# Patient Record
Sex: Male | Born: 1980 | Race: White | Marital: Single | Smoking: Current every day smoker
Health system: Northeastern US, Academic
[De-identification: ages and names within clinical notes are randomized; demographics above are authoritative.]

## PROBLEM LIST (undated history)

## (undated) DIAGNOSIS — R45851 Suicidal ideations: Secondary | ICD-10-CM

## (undated) DIAGNOSIS — F1020 Alcohol dependence, uncomplicated: Secondary | ICD-10-CM

## (undated) DIAGNOSIS — Z91030 Bee allergy status: Secondary | ICD-10-CM

## (undated) DIAGNOSIS — I10 Essential (primary) hypertension: Secondary | ICD-10-CM

## (undated) DIAGNOSIS — F319 Bipolar disorder, unspecified: Secondary | ICD-10-CM

## (undated) HISTORY — PX: TYMPANOPLASTY: SHX33

## (undated) HISTORY — PX: TONSILLECTOMY: SHX28A

## (undated) HISTORY — PX: TONSILLECTOMY: SUR1361

## (undated) NOTE — ED Notes (Signed)
Formatting of this note might be different from the original.  Vol/pending admit to BMU after 8:30 AM  Electronically signed by Haywood Lasso, NT at 02/17/2020  5:19 AM EDT

## (undated) NOTE — ED Notes (Signed)
Formatting of this note might be different from the original.  Awaiting orders for BMU  Electronically signed by Lambert Mody, RN at 02/17/2020  8:47 AM EDT

## (undated) NOTE — Progress Notes (Signed)
Formatting of this note might be different from the original.  Patient can admit to [facility] this evening, pending negative COVID test. Please see BH Assessment note for admission details and number to call for report.    Once COVID test results negative, RN to call [facility] for transportation after 9:30 PM. Number to Kathryne Sharper is [phone]. Sheriff must be told that patient is under IVC.    Blenda Nicely, [place]  Clinical Social Worker  [phone]    Electronically signed by Baldemar Lenis, LCSW at 07/13/2020  4:56 PM EST

## (undated) NOTE — Progress Notes (Signed)
Formatting of this note might be different from the original.  Called and updated mother Steward Drone about patient transfer to floor and restraint use  Electronically signed by Wylie Hail, RN at 07/07/2020  5:29 PM EST

## (undated) NOTE — Unmapped External Note (Signed)
Formatting of this note might be different from the original.    Problem: Safety:  Goal: Non-violent Restraint(s)  Outcome: Progressing    Problem: Education:  Goal: Knowledge of General Education information will improve  Description: Including pain rating scale, medication(s)/side effects and non-pharmacologic comfort measures  Outcome: Progressing    Problem: Health Behavior/Discharge Planning:  Goal: Ability to manage health-related needs will improve  Outcome: Progressing    Problem: Clinical Measurements:  Goal: Ability to maintain clinical measurements within normal limits will improve  Outcome: Progressing  Goal: Will remain free from infection  Outcome: Progressing  Goal: Diagnostic test results will improve  Outcome: Progressing  Goal: Respiratory complications will improve  Outcome: Progressing  Goal: Cardiovascular complication will be avoided  Outcome: Progressing    Problem: Activity:  Goal: Risk for activity intolerance will decrease  Outcome: Progressing    Problem: Nutrition:  Goal: Adequate nutrition will be maintained  Outcome: Progressing    Problem: Coping:  Goal: Level of anxiety will decrease  Outcome: Progressing    Problem: Elimination:  Goal: Will not experience complications related to bowel motility  Outcome: Progressing  Goal: Will not experience complications related to urinary retention  Outcome: Progressing    Problem: Pain Managment:  Goal: General experience of comfort will improve  Outcome: Progressing    Problem: Safety:  Goal: Ability to remain free from injury will improve  Outcome: Progressing    Problem: Skin Integrity:  Goal: Risk for impaired skin integrity will decrease  Outcome: Progressing    Problem: Education:  Goal: Expressions of having a comfortable level of knowledge regarding the disease process will increase  Outcome: Progressing    Problem: Coping:  Goal: Ability to adjust to condition or change in health will improve  Outcome: Progressing  Goal: Ability to  identify appropriate support needs will improve  Outcome: Progressing    Problem: Health Behavior/Discharge Planning:  Goal: Compliance with prescribed medication regimen will improve  Outcome: Progressing    Problem: Medication:  Goal: Risk for medication side effects will decrease  Outcome: Progressing    Problem: Clinical Measurements:  Goal: Complications related to the disease process, condition or treatment will be avoided or minimized  Outcome: Progressing  Goal: Diagnostic test results will improve  Outcome: Progressing    Problem: Safety:  Goal: Verbalization of understanding the information provided will improve  Outcome: Progressing    Problem: Self-Concept:  Goal: Level of anxiety will decrease  Outcome: Progressing  Goal: Ability to verbalize feelings about condition will improve  Outcome: Progressing    Electronically signed by Treasa School, RN at 07/08/2020  9:14 PM EST

## (undated) NOTE — ED Provider Notes (Signed)
Formatting of this note is different from the original.  Emergency Medicine Observation Re-evaluation Note    Jeffrey Reese is a 4 y.o. male, seen on rounds today.  Pt initially presented to the ED for complaints of Suicidal  Currently, the patient is sleeping.    Physical Exam   BP (!) 138/100 (BP Location: Left Arm)   Pulse (!) 106   Temp 98.5 F (36.9 C) (Oral)   Resp 16   SpO2 95%   Physical Exam  General: resting  Cardiac: well perfused  Lungs: spontaneous resp  Psych: calm    ED Course / MDM   EKG:     I have reviewed the labs performed to date as well as medications administered while in observation.  Recent changes in the last 24 hours include none.    Plan   Current plan is for psych/sw/cm dispo.  Patient is under full IVC at this time.    Willy Eddy, MD  02/16/20 [phone]    Electronically signed by Willy Eddy, MD at 02/16/2020  7:19 AM EDT

## (undated) NOTE — Progress Notes (Signed)
Formatting of this note might be different from the original.  Patient's MRSA PCR came back positive. Mupirocin ordered.  Electronically signed by Cyril Loosen, RN at 10/20/2020  5:42 PM EDT

## (undated) NOTE — Progress Notes (Signed)
Formatting of this note might be different from the original.  Pt has been discharged from unit via [employer]. PT has all belongings sent with him and has the disposable scrubs on as well. AVS was given and reviewed with the pt.   Electronically signed by Vanessa Barbara, RN at 07/14/2020 11:12 AM EST

## (undated) NOTE — ED Notes (Signed)
Formatting of this note might be different from the original.  Radiology at bedside.   Electronically signed by Oletta Darter, RN at 10/19/2020  5:09 PM EDT

## (undated) NOTE — ED Notes (Signed)
Formatting of this note might be different from the original.  Pt refused snack.  Electronically signed by Rolla Plate, NT at 02/16/2020  9:16 PM EDT

## (undated) NOTE — ED Notes (Signed)
Formatting of this note might be different from the original.  Poison control called for update, repeat EKG obtained and noted improved QTC. Recommends maybe a CK level, but kidney function is reassuring.   Electronically signed by Forbes Cellar, RN at 07/06/2020  5:32 PM EST

## (undated) NOTE — Unmapped External Note (Signed)
Formatting of this note is different from the original.     Jeffrey Reese is a 76 y.o. male past medical history significant for hypertension, depression/anxiety with history of suicidal ideation, alcohol dependence, tobacco dependence presented with excessive movements of extremities with concern for seizures.  Patient overdosed on unknown amount of Seroquel +/-BuSpar, found facedown at hotel room at approx 0700.  EMS gave Narcan w/no change.  Patient blood pressure has been running high since admission with systolic of 102-152 and diastolic of 80 to 120 mmHg.  Patient is tachycardic with pulse rate ranging from 106-127/minute.  He is afebrile and satting well at room air. CT head negative for acute intracranial pathology.  Ethyl alcohol level is high at 96.  AST and ALT mildly elevated at 53 and 64 respectively.  Acetaminophen level <10.  QTc mildly elevated at 486. Pt has alcohol use disorder and was admitted for alcohol withdrawal recently.    Psych consult placed for suicide attempt. As noted above patient presented yesterday after suicide attempt. He allegedly contacted his grandmother prior to the attempt and told her goodbye.  Patient has a psychiatric history of depression and bipolar, severe alcohol use disorder. His history is notable for DT, most recently as 2020 when he was admitted to [facility] for treatment of DTs. Patient has recent ER visit in 01/2020 where he endorsed suicidal ideations and depression. He has an extensive history of alcohol use for over 20 years drinking about 12-15 beers a day. He is currently receiving services at [facility] outpatient. There are no known records of suicide attempt at this time.     Patient continues to be somnolent and difficult to arouse. He is unable to participate in psychiatric evaluation at this time. Will attempt to reassess patient tomorrow. Patient at high risk for DT's at this time, recommend continue CIWA protocol at this time.     - Due to his overdose on  antipsychotic and Buspar.  will avoid any additional antipsychotics at this time. Patient may require other sedatives and chemical restraints for agitation and aggression.     -Patient with high risk for suicide completion will benefit from inpatient admission for crisis stabilization and medication management.   Patient continues to meet criteria for IVC as he is a risk to self ( suicide attempt).     -Patent attorney.     -Recommend inpatient admission. Work closely with SW to get patient placed at inpatient psych facility once he is medically stable.     -  Electronically signed by Nelly Rout, MD at 07/08/2020 10:27 AM EST    Associated attestation - Nelly Rout, MD - 07/08/2020 10:27 AM EST  Formatting of this note might be different from the original.  Patient needs inpatient psychiatric admission for stabilization and treatment

## (undated) NOTE — ED Notes (Signed)
Formatting of this note might be different from the original.  Unable to sign MSE due to unresponsive.   Electronically signed by Oletta Darter, RN at 10/19/2020  4:31 PM EDT

## (undated) NOTE — Unmapped External Note (Signed)
Formatting of this note is different from the original.  Images from the original note were not included.      NAME:  Jeffrey Reese, MRN:  [id number], DOB:  10-Nov-1980, LOS: 0  ADMISSION DATE:  07/06/2020, CONSULTATION DATE:  07/06/20  REFERRING MD:  Ophelia Charter - [facility] , CHIEF COMPLAINT:  Encephalopathy // intentional overdose     Brief History:   53 yo with MDD + SI in ED following intentional ingestion of 6g of Seroquel.    History of Present Illness:   79 yo M PMH MDD with SI, HTN, EtOH use disorder, Tobacco use disorder. Found down in hotel room 07/06/20 0700, had involuntary movements of extremities, encephalopathy. Per patient's mother, pt left communication indicative of SI, and took 6g Seroquel (possible co BusPar ingestion as well).   Pt with no response to narcan with EMS. In ED, CT H unremarkable for acute intracranial process. Ethyl alcohol 96. APAP < 10. QTc 486. Normotensive, tachycardic with HR 120s    Poison Control engaged - rec IVF, cardiac monitoring, optimize mag and K, avoidance of antipsychotics    Neurology consulted and recommend critical care consultation     Past Medical History:   MDD with SI  EtOH abuse  Tobacco use disorder  HTN     Significant Hospital Events:   1/11 ED for encephalopathy after being found in hotel room following intentional seroquel ingestion (6g) in apparent Suicide attempt. [facility] consulted for admit. Neuro consulted for recs. PCCM consulted     Consults:   PCCM  Neuro    Procedures:     Significant Diagnostic Tests:   1/11 CT H> no acute intracranial abnormality  1/11 EEG >>>     Micro Data:     1/11 SARS Cov2> neg   Antimicrobials:     Interim History / Subjective:   Seen by neurology   [facility] has been able to contact pts mother     Objective    Blood pressure (!) 144/94, pulse (!) 127, temperature (!) 96.7 F (35.9 C), temperature source Axillary, resp. rate 18, height 5\' 10"  (1.778 m), weight 95.3 kg, SpO2 100 %.      No intake or output data in the 24 hours ending 07/06/20  1645  Filed Weights    07/06/20 1259   Weight: 95.3 kg     Examination:  General: Acutely and chronically ill appearing adult M supine NAD   HENT: Nasal trumpet. Tacky mm. Flushed face. Mildly injected sclera, anicteric  Lungs: Symmetrical chest expansion, even. No accessory use. CTAb  Cardiovascular: tachycardic rate reg rhythm cap refill brisk   Abdomen: Soft round + bowel sounds  Extremities: No obvious joint deformity no cyanosis or clubbing no edema  Neuro: Awakens to noxious stimuli. Follows simple commands. Mild hypertonicity BUE. Lower extremity reflexia difficult to discern due to pt spontaneous movement. Protecting airway. PERRL 3mm. Akasthesias  GU: condom cath     [facility] Problem list      Assessment & Plan:     MDD with SI  -home meds: depakote ER 500mg , buspar 10mg  BID, lexapro 20mg , seroquel 200mg   P  -holding home meds  -needs psych consult    Acute toxic metabolic encephalopathy in setting of intentional overdose:  Seroquel overdose, intentional  -is possible that pt also ingested other home meds   -sx mostly c/w EPS -- not currently c/w NMS  -not c/w anticholinergic tox, serotonin syndrome at this time  P  -poison control engaged in  ED  -Neuro consulted, recs are pending  -cont IVF -- I have ordered additional bolus but anticipate pt will need more aggressive ongoing IVF  -cardiac monitoring, cont QTc monitoring, monitor for airway protection, serial ECGs   -optimize mag, K, calcium -- I will add on an ionized cal to these labs  -check CK, LDH  -benadryl PRN for extrapyramidal symptoms   -monitor for s/sx NMS  -check VPA level   -if QRS prolonged, could consider bicarb gtt, but would rec discussing with poison control first     Bibasilar pulmonary opacities  -infiltrates vs atelectasis on CXR  -clinically, very possible that pt aspirated -- rec short course of empiric abx     Transaminitis, mild  -in setting of etoh use disorder vs overdose above  P  -trend LFTs    EtOH abuse  disorder  P  -CIWA  -cessation counseling when appropriate     Thank you for consulting critical care. At this time the patient remains stable for non-ICU admission. PCCM will sign off. Please re-engage if we can be of further assistance or if patient's clinical status changes.    Best practice (evaluated daily)   Diet: NPO  Pain/Anxiety/Delirium protocol (if indicated): na  VAP protocol (if indicated): na  DVT prophylaxis: lovenox  GI prophylaxis: na  Glucose control: SSI  Mobility: BR  Disposition:SDU     Goals of Care:   Last date of multidisciplinary goals of care discussion:--  Family and staff present: --  Summary of discussion: --  Follow up goals of care discussion due: --  Code Status: Full    Labs    CBC:  Recent Labs   Lab 07/06/20  1307 07/06/20  1438   WBC 9.5  --    NEUTROABS 6.6  --    HGB 14.8 15.0   HCT 44.3 44.0   MCV 93.9  --    PLT 166  --      Basic Metabolic Panel:  Recent Labs   Lab 07/06/20  1307 07/06/20  1438   NA 135 135   K 3.8 3.6   CL 98  --    CO2 23  --    GLUCOSE 144*  --    BUN 6  --    CREATININE 0.72  --    CALCIUM 9.2  --      GFR:  Estimated Creatinine Clearance: 143.6 mL/min (by C-G formula based on SCr of 0.72 mg/dL).  Recent Labs   Lab 07/06/20  1307   WBC 9.5     Liver Function Tests:  Recent Labs   Lab 07/06/20  1307   AST 70*   ALT 70*   ALKPHOS 67   BILITOT 1.2   PROT 6.9   ALBUMIN 3.8     No results for input(s): LIPASE, AMYLASE in the last 168 hours.  No results for input(s): AMMONIA in the last 168 hours.    ABG    Component Value Date/Time    PHART 7.343 (L) 07/06/2020 1438    PCO2ART 43.0 07/06/2020 1438    PO2ART 73 (L) 07/06/2020 1438    HCO3 23.5 07/06/2020 1438    TCO2 25 07/06/2020 1438    ACIDBASEDEF 3.0 (H) 07/06/2020 1438    O2SAT 94.0 07/06/2020 1438       Coagulation Profile:  No results for input(s): INR, PROTIME in the last 168 hours.    Cardiac Enzymes:  No results for input(s): CKTOTAL, CKMB, CKMBINDEX, TROPONINI in  the last 168 hours.    HbA1C:  No  results found for: HGBA1C    CBG:  No results for input(s): GLUCAP in the last 168 hours.    Review of Systems:    Unable to obtain due to encephalopathy     Past Medical History:   He,  has a past medical history of Alcohol dependence (HCC), Bee sting allergy, Hypertension, and Suicidal ideation.     Surgical History:     Past Surgical History:   Procedure Laterality Date   ? TONSILLECTOMY         Social History:    reports that he has been smoking. He has never used smokeless tobacco. He reports current alcohol use of about 24.0 standard drinks of alcohol per week. He reports that he does not use drugs.     Family History:   His family history is not on file.     Allergies  Allergies   Allergen Reactions   ? Bee Venom Anaphylaxis       Home Medications   Prior to Admission medications    Medication Sig Start Date End Date Taking? Authorizing Provider   amLODipine (NORVASC) 10 MG tablet Take 10 mg by mouth daily.    [provider]   busPIRone (BUSPAR) 10 MG tablet Take 10 mg by mouth 2 (two) times daily. 06/17/20   [provider]   divalproex (DEPAKOTE ER) 500 MG 24 hr tablet Take 500 mg by mouth at bedtime. 06/17/20   [provider]   EPINEPHrine 0.3 mg/0.3 mL IJ SOAJ injection Inject 0.3 mg into the muscle as needed for anaphylaxis.    [provider]   escitalopram (LEXAPRO) 20 MG tablet Take 1 tablet by mouth daily.  06/27/18   [provider]   lisinopril (ZESTRIL) 10 MG tablet Take 1 tablet by mouth daily. 04/16/18   [provider]   naproxen (NAPROSYN) 500 MG tablet Take 1 tablet (500 mg total) by mouth 2 (two) times daily with a meal. 12/09/19   Joni Reining, PA-C   QUEtiapine (SEROQUEL) 200 MG tablet Take 200 mg by mouth at bedtime. 06/17/20   [provider]       Tessie Fass MSN, AGACNP-BC  [facility]  [phone]  If no answer, [phone]  07/06/2020, 5:25 PM      Electronically signed by Karren Burly, MD at 07/06/2020  6:23 PM EST    Associated attestation - Judeth Horn Lesia Sago, MD - 07/06/2020  6:23 PM EST  Formatting of this note might be different from the original.  Critical care attending attestation note:    Patient seen and examined and relevant ancillary tests reviewed.  I agree with the assessment and plan of care as outlined by Tessie Fass, NP. The following reflects my independent care time.    13 year old presumed intentional seroquel and buspar OD whom we are seeing to evaluate mental status if protecting airway.    Follows commands with coaching. Attempts to communicate, speech garbled. Appears to be protecting airway at current time.    BP (!) 144/94   Pulse (!) 127   Temp (!) 96.7 F (35.9 C) (Axillary) Comment (Src): patient responded to probe under arm.   Resp 18   Ht 5\' 10"  (1.778 m)   Wt 95.3 kg   SpO2 100%   BMI 30.13 kg/m   General: disheveled, in mild distress  Ext: multiple abrasions, bruises  CV: tachy,  no murmur  Pulm:CTAB, NWOB on RA  Neuro: follows commands, agitated, dysarthric speech    Synopsis of assessment and plan:  Suicide attempt: Buspar and seroquel empty bottles per report  --Psych c/s    Acute toxic metabolic encephalopathy: In setting of OD, EtOH present on blood work, UDS not collected.  --Continue poison control consultation  --Monitor for Qtc prolongation  --optimize K, Mag, Ca  -if QRS prolonged, could consider bicarb gtt, but would rec discussing with poison control first     Rest per note below.    CRITICAL CARE  N/a  .    07/06/2020, 6:05 PM

## (undated) NOTE — ED Notes (Signed)
Formatting of this note might be different from the original.  Pt states his last alcoholic drink (beer) was just before he came in (he arrived at 10 am).  He reports drinking daily.  He states he requires at least a six pack every day to keep from going into withdrawals.    Electronically signed by Ancil Boozer, RN at 02/15/2020  6:38 PM EDT

## (undated) NOTE — Unmapped External Note (Signed)
Formatting of this note is different from the original.  [facility] Face-to-Face Psychiatry Consult     Reason for Consult: Suicide attempt  Referring Physician:  Dr. Ella Jubilee  Patient Identification: Jeffrey Reese  MRN:  [id number]  Principal Diagnosis: Intentional overdose of drug in tablet form [facility])  Diagnosis:  Principal Problem:    Intentional overdose of drug in tablet form (HCC)  Active Problems:    Major depressive disorder, recurrent severe without psychotic features (HCC)    Alcohol use disorder, severe, dependence (HCC)    Essential hypertension    Tobacco dependence    Class 1 obesity due to excess calories with body mass index (BMI) of 30.0 to 30.9 in adult    Total Time spent with patient: 45 minutes    Subjective:    Jeffrey Reese is a 67 y.o. male patient admitted with suicide attempt.  Patient seen and evaluated by this provider.  He presented to the emergency department, after being found down at the hotel.  Patient endorses feelings of sadness, hopelessness, helplessness, and worthlessness " I do not have anything for myself.  It is a bunch of chaos.  I have been through multiple jobs, failed relationships, it is my father's 1 year anniversary of his death, homeless, and no car."  Patient reports his depression is worse with increase in alcohol use, which led to his most recent suicide attempt.  He states after being intoxicated, he decided to take between" 25-30 buspirone, and took 2 Seroquel tablets.  I took the Seroquel as prescribed.  And I just said fuck it."  When assessing for psychiatric history he reports a diagnosis of bipolar manic depression.  He reports he is [facility] with [name], and receiving therapeutic services from [facility].  He reports numerous attempts at suicide " too many to count".  He reports 2 previous suicide attempts by overdose that resulted in medical intervention.  He reports 5-6 previous inpatient hospitalizations,  with the last hospitalization being in August 2021 where he was admitted inpatient in the [place].  He continues to endorse suicidal thoughts at this time.  Patient continues to have morbid depressive symptoms, with no support system, and remains hopeless we will continue to recommend inpatient admission at this time.    HPI: Jeffrey Reese a 39 y.o.malepast medical history significant for hypertension, depression/anxiety with history of suicidal ideation, alcohol dependence, tobacco dependence presented with excessive movements of extremitieswithconcern for seizures.Patient overdosed on unknown amount of Seroquel +/-BuSpar,found facedown at hotel room atapprox 0700.EMS gave Narcan w/nochange.Patient blood pressure has been running high since admission with systolic of 102-152 and diastolic of 80 to 120 mmHg.Patient is tachycardic with pulse rate ranging from 106-127/minute.He is afebrile and satting well at room air.CT head negative for acute intracranial pathology.Ethyl alcohol level is high at 96.AST andALT mildly elevated at 53 and 64 respectively.Acetaminophen level <10.QTc mildly elevated at 486. Pt has alcohol use disorder and was admitted for alcohol withdrawal recently.    Past Psychiatric History: See above  Legal charges: Pending for assault by pointing a weapon, carrying concealed weapon with no license, carrying concealed weapon while intoxicated with no license, DWI, communicating threat, open container, and driving without taillight.     Risk to Self:  Yes  Risk to Others:  No  Prior Inpatient Therapy:  Multiple inpatient psychiatric [facility], [facility]  Prior Outpatient Therapy:  [facility] [place].  Past Medical History:   Past Medical History:   Diagnosis Date   ? Alcohol dependence (HCC)    ? 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 Hypertension    ? Suicidal ideation      Past Surgical  History:   Procedure Laterality Date   ? TONSILLECTOMY       Family History: No family history on file.  Family Psychiatric  History: As per patient father diagnosed with" paranoid delusion disorder."   Patient also reports family history maternal and paternal of depression.  Social History:   Social History     Substance and Sexual Activity   Alcohol Use Yes   ? Alcohol/week: 24.0 standard drinks   ? Types: 24 Cans of beer per week     Social History     Substance and Sexual Activity   Drug Use No     Social History     Socioeconomic History   ? Marital status: Single     Spouse name: Not on file   ? Number of children: Not on file   ? Years of education: Not on file   ? Highest education level: Not on file   Occupational History   ? Not on file   Tobacco Use   ? Smoking status: Current Every Day Smoker   ? Smokeless tobacco: Never Used   Substance and Sexual Activity   ? Alcohol use: Yes     Alcohol/week: 24.0 standard drinks     Types: 24 Cans of beer per week   ? Drug use: No   ? Sexual activity: Not on file   Other Topics Concern   ? Not on file   Social History Narrative   ? Not on file     Social Determinants of Health     Financial Resource Strain: Not on file   Food Insecurity: Not on file   Transportation Needs: Not on file   Physical Activity: Not on file   Stress: Not on file   Social Connections: Not on file     Additional Social History:      Allergies:    Allergies   Allergen Reactions   ? Bee Venom Anaphylaxis     Labs:   Results for orders placed or performed during the hospital encounter of 07/06/20 (from the past 48 hour(s))   Valproic acid level     Status: Abnormal    Collection Time: 07/07/20  4:30 PM   Result Value Ref Range    Valproic Acid Lvl <10 (L) 50.0 - 100.0 ug/mL     Comment: RESULTS CONFIRMED BY MANUAL DILUTION  Performed at [facility], [address], [place], [place] [address]    Comprehensive metabolic panel     Status: Abnormal    Collection Time: 07/08/20  3:24 AM    Result Value Ref Range    Sodium 137 135 - 145 mmol/L    Potassium 3.3 (L) 3.5 - 5.1 mmol/L    Chloride 101 98 - 111 mmol/L    CO2 24 22 - 32 mmol/L    Glucose, Bld 96 70 - 99 mg/dL     Comment: Glucose reference range applies only to samples taken after fasting for at least 8 hours.    BUN <5 (L) 6 - 20 mg/dL    Creatinine, Ser 1.19 0.61 - 1.24 mg/dL    Calcium 8.9 8.9 - 14.7 mg/dL  Total Protein 6.1 (L) 6.5 - 8.1 g/dL    Albumin 3.3 (L) 3.5 - 5.0 g/dL    AST 65 (H) 15 - 41 U/L    ALT 69 (H) 0 - 44 U/L    Alkaline Phosphatase 64 38 - 126 U/L    Total Bilirubin 1.4 (H) 0.3 - 1.2 mg/dL    GFR, Estimated >16 >10 mL/min     Comment: (NOTE)  Calculated using the CKD-EPI Creatinine Equation (2021)     Anion gap 12 5 - 15     Comment: Performed at [facility], [address], [place], [place] [address]   Basic metabolic panel     Status: Abnormal    Collection Time: 07/09/20  4:13 AM   Result Value Ref Range    Sodium 139 135 - 145 mmol/L    Potassium 3.9 3.5 - 5.1 mmol/L    Chloride 105 98 - 111 mmol/L    CO2 24 22 - 32 mmol/L    Glucose, Bld 110 (H) 70 - 99 mg/dL     Comment: Glucose reference range applies only to samples taken after fasting for at least 8 hours.    BUN 7 6 - 20 mg/dL    Creatinine, Ser 4.09 0.61 - 1.24 mg/dL    Calcium 9.2 8.9 - 81.1 mg/dL    GFR, Estimated >91 >47 mL/min     Comment: (NOTE)  Calculated using the CKD-EPI Creatinine Equation (2021)     Anion gap 10 5 - 15     Comment: Performed at [facility], [address], [place], [place] [address]   Magnesium     Status: None    Collection Time: 07/09/20  4:13 AM   Result Value Ref Range    Magnesium 2.2 1.7 - 2.4 mg/dL     Comment: Performed at [facility], [address] [place], [place] [address]     Current Facility-Administered Medications   Medication Dose Route Frequency Provider Last Rate Last Admin   ? acetaminophen (TYLENOL) tablet 650 mg  650 mg Oral Q6H PRN Jonah Blue, MD        Or   ? acetaminophen  (TYLENOL) suppository 650 mg  650 mg Rectal Q6H PRN Jonah Blue, MD       ? amLODipine (NORVASC) tablet 10 mg  10 mg Oral Daily Coralie Keens, MD   10 mg at 07/09/20 6578   ? bisacodyl (DULCOLAX) EC tablet 5 mg  5 mg Oral Daily PRN Jonah Blue, MD       ? dextrose 5 % in lactated ringers infusion   Intravenous Continuous Arrien, York Ram, MD 75 mL/hr at 07/08/20 2240 New Bag at 07/08/20 2240   ? enoxaparin (LOVENOX) injection 40 mg  40 mg Subcutaneous Q24H Jonah Blue, MD   40 mg at 07/08/20 1704   ? [START ON 07/10/2020] folic acid (FOLVITE) tablet 1 mg  1 mg Oral Daily Arrien, York Ram, MD       ? hydrALAZINE (APRESOLINE) injection 5 mg  5 mg Intravenous Q4H PRN Jonah Blue, MD   5 mg at 07/07/20 2042   ? lisinopril (ZESTRIL) tablet 10 mg  10 mg Oral Daily Arrien, York Ram, MD   10 mg at 07/09/20 0956   ? LORazepam (ATIVAN) injection 0-4 mg  0-4 mg Intravenous Bobette Mo, MD       ? LORazepam (ATIVAN) tablet 1-4 mg  1-4 mg Oral Q1H PRN  Jonah Blue, MD        Or   ? LORazepam (ATIVAN) injection 1-4 mg  1-4 mg Intravenous Q1H PRN Jonah Blue, MD   2 mg at 07/07/20 2254   ? morphine 2 MG/ML injection 2 mg  2 mg Intravenous Q2H PRN Jonah Blue, MD   2 mg at 07/08/20 0158   ? multivitamin with minerals tablet 1 tablet  1 tablet Oral Daily Jonah Blue, MD   1 tablet at 07/09/20 1610   ? naproxen (NAPROSYN) tablet 500 mg  500 mg Oral BID WC Coralie Keens, MD   500 mg at 07/09/20 0956   ? nicotine (NICODERM CQ - dosed in mg/24 hours) patch 14 mg  14 mg Transdermal q1800 Jonah Blue, MD   14 mg at 07/08/20 1705   ? ondansetron (ZOFRAN) tablet 4 mg  4 mg Oral Q6H PRN Jonah Blue, MD        Or   ? ondansetron [facility]) injection 4 mg  4 mg Intravenous Q6H PRN Jonah Blue, MD       ? polyethylene glycol (MIRALAX / GLYCOLAX) packet 17 g  17 g Oral Daily PRN Jonah Blue, MD       ? sodium chloride flush (NS) 0.9 % injection 3 mL  3  mL Intravenous Q12H Jonah Blue, MD   3 mL at 07/09/20 9604     Musculoskeletal:  Strength & Muscle Tone: decreased  Gait & Station: normal  Patient leans: N/A    Psychiatric Specialty Exam:  Physical Exam  Vitals and nursing note reviewed.   Constitutional:       Appearance: Normal appearance.   HENT:      Head: Normocephalic.      Nose: Nose normal.   Musculoskeletal:         General: Normal range of motion.      Cervical back: Normal range of motion.   Neurological:      General: No focal deficit present.      Mental Status: He is alert and oriented to person, place, and time.   Psychiatric:         Attention and Perception: Perception normal. He is inattentive.         Mood and Affect: Mood is anxious and depressed.         Speech: Speech normal.         Behavior: Behavior normal. Behavior is cooperative.         Thought Content: Thought content includes suicidal ideation. Thought content includes suicidal plan.         Cognition and Memory: Cognition and memory normal.         Judgment: Judgment normal.       Review of Systems   Psychiatric/Behavioral: Positive for dysphoric mood and suicidal ideas. The patient is nervous/anxious.    All other systems reviewed and are negative.    Blood pressure (!) 139/105, pulse 100, temperature 98.6 F (37 C), temperature source Oral, resp. rate 20, height 5\' 10"  (1.778 m), weight 95.3 kg, SpO2 98 %.Body mass index is 30.13 kg/m.   General Appearance: Fairly Groomed   Eye Contact:  Fair   Speech:  Clear and Coherent and Normal Rate   Volume:  Normal   Mood:  Depressed, " matter of fact"   Affect:  Blunt and Depressed   Thought Process:  Linear and Descriptions of Associations: Circumstantial   Orientation:  Full (Time, Place, and Person)   Thought Content:  Logical   Suicidal Thoughts:  Yes.  with intent/plan   Homicidal Thoughts:  No   Memory:  Immediate;   Fair  Recent;   Fair  Remote;   Fair   Judgement:  Poor   Insight:  Shallow   Psychomotor Activity:  Psychomotor  Retardation   Concentration:  Concentration: Fair and Attention Span: Fair   Recall:  [employer] of Knowledge:  Fair   Language:  Good   Akathisia:  No   Handed:  Right   AIMS (if indicated):      Assets:  Housing  Leisure Time  Physical Health  Resilience   ADL's:  Intact   Cognition:  WNL   Sleep:        Treatment Plan Summary:  Plan Continue current medications.  Recommend working closely with social work to facilitate inpatient psychiatric admission.  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   -Continue CIWA detox protocol  -Patient reports two pack per day smoking history.  Increase nicotine patch to 21 mg.  -Continue IVC due to suicide attempt.  There continues to be ongoing safety concerns therefore he continues to meet criteria.    Alcohol use disorder, severe:  -Ativan alcohol detox started    Disposition: Recommend psychiatric Inpatient admission when medically cleared.    Maryagnes Amos, FNP  07/09/2020 11:56 AM  Electronically signed by Nelly Rout, MD at 07/10/2020 10:27 AM EST    Associated attestation - Nelly Rout, MD - 07/10/2020 10:27 AM EST  Formatting of this note might be different from the original.  Patient needs inpatient psychiatric care for stabilization and treatment, to continue IVC as patient did attempt suicide

## (undated) NOTE — ED Notes (Signed)
Formatting of this note might be different from the original.  Pt reports he received a call that his landlord is evicting him. Pt is asking to be discharged. Pt reports he wants the help but he cannot stay here and end up losing all his stuff. Requesting to speak with psych MD, advised writer will let MD know. He verbalized understanding.   Electronically signed by Lambert Mody, RN at 02/17/2020 11:30 AM EDT

## (undated) NOTE — Progress Notes (Signed)
Formatting of this note might be different from the original.  This RN received a phone call from Officer [name] that pt will not be picked up tonight, rather pt will be [facility] as the facility does not accept patients later than 7pm in [facility], same related to pt's RN Darral Dash, IVC papers in pt's chart. [place], Philomena Efe      Electronically signed by Iran Planas, RN at 07/13/2020 11:10 PM EST

## (undated) NOTE — Progress Notes (Signed)
Formatting of this note might be different from the original.  Pt agitated and expresses concern that he wants to leave. Nurse educated pt that he cannot leave he is waiting on bed placement at [facility]. Pt states he understands and is visibly upset.    Electronically signed by Demetrio Lapping, LPN at 16/03/9603  6:18 PM EDT

## (undated) NOTE — ED Triage Notes (Signed)
Formatting of this note might be different from the original.  Pt presents via [place] with c/o SI. Reports wants to kill himself. Pt denies details of having a plan 'because im not in my kitchen" Pt voluntary.   Electronically signed by Leana Roe, RN at 02/15/2020 10:21 AM EDT

## (undated) NOTE — ED Notes (Signed)
Formatting of this note might be different from the original.  [employer] called by Gillian Shields PA.   Electronically signed by Oletta Darter, RN at 10/19/2020  5:07 PM EDT

## (undated) NOTE — Unmapped External Note (Signed)
Formatting of this note might be different from the original.  Patient can come after 9:30pm pending negative COVID    Please be sure to discharge pt before arrival     Patient has been accepted to [facility]   Accepting physician is Dr. Neale Burly.   Attending Physician will be Dr. Neale Burly.   Patient has been assigned to room 320, by [facility] Charge Nurse [place].    Call report to [phone].   Representative/Transfer Coordinator is [place]   Patient pre-admitted by [facility])    [facility] ER Staff Caryn Bee, FNP & Josie Dixon., LCSW ) made aware of acceptance.    Electronically signed by Opal Sidles, LCSWA at 07/13/2020  3:39 PM EST

## (undated) NOTE — ED Notes (Signed)
Formatting of this note might be different from the original.  [place] [employer] at bedside, states was called out for overdose from girlfriend. Pt had admitted to taking Kratom, drinking beer and taking Seroquel. Pt was uncooperative at the house and not wanting to come to ED for evaluation, [employer] was going to bring pt here for emergency commitment and went to place cuff on patient, patient went unresponsive.   Electronically signed by Oletta Darter, RN at 10/19/2020  5:30 PM EDT

## (undated) NOTE — Progress Notes (Signed)
Formatting of this note is different from the original.  Images from the original note were not included.    PROGRESS NOTE    Jeffrey Reese  LKG:[id number] DOB: 01/31/1981 DOA: 07/06/2020  PCP: Patient, No Pcp Per     Brief Narrative:   [name] was admitted to the hospital with a working diagnosis of intentional overdose with quetiapine/ buspirone, complicated with extrapyramidal and anticholinergic syndrome.    4 year old male with past medical history for hypertension, depression, anxiety, alcohol abuse and tobacco dependence who presents after an intentional overdose.  He attempted suicide via overdose on 01/09, he called his mother to say goodbye.  The following day he was delusional and disoriented.  01/11 he was found down at the hotel room around 7 in the morning, apparently overdosed with unknown amount of quetiapine +/-buspiron he he received naloxone by EMS with no significant change.  He was brought to the hospital for further evaluation.  On his initial physical examination blood pressure 123/112, heart rate 123, respirate 16, oxygen saturation 99%, lungs clear to auscultation bilaterally, heart S1-S2, present, tachycardic, abdomen soft, no lower extremity edema.    Chest radiograph with bibasilar atelectasis, no frank infiltrates.     EKG 120 bpm, normal axis, normal intervals, sinus rhythm, no st segment or T wave changes.     Assessment & Plan:    Principal Problem:    Intentional overdose of drug in tablet form (HCC)  Active Problems:    Major depressive disorder, recurrent severe without psychotic features (HCC)    Alcohol use disorder, severe, dependence (HCC)    Essential hypertension    Tobacco dependence    Class 1 obesity due to excess calories with body mass index (BMI) of 30.0 to 30.9 in adult      1. Suicidal attempt, quetiapine and buspirone overdose, complicated with extrapyramidal syndrome and anticholinergic syndrome. EEG with no seizures.   This am is more awake and alert,  continue to have tremors and dyskinesia but improved from yesterday. Patient is under IVC.     Attempt to remove physical restrains and advance diet to regular.   Continue close neuro checks and one to one precautions, due to suicidal attempt.     Continue with IV fluids with dextrose, continue with multivitamins including thiamine (high dose).     Psychiatry has recommended inpatient psych, will follow with social work for placement.   For now will hold on divalproex, buspirone, escitalopram and quetiapine.     2. HTN. Blood pressure 155/107 mmHg. Patient has been on 4 point restrains.  At home patient on lisinopril and amlodipine that will be resumed. Continue blood pressure monitoring.     3. Tobacco and alcohol dependence.   Mentation has improved, positive tremors in his hands.   LFT's with liver enzymes trending down.    Neuro checks q 4 h. Continue with needed lorazepam per CIWA protocol.   Advance diet, continue vitamins and thiamine.     4. Hypomagnesemia/ hypokalemia. Renal function stable with serum cr at 0,63, K is 3,3 and bicarbonate at 24.  Continue hydration with balanced electrolyte solutions with dextrose. Add 40 meq Kcl IV and follow up renal panel in am.     5. Obesity type 1/ chronic pain. Calculated BMI is 30,1. Continue to be at high risk for inpatient complications.  Consult PT and OT   Resume naproxen and continue with as needed morphine.     Patient continue to be at high risk  for worsening encephalopathy     Status is: Inpatient    Remains inpatient appropriate because:IV treatments appropriate due to intensity of illness or inability to take PO    Dispo: The patient is from: Home               Anticipated d/c is to: Inpatient psych                Anticipated d/c date is: 2 days               Patient currently is not medically stable to d/c.    DVT prophylaxis: Enoxaparin    Code Status:   full   Family Communication:  I spoke over the phone with the patient's mother about patient's   condition, plan of care, prognosis and all questions were addressed.    Consultants:    Neurology    Psychiatry         Subjective:  Patient is more awake today, no nausea or vomiting, positive appetite and asking for nicotine patch.     Objective:  Vitals:    07/08/20 0000 07/08/20 0430 07/08/20 0805 07/08/20 1151   BP: 133/87 103/74 (!) 155/107 (!) 148/105   Pulse: (!) 110 (!) 101 98 100   Resp: 20  20 20    Temp: 98 F (36.7 C) 98.2 F (36.8 C) 98.1 F (36.7 C) 98.1 F (36.7 C)   TempSrc: Oral Oral Oral Oral   SpO2: 99% 98% 98% 98%   Weight:       Height:         Intake/Output Summary (Last 24 hours) at 07/08/2020 1246  Last data filed at 07/08/2020 0600  Gross per 24 hour   Intake 898.52 ml   Output 1650 ml   Net -751.48 ml     Filed Weights    07/06/20 1259   Weight: 95.3 kg     Examination:     General: Not in pain or dyspnea, deconditioned   Neurology: Awake and alert, non focal, he is orientated times place and person. Positive tremors bilateral hands at rest. Mild dyskinesia.   E ENT: no pallor, no icterus, oral mucosa moist  Cardiovascular: No JVD. S1-S2 present, rhythmic, no gallops, rubs, or murmurs. No lower extremity edema.  Pulmonary: positive breath sounds bilaterally, with no wheezing, rhonchi or rales.  Gastrointestinal. Abdomen soft and non tender  Skin. Multiple ecchymosis upper and lower extremities.   Musculoskeletal: no joint deformities    Data Reviewed: I have personally reviewed following labs and imaging studies    CBC:  Recent Labs   Lab 07/06/20  1307 07/06/20  1438 07/07/20  0500   WBC 9.5  --  9.9   NEUTROABS 6.6  --   --    HGB 14.8 15.0 13.5   HCT 44.3 44.0 40.4   MCV 93.9  --  94.4   PLT 166  --  169     Basic Metabolic Panel:  Recent Labs   Lab 07/06/20  1307 07/06/20  1438 07/07/20  0500 07/07/20  0533 07/08/20  0324   NA 135 135 138  --  137   K 3.8 3.6 4.0  --  3.3*   CL 98  --  106  --  101   CO2 23  --  22  --  24   GLUCOSE 144*  --  93  --  96   BUN 6  --  <5*  --  <5*  CREATININE 0.72  --  0.75  --  0.63   CALCIUM 9.2  --  8.4*  --  8.9   MG  --   --   --  1.7  --      GFR:  Estimated Creatinine Clearance: 143.6 mL/min (by C-G formula based on SCr of 0.63 mg/dL).  Liver Function Tests:  Recent Labs   Lab 07/06/20  1307 07/08/20  0324   AST 70* 65*   ALT 70* 69*   ALKPHOS 67 64   BILITOT 1.2 1.4*   PROT 6.9 6.1*   ALBUMIN 3.8 3.3*     No results for input(s): LIPASE, AMYLASE in the last 168 hours.  No results for input(s): AMMONIA in the last 168 hours.  Coagulation Profile:  No results for input(s): INR, PROTIME in the last 168 hours.  Cardiac Enzymes:  Recent Labs   Lab 07/07/20  0533   CKTOTAL 804*     BNP (last 3 results)  No results for input(s): PROBNP in the last 8760 hours.  HbA1C:  No results for input(s): HGBA1C in the last 72 hours.  CBG:  Recent Labs   Lab 07/06/20  1304   GLUCAP 138*     Lipid Profile:  No results for input(s): CHOL, HDL, LDLCALC, TRIG, CHOLHDL, LDLDIRECT in the last 72 hours.  Thyroid Function Tests:  Recent Labs     07/07/20  0533   TSH 1.138     Anemia Panel:  No results for input(s): VITAMINB12, FOLATE, FERRITIN, TIBC, IRON, RETICCTPCT in the last 72 hours.    Radiology Studies:  I have reviewed all of the imaging during this hospital visit personally    Scheduled Meds:  ? enoxaparin (LOVENOX) injection  40 mg Subcutaneous Q24H   ? folic acid  1 mg Intravenous Daily   ? LORazepam  0-4 mg Intravenous Q4H    Followed by   ? LORazepam  0-4 mg Intravenous Q8H   ? multivitamin with minerals  1 tablet Oral Daily   ? nicotine  14 mg Transdermal q1800   ? sodium chloride flush  3 mL Intravenous Q12H     Continuous Infusions:  ? dextrose 5% lactated ringers 75 mL/hr at 07/08/20 0824   ? thiamine injection 500 mg (07/08/20 1112)      LOS: 2 days     Mauricio Annett Gula, MD      Electronically signed by Coralie Keens, MD at 07/08/2020  1:03 PM EST

## (undated) NOTE — Progress Notes (Signed)
Formatting of this note might be different from the original.  5pm and 5:30pm patient in room, cooperative,awake, and hands visible.   Electronically signed by Raelyn Number, CMA at 10/20/2020  6:05 PM EDT

## (undated) NOTE — ED Triage Notes (Signed)
Formatting of this note might be different from the original.  Arrived via EMS; found face down in a hotel room at approx 0700, reported taking unknown amount of Seroquel d/t depression and argument with significant other, unknown other substance use. EMS endorsed given narcan w/ no change. Patient arrived in ED w/ NPA in placed; spontaneous respiration. RA Sp02 100%; Responds to pain,   Electronically signed by Forbes Cellar, RN at 07/06/2020 12:56 PM EST

## (undated) NOTE — Progress Notes (Signed)
Formatting of this note might be different from the original.  RN attempted to call for report but receiving facility needs night shift to accept report. RN called sheriffs office and left voicemail with callback number to the unit.  Electronically signed by Vanessa Barbara, RN at 07/13/2020  7:40 PM EST

## (undated) NOTE — Progress Notes (Signed)
Formatting of this note is different from the original.  Images from the original note were not included.    PROGRESS NOTE    Jeffrey Reese  GNF:[id number] DOB: [birth year] DOA: 07/06/2020  PCP: Jeffrey Reese     Brief Narrative:   Jeffrey Reese admitted to the hospital with a working diagnosis of intentional overdose with quetiapine/ buspirone,complicated withextrapyramidal andanticholinergicsyndrome.    66 year old male with past medical history for hypertension, depression, anxiety, alcohol abuse and tobacco dependence who presents after an intentional overdose. He attempted suicide via overdose on 01/09,he called his mother to say goodbye. The following day he was delusional and disoriented.  01/11he was found down at the hotel room around 7 in the morning, apparently overdosed with unknown amount of quetiapine +/-buspiron he he received naloxone by EMS with no significant change. He was brought to the hospital for further evaluation.  On his initial physical examination blood pressure 123/112, heart rate 123, respirate 16, oxygen saturation 99%, lungs clear to auscultation bilaterally, heart S1-S2, present, tachycardic, abdomen soft, no lower extremity edema.    Chest radiograph with bibasilar atelectasis, no frank infiltrates.     EKG 120 bpm, normal axis, normal intervals, sinus rhythm, no st segment or T wave changes.    Patient placed under supportive medical care with slow improvement of his symptoms.   Initially required 4 point restrains.     Psychiatry consulted and recommendations for inpatient psych. Patient is involuntary committed due to high suicidal risk.     Patient is now medically stable for transfer.     Assessment & Plan:    Principal Problem:    Intentional overdose of drug in tablet form (HCC)  Active Problems:    Major depressive disorder, recurrent severe without psychotic features (HCC)    Alcohol use disorder, severe, dependence (HCC)    Essential hypertension     Tobacco dependence    Class 1 obesity due to excess calories with body mass index (BMI) of 30.0 to 30.9 in adult    1. Suicidal attempt, quetiapine and buspirone overdose, complicated with extrapyramidal syndrome and anticholinergic syndrome.EEG with no seizures.   Brain MRI with no acute changes, questionable changes in T2 signal in the medial thalami to hypothalamic region, can indicate Wernicke encephalopathy.     Patient at home on divalproex, buspirone, escitalopramand quetiapine.     Medically patient is stable, mild tremors bilateral hands, but no anxiety or agitation, no confusion.  Continue with suicidal precautions, one to one sitter.  Psychiatry has recommended inpatient psych, patient continue IVC.     2. HTN.Continue with amlodipine and lisinopril for blood pressure control    3. Tobacco and alcohol dependence.  No clinical signs of active withdrawal, will continue with as needed oral lorazepam.  Continue with multivitamins and thiamine.    4. Hypomagnesemia/ hypokalemia.resolved, patient is tolerating po well.     5. Obesity type 1/ chronic pain/ right shoulder pain, suspected rotator cuff tear. Calculated BMI is 30,1.  Pain control with naproxen, acetaminophen and will add lidocaine patch. Not significant pain control with diclofenac, that will be discontinued/   Discontinue IV morphine.     Status is: Inpatient    Remains inpatient appropriate because:Unsafe d/c plan    Dispo: The patient is from: Home               Anticipated d/c is to: Inpatient pscyhiatry  Anticipated d/c date is: 1 day               Patient currently is medically stable to d/c.    DVT prophylaxis: Enoxaparin    Code Status:   full   Family Communication:  No family at the bedside      Consultants:    Neurology    Psychiatry     Subjective:  Patient is feeling better, continue to have right shoulder pain, no nausea or vomiting, no dyspnea or chest pain.    Objective:  Vitals:    07/09/20 1608 07/09/20  2012 07/10/20 0408 07/10/20 0801   BP: (!) 139/99 (!) 160/110 (!) 144/101 (!) 144/106   Pulse: (!) 102 99 87 92   Resp: 18 18 18 18    Temp: 98 F (36.7 C) 98.5 F (36.9 C) 98.4 F (36.9 C) 97.9 F (36.6 C)   TempSrc: Oral Oral Oral Oral   SpO2: 98% 100% 98% 100%   Weight:       Height:         Intake/Output Summary (Last 24 hours) at 07/10/2020 0857  Last data filed at 07/10/2020 9562  Gross Reese 24 hour   Intake 760 ml   Output --   Net 760 ml     Filed Weights    07/06/20 1259   Weight: 95.3 kg     Examination:     General: deconditioned   Neurology: Awake and alert, non focal   E ENT: no pallor, no icterus, oral mucosa moist  Cardiovascular: No JVD. S1-S2 present, rhythmic, no gallops, rubs, or murmurs. No lower extremity edema.  Pulmonary: vesicular breath sounds bilaterally, adequate air movement, no wheezing, rhonchi or rales.  Gastrointestinal. Abdomen soft and non tender  Skin. No rashes  Musculoskeletal: positive right shoulder pain.     Data Reviewed: I have personally reviewed following labs and imaging studies    CBC:  Recent Labs   Lab 07/06/20  1307 07/06/20  1438 07/07/20  0500   WBC 9.5  --  9.9   NEUTROABS 6.6  --   --    HGB 14.8 15.0 13.5   HCT 44.3 44.0 40.4   MCV 93.9  --  94.4   PLT 166  --  169     Basic Metabolic Panel:  Recent Labs   Lab 07/06/20  1307 07/06/20  1438 07/07/20  0500 07/07/20  0533 07/08/20  0324 07/09/20  0413   NA 135 135 138  --  137 139   K 3.8 3.6 4.0  --  3.3* 3.9   CL 98  --  106  --  101 105   CO2 23  --  22  --  24 24   GLUCOSE 144*  --  93  --  96 110*   BUN 6  --  <5*  --  <5* 7   CREATININE 0.72  --  0.75  --  0.63 0.82   CALCIUM 9.2  --  8.4*  --  8.9 9.2   MG  --   --   --  1.7  --  2.2     GFR:  Estimated Creatinine Clearance: 140.1 mL/min (by C-G formula based on SCr of 0.82 mg/dL).  Liver Function Tests:  Recent Labs   Lab 07/06/20  1307 07/08/20  0324   AST 70* 65*   ALT 70* 69*   ALKPHOS 67 64   BILITOT 1.2 1.4*   PROT 6.9  6.1*   ALBUMIN 3.8 3.3*     No  results for input(s): LIPASE, AMYLASE in the last 168 hours.  No results for input(s): AMMONIA in the last 168 hours.  Coagulation Profile:  No results for input(s): INR, PROTIME in the last 168 hours.  Cardiac Enzymes:  Recent Labs   Lab 07/07/20  0533   CKTOTAL 804*     BNP (last 3 results)  No results for input(s): PROBNP in the last 8760 hours.  HbA1C:  No results for input(s): HGBA1C in the last 72 hours.  CBG:  Recent Labs   Lab 07/06/20  1304   GLUCAP 138*     Lipid Profile:  No results for input(s): CHOL, HDL, LDLCALC, TRIG, CHOLHDL, LDLDIRECT in the last 72 hours.  Thyroid Function Tests:  No results for input(s): TSH, T4TOTAL, FREET4, T3FREE, THYROIDAB in the last 72 hours.  Anemia Panel:  No results for input(s): VITAMINB12, FOLATE, FERRITIN, TIBC, IRON, RETICCTPCT in the last 72 hours.    Radiology Studies:  I have reviewed all of the imaging during this hospital visit personally    Scheduled Meds:  ? amLODipine  10 mg Oral Daily   ? diclofenac Sodium  4 g Topical QID   ? enoxaparin (LOVENOX) injection  40 mg Subcutaneous Q24H   ? folic acid  1 mg Oral Daily   ? lisinopril  10 mg Oral Daily   ? LORazepam  0-4 mg Intravenous Q8H   ? multivitamin with minerals  1 tablet Oral Daily   ? naproxen  500 mg Oral BID WC   ? nicotine  21 mg Transdermal q1800   ? sodium chloride flush  3 mL Intravenous Q12H   ? thiamine  100 mg Oral Daily     Continuous Infusions:     LOS: 4 days     Mauricio Annett Gula, MD      Electronically signed by Coralie Keens, MD at 07/10/2020  9:52 AM EST

## (undated) NOTE — ED Provider Notes (Signed)
Formatting of this note might be different from the original.  Discussed with poison control.    Recommends:  - activated charcoal without sorbitol, 1 g/kg  -Monitor QRS for widening, if greater than 120 administer IV bolus of sodium bicarb  -Monitor QTC is greater than 500 optimize with potassium and magnesium to upper limit of normal  -Recheck EKG in 6hrs  -4-hour Tylenol level  -Seizure potential with kratom overdose.  If seizure recurs recommends benzodiazepine, potentially needing higher dose than usual.  If additional medications needed, phenobarbital.  If additional medications needed, propofol.  -Monitor for neuroleptic malignant syndrome, if dystonia treat with Benadryl or benztropine      Robinson, [place] N, PA-C  10/19/20 1709      Bethann Berkshire, MD  10/19/20 2255    Electronically signed by Bethann Berkshire, MD at 10/19/2020 10:55 PM EDT    Associated attestation - Bethann Berkshire, MD - 10/19/2020 10:55 PM EDT  Formatting of this note might be different from the original.  Medical screening examination/treatment/procedure(s) were performed by non-physician practitioner and as supervising physician I was immediately available for consultation/collaboration.    EKG Interpretation    Date/Time:  Tuesday October 19 2020 16:29:01 EDT  Ventricular Rate:  128  PR Interval:  147  QRS Duration: 89  QT Interval:  318  QTC Calculation: 464  R Axis:   82  Text Interpretation: Sinus tachycardia Ventricular premature complex Aberrant conduction of SV complex(es) Probable left atrial enlargement Confirmed by Bethann Berkshire [phone]) on 10/19/2020 5:44:56 PM

## (undated) NOTE — Progress Notes (Signed)
Formatting of this note might be different from the original.  Patient's girlfriend visiting patient. Wanded by security prior to entry into patient's room. Girlfriend took patient's ID card and debit cards from patient's belongings. Patient aware and agreeable.  Electronically signed by Cyril Loosen, RN at 10/20/2020  5:44 PM EDT

## (undated) NOTE — Progress Notes (Signed)
Formatting of this note might be different from the original.  Pt under review at [facility].    Penni Homans, MSW, LCSW  10/22/2020 10:54 AM    Electronically signed by Harden Mo, LCSW at 10/22/2020 10:54 AM EDT

## (undated) NOTE — ED Provider Notes (Signed)
Associated Order(s): .Intubation  Formatting of this note is different from the original.  Images from the original note were not included.    [facility] EMERGENCY DEPARTMENT  Provider Note    CSN: [id number]  Arrival date & time: 10/19/20  1623        History  Chief Complaint   Patient presents with   ? Drug Overdose     Jeffrey Reese is a 35 y.o. male.    Patient was awake when the paramedics arrived and the paramedics stated that the patient took 2200 mg of Seroquel along with drinking much alcohol and took some kratom.  After the patient got into the ambulance he then became obtunded and had snoring respirations.    The history is provided by the police and the EMS personnel.   Drug Overdose  This is a new problem. The current episode started less than 1 hour ago. The problem occurs rarely. The problem has not changed since onset.Pertinent negatives include no chest pain. Nothing aggravates the symptoms. Nothing relieves the symptoms. Treatments tried: Patient given Narcan without help.         Past Medical History:   Diagnosis Date   ? Alcohol dependence (HCC)    ? 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 Hypertension    ? Suicidal ideation      Patient Active Problem List    Diagnosis Date Noted   ? Overdose by acetaminophen 10/19/2020   ? Severe bipolar I disorder, most recent episode depressed (HCC) 07/14/2020   ? Intentional overdose of drug in tablet form (HCC) 07/06/2020   ? Essential hypertension 07/06/2020   ? Tobacco dependence 07/06/2020   ? Class 1 obesity due to excess calories with body mass index (BMI) of 30.0 to 30.9 in adult 07/06/2020   ? 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 Alcohol use disorder, severe, dependence (HCC) 02/15/2020   ? Delirium tremens (HCC) 12/16/2018   ? Alcohol abuse 06/27/2018     Past Surgical History:   Procedure Laterality Date   ? TONSILLECTOMY         No family history on file.    Social History      Tobacco Use   ? Smoking status: Current Every Day Smoker   ? Smokeless tobacco: Never Used   Substance Use Topics   ? Alcohol use: Yes     Alcohol/week: 24.0 standard drinks     Types: 24 Cans of beer per week   ? Drug use: No     Home Medications  Prior to Admission medications    Medication Sig Start Date End Date Taking? Authorizing Provider   acetaminophen (TYLENOL) 325 MG tablet Take 2 tablets (650 mg total) by mouth every 6 (six) hours as needed for mild pain or moderate pain. 07/12/20   Arrien, York Ram, MD   amLODipine (NORVASC) 10 MG tablet Take 1 tablet (10 mg total) by mouth daily. 07/15/20   Jesse Sans, MD   cariprazine [facility]) capsule Take 1 capsule (1.5 mg total) by mouth daily. 07/15/20   Jesse Sans, MD   divalproex (DEPAKOTE) 250 MG DR tablet Take 1 tablet (250 mg total) by mouth every 12 (twelve) hours. 07/15/20   Jesse Sans, MD   EPINEPHrine 0.3 mg/0.3 mL IJ SOAJ injection Inject 0.3 mg into the muscle as needed for anaphylaxis.    [provider]  lisinopril (ZESTRIL) 10 MG tablet Take 1 tablet (10 mg total) by mouth daily. 07/15/20   Jesse Sans, MD   naproxen (NAPROSYN) 500 MG tablet Take 1 tablet (500 mg total) by mouth 2 (two) times daily with a meal. 12/09/19   Joni Reining, PA-C     Allergies     Bee venom    Review of Systems    Review of Systems   Unable to perform ROS: Mental status change   Cardiovascular: Negative for chest pain.     Physical Exam  Updated Vital Signs  BP 97/69   Pulse (!) 118   Temp (!) 94.8 F (34.9 C)   Resp 13   Ht 5\' 8"  (1.727 m)   Wt 99.8 kg   SpO2 98%   BMI 33.45 kg/m     Physical Exam  Vitals and nursing note reviewed.   Constitutional:       Appearance: He is well-developed and normal weight.   HENT:      Head: Normocephalic.      Nose: Nose normal.   Eyes:      General: No scleral icterus.     Conjunctiva/sclera: Conjunctivae normal.   Neck:      Thyroid: No thyromegaly.   Cardiovascular:      Rate and  Rhythm: Regular rhythm. Tachycardia present.      Heart sounds: No murmur heard.  No friction rub. No gallop.    Pulmonary:      Breath sounds: No stridor. No wheezing or rales.   Chest:      Chest wall: No tenderness.   Abdominal:      General: There is no distension.      Tenderness: There is no abdominal tenderness. There is no rebound.   Musculoskeletal:         General: Normal range of motion.      Cervical back: Neck supple.   Lymphadenopathy:      Cervical: No cervical adenopathy.   Skin:     Findings: No erythema or rash.   Neurological:      Motor: No abnormal muscle tone.      Coordination: Coordination normal.      Comments: Patient with snoring respirations and not responding to verbal or painful stimuli     ED Results / Procedures / Treatments    Labs  (all labs ordered are listed, but only abnormal results are displayed)  Labs Reviewed   LACTIC ACID, PLASMA - Abnormal; Notable for the following components:       Result Value    Lactic Acid, Venous 2.0 (*)     All other components within normal limits   COMPREHENSIVE METABOLIC PANEL - Abnormal; Notable for the following components:    Glucose, Bld 119 (*)     BUN 5 (*)     Calcium 8.5 (*)     AST 127 (*)     ALT 179 (*)     All other components within normal limits   URINALYSIS, ROUTINE W REFLEX MICROSCOPIC - Abnormal; Notable for the following components:    Color, Urine STRAW (*)     Specific Gravity, Urine 1.003 (*)     Hgb urine dipstick SMALL (*)     All other components within normal limits   ETHANOL - Abnormal; Notable for the following components:    Alcohol, Ethyl (B) 351 (*)     All other components within normal limits   VALPROIC ACID LEVEL -  Abnormal; Notable for the following components:    Valproic Acid Lvl <10 (*)     All other components within normal limits   CBG MONITORING, ED - Abnormal; Notable for the following components:    Glucose-Capillary 122 (*)     All other components within normal limits   CULTURE, BLOOD (SINGLE)   URINE  CULTURE   RESP PANEL BY RT-PCR (FLU A&B, COVID) [id number]   CBC WITH DIFFERENTIAL/PLATELET   PROTIME-INR   APTT   RAPID URINE DRUG SCREEN, HOSP PERFORMED   LACTIC ACID, PLASMA   ACETAMINOPHEN LEVEL   BLOOD GAS, ARTERIAL     EKG  EKG Interpretation    Date/Time:  Tuesday October 19 2020 16:29:01 EDT  Ventricular Rate:  128  PR Interval:  147  QRS Duration: 89  QT Interval:  318  QTC Calculation: 464  R Axis:   82  Text Interpretation: Sinus tachycardia Ventricular premature complex Aberrant conduction of SV complex(es) Probable left atrial enlargement Confirmed by Bethann Berkshire [phone]) on 10/19/2020 5:44:56 PM    Radiology  DG Chest Port 1 View    Result Date: 10/19/2020  CLINICAL DATA:  Drug overdose. Possible sepsis. Endotracheal tube placement. EXAM: PORTABLE CHEST 1 VIEW COMPARISON:  07/06/2020 FINDINGS: Endotracheal tube and nasogastric tube are seen in appropriate position. Low lung volumes are again noted. Bibasilar atelectasis is seen, without evidence of pulmonary consolidation or edema. No evidence of pleural effusion. Stable heart size. IMPRESSION: Endotracheal tube and nasogastric tube in appropriate position. Low lung volumes with bibasilar atelectasis. Electronically Signed   By: Danae Orleans M.D.   On: 10/19/2020 17:44     Procedures  Procedure Name: Intubation  Date/Time: 10/19/2020 6:21 PM  Performed by: Bethann Berkshire, MD  Pre-anesthesia Checklist: Patient identified, Patient being monitored, Emergency Drugs available, Timeout performed and Suction available  Oxygen Delivery Method: Non-rebreather mask  Preoxygenation: Pre-oxygenation with 100% oxygen  Induction Type: Rapid sequence  Ventilation: Mask ventilation without difficulty  Placement Confirmation: ETT inserted through vocal cords under direct vision,  CO2 detector and Breath sounds checked- equal and bilateral  Comments: Patient was given etomidate and succinylcholine.  Glide scope was used to intubate the patient.  A 7 and half centimeter tube  was used.  Intubation went well and only took 1 try.  Tube placement was confirmed by CO2 and auscultation along with chest x-ray.        Medications Ordered in ED  Medications   charcoal activated (NO SORBITOL) (ACTIDOSE-AQUA) suspension 100 g (has no administration in time range)   sodium chloride 0.9 % bolus 1,000 mL (0 mLs Intravenous Stopped 10/19/20 1723)   naloxone (NARCAN) injection 2 mg (2 mg Intravenous Given 10/19/20 1626)   naloxone (NARCAN) injection 4 mg (4 mg Intravenous Given 10/19/20 1643)   etomidate (AMIDATE) injection 20 mg (20 mg Intravenous Given 10/19/20 1702)   succinylcholine (ANECTINE) injection 120 mg (120 mg Intravenous Given 10/19/20 1702)   sodium chloride 0.9 % bolus 1,000 mL (1,000 mLs Intravenous New Bag/Given 10/19/20 1701)     ED Course   I have reviewed the triage vital signs and the nursing notes.    Pertinent labs & imaging results that were available during my care of the patient were reviewed by me and considered in my medical decision making (see chart for details).  CRITICAL CARE  Performed by: Bethann Berkshire  Total critical care time: 55 minutes  Critical care time was exclusive of separately billable procedures and treating other patients.  Critical care was necessary to treat or prevent imminent or life-threatening deterioration.  Critical care was time spent personally by me on the following activities: development of treatment plan with patient and/or surrogate as well as nursing, discussions with consultants, evaluation of patient's response to treatment, examination of patient, obtaining history from patient or surrogate, ordering and performing treatments and interventions, ordering and review of laboratory studies, ordering and review of radiographic studies, pulse oximetry and re-evaluation of patient's condition.  Patient had to be intubated because he completely lost his gag reflex.  The intubation went well.  I spoke with critical care and they stated they felt like  the patient could stay at any [facility] hospital.  They did not recommend antibiotics at this time just supportive care.  We spoke with poison control and they have recommendations that [place] put in a note.    MDM Rules/Calculators/A&P      Intentional overdose with Seroquel kratom and alcohol.  Patient is intubated and will get supportive care by the hospitalist with admission to ICU  Final Clinical Impression(s) / ED Diagnoses  Final diagnoses:   Intentional drug overdose, initial encounter [facility])     Rx / DC Orders  ED Discharge Orders     None         Bethann Berkshire, MD  10/19/20 Rickey Primus    Electronically signed by Bethann Berkshire, MD at 10/19/2020  6:22 PM EDT

## (undated) NOTE — ED Notes (Signed)
Formatting of this note might be different from the original.  Pt intubated with 7.5 Et tube by Dr Estell Harpin, 1 attempt without difficulty. 23 cm at lip, + color change on CO2 detector, bilateral breath sounds noted.   Electronically signed by Oletta Darter, RN at 10/19/2020  5:06 PM EDT

## (undated) NOTE — Unmapped External Note (Signed)
Formatting of this note might be different from the original.  [name], FNP recommends in patient treatment once medically cleared. [facility] ICU notified of disposition.  Electronically signed by Celedonio Miyamoto, LCSW at 10/21/2020 11:53 AM EDT

## (undated) NOTE — ED Triage Notes (Signed)
Formatting of this note might be different from the original.  Pt presents to ED via [facility] for overdose. Girlfriend called EMS for overdose, pt alert and oriented initially became unresponsive with snoring respirations, pt denied taking medication except for Seroquel and drinking 6 beers. O2 sats decreased to 78%, Pt received Narcan 4 mg IN, 15L non rebreather started by EMS sats increased to 93%, pinpoint pupils. 18 g R Forearm by EMS  Electronically signed by Oletta Darter, RN at 10/19/2020  4:29 PM EDT

## (undated) NOTE — ED Notes (Signed)
Formatting of this note might be different from the original.  Per EMS, pt took synthetic opioids called Kratom.  Electronically signed by Oletta Darter, RN at 10/19/2020  4:55 PM EDT

## (undated) NOTE — Progress Notes (Signed)
Formatting of this note might be different from the original.  Pt transferred for ED 2 to ICU 7 without complication and on 100%, care turn over to [facility] RRT  Electronically signed by Doloris Hall, RRT at 10/19/2020  8:37 PM EDT

## (undated) NOTE — Progress Notes (Signed)
Formatting of this note might be different from the original.  CSW received request to IVC patient. CSW faxed IVC to magistrate and confirmed receipt. CSW contacted [employer] to serve IVC. Paperwork on the front of patient's hard chart.     Jeffrey Goldmann  LCSW      Electronically signed by Mearl Latin, LCSW at 07/07/2020  5:33 PM EST

## (undated) NOTE — ED Notes (Signed)
Formatting of this note might be different from the original.  Spoke with provider regarding pt continuing to have jerking and thrashing episodes despite ativan. Pt states she will take a look at pts chart and return a phone call.   Electronically signed by Casper Harrison, RN at 07/07/2020  6:27 AM EST

## (undated) NOTE — Unmapped External Note (Signed)
Formatting of this note is different from the original.  Physical Therapy Evaluation  Patient Details  Name: Jeffrey Reese  MRN: [id number]  DOB: [birth year]  Today's Date: 07/08/2020    History of Present Illness   Pt is a 3 y.o. male with a PMH of suicidal ideation, HTN, and alcohol dependence who presents to the hospital 2/2 intentional overdose of Seroquel 6000 mg. EtOH present on blood work. Per chart, pt is homeless and had told family and friends goodbye prior to this event. Pt with acute toxic metabolic encephalpathy in setting of OD. EEG negative for seizures. CT negative for acute intracranial abnormalities.   Clinical Impression   Pt presents with condition mentioned above and deficits mentioned below, see PT Problem List. PTA pt was recently evicted and living with friends or in motels. 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 PTA pt was independent with all functional mob and ADLs without AD/AE. Pt displays coordination and balance deficits that impact his safety with mobility, placing him below his baseline level of function. He demonstrates ataxic movements with short gait bouts (to fatigue) with and without RW in his room with min guard assist, placing him at risk for falls. Furthermore, pt's impaired cognition compared to baseline also impacts his safety as he is unaware of his deficits and his surroundings. Will continue to follow acutely. Recommending pt follow-up with therapy in SNF to address his deficits to maximize his safety and independence with functional mobility at this time.    Follow Up Recommendations SNF;Supervision/Assistance - 24 hour (may change as pt progresses though)      Equipment Recommendations   Rolling walker with 5" wheels (may change as pt progresses)     Recommendations for Other Services         Precautions / Restrictions Precautions  Precautions: Fall  Precaution Comments: suicide risk  Restrictions  Weight Bearing Restrictions: No          Mobility   Bed Mobility  Overal bed mobility: Needs Assistance  Bed Mobility: Rolling;Sidelying to Sit;Sit to Supine  Rolling: Min guard  Sidelying to sit: Min guard    Sit to supine: Min guard    General bed mobility comments: Min guard for safety secondary to impaired cognition and impulsivity, but otherwise pt able to perform all bed mob with increased time due to poor coordination. Pt drags R arm when coming to sit up and scoot anteriorly to EOB.      Transfers  Overall transfer level: Needs assistance  Equipment used: None;Rolling walker (2 wheeled)  Transfers: Sit to/from Stand  Sit to Stand: Min guard          General transfer comment: Pt imulsively coming to stand, min guard for safety due to trunk sway but no overt LOB.    Ambulation/Gait  Ambulation/Gait assistance: Min guard  Gait Distance (Feet): 40 Feet  Assistive device: Rolling walker (2 wheeled);None  Gait Pattern/deviations: Step-through pattern;Decreased step length - right;Decreased step length - left;Decreased stride length;Ataxic;Narrow base of support  Gait velocity: reduced  Gait velocity interpretation: 1.31 - 2.62 ft/sec, indicative of limited community ambulator  General Gait Details: Ambulates with ataxic movements, displaying decreased step length despite cues to correct. No overt LOB, but trunk sway noted progressing from utilizing RW to no AD. Increased stability with RW. Min guard for safety.    Stairs              Psychologist, prison and probation services  Modified Rankin (Stroke Patients Only)  Modified Rankin (Stroke Patients Only)  Pre-Morbid Rankin Score: No symptoms  Modified Rankin: Moderately severe disability        Balance Overall balance assessment: Needs assistance  Sitting-balance support: No upper extremity supported;Feet supported  Sitting balance-Leahy Scale: Good  Sitting balance - Comments: Able to reach off BOS to donn socks, but undershooting (dysmetria?) often requiring assistance to initiate socks on feet, min guard for  safety.    Standing balance support: No upper extremity supported;During functional activity  Standing balance-Leahy Scale: Fair  Standing balance comment: No UE support required and no overt LOB, but unsteadiness noted and min guard required for safety with standing mobility.                            Pertinent Vitals/Pain Pain Assessment: Faces  Faces Pain Scale: Hurts a little bit  Pain Location: L side of body  Pain Descriptors / Indicators: Aching  Pain Intervention(s): Limited activity within patient's tolerance;Monitored during session;Repositioned     Home Living Family/patient expects to be discharged to:: Shelter/Homeless                  Additional Comments: Per mother via phone call, pt was recently evicted from trailer home. He has been staying with friends and in motel rooms since the eviction. 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 Mother reports unknown plan at d/c for where he will live and if anyone is available to assist him. Does not own any AD/AE.     Prior Function Level of Independence: Independent             Comments: Independent with all functional mob and ADL's, per mother.     Hand Dominance         Extremity/Trunk Assessment    Upper Extremity Assessment  Upper Extremity Assessment: Defer to OT evaluation      Lower Extremity Assessment  Lower Extremity Assessment: RLE deficits/detail;LLE deficits/detail  RLE Deficits / Details: MMT scores of 4+ to 5 grossly  RLE Sensation: WNL  RLE Coordination: decreased gross motor  LLE Deficits / Details: MMT scores of 4+ to 5 grossly  LLE Sensation: WNL  LLE Coordination: decreased gross motor      Cervical / Trunk Assessment  Cervical / Trunk Assessment: Normal   Communication    Communication: No difficulties   Cognition Arousal/Alertness: Awake/alert  Behavior During Therapy: Impulsive  Overall Cognitive Status: Impaired/Different from baseline  Area of Impairment: Orientation;Attention;Memory;Following  commands;Safety/judgement;Awareness;Problem solving                  Orientation Level: Disoriented to;Place;Time;Situation  Current Attention Level: Sustained  Memory: Decreased short-term memory  Following Commands: Follows one step commands consistently;Follows one step commands with increased time  Safety/Judgement: Decreased awareness of safety;Decreased awareness of deficits  Awareness: Emergent  Problem Solving: Slow processing;Requires verbal cues;Difficulty sequencing  General Comments: Pt disoriented to location, situation, and date ("January or February 2021"). Pt unaware of deficits or lines/leads impacting his safety, impulsively trying to come to stand often despite cues otherwise.        General Comments        Exercises       Assessment/Plan     PT Assessment Patient needs continued PT services   PT Problem List Decreased activity tolerance;Decreased balance;Decreased mobility;Decreased coordination;Decreased cognition;Decreased knowledge of use of DME;Decreased safety awareness;Decreased knowledge of precautions  PT Treatment Interventions DME instruction;Gait training;Stair training;Functional mobility training;Therapeutic activities;Therapeutic exercise;Balance training;Neuromuscular re-education;Cognitive remediation;Patient/family education      PT Goals (Current goals can be found in the Care Plan section)   Acute Rehab PT Goals  Patient Stated Goal: did not state; mother desires for pt to improve  PT Goal Formulation: With patient/family  Time For Goal Achievement: 07/22/20  Potential to Achieve Goals: Good      Frequency Min 3X/week    Barriers to discharge Decreased caregiver support;Inaccessible home environment (pt homeless with unknown level of support at d/c)      Co-evaluation                AM-PAC PT "6 Clicks" Mobility   Outcome Measure Help needed turning from your back to your side while in a flat bed without using bedrails?: A Little  Help needed moving from lying on your  back to sitting on the side of a flat bed without using bedrails?: A Little  Help needed moving to and from a bed to a chair (including a wheelchair)?: A Little  Help needed standing up from a chair using your arms (e.g., wheelchair or bedside chair)?: A Little  Help needed to walk in hospital room?: A Little  Help needed climbing 3-5 steps with a railing? : A Lot  6 Click Score: 17      End of Session Equipment Utilized During Treatment: Gait belt  Activity Tolerance: Patient limited by fatigue  Patient left: in bed;with call bell/phone within reach;with bed alarm set;with nursing/sitter in room  Nurse Communication: Mobility status  PT Visit Diagnosis: Unsteadiness on feet (R26.81);Other abnormalities of gait and mobility (R26.89);Ataxic gait (R26.0);Difficulty in walking, not elsewhere classified (R26.2)      Time: 1324-4010  PT Time Calculation (min) (ACUTE ONLY): 17 min    Charges:   PT Evaluation  $PT Eval Low Complexity: 1 Low          Raymond Gurney, PT, DPT  Acute Rehabilitation Services   Pager: [phone]  Office: [phone]    Jewel Baize  07/08/2020, 2:12 PM      Electronically signed by Jewel Baize, PT at 07/08/2020  2:16 PM EST

## (undated) NOTE — Progress Notes (Signed)
Formatting of this note is different from the original.  Images from the original note were not included.    PROGRESS NOTE    HOOD HATTON  ZOX:[id number] DOB: 1980-11-09 DOA: 07/06/2020  PCP: Patient, No Pcp Per     Brief Narrative:   Mr. Lutman admitted to the hospital with a working diagnosis of intentional overdose with quetiapine/ buspirone,complicated withextrapyramidal andanticholinergicsyndrome.    33 year old male with past medical history for hypertension, depression, anxiety, alcohol abuse and tobacco dependence who presents after an intentional overdose. He attempted suicide via overdose on 01/09,he called his mother to say goodbye. The following day he was delusional and disoriented.  01/11he was found down at the hotel room around 7 in the morning, apparently overdosed with unknown amount of quetiapine +/-buspiron he he received naloxone by EMS with no significant change. He was brought to the hospital for further evaluation.  On his initial physical examination blood pressure 123/112, heart rate 123, respirate 16, oxygen saturation 99%, lungs clear to auscultation bilaterally, heart S1-S2, present, tachycardic, abdomen soft, no lower extremity edema.    Chest radiograph with bibasilar atelectasis, no frank infiltrates.     EKG 120 bpm, normal axis, normal intervals, sinus rhythm, no st segment or T wave changes.    Patient placed under supportive medical care with slow improvement of his symptoms.   Initially required 4 point restrains.     Psychiatry consulted and recommendations for inpatient psych. Patient is involuntary committed due to high suicidal risk.     Assessment & Plan:    Principal Problem:    Intentional overdose of drug in tablet form (HCC)  Active Problems:    Major depressive disorder, recurrent severe without psychotic features (HCC)    Alcohol use disorder, severe, dependence (HCC)    Essential hypertension    Tobacco dependence    Class 1 obesity due to excess  calories with body mass index (BMI) of 30.0 to 30.9 in adult    1. Suicidal attempt, quetiapine and buspirone overdose, complicated with extrapyramidal syndrome and anticholinergic syndrome. EEG with no seizures.   Patient's mentation continue to improve, now off restrains, continue with one to one sitter for suicidal precautions.  He is calm and cooperative.   Brain MRI with no acute changes, questionable changes in T2 signal in the medial thalami to hypothalamic region, can indicate Wernicke encephalopathy.     Patient at home on divalproex, buspirone, escitalopram and quetiapine.     Continue suicidal precautions, pending transfer to inpatient psych, now patient is medically stable for transfer.  Continue IVC     2. HTN. Blood pressure 145/98. Continue blood pressure control with amlodipine and lisinopril.     3. Tobacco and alcohol dependence.   Symptoms have been improving, no significant tremors.     On CIWA protocol, last dose on 07/07/20. Continue neuro checks per unit protocol  On multivitamins and thiamine.     4. Hypomagnesemia/ hypokalemia. electrolytes have been corrected with k at 3,9 and Na at 139 with mg at 2,2 and bicarbonate at 24.  Renal function with cr at 0,82.     5. Obesity type 1/ chronic pain/ right shoulder pain, suspected rotator cuff tear. Calculated BMI is 30,1. Continue with naproxen and continue with as needed morphine.   Films from right shoulder with no fractures. Add topical diclofenac and continue physical therapy.     Status is: Inpatient    Remains inpatient appropriate because:Unsafe d/c plan    Dispo: The  patient is from: Home               Anticipated d/c is to: inpatient psych               Anticipated d/c date is: 1 day               Patient currently is medically stable to d/c.    DVT prophylaxis: Enoxaparin    Code Status:   full   Family Communication:  No family at the bedside      Consultants:    Neurology    Psychiatry     Subjective:  Patient seating at the  side of the bed, calm and not confused. Positive right shoulder pain, worse with movement, no nausea or vomiting.     Objective:  Vitals:    07/08/20 2354 07/09/20 0338 07/09/20 0756 07/09/20 1144   BP: (!) 136/108 (!) 149/107 (!) 145/98 (!) 139/105   Pulse: 91 90 94 100   Resp: 18 20 18 20    Temp: 98 F (36.7 C) 97.9 F (36.6 C) 98.2 F (36.8 C) 98.6 F (37 C)   TempSrc: Oral Oral Oral Oral   SpO2: 99% 98% 98% 98%   Weight:       Height:         Intake/Output Summary (Last 24 hours) at 07/09/2020 1311  Last data filed at 07/09/2020 0981  Gross per 24 hour   Intake 2058.29 ml   Output 1800 ml   Net 258.29 ml     Filed Weights    07/06/20 1259   Weight: 95.3 kg     Examination:     General: Not in pain or dyspnea   Neurology: Awake and alert, non focal   E ENT: mild pallor, no icterus, oral mucosa moist  Cardiovascular: No JVD. S1-S2 present, rhythmic, no gallops, rubs, or murmurs. No lower extremity edema.  Pulmonary: positive breath sounds bilaterally, adequate air movement, no wheezing, rhonchi or rales.  Gastrointestinal. Abdomen soft and non tender  Skin. Right heal blister.   Musculoskeletal: positive pain to palpation at the right shoulder    Data Reviewed: I have personally reviewed following labs and imaging studies    CBC:  Recent Labs   Lab 07/06/20  1307 07/06/20  1438 07/07/20  0500   WBC 9.5  --  9.9   NEUTROABS 6.6  --   --    HGB 14.8 15.0 13.5   HCT 44.3 44.0 40.4   MCV 93.9  --  94.4   PLT 166  --  169     Basic Metabolic Panel:  Recent Labs   Lab 07/06/20  1307 07/06/20  1438 07/07/20  0500 07/07/20  0533 07/08/20  0324 07/09/20  0413   NA 135 135 138  --  137 139   K 3.8 3.6 4.0  --  3.3* 3.9   CL 98  --  106  --  101 105   CO2 23  --  22  --  24 24   GLUCOSE 144*  --  93  --  96 110*   BUN 6  --  <5*  --  <5* 7   CREATININE 0.72  --  0.75  --  0.63 0.82   CALCIUM 9.2  --  8.4*  --  8.9 9.2   MG  --   --   --  1.7  --  2.2     GFR:  Estimated Creatinine  Clearance: 140.1 mL/min (by C-G formula  based on SCr of 0.82 mg/dL).  Liver Function Tests:  Recent Labs   Lab 07/06/20  1307 07/08/20  0324   AST 70* 65*   ALT 70* 69*   ALKPHOS 67 64   BILITOT 1.2 1.4*   PROT 6.9 6.1*   ALBUMIN 3.8 3.3*     No results for input(s): LIPASE, AMYLASE in the last 168 hours.  No results for input(s): AMMONIA in the last 168 hours.  Coagulation Profile:  No results for input(s): INR, PROTIME in the last 168 hours.  Cardiac Enzymes:  Recent Labs   Lab 07/07/20  0533   CKTOTAL 804*     BNP (last 3 results)  No results for input(s): PROBNP in the last 8760 hours.  HbA1C:  No results for input(s): HGBA1C in the last 72 hours.  CBG:  Recent Labs   Lab 07/06/20  1304   GLUCAP 138*     Lipid Profile:  No results for input(s): CHOL, HDL, LDLCALC, TRIG, CHOLHDL, LDLDIRECT in the last 72 hours.  Thyroid Function Tests:  Recent Labs     07/07/20  0533   TSH 1.138     Anemia Panel:  No results for input(s): VITAMINB12, FOLATE, FERRITIN, TIBC, IRON, RETICCTPCT in the last 72 hours.    Radiology Studies:  I have reviewed all of the imaging during this hospital visit personally    Scheduled Meds:  ? amLODipine  10 mg Oral Daily   ? enoxaparin (LOVENOX) injection  40 mg Subcutaneous Q24H   ? [START ON 07/10/2020] folic acid  1 mg Oral Daily   ? lisinopril  10 mg Oral Daily   ? LORazepam  0-4 mg Intravenous Q8H   ? multivitamin with minerals  1 tablet Oral Daily   ? naproxen  500 mg Oral BID WC   ? nicotine  21 mg Transdermal q1800   ? sodium chloride flush  3 mL Intravenous Q12H     Continuous Infusions:  ? dextrose 5% lactated ringers 75 mL/hr at 07/08/20 2240      LOS: 3 days     Mauricio Annett Gula, MD      Electronically signed by Coralie Keens, MD at 07/09/2020  4:15 PM EST

## (undated) NOTE — Progress Notes (Signed)
Formatting of this note is different from the original.  Images from the original note were not included.    PROGRESS NOTE    Jeffrey Reese  ZOX:[id number] DOB: 1981/05/09 DOA: 10/19/2020  PCP: Pcp, No     Brief Narrative:   89 y/o male with history of Bipolar, alcohol abuse, admitted after intentional overdose. He was unresponsive on arrival and intubated in ED since it was felt that he was unable to protect airway. Had transient hypotension after admission which was likely related to meds. Blood pressures have since stabilized. He was extubated on 4/27 and respiratory status has been stable. He was seen by psychiatry with recommendations for inpatient psychiatry. He is medically cleared for discharge once a bed is available    Assessment & Plan:    Principal Problem:    Overdose  Active Problems:    Intentional overdose of drug in tablet form (HCC)    Essential hypertension    History of alcohol use disorder    Acute respiratory failure (HCC)    Transaminitis    Hypothermia    Bipolar disorder (HCC)    Intentional overdose  -patient had taken 2 grams of seroquel, kratom and alcohol  -received activated charcoal per poison control  -continue to monitor for arrhythmias  -seen by psychiatry with recommendations for inpatient psychiatry    Acute toxic encephalopathy  -related to overdose of medications/alcohol  -mental status now back to baseline    Acute respiratory failure  -patient was intubated in ED due to concerns that he was not able to protect his airway  -he was able to extubate on 4/27  -respiratory status has been stable    History of alcohol dependence  -per review of records, he does have a history of DTs  -continue to monitor on CIWA protocol with ativan  -current CIWA score is 5  -continue on thiamine and folate    Hypertension  -chronically on lisinopril and amlodipine which have been resumed  -BP currently stable    Bipolar disorder  -chronically on seroquel, depakote, cariprazine  -holding all meds  since he was altered on admission  -will defer to psych regarding resuming meds    Elevated LFTs  -suspect this is related to alcohol  -trending down    DVT prophylaxis: SCDs Start: 10/19/20 2059    Code Status: full code  Family Communication: no family at bedside  Disposition Plan: Status is: Inpatient    Remains inpatient appropriate because:Inpatient level of care appropriate due to severity of illness. He is medically stable to discharge to psychiatry once a bed is available    Dispo: The patient is from: Home               Anticipated d/c is to: inpatient psychiatry               Patient currently is medically stable to d/c.    Difficult to place patient No    Consultants:    PCCM    Procedures:    ETT 4/26>    Antimicrobials:         Subjective:  Intubated and sedated. Was agitated overnight and is currently in wrist restraints. He had some hypotension overnight and briefly required levophed, but has since been weaned off.     Objective:  Vitals:    10/21/20 0630 10/21/20 0700 10/21/20 0730 10/21/20 0800   BP: 122/84 122/89 116/88 125/83   Pulse: 94 100 97 92   Resp: (!) 22  19 (!) 22    Temp:       TempSrc:       SpO2: 92%  96%    Weight:       Height:         Intake/Output Summary (Last 24 hours) at 10/21/2020 0826  Last data filed at 10/21/2020 0756  Gross per 24 hour   Intake 3299.58 ml   Output 1500 ml   Net 1799.58 ml     Filed Weights    10/19/20 1630 10/20/20 0411   Weight: 99.8 kg 103.2 kg     Examination:    General exam: Alert, awake, oriented x 3  Respiratory system: coarse breath sounds bilaterally. Respiratory effort normal.  Cardiovascular system:RRR. No murmurs, rubs, gallops.  Gastrointestinal system: Abdomen is nondistended, soft and nontender. No organomegaly or masses felt. Normal bowel sounds heard.  Central nervous system: Alert and oriented. No focal neurological deficits.  Extremities: No C/C/E, +pedal pulses  Skin: No rashes, lesions or ulcers  Psychiatry: Judgement and insight appear  normal. Mood & affect appropriate.     Data Reviewed: I have personally reviewed following labs and imaging studies    CBC:  Recent Labs   Lab 10/19/20  1628 10/20/20  0531 10/21/20  0551   WBC 8.3 7.4 12.2*   NEUTROABS 4.1 5.2  --    HGB 16.8 13.8 14.3   HCT 50.3 42.8 43.8   MCV 96.5 97.7 97.6   PLT 200 166 154     Basic Metabolic Panel:  Recent Labs   Lab 10/19/20  1628 10/19/20  2105 10/20/20  0531 10/21/20  0551   NA 135 138 139 135   K 3.8 3.8 3.9 3.7   CL 102 104 107 103   CO2 22 20* 22 26   GLUCOSE 119* 111* 86 106*   BUN 5* 6 5* 8   CREATININE 0.67 0.58* 0.64 0.73   CALCIUM 8.5* 7.6* 8.1* 8.2*   MG  --  1.8 1.9  --    PHOS  --  4.4  --   --      GFR:  Estimated Creatinine Clearance: 144.3 mL/min (by C-G formula based on SCr of 0.73 mg/dL).  Liver Function Tests:  Recent Labs   Lab 10/19/20  1628 10/20/20  0531 10/21/20  0551   AST 127* 68* 29   ALT 179* 121* 78*   ALKPHOS 78 56 68   BILITOT 0.6 0.5 1.1   PROT 7.3 5.5* 5.8*   ALBUMIN 4.1 3.0* 3.0*     No results for input(s): LIPASE, AMYLASE in the last 168 hours.  No results for input(s): AMMONIA in the last 168 hours.  Coagulation Profile:  Recent Labs   Lab 10/19/20  1628   INR 1.0     Cardiac Enzymes:  No results for input(s): CKTOTAL, CKMB, CKMBINDEX, TROPONINI in the last 168 hours.  BNP (last 3 results)  No results for input(s): PROBNP in the last 8760 hours.  HbA1C:  No results for input(s): HGBA1C in the last 72 hours.  CBG:  Recent Labs   Lab 10/19/20  1629 10/20/20  0720   GLUCAP 122* 94     Lipid Profile:  Recent Labs     10/20/20  0531   TRIG 178*     Thyroid Function Tests:  No results for input(s): TSH, T4TOTAL, FREET4, T3FREE, THYROIDAB in the last 72 hours.  Anemia Panel:  No results for input(s): VITAMINB12, FOLATE, FERRITIN, TIBC,  IRON, RETICCTPCT in the last 72 hours.  Sepsis Labs:  Recent Labs   Lab 10/19/20  1636 10/19/20  1744   LATICACIDVEN 2.0* 2.1*     Recent Results (from the past 240 hour(s))   Urine culture     Status: None     Collection Time: 10/19/20  4:28 PM    Specimen: In/Out Cath Urine   Result Value Ref Range Status    Specimen Description   Final     IN/OUT CATH URINE  Performed at [facility], [address] [place], [place] [address]     Special Requests   Final     NONE  Performed at [facility], [address], [place], [place] [address]     Culture   Final     NO GROWTH  Performed at [facility], [address], [place], [place] [address]     Report Status 10/21/2020 FINAL  Final   Resp Panel by RT-PCR (Flu A&B, Covid) Nasopharyngeal Swab     Status: None    Collection Time: 10/19/20  4:36 PM    Specimen: Nasopharyngeal Swab; Nasopharyngeal(NP) swabs in vial transport medium   Result Value Ref Range Status    SARS Coronavirus 2 by RT PCR NEGATIVE NEGATIVE Final     Comment: (NOTE)  SARS-CoV-2 target nucleic acids are NOT DETECTED.    The SARS-CoV-2 RNA is generally detectable in upper respiratory  specimens during the acute phase of infection. The lowest  concentration of SARS-CoV-2 viral copies this assay can detect is  138 copies/mL. A negative result does not preclude SARS-Cov-2  infection and should not be used as the sole basis for treatment or  other patient management decisions. A negative result may occur with   improper specimen collection/handling, submission of specimen other  than nasopharyngeal swab, presence of viral mutation(s) within the  areas targeted by this assay, and inadequate number of viral  copies(<138 copies/mL). A negative result must be combined with  clinical observations, patient history, and epidemiological  information. The expected result is Negative.    Fact Sheet for Patients:   BloggerCourse.com    Fact Sheet for Healthcare Providers:   SeriousBroker.it    This test is no t yet approved or cleared by the [place] FDA and   has been authorized for detection and/or diagnosis of SARS-CoV-2 by  FDA under an Emergency Use  Authorization (EUA). This EUA will remain   in effect (meaning this test can be used) for the duration of the  COVID-19 declaration under Section 564(b)(1) of the Act, 21  U.S.C.section 360bbb-3(b)(1), unless the authorization is terminated   or revoked sooner.        Influenza A by PCR NEGATIVE NEGATIVE Final    Influenza B by PCR NEGATIVE NEGATIVE Final     Comment: (NOTE)  The Xpert Xpress SARS-CoV-2/FLU/RSV plus assay is intended as an aid  in the diagnosis of influenza from Nasopharyngeal swab specimens and  should not be used as a sole basis for treatment. Nasal washings and  aspirates are unacceptable for Xpert Xpress SARS-CoV-2/FLU/RSV  testing.    Fact Sheet for Patients:  BloggerCourse.com    Fact Sheet for Healthcare Providers:  SeriousBroker.it    This test is not yet approved or cleared by the [place] FDA and  has been authorized for detection and/or diagnosis of SARS-CoV-2 by  FDA under an Emergency Use Authorization (EUA). This EUA will remain  in effect (meaning this test  can be used) for the duration of the  COVID-19 declaration under Section 564(b)(1) of the Act, 21 U.S.C.  section 360bbb-3(b)(1), unless the authorization is terminated or  revoked.    Performed at [facility], [address], [place], [place] [address]    Blood culture (routine single)     Status: None (Preliminary result)    Collection Time: 10/19/20  5:44 PM    Specimen: Site Not Specified; Blood   Result Value Ref Range Status    Specimen Description SITE NOT SPECIFIED  Final    Special Requests   Final     BOTTLES DRAWN AEROBIC AND ANAEROBIC Blood Culture adequate volume    Culture   Final     NO GROWTH 2 DAYS  Performed at [facility], [address] [place], [place] [address]     Report Status PENDING  Incomplete   MRSA PCR Screening     Status: Abnormal    Collection Time: 10/20/20  9:53 AM    Specimen: Nasopharyngeal   Result Value Ref Range Status    MRSA by PCR  POSITIVE (A) NEGATIVE Final     Comment:         The GeneXpert MRSA Assay (FDA  approved for NASAL specimens  only), is one component of a  comprehensive MRSA colonization  surveillance program. It is not  intended to diagnose MRSA  infection nor to guide or  monitor treatment for  MRSA infections.  RESULT CALLED TO, READ BACK BY AND VERIFIED WITH:  LOOMIS K. AT 1612 ON [id number] BY [place] S.  Performed at [facility], [address], [place], [place] [address]        Radiology Studies:  DG CHEST PORT 1 VIEW    Result Date: 10/20/2020  CLINICAL DATA:  Respiratory failure EXAM: PORTABLE CHEST 1 VIEW COMPARISON:  10/19/2020. FINDINGS: Endotracheal tube 5.2 cm above the carina. Nasogastric tube extends into the upper abdomen. Pulmonary insufflation is improved. The lungs are clear. No pneumothorax or pleural effusion. Cardiac size within normal limits. Pulmonary vascularity is normal. IMPRESSION: Stable support tubes. Improved pulmonary insufflation. No focal pulmonary infiltrate. Are Electronically Signed   By: Helyn Numbers MD   On: 10/20/2020 04:34     DG Chest Port 1 View    Result Date: 10/19/2020  CLINICAL DATA:  Drug overdose. Possible sepsis. Endotracheal tube placement. EXAM: PORTABLE CHEST 1 VIEW COMPARISON:  07/06/2020 FINDINGS: Endotracheal tube and nasogastric tube are seen in appropriate position. Low lung volumes are again noted. Bibasilar atelectasis is seen, without evidence of pulmonary consolidation or edema. No evidence of pleural effusion. Stable heart size. IMPRESSION: Endotracheal tube and nasogastric tube in appropriate position. Low lung volumes with bibasilar atelectasis. Electronically Signed   By: Danae Orleans M.D.   On: 10/19/2020 17:44     Scheduled Meds:  ? amLODipine  10 mg Oral Daily   ? Chlorhexidine Gluconate Cloth  6 each Topical Daily   ? docusate  100 mg Per Tube BID   ? enoxaparin (LOVENOX) injection  50 mg Subcutaneous Q24H   ? lisinopril  10 mg Oral Daily   ? LORazepam  0-4 mg  Intravenous Q4H    Followed by   ? [START ON 10/22/2020] LORazepam  0-4 mg Intravenous Q8H   ? multivitamin with minerals  1 tablet Oral Daily   ? mupirocin ointment   Nasal BID   ? nicotine  21 mg Transdermal Daily   ? polyethylene glycol  17 g Per  Tube Daily   ? polyethylene glycol  17 g Per Tube Daily   ? thiamine injection  100 mg Intravenous Daily     Continuous Infusions:  ? sodium chloride Stopped (10/20/20 0530)      LOS: 2 days     Time spent: 35 mins    Erick Blinks, MD  [facility]    If 7PM-7AM, please contact night-coverage  [email or web address]    10/21/2020, 8:26 AM   Electronically signed by Erick Blinks, MD at 10/21/2020  6:52 PM EDT

## (undated) NOTE — ED Notes (Signed)
Formatting of this note might be different from the original.  Discharge teaching done and pt verbalized understanding. Pt given all his personal belongings. Escorted to lobby, ambulatory and in NAD.  Electronically signed by Lambert Mody, RN at 02/17/2020  1:46 PM EDT

## (undated) NOTE — Progress Notes (Signed)
Formatting of this note is different from the original.  Physical Therapy Treatment  Patient Details  Name: Jeffrey Reese  MRN: [id number]  DOB: [birth year]  Today's Date: 07/09/2020    History of Present Illness Pt is a 8 y.o. male with a PMH of suicidal ideation, HTN, and alcohol dependence who presents to the hospital 2/2 intentional overdose of Seroquel 6000 mg. EtOH present on blood work. Per chart, pt is homeless and had told family and friends goodbye prior to this event. Pt with acute toxic metabolic encephalpathy in setting of OD. EEG negative for seizures. CT negative for acute intracranial abnormalities.      PT Comments      Pt significantly progressed in cognition and functional status compared to yesterday's session. Pt A&Ox4, but does continue to display minor deficits in safety awareness. However, his balance and coordination have improved in that he required supervision for ambulation in hallways without AD/AE and no LOB. His DGI score of 20 this date also suggests low risk for falls. Pt does display some minor coordination deficits with stairs though, resulting in poor foot clearance and minor tripping with min guard to recover. Pt reports this is due to the pain from "blisters" on his L heel and R foot (examined and notified RN of these). Pt does report R shoulder pain with him being (-) for R shoulder Lift-off test and infraspinatus test but testing (+) for Drop arm sign test and pain with palpation to muscle belly and insertion site of supraspinatus muscle on R shoulder, notified MD. X-ray of shoulder this date was negative though. Will continue to follow-up acutely, but pt is close to his reported and assumed baseline level of function and expect he will return to baseline as cognition continues to improve.     Follow Up Recommendations   No PT follow up      Equipment Recommendations   None recommended by PT     Recommendations for Other Services        Precautions / Restrictions  Precautions  Precautions: Fall  Precaution Comments: 1:1 sitter, suicide risk, flight risk  Restrictions  Weight Bearing Restrictions: No      Mobility   Bed Mobility                General bed mobility comments: Pt sitting EOB upon arrival.    Transfers  Overall transfer level: Needs assistance  Equipment used: None  Transfers: Sit to/from Stand  Sit to Stand: Supervision          General transfer comment: Pt able to come to stand quickly without LOB, minor trunk sway, supervision for safety.    Ambulation/Gait  Ambulation/Gait assistance: Supervision  Gait Distance (Feet): 300 Feet  Assistive device: None  Gait Pattern/deviations: Step-through pattern;Decreased stride length  Gait velocity: WFL (when cued to increase speed)  Gait velocity interpretation: >4.37 ft/sec, indicative of normal walking speed  General Gait Details: Ambulates with decreased stride length without LOB, supervision for safety.    Stairs  Stairs: Yes  Stairs assistance: Min guard  Stair Management: Alternating pattern;One rail Right;One rail Left  Number of Stairs: 7 (4 standard height, 3 shorter steps)  General stair comments: Ascending and descending stairs quickly, with moments of poor coordination to dorsiflex foot resulting in tripping on edge of stair intermittently, min guard to recover. Cued to slow down and focus on safety.    Wheelchair Mobility      Modified Rankin (Stroke Patients Only)  Modified  Rankin (Stroke Patients Only)  Pre-Morbid Rankin Score: No symptoms  Modified Rankin: Moderately severe disability      Balance Overall balance assessment: Needs assistance  Sitting-balance support: No upper extremity supported;Feet supported  Sitting balance-Leahy Scale: Good  Sitting balance - Comments: No LOB sitting EOB, supervision for safety.    Standing balance support: No upper extremity supported;During functional activity  Standing balance-Leahy Scale: Good  Standing balance comment: No UE support for mobility. Minor LOB with  stairs and min guard to recover.                  Standardized Balance Assessment  Standardized Balance Assessment : Dynamic Gait Index    Dynamic Gait Index  Level Surface: Normal  Change in Gait Speed: Normal  Gait with Horizontal Head Turns: Mild Impairment  Gait with Vertical Head Turns: Mild Impairment  Gait and Pivot Turn: Normal  Step Over Obstacle: Normal  Step Around Obstacles: Mild Impairment  Steps: Mild Impairment  Total Score: 20        Cognition Arousal/Alertness: Awake/alert  Behavior During Therapy: WFL for tasks assessed/performed  Overall Cognitive Status: Impaired/Different from baseline  Area of Impairment: Safety/judgement;Awareness;Problem solving                          Safety/Judgement: Decreased awareness of safety  Awareness: Anticipatory  Problem Solving: Slow processing;Requires verbal cues  General Comments: A&Ox4. Pt anticipates need to change gait with obstacles, but pt quick with ascending and descending stairs even with some coordination deficits resulting in minor tripping on stairs, min guard to recover. Pt states it is due to blister on L heel.        Exercises        General Comments General comments (skin integrity, edema, etc.): Bruising, redness, and wounds greater on R side of body than L; Noted white boggy "blister" on L heel and "blister" on lateral plantar surface of R foot that had been present per pt PTA (RN notified); (-) R shoulder Lift-off test and infraspinatus test; (+) Drop arm sign test and pain with palpation to muscle belly and insertion site of supraspinatus muscle on R shoulder        Pertinent Vitals/Pain Pain Assessment: Faces  Faces Pain Scale: Hurts even more  Pain Location: R shoulder with flexion AROM and palpation; L heel with mobility  Pain Descriptors / Indicators: Grimacing;Guarding  Pain Intervention(s): Limited activity within patient's tolerance;Monitored during session;Repositioned     Home Living                      Prior Function              PT Goals (current goals can now be found in the care plan section) Acute Rehab PT Goals  Patient Stated Goal: to improve his R shoulder  PT Goal Formulation: With patient/family  Time For Goal Achievement: 07/22/20  Potential to Achieve Goals: Good      Frequency     Min 3X/week      PT Plan Discharge plan needs to be updated     Co-evaluation                AM-PAC PT "6 Clicks" Mobility    Outcome Measure   Help needed turning from your back to your side while in a flat bed without using bedrails?: None  Help needed moving from lying on your back to sitting on  the side of a flat bed without using bedrails?: None  Help needed moving to and from a bed to a chair (including a wheelchair)?: A Little  Help needed standing up from a chair using your arms (e.g., wheelchair or bedside chair)?: A Little  Help needed to walk in hospital room?: A Little  Help needed climbing 3-5 steps with a railing? : A Little  6 Click Score: 20      End of Session Equipment Utilized During Treatment: Gait belt  Activity Tolerance: Patient tolerated treatment well  Patient left: with nursing/sitter in room (standing with sitter)  Nurse Communication: Mobility status;Other (comment) (blisters on feet)  PT Visit Diagnosis: Unsteadiness on feet (R26.81);Other abnormalities of gait and mobility (R26.89);Ataxic gait (R26.0);Difficulty in walking, not elsewhere classified (R26.2)      Time: 0981-1914  PT Time Calculation (min) (ACUTE ONLY): 14 min    Charges:  $Gait Training: 8-22 mins               Raymond Gurney, PT, DPT  Acute Rehabilitation Services   Pager: [phone]  Office: [phone]    Jewel Baize  07/09/2020, 3:36 PM      Electronically signed by Jewel Baize, PT at 07/09/2020  3:41 PM EST

## (undated) NOTE — ED Notes (Signed)
Formatting of this note might be different from the original.  Poison control [phone] case # [id number]; watch out for CNS depression, NV, tachycardia, hypotension, anticholinergic effects, prolonged QTC (12 lead EKG report, vitals and patient's current state shared with Misty Stanley w/ poison control as well. Recommends IV fluids, cardiac monitor, avoid Geodon and Haldol. Keep Mag and potassium level at the high end of normal. Will call back for updates, but reach out sooner if [place].   Electronically signed by Forbes Cellar, RN at 07/06/2020  1:25 PM EST

## (undated) NOTE — ED Notes (Signed)
Formatting of this note might be different from the original.  Hourly rounding reveals patient pacing in room, sweat is visible on forehead.  Ice water provided per pt request, he states he is very thirsty.  3-4 drinks have been provided in the past two hours.  No other requests, stable, displaying some anxiety but in no acute distress. Q15 minute rounds and monitoring via Psychologist, counselling to continue.  Electronically signed by Ancil Boozer, RN at 02/15/2020  5:53 PM EDT

## (undated) NOTE — Unmapped External Note (Signed)
Formatting of this note is different from the original.  [facility] Face-to-Face Psychiatry Consult     Reason for Consult:  Suicide threat  Referring Physician:  EDP  Patient Identification: Jeffrey Reese  MRN:  [id number]  Principal Diagnosis: Major depressive disorder, recurrent severe without psychotic features (HCC)  Diagnosis:  Principal Problem:    Major depressive disorder, recurrent severe without psychotic features (HCC)  Active Problems:    Alcohol use disorder, severe, dependence (HCC)    Total Time spent with patient: 45 minutes    Subjective:    Jeffrey Reese is a 87 y.o. male patient admitted with suicide threat.    Patient seen and evaluated in person by this provider and TTS.  He presents to the ED with suicidal thoughts and plan to shoot himself with a gun.  He endorses feelings of hopelessness, helplessness, and worthlessness.  His depression increases with alcohol use drinking per patient 12-15 beers a day since the age of 6 with 2 previous DWIs.  His longer sobriety was 2 days when he was here in the past and was having DTs from withdrawal.  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  Depression is constant through the day and greater in the evening.  He works as a Chartered certified accountant during the week.  Reports a history of depression starting at the age of 73 when he was treated with Wellbutrin and Depakote.  Seems to emphasize the bipolar despite being treated per patient's report not since the age of 29, questionable diagnosis of bipolar.  Appears to be morbid depressive symptoms.  Lives alone with no support system.  No homicidal ideations, hallucinations, mania, and paranoia, or other symptoms not noted above.  Recommend inpatient hospitalization.    HPI per MD:  Jeffrey Reese is a 59 y.o. male with a history of alcohol abuse who presents with complaints of depression and feelings of SI.  Patient admits to drinking between 8 and 24 beers a day, he says that he is a chronic  functioning alcoholic.  He reports that he has had thoughts of harming himself.  Does not have a distinct plan.  Reports that he would like to go to the [place] where he apparently does get treatment.  Has no physical complaints at this time    Past Psychiatric History: alcohol dependence, depression    Risk to Self: Suicidal Ideation: Yes-Currently Present  Suicidal Intent: No  Is patient at risk for suicide?: No, but patient needs Medical Clearance  Suicidal Plan?: Yes-Currently Present  Specify Current Suicidal Plan: Shoot himself   Access to Means: Yes (owns a gun )  Specify Access to Suicidal Means: Per pt current charges pt has access to a gun   What has been your use of drugs/alcohol within the last 12 months?: alcohol & marijuana   How many times?: 0  Other Self Harm Risks: None reported   Triggers for Past Attempts: None known  Intentional Self Injurious Behavior: None  Risk to Others: Homicidal Ideation: No  Thoughts of Harm to Others: No  Current Homicidal Intent: No  Current Homicidal Plan: No  Access to Homicidal Means: No  Identified Victim: n/a  History of harm to others?: No  Assessment of Violence: None Noted  Violent Behavior Description: n/a  Does patient have access to weapons?: No  Criminal Charges Pending?: Yes  Describe Pending Criminal Charges:  nonconcealed weapon  Does patient have a court date: Yes  Court  Date: 03/03/20  Prior Inpatient Therapy: Prior Inpatient Therapy: Yes  Prior Therapy Dates: Pt report Mid 2000's  Prior Therapy Facilty/Provider(s):    Reason for Treatment: MH  Prior Outpatient Therapy: Prior Outpatient Therapy: No  Does patient have an ACCT team?: No  Does patient have Intensive In-House Services?  : No  Does patient have [facility] services? : Unknown  Does patient have P4CC services?: Unknown    Past Medical History:   Past Medical History:   Diagnosis Date   ? 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 Hypertension      Past Surgical History:   Procedure  Laterality Date   ? TONSILLECTOMY       Family History: History reviewed. No pertinent family history.  Family Psychiatric  History: none  Social History:   Social History     Substance and Sexual Activity   Alcohol Use Yes   ? Alcohol/week: 24.0 standard drinks   ? Types: 24 Cans of beer per week     Social History     Substance and Sexual Activity   Drug Use No     Social History     Socioeconomic History   ? Marital status: Single     Spouse name: Not on file   ? Number of children: Not on file   ? Years of education: Not on file   ? Highest education level: Not on file   Occupational History   ? Not on file   Tobacco Use   ? Smoking status: Current Every Day Smoker   ? Smokeless tobacco: Never Used   Substance and Sexual Activity   ? Alcohol use: Yes     Alcohol/week: 24.0 standard drinks     Types: 24 Cans of beer per week   ? Drug use: No   ? Sexual activity: Not on file   Other Topics Concern   ? Not on file   Social History Narrative   ? Not on file     Social Determinants of Health     Financial Resource Strain:    ? Difficulty of Paying Living Expenses: Not on file   Food Insecurity:    ? Worried About Running Out of Food in the Last Year: Not on file   ? Ran Out of Food in the Last Year: Not on file   Transportation Needs:    ? Lack of Transportation (Medical): Not on file   ? Lack of Transportation (Non-Medical): Not on file   Physical Activity:    ? Days of Exercise per Week: Not on file   ? Minutes of Exercise per Session: Not on file   Stress:    ? Feeling of Stress : Not on file   Social Connections:    ? Frequency of Communication with Friends and Family: Not on file   ? Frequency of Social Gatherings with Friends and Family: Not on file   ? Attends Religious Services: Not on file   ? Active Member of Clubs or Organizations: Not on file   ? Attends Banker Meetings: Not on file   ? Marital Status: Not on file     Additional Social History:      Allergies:  No Known Allergies    Labs:    Results for orders placed or performed during the hospital encounter of 02/15/20 (from the past 48 hour(s))   Comprehensive metabolic panel  Status: Abnormal    Collection Time: 02/15/20 10:31 AM   Result Value Ref Range    Sodium 136 135 - 145 mmol/L    Potassium 3.9 3.5 - 5.1 mmol/L    Chloride 97 (L) 98 - 111 mmol/L    CO2 22 22 - 32 mmol/L    Glucose, Bld 116 (H) 70 - 99 mg/dL     Comment: Glucose reference range applies only to samples taken after fasting for at least 8 hours.    BUN 7 6 - 20 mg/dL    Creatinine, Ser 1.61 0.61 - 1.24 mg/dL    Calcium 9.1 8.9 - 09.6 mg/dL    Total Protein 8.9 (H) 6.5 - 8.1 g/dL    Albumin 4.8 3.5 - 5.0 g/dL    AST 57 (H) 15 - 41 U/L    ALT 79 (H) 0 - 44 U/L    Alkaline Phosphatase 125 38 - 126 U/L    Total Bilirubin 1.3 (H) 0.3 - 1.2 mg/dL    GFR calc non Af Amer >60 >60 mL/min    GFR calc Af Amer >60 >60 mL/min    Anion gap 17 (H) 5 - 15     Comment: Performed at [facility], [address] [place], [place] [address]   Ethanol     Status: Abnormal    Collection Time: 02/15/20 10:31 AM   Result Value Ref Range    Alcohol, Ethyl (B) 317 (HH) <10 mg/dL     Comment: CRITICAL RESULT CALLED TO, READ BACK BY AND VERIFIED WITH  STEPHANIE RUDD 02/15/20 1117 [facility]  (NOTE)  Lowest detectable limit for serum alcohol is 10 mg/dL.    For medical purposes only.  Performed at [facility], [address] [place],  [place] [address]    Salicylate level     Status: Abnormal    Collection Time: 02/15/20 10:31 AM   Result Value Ref Range    Salicylate Lvl <7.0 (L) 7.0 - 30.0 mg/dL     Comment: Performed at [facility], [address] [place], [place] [address]   Acetaminophen level     Status: Abnormal    Collection Time: 02/15/20 10:31 AM   Result Value Ref Range    Acetaminophen (Tylenol), Serum <10 (L) 10 - 30 ug/mL     Comment: (NOTE)  Therapeutic concentrations vary significantly. A range of 10-30 ug/mL   may be an effective concentration for many  patients. However, some   are best treated at concentrations outside of this range.  Acetaminophen concentrations >150 ug/mL at 4 hours after ingestion   and >50 ug/mL at 12 hours after ingestion are often associated with   toxic reactions.    Performed at [facility], [address] [place],  [place] [address]    cbc     Status: Abnormal    Collection Time: 02/15/20 10:31 AM   Result Value Ref Range    WBC 12.3 (H) 4.0 - 10.5 K/uL    RBC 5.79 4.22 - 5.81 MIL/uL    Hemoglobin 18.6 (H) 13.0 - 17.0 g/dL    HCT 30.8 (H) 39 - 52 %    MCV 91.2 80.0 - 100.0 fL    MCH 32.1 26.0 - 34.0 pg    MCHC 35.2 30.0 - 36.0 g/dL    RDW 65.7 84.6 - 96.2 %    Platelets 245 150 - 400 K/uL    nRBC 0.0 0.0 - 0.2 %  Comment: Performed at [facility], [address] [place], [place] [address]   Urine Drug Screen, Qualitative     Status: None    Collection Time: 02/15/20 10:31 AM   Result Value Ref Range    Tricyclic, Ur Screen NONE DETECTED NONE DETECTED    Amphetamines, Ur Screen NONE DETECTED NONE DETECTED    MDMA (Ecstasy)Ur Screen NONE DETECTED NONE DETECTED    Cocaine Metabolite,Ur Sc NONE DETECTED NONE DETECTED    Opiate, Ur Screen NONE DETECTED NONE DETECTED    Phencyclidine (PCP) Ur S NONE DETECTED NONE DETECTED    Cannabinoid 50 Ng, Ur Sc NONE DETECTED NONE DETECTED    Barbiturates, Ur Screen NONE DETECTED NONE DETECTED    Benzodiazepine, Ur Scrn NONE DETECTED NONE DETECTED    Methadone Scn, Ur NONE DETECTED NONE DETECTED     Comment: (NOTE)  Tricyclics + metabolites, urine    Cutoff 1000 ng/mL  Amphetamines + metabolites, urine  Cutoff 1000 ng/mL  MDMA (Ecstasy), urine              Cutoff 500 ng/mL  Cocaine Metabolite, urine          Cutoff 300 ng/mL  Opiate + metabolites, urine        Cutoff 300 ng/mL  Phencyclidine (PCP), urine         Cutoff 25 ng/mL  Cannabinoid, urine                 Cutoff 50 ng/mL  Barbiturates + metabolites, urine  Cutoff 200 ng/mL  Benzodiazepine, urine              Cutoff 200  ng/mL  Methadone, urine                   Cutoff 300 ng/mL    The urine drug screen provides only a preliminary, unconfirmed  analytical test result and should not be used for non-medical  purposes. Clinical consideration and professional judgment should  be applied to any positive drug screen result due to possible  interfering substances. A more specific alternate chemical method  must be used in order to obtain a confirmed analytical result.  Gas chromatography / mass spectrometry (GC/MS) is the preferred  confirm atory method.  Performed at [facility], [address], [place],  [place] [address]      Current Facility-Administered Medications   Medication Dose Route Frequency Provider Last Rate Last Admin   ? LORazepam (ATIVAN) injection 0-4 mg  0-4 mg Intravenous Q6H Jene Every, MD        Or   ? LORazepam (ATIVAN) tablet 0-4 mg  0-4 mg Oral Q6H Jene Every, MD       ? Melene Muller ON 02/17/2020] LORazepam (ATIVAN) injection 0-4 mg  0-4 mg Intravenous Conception Oms, MD        Or   ? Melene Muller ON 02/17/2020] LORazepam (ATIVAN) tablet 0-4 mg  0-4 mg Oral Q12H Jene Every, MD       ? nicotine (NICODERM CQ - dosed in mg/24 hours) patch 21 mg  21 mg Transdermal Daily Jene Every, MD   21 mg at 02/15/20 1424   ? thiamine tablet 100 mg  100 mg Oral Daily Jene Every, MD   100 mg at 02/15/20 1424    Or   ? thiamine (B-1) injection 100 mg  100 mg Intravenous Daily Jene Every, MD         Current Outpatient Medications   Medication Sig  Dispense Refill   ? amLODipine (NORVASC) 5 MG tablet Take 1 tablet (5 mg total) by mouth daily. 30 tablet 0   ? chlordiazePOXIDE (LIBRIUM) 25 MG capsule Take 1 tablet (25mg ) by mouth 4 times a day for 2 days, then take 1 tablet by mouth 3 times a day for 2 days, then take 1 tablet by mouth twice a day for 2 days, then take 1 tablet by mouth once a day for 2 days 20 capsule 0   ? escitalopram (LEXAPRO) 20 MG tablet Take 1 tablet by mouth daily.     ? lisinopril  (ZESTRIL) 10 MG tablet Take 1 tablet by mouth daily.     ? naproxen (NAPROSYN) 500 MG tablet Take 1 tablet (500 mg total) by mouth 2 (two) times daily with a meal. 20 tablet 00   ? sulfamethoxazole-trimethoprim (BACTRIM DS) 800-160 MG tablet Take 1 tablet by mouth 2 (two) times daily. 20 tablet 0     Musculoskeletal:  Strength & Muscle Tone: decreased  Gait & Station: normal  Patient leans: N/A    Psychiatric Specialty Exam:  Physical Exam  Vitals and nursing note reviewed.   Constitutional:       Appearance: Normal appearance.   HENT:      Head: Normocephalic.      Nose: Nose normal.   Musculoskeletal:         General: Normal range of motion.      Cervical back: Normal range of motion.   Neurological:      General: No focal deficit present.      Mental Status: He is alert and oriented to person, place, and time.   Psychiatric:         Attention and Perception: Perception normal. He is inattentive.         Mood and Affect: Mood is anxious and depressed.         Speech: Speech normal.         Behavior: Behavior normal. Behavior is cooperative.         Thought Content: Thought content includes suicidal ideation. Thought content includes suicidal plan.         Cognition and Memory: Cognition and memory normal.         Judgment: Judgment normal.       Review of Systems   Psychiatric/Behavioral: Positive for dysphoric mood and suicidal ideas. The patient is nervous/anxious.    All other systems reviewed and are negative.    Blood pressure (!) 168/98, pulse (!) 108, temperature 98.4 F (36.9 C), resp. rate 14, SpO2 98 %.There is no height or weight on file to calculate BMI.   General Appearance: Disheveled   Eye Contact:  Fair   Speech:  Normal Rate   Volume:  Normal   Mood:  Depressed, irritable   Affect:  Congruent   Thought Process:  Coherent and Descriptions of Associations: Intact   Orientation:  Full (Time, Place, and Person)   Thought Content:  Rumination   Suicidal Thoughts:  Yes.  with intent/plan   Homicidal  Thoughts:  No   Memory:  Immediate;   Fair  Recent;   Fair  Remote;   Fair   Judgement:  Impaired   Insight:  Fair   Psychomotor Activity:  Decreased   Concentration:  Concentration: Fair and Attention Span: Fair   Recall:  [employer] of Knowledge:  Fair   Language:  Good   Akathisia:  No   Handed:  Right   AIMS (if indicated):      Assets:  Housing  Leisure Time  Physical Health  Resilience   ADL's:  Intact   Cognition:  WNL   Sleep:        Treatment Plan Summary:  Daily contact with patient to assess and evaluate symptoms and progress in treatment, Medication management and Plan major depressive disorder, recurrent,severe without psychosis:   -[facility]    Alcohol use disorder, severe:  -Ativan alcohol detox started    Disposition: Recommend psychiatric Inpatient admission when medically cleared.    Nanine Means, NP  02/15/2020 3:07 PM  Electronically signed by Nelly Rout, MD at 02/23/2020  3:11 PM EDT

## (undated) NOTE — ED Notes (Signed)
Formatting of this note might be different from the original.  Provider paged  Electronically signed by Casper Harrison, RN at 07/07/2020  5:50 AM EST

## (undated) NOTE — Progress Notes (Signed)
Formatting of this note is different from the original.  Physical Therapy Treatment  Patient Details  Name: Jeffrey Reese  MRN: [id number]  DOB: [birth year]  Today's Date: 07/12/2020    History of Present Illness Pt is a 14 y.o. male with a PMH of suicidal ideation, HTN, and alcohol dependence who presents to the hospital 2/2 intentional overdose of Seroquel 6000 mg. EtOH present on blood work. Per chart, pt is homeless and had told family and friends goodbye prior to this event. Pt with acute toxic metabolic encephalpathy in setting of OD. EEG negative for seizures. CT negative for acute intracranial abnormalities.      PT Comments      Patient reports feeling well today except having some discomfort in foot due to blisters and right shoulder discomfort with flexion/abduction AROM. Scored 23/24 on DGI indicating pt is not at increased risk for falls. Tolerated higher level balance challenges- walking backwards, changes in direction, head turns, changes in gait speed without difficulties or LOB. Tolerated stairs with Mod I holding onto rail for support and cues to decrease speed for safety. Worked on scapular mobilizations to try to help increase right shoulder AROM.  Encouraged walking with sitter while in the hospital to maintain strength. All education completed and goals met or adequate for d/c. Pt no longer requires skilled therapy services as pt functioning at mod I-independent level. Discharge from therapy.    Follow Up Recommendations   No PT follow up      Equipment Recommendations   None recommended by PT     Recommendations for Other Services        Precautions / Restrictions Precautions  Precautions: None  Precaution Comments: 1:1 sitter, suicide risk, flight risk  Restrictions  Weight Bearing Restrictions: No      Mobility   Bed Mobility  Overal bed mobility: Needs Assistance  Bed Mobility: Supine to Sit;Sit to Supine      Supine to sit: Independent  Sit to supine: Independent    General bed mobility  comments: No assist needed.    Transfers  Overall transfer level: Independent  Equipment used: None  Transfers: Sit to/from Stand  Sit to Stand: Independent          General transfer comment: Able to stand from EOB without difficulty. Stood from [employer], from chair x1.    Ambulation/Gait  Ambulation/Gait assistance: Independent  Gait Distance (Feet): 400 Feet  Assistive device: None  Gait Pattern/deviations: WFL(Within Functional Limits)  Gait velocity: WFL  Gait velocity interpretation: >4.37 ft/sec, indicative of normal walking speed  General Gait Details: Steady gait; toleratd higher level balance challenges without difficulty or LOB.    Stairs  Stairs: Yes  Stairs assistance: Modified independent (Device/Increase time)  Stair Management: Alternating pattern;One rail Right  Number of Stairs: 13  General stair comments: Pt ascending staitrs at safe speed but going fast down them without any difficulty.    Wheelchair Mobility      Modified Rankin (Stroke Patients Only)  Modified Rankin (Stroke Patients Only)  Pre-Morbid Rankin Score: No symptoms  Modified Rankin: Slight disability      Balance Overall balance assessment: Needs assistance  Sitting-balance support: Feet supported;No upper extremity supported  Sitting balance-Leahy Scale: Good      Standing balance support: During functional activity  Standing balance-Leahy Scale: Good    Single Leg Stance - Right Leg: 30 (increased ankle strategy)  Single Leg Stance - Left Leg: 30  Tandem Stance - Right Leg: 30  Tandem Stance - Left Leg: 30      High level balance activites: Backward walking;Direction changes;Turns;Sudden stops;Head turns  High Level Balance Comments: Tolerated above with no deviations in gait or difficulty with LOB.  Standardized Balance Assessment  Standardized Balance Assessment : Dynamic Gait Index    Dynamic Gait Index  Level Surface: Normal  Change in Gait Speed: Normal  Gait with Horizontal Head Turns: Normal  Gait with Vertical Head Turns:  Normal  Gait and Pivot Turn: Normal  Step Over Obstacle: Normal  Step Around Obstacles: Normal  Steps: Mild Impairment  Total Score: 23        Cognition Arousal/Alertness: Awake/alert  Behavior During Therapy: WFL for tasks assessed/performed  Overall Cognitive Status: Within Functional Limits for tasks assessed                                  General Comments: Cognition appears much improved.        Exercises        General Comments General comments (skin integrity, edema, etc.): Continues to report blister discomfort. Performed some scapular mobilization to try to help increase shoulder AROM in flexion/abduction.        Pertinent Vitals/Pain Pain Assessment: Faces  Faces Pain Scale: Hurts little more  Pain Location: Rt shoulder with flexion AROM and palpation; L heel with mobility  Pain Descriptors / Indicators: Grimacing;Guarding;Sore  Pain Intervention(s): Monitored during session;Repositioned     Home Living                      Prior Function             PT Goals (current goals can now be found in the care plan section) Progress towards PT goals: Goals met/education completed, patient discharged from PT      Frequency     Min 3X/week      PT Plan Current plan remains appropriate     Co-evaluation                AM-PAC PT "6 Clicks" Mobility    Outcome Measure   Help needed turning from your back to your side while in a flat bed without using bedrails?: None  Help needed moving from lying on your back to sitting on the side of a flat bed without using bedrails?: None  Help needed moving to and from a bed to a chair (including a wheelchair)?: None  Help needed standing up from a chair using your arms (e.g., wheelchair or bedside chair)?: None  Help needed to walk in hospital room?: None  Help needed climbing 3-5 steps with a railing? : A Little  6 Click Score: 23      End of Session    Activity Tolerance: Patient tolerated treatment well  Patient left: with nursing/sitter in room Psychiatrist)  Nurse Communication:  Mobility status  PT Visit Diagnosis: Unsteadiness on feet (R26.81);Other abnormalities of gait and mobility (R26.89);Ataxic gait (R26.0);Difficulty in walking, not elsewhere classified (R26.2)      Time: 0943-1000  PT Time Calculation (min) (ACUTE ONLY): 17 min    Charges:  $Gait Training: 8-22 mins               Vale Haven, PT, DPT  Acute Rehabilitation Services  Pager [phone]  Office [phone]    Marcy Panning  07/12/2020, 10:25 AM      Electronically signed by Lanier Ensign,  Shauna A, PT at 07/12/2020 10:27 AM EST

## (undated) NOTE — ED Notes (Signed)
Formatting of this note might be different from the original.  Hourly rounding reveals patient asleep in room. No complaints, stable, in no acute distress. Q15 minute rounds and monitoring via Psychologist, counselling to continue.    Electronically signed by Rudene Re, RN at 02/15/2020  8:03 PM EDT

## (undated) NOTE — Progress Notes (Signed)
Formatting of this note might be different from the original.  Patient decline tele-psych visit, prefers in person evaluation. Will attempt to reassess in person tomorrow. Patient continues to meet inpatient psychiatric evaluation. He remains high risk for suicide completion.   Electronically signed by Maryagnes Amos, FNP at 07/12/2020  8:13 PM EST

## (undated) NOTE — Unmapped External Note (Signed)
Formatting of this note might be different from the original.    Problem: Medication:  Goal: Risk for medication side effects will decrease  Outcome: Progressing    Problem: Clinical Measurements:  Goal: Complications related to the disease process, condition or treatment will be avoided or minimized  Outcome: Progressing  Goal: Diagnostic test results will improve  Outcome: Progressing    Problem: Safety:  Goal: Verbalization of understanding the information provided will improve  Outcome: Progressing    Problem: Self-Concept:  Goal: Level of anxiety will decrease  Outcome: Progressing  Goal: Ability to verbalize feelings about condition will improve  Outcome: Progressing    Electronically signed by Thom Chimes, RN at 07/08/2020  9:24 AM EST

## (undated) NOTE — Progress Notes (Signed)
Formatting of this note might be different from the original.  EEG complete - results pending    Electronically signed by Charlsie Quest, MD at 07/07/2020  8:25 AM EST

## (undated) NOTE — Unmapped External Note (Signed)
Formatting of this note might be different from the original.    Problem: Clinical Measurements:  Goal: Will remain free from infection  Outcome: Progressing    Problem: Health Behavior/Discharge Planning:  Goal: Ability to manage health-related needs will improve  Outcome: Progressing    Problem: Education:  Goal: Knowledge of General Education information will improve  Description: Including pain rating scale, medication(s)/side effects and non-pharmacologic comfort measures  Outcome: Progressing    Electronically signed by Ashley Royalty, RN at 07/14/2020  3:07 AM EST

## (undated) NOTE — Unmapped External Note (Signed)
Formatting of this note is different from the original.  Comprehensive Clinical Assessment (CCA) Note    10/21/2020  Jeffrey Reese  [id number]     Disposition: [name], FNP recommends in patient treatment once medically clear. CSW to seek placement. Patient poses high risk for suicide and 1:1 sitter is recommended.    The patient demonstrates the following risk factors for suicide: Chronic risk factors for suicide include: psychiatric disorder of Bipolar I, previous suicide attempts several and demographic factors (male, >32 y/o). Acute risk factors for suicide include: family or marital conflict and loss (financial, interpersonal, professional). Protective factors for this patient include: positive social support. Considering these factors, the overall suicide risk at this point appears to be high. Patient is not appropriate for outpatient follow up.    Flowsheet Row ED to Hosp-Admission (Current) from 10/19/2020 in [facility] Admission (Discharged) from 07/14/2020 in [facility] to Hosp-Admission (Discharged) from 07/06/2020 in [facility]   C-SSRS RISK CATEGORY High Risk Error: Q3, 4, or 5 should not be populated when Q2 is No Error: Q3, 4, or 5 should not be populated when Q2 is No       Patient is a 11 year old male presenting voluntarily to [facility] via EMS after an intentional overdose on Seroquel. Patient required intubation and admission to ICU. Upon this counselor's exam patient is calm and cooperative. Patient reports he intentionally overdosed on 45 400 mg of Seroquel. Patient reports is diagnosed with Bipolar and was seeing Jeffrey Sievert, PA in the past but discontinued services with him because everything was virtual and he did not find it helpful. Patient ran out of his Depakote and states that's why he was not thinking clearly, triggering his overdose. He denies SI/HI/AVH at this time. Per chart review patient has a history of multiple  admissions of intentional overdose, most recently at [facility] in February. He reports daily alcohol use. He states he drinks anywhere from 3-4 beers on a work day up to a 12 pack when he does not have to work. He denies any other substance use.    Chief Complaint:   Chief Complaint   Patient presents with   ? Drug Overdose     Visit Diagnosis: F31.4 Bipolar I, current episode depressed, severe    CCA Biopsychosocial  Intake/Chief Complaint:  NA    Current Symptoms/Problems: NA    Patient Reported Schizophrenia/Schizoaffective Diagnosis in Past: No    Strengths: NA    Preferences: NA    Abilities: NA    Type of Services Patient Feels are Needed: NA    Initial Clinical Notes/Concerns: NA    Mental Health Symptoms  Depression:  Difficulty Concentrating; Hopelessness; Sleep (too much or little); Tearfulness    Duration of Depressive symptoms: Greater than two weeks    Mania:  Increased Energy; Irritability; Racing thoughts; Recklessness    Anxiety:   Tension; Worrying; Difficulty concentrating    Psychosis:  None    Duration of Psychotic symptoms: No data recorded   Trauma:  None    Obsessions:  None    Compulsions:  None    Inattention:  None    Hyperactivity/Impulsivity:  N/A    Oppositional/Defiant Behaviors:  N/A    Emotional Irregularity:  Intense/unstable relationships; Potentially harmful impulsivity; Transient, stress-related paranoia/disassociation    Other Mood/Personality Symptoms:  No data recorded     Mental Status Exam  Appearance and self-care   Stature:  Average    Weight:  Average weight    Clothing:  Neat/clean    Grooming:  Normal    Cosmetic use:  None    Posture/gait:  Slumped    Motor activity:  Not Remarkable    Sensorium   Attention:  Normal    Concentration:  Normal    Orientation:  X5    Recall/memory:  Normal    Affect and Mood   Affect:  Flat    Mood:  Depressed    Relating   Eye contact:  Normal    Facial expression:  Depressed    Attitude toward examiner:  Cooperative    Thought and Language    Speech flow: Clear and Coherent    Thought content:  Appropriate to Mood and Circumstances    Preoccupation:  None    Hallucinations:  None    Organization:  No data recorded   [employer] of Knowledge:  Good    Intelligence:  Average    Abstraction:  Normal    Judgement:  Impaired    Reality Testing:  Realistic    Insight:  Lacking    Decision Making:  Impulsive    Social Functioning   Social Maturity:  Impulsive    Social Judgement:  Heedless    Stress   Stressors:  Housing; Relationship; Illness    Coping Ability:  Deficient supports    Skill Deficits:  Decision making; Interpersonal; Self-control    Supports:  Support needed      Religion:  Religion/Spirituality  Are You A Religious Person?: No    Leisure/Recreation:  Leisure / Recreation  Do You Have Hobbies?: No    Exercise/Diet:  Exercise/Diet  Do You Exercise?: No  Have You Gained or Lost A Significant Amount of Weight in the Past Six Months?: No  Do You Follow a Special Diet?: No  Do You Have Any Trouble Sleeping?: Yes  Explanation of Sleeping Difficulties: reports if he does not take Seroquel poor sleep    CCA Employment/Education  Employment/Work Situation:  Employment / Work Situation  Employment situation: Employed  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 [employer] long has patient been employed?: 1 year  Patient's job has been impacted by current illness: No  What is the longest time patient has a held a job?: NA  Where was the patient employed at that time?: NA  Has patient ever been in the [employer]?: No    Education:  Education  Is Patient Currently Attending School?: No  Last Grade Completed: 12  Name of High School: NA- school in [place]  Did You Graduate From [employer]?: Yes  Did Theme park manager?: No  Did Designer, television/film set?: No  Did You Have An Individualized Education Program (IIEP): No  Did You Have Any Difficulty At [employer]?: No  Patient's Education Has Been Impacted by Current Illness:  No    CCA Family/Childhood History  Family and Relationship History:  Family history  Marital status: Long term relationship  Long term relationship, how long?: UTA  What types of issues is patient dealing with in the relationship?: frequent fights  Are you sexually active?: Yes  What is your sexual orientation?: heterosexual  Has your sexual activity been affected by drugs, alcohol, medication, or emotional stress?: NA  Does patient have children?: No    Childhood History:   Childhood History  By whom was/is the patient raised?: Mother  Additional childhood history information:  grew up in [place]  Description of patient's relationship with caregiver when they were a child: NA  Patient's description of current relationship with people who raised him/her: NA  How were you disciplined when you got in trouble as a child/adolescent?: mother is a support, but still lives in [place]  Does patient have siblings?: No  Did patient suffer any verbal/emotional/physical/sexual abuse as a child?: No  Did patient suffer from severe childhood neglect?: No  Has patient ever been sexually abused/assaulted/raped as an adolescent or adult?: No  Was the patient ever a victim of a crime or a disaster?: No  Witnessed domestic violence?: No  Has patient been affected by domestic violence as an adult?: No    Child/Adolescent Assessment:      CCA Substance Use  Alcohol/Drug Use:  Alcohol / Drug Use  Pain Medications: See MAR  Prescriptions: See MAR  Over the Counter: See MAR  History of alcohol / drug use?: Yes  Substance #1  Name of Substance 1: alcohol  1 - Age of First Use: teens  1 - Amount (size/oz): 4 beers- 12 pack  1 - Frequency: daily  1 - Duration: "along time"  1 - Last Use / Amount: 4/27 UTA amount  1 - Method of Aquiring: purcahsed  1- Route of Use: drink                      ASAM's:  Six Dimensions of Multidimensional Assessment    Dimension 1:  Acute Intoxication and/or Withdrawal Potential:    Dimension 1:  Description of individual's  past and current experiences of substance use and withdrawal: daily alcohol use   Dimension 2:  Biomedical Conditions and Complications:    Dimension 2:  Description of patient's biomedical conditions and  complications: high blood pressure   Dimension 3:  Emotional, Behavioral, or Cognitive Conditions and Complications:  Dimension 3:  Description of emotional, behavioral, or cognitive conditions and complications: bipolar disorder   Dimension 4:  Readiness to Change:  Dimension 4:  Description of Readiness to Change criteria: precontemplative   Dimension 5:  Relapse, Continued use, or Continued Problem Potential:  Dimension 5:  Relapse, continued use, or continued problem potential critiera description: does not view use as an issue   Dimension 6:  Recovery/Living Environment:  Dimension 6:  Recovery/Iiving environment criteria description: homelss, living in different hotels   ASAM Severity Score: ASAM's Severity Rating Score: 13   ASAM Recommended Level of Treatment: ASAM Recommended Level of Treatment: Level II Intensive Outpatient Treatment     Substance use Disorder (SUD)  Substance Use Disorder (SUD)  Checklist  Symptoms of Substance Use: Continued use despite having a persistent/recurrent physical/psychological problem caused/exacerbated by use,Evidence of tolerance,Evidence of withdrawal (Comment),Presence of craving or strong urge to use    Recommendations for Services/Supports/Treatments:  Recommendations for Services/Supports/Treatments  Recommendations For Services/Supports/Treatments: IOP (Intensive Outpatient Program)    DSM5 Diagnoses:  Patient Active Problem List    Diagnosis Date Noted   ? Bipolar disorder (HCC) 10/20/2020   ? Overdose 10/19/2020   ? History of alcohol use disorder 10/19/2020   ? Acute respiratory failure (HCC) 10/19/2020   ? Transaminitis 10/19/2020   ? Hypothermia 10/19/2020   ? Severe bipolar I disorder, current or most recent episode depressed (HCC) 07/14/2020   ? Intentional  overdose of drug in tablet form (HCC) 07/06/2020   ? Essential hypertension 07/06/2020   ? Tobacco dependence 07/06/2020   ? Class 1 obesity due  to excess calories with body mass index (BMI) of 30.0 to 30.9 in adult 07/06/2020   ? 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 Alcohol use disorder, severe, dependence (HCC) 02/15/2020   ? Delirium tremens (HCC) 12/16/2018   ? Alcohol abuse 06/27/2018     Patient Centered Plan:  Patient is on the following Treatment Plan(s):      Referrals to Alternative Service(s):  Referred to Alternative Service(s):   Place:   Date:   Time:     Referred to Alternative Service(s):   Place:   Date:   Time:     Referred to Alternative Service(s):   Place:   Date:   Time:     Referred to Alternative Service(s):   Place:   Date:   Time:       Celedonio Miyamoto, LCSW  Electronically signed by Celedonio Miyamoto, LCSW at 10/21/2020 11:52 AM EDT

## (undated) NOTE — ED Notes (Signed)
Formatting of this note might be different from the original.  Dressed out into burgundy scrubs from grey shirt, blue underpants, black shoes, black belt, grey pants.  Electronically signed by Janey Genta, NT at 02/15/2020 10:43 AM EDT

## (undated) NOTE — ED Notes (Signed)
Formatting of this note might be different from the original.  Hourly rounding reveals patient sleeping in room. No complaints, stable, in no acute distress. Q15 minute rounds and monitoring via Psychologist, counselling to continue.    Electronically signed by Rudene Re, RN at 02/15/2020  9:21 PM EDT

## (undated) NOTE — Progress Notes (Signed)
Formatting of this note is different from the original.  Neurology Progress Note    S://    Patient is seen and examined today.  He is alert and oriented to self, age, knows the year, month and date.   Today, he stated that he took 25 tablets of BuSpar only.  He states that he has some pain and weakness in right arm and he is not able to keep it up against gravity.     O://  Current vital signs:  BP (!) 145/98 (BP Location: Left Arm)   Pulse 94   Temp 98.2 F (36.8 C) (Oral)   Resp 18   Ht 5\' 10"  (1.778 m)   Wt 95.3 kg   SpO2 98%   BMI 30.13 kg/m   Vital signs in last 24 hours:  Temp:  [97.3 F (36.3 C)-98.5 F (36.9 C)] 98.2 F (36.8 C) (01/14 0756)  Pulse Rate:  [90-108] 94 (01/14 0756)  Resp:  [18-20] 18 (01/14 0756)  BP: (126-149)/(89-108) 145/98 (01/14 0756)  SpO2:  [97 %-99 %] 98 % (01/14 0756)  GENERAL: Awake, alert in NAD  HEENT: - Normocephalic and atraumatic, dry mucous membranes.  LUNGS - Symmetrical chest rise, No labored breathing noted  CV - no JVD, No Peripheral Edema  ABDOMEN - Soft,  nondistended   Ext: warm, well perfused, intact peripheral pulses, no Peripheral edema.  NEURO:   Mental Status: AA&Ox4  Oriented to self, age, place, and why Pt  is here.  Good attention and Concentration.   Language: speech is mildly dysarthric (mostly due to dry mouth).  Pt is able to name simple objects, repetition intact,  fluency, and comprehension intact.  Cranial Nerves: PERRL, EOMI, visual fields full, no facial asymmetry, facial sensation intact, hearing intact, tongue/uvula/soft palate midline, normal sternocleidomastoid and trapezius muscle strength. No evidence of tongue atrophy or fibrillations  Motor: Can move Rt arm up against gravity for few seconds only but much lower as compared to Lt arm. Pt not able to keep Rt hand up against gravity for long and has painful restrictive movement at Rt shoulder and arm. Drift in Rt upper extremity noted, No drift in Lt UE. No Drift in b/l LE's. Strength  3+/5 in Rt UE, 5/5 in Lt UE, Strength in Rt LE  5/5, Lt LE 5/5  Reflexes- UE -2+, LE- Mildly brisk , B/L Planters- Down going  Tone: is normal and bulk is normal  Sensation- Intact to light touch on Lt UE, Decreased on RUE, Intact on LUE and symmetrical on B/L LE's.    Coordination: FTN intact let side, Not able to assess with RT hand because of weakness and restriction due to pain.  , no ataxia in BLE.  Gait- deferred    Medications    Current Facility-Administered Medications:   ?  acetaminophen (TYLENOL) tablet 650 mg, 650 mg, Oral, Q6H PRN **OR** acetaminophen (TYLENOL) suppository 650 mg, 650 mg, Rectal, Q6H PRN, Jonah Blue, MD  ?  amLODipine (NORVASC) tablet 10 mg, 10 mg, Oral, Daily, Arrien, York Ram, MD, 10 mg at 07/09/20 0956  ?  bisacodyl (DULCOLAX) EC tablet 5 mg, 5 mg, Oral, Daily PRN, Jonah Blue, MD  ?  dextrose 5 % in lactated ringers infusion, , Intravenous, Continuous, Arrien, York Ram, MD, Last Rate: 75 mL/hr at 07/08/20 2240, New Bag at 07/08/20 2240  ?  diphenhydrAMINE (BENADRYL) injection 25 mg, 25 mg, Intravenous, Q6H PRN, Milon Dikes, MD, 25 mg at 07/07/20 2042  ?  enoxaparin (LOVENOX) injection 40 mg, 40 mg, Subcutaneous, Q24H, Jonah Blue, MD, 40 mg at 07/08/20 1704  ?  folic acid injection 1 mg, 1 mg, Intravenous, Daily, Jonah Blue, MD, 1 mg at 07/09/20 1610  ?  hydrALAZINE (APRESOLINE) injection 5 mg, 5 mg, Intravenous, Q4H PRN, Jonah Blue, MD, 5 mg at 07/07/20 2042  ?  lisinopril (ZESTRIL) tablet 10 mg, 10 mg, Oral, Daily, Arrien, York Ram, MD, 10 mg at 07/09/20 0956  ?  [EXPIRED] LORazepam (ATIVAN) injection 0-4 mg, 0-4 mg, Intravenous, Q4H, 3 mg at 07/08/20 0158 **FOLLOWED BY** LORazepam (ATIVAN) injection 0-4 mg, 0-4 mg, Intravenous, Q8H, Jonah Blue, MD  ?  LORazepam (ATIVAN) tablet 1-4 mg, 1-4 mg, Oral, Q1H PRN **OR** LORazepam (ATIVAN) injection 1-4 mg, 1-4 mg, Intravenous, Q1H PRN, Jonah Blue, MD, 2 mg at 07/07/20 2254  ?   morphine 2 MG/ML injection 2 mg, 2 mg, Intravenous, Q2H PRN, Jonah Blue, MD, 2 mg at 07/08/20 0158  ?  multivitamin with minerals tablet 1 tablet, 1 tablet, Oral, Daily, Jonah Blue, MD, 1 tablet at 07/09/20 9604  ?  naproxen (NAPROSYN) tablet 500 mg, 500 mg, Oral, BID WC, Arrien, York Ram, MD, 500 mg at 07/09/20 0956  ?  nicotine (NICODERM CQ - dosed in mg/24 hours) patch 14 mg, 14 mg, Transdermal, q1800, Jonah Blue, MD, 14 mg at 07/08/20 1705  ?  ondansetron (ZOFRAN) tablet 4 mg, 4 mg, Oral, Q6H PRN **OR** ondansetron (ZOFRAN) injection 4 mg, 4 mg, Intravenous, Q6H PRN, Jonah Blue, MD  ?  polyethylene glycol (MIRALAX / GLYCOLAX) packet 17 g, 17 g, Oral, Daily PRN, Jonah Blue, MD  ?  sodium chloride flush (NS) 0.9 % injection 3 mL, 3 mL, Intravenous, Q12H, Jonah Blue, MD, 3 mL at 07/09/20 0958  ?  thiamine 500mg  in normal saline (50ml) IVPB, 500 mg, Intravenous, Daily, Pahwani, Ravi, MD, Last Rate: 100 mL/hr at 07/09/20 1006, 500 mg at 07/09/20 1006  Labs  CBC    Component Value Date/Time    WBC 9.9 07/07/2020 0500    RBC 4.28 07/07/2020 0500    HGB 13.5 07/07/2020 0500    HGB 17.4 07/01/2014 0842    HCT 40.4 07/07/2020 0500    HCT 52.4 (H) 07/01/2014 0842    PLT 169 07/07/2020 0500    PLT 244 07/01/2014 0842    MCV 94.4 07/07/2020 0500    MCV 96 07/01/2014 0842    MCH 31.5 07/07/2020 0500    MCHC 33.4 07/07/2020 0500    RDW 13.2 07/07/2020 0500    RDW 13.7 07/01/2014 0842    LYMPHSABS 1.8 07/06/2020 1307    LYMPHSABS 1.9 07/01/2014 0842    MONOABS 0.9 07/06/2020 1307    MONOABS 0.9 07/01/2014 0842    EOSABS 0.1 07/06/2020 1307    EOSABS 0.2 07/01/2014 0842    BASOSABS 0.0 07/06/2020 1307    BASOSABS 0.0 07/01/2014 0842     CMP     Component Value Date/Time    NA 139 07/09/2020 0413    NA 138 07/01/2014 0842    K 3.9 07/09/2020 0413    K 4.2 07/01/2014 0842    CL 105 07/09/2020 0413    CL 107 07/01/2014 0842    CO2 24 07/09/2020 0413    CO2 25 07/01/2014 0842    GLUCOSE 110 (H)  07/09/2020 0413    GLUCOSE 114 (H) 07/01/2014 0842    BUN 7 07/09/2020 0413    BUN 12 07/01/2014 5409  CREATININE 0.82 07/09/2020 0413    CREATININE 1.07 07/01/2014 0842    CALCIUM 9.2 07/09/2020 0413    CALCIUM 9.0 07/01/2014 0842    PROT 6.1 (L) 07/08/2020 0324    PROT 7.5 07/01/2014 0842    ALBUMIN 3.3 (L) 07/08/2020 0324    ALBUMIN 4.2 07/01/2014 0842    AST 65 (H) 07/08/2020 0324    AST 27 07/01/2014 0842    ALT 69 (H) 07/08/2020 0324    ALT 84 (H) 07/01/2014 0842    ALKPHOS 64 07/08/2020 0324    ALKPHOS 92 07/01/2014 0842    BILITOT 1.4 (H) 07/08/2020 0324    BILITOT 1.3 (H) 07/01/2014 0842    GFRNONAA >60 07/09/2020 0413    GFRNONAA >60 07/01/2014 0842    GFRNONAA >60 05/03/2012 2215    GFRAA >60 03/09/2020 0236    GFRAA >60 07/01/2014 0842    GFRAA >60 05/03/2012 2215     glycosylated hemoglobin    Lipid Panel   No results found for: CHOL, TRIG, HDL, CHOLHDL, VLDL, LDLCALC, LDLDIRECT    Imaging  I have reviewed images in epic and the results pertinent to this consultation are:    CT-scan of the brain  Negative for acute intracranial abnormality    Assessment: Pt is a 1 year old male with PMHsignificant for hypertension, depression,anxiety with history of suicidal ideation, alcohol dependence, tobacco dependence presented with excessive movements of extremitieswithconcern for seizures. Pt overdosed on unknown amount of BuSpar+/-Seroquel. Pt is on Lorazepam taper. Valproic acid level <10, CK 804 (07/07/20),  CT negative, EEG normal. Pt is making improvement from Neurological standpoint. Pt shows some focal weakness and painful restrictive movements of Rt UE and will need X rays. This is  likely due to fall and injury to RT shoulder/arm when he overdosed. We let the primary team know about that.  No other neurological testing or work up needed from Neurological standpoint. Neurology will sign off if MRI normal.     Impression:  BuSpar +/-Seroquel  overdose  -Extrapyramidaland  Anticholinergicsymptoms  -?Alcohol withdrawal  -? Shoulder dislocation/fracture.    Recommendations:  -Supportive care per primary team  --Continue Alcohol withdrawal protocol with Ativan, thiamin and Folic acid per primary.   - Benadryl as needed, p.o.  -- Avoid Antipsychotics.  -Avoid QTC prolonging drugs.  -- Primary team was made aware about possible ? Rt Shoulder dislocation/fracture  - MRI Brain W WO contrast today-please call if any acute abnormality seen.  -- Psychiatry follow-up  Neurology will be available as needed.  Please call with questions.    Dr. Karsten Ro MD  PGY1 [facility]    Attending Neurohospitalist Addendum  Patient seen and examined with APP/Resident.  Agree with the history and physical as documented above.  Agree with the plan as documented, which I helped formulate.  I have independently reviewed the chart, obtained history, review of systems and examined the patient.I have personally reviewed pertinent head/neck/spine imaging (CT/MRI).  Please feel free to call with any questions.  ---  Milon Dikes, MD  [facility]  Pager: [phone]    If 7pm to 7am, please call on call as listed on AMION.      Electronically signed by Milon Dikes, MD at 07/09/2020 11:04 AM EST

## (undated) NOTE — Unmapped External Note (Signed)
Formatting of this note might be different from the original.    Problem: Safety:  Goal: Non-violent Restraint(s)  Outcome: Progressing    Problem: Education:  Goal: Knowledge of General Education information will improve  Description: Including pain rating scale, medication(s)/side effects and non-pharmacologic comfort measures  Outcome: Progressing    Problem: Health Behavior/Discharge Planning:  Goal: Ability to manage health-related needs will improve  Outcome: Progressing    Problem: Clinical Measurements:  Goal: Ability to maintain clinical measurements within normal limits will improve  Outcome: Progressing  Goal: Will remain free from infection  Outcome: Progressing  Goal: Diagnostic test results will improve  Outcome: Progressing  Goal: Respiratory complications will improve  Outcome: Progressing  Goal: Cardiovascular complication will be avoided  Outcome: Progressing    Problem: Activity:  Goal: Risk for activity intolerance will decrease  Outcome: Progressing    Problem: Nutrition:  Goal: Adequate nutrition will be maintained  Outcome: Progressing    Problem: Coping:  Goal: Level of anxiety will decrease  Outcome: Progressing    Problem: Elimination:  Goal: Will not experience complications related to bowel motility  Outcome: Progressing  Goal: Will not experience complications related to urinary retention  Outcome: Progressing    Problem: Pain Managment:  Goal: General experience of comfort will improve  Outcome: Progressing    Problem: Safety:  Goal: Ability to remain free from injury will improve  Outcome: Progressing    Problem: Skin Integrity:  Goal: Risk for impaired skin integrity will decrease  Outcome: Progressing    Electronically signed by Treasa School, RN at 07/08/2020  6:24 AM EST

## (undated) NOTE — ED Notes (Signed)
Formatting of this note might be different from the original.  Report to include Situation, Background, Assessment, and Recommendations received from [employer] Patient alert, warm and dry, in no acute distress. Patient denies SI, HI, AVH and pain. Patient made aware of Q15 minute rounds and Psychologist, counselling presence for their safety. Patient instructed to come to me with needs or concerns.    Electronically signed by Rudene Re, RN at 02/15/2020  7:26 PM EDT

## (undated) NOTE — Progress Notes (Signed)
Formatting of this note might be different from the original.  Patient meets psych inpatient criteria.  Referred to [facility] for possible bed placement.  CSW to continue to follow up.    Ladoris Gene MSW,LCSWA,LCASA  Clinical Social Worker   [facility] Disposition  [phone] (cell)  Electronically signed by Paulette Blanch, LCSW at 07/12/2020  3:07 PM EST

## (undated) NOTE — ED Notes (Signed)
Formatting of this note might be different from the original.  Hourly rounding reveals patient sitting in bed.  No complaints, stable, in no acute distress. Q15 minute rounds and monitoring via Psychologist, counselling to continue.  Electronically signed by Ancil Boozer, RN at 02/15/2020  5:54 PM EDT

## (undated) NOTE — Discharge Summary (Signed)
Formatting of this note is different from the original.  Physician Discharge Summary   DESAI APA UJW:[id number] DOB: Jul 24, 1980 DOA: 07/06/2020    PCP: Patient, No Pcp Per    Admit date: 07/06/2020  Discharge date: 07/12/2020    Admitted From: Home   Disposition:  Inpatient psychiatry     Recommendations for Outpatient Follow-up and new medication changes:   1. Follow up with Primary Care in 4 weeks.     Home Health: no    Equipment/Devices: no      Discharge Condition: stable   CODE STATUS: full   Diet recommendation: regular     Brief/Interim Summary:  Mr. Kirton admitted to the hospital with a working diagnosis of intentional overdose with quetiapine/ buspirone,complicated withextrapyramidal andanticholinergicsyndrome.    5 year old male with past medical history for hypertension, depression, anxiety, alcohol abuse and tobacco dependence who presents after an intentional overdose. He attempted suicide via overdose on 01/09,he called his mother to say goodbye. The following day he was delusional and disoriented.  01/11he was found down at the hotel room around 7 in the morning, apparently overdosed with unknown amount of quetiapine +/-buspirone. He received naloxone by EMS with no significant change. He was brought to the hospital for further evaluation.  On his initial physical examination blood pressure 123/112, heart rate 123, respiratory rate 16, oxygen saturation 99%, lungs clear to auscultation bilaterally, heart S1-S2, present, tachycardic, abdomen soft, no lower extremity edema.    Sodium 135, potassium 3.8, chloride 98, bicarb 23, glucose 144, BUN 6, creatinine 0.72, white count 9.5, hemoglobin 14.8, hematocrit 44.3, platelets 166.  SARS COVID-19 negative.    Chest radiograph with bibasilar atelectasis, no frank infiltrates.     EKG 120 bpm, normal axis, normal intervals, sinus rhythm, no st segment or T wave changes.    Patient placed under supportive medical care with slow improvement  of his symptoms.   Initially required 4 point restrains.     Psychiatry consulted and recommendations for inpatient psych. Patient is involuntary committed due to high suicidal risk.    Patient is now medically stable for transfer.    He has been calm and cooperative.     1. Suicidal attempt, quetiapine and buspirone overdose, complicated with extrapyramidal syndrome and anticholinergic syndrome.  Patient was admitted to the progressive care unit, he had frequent neurochecks and supportive medical care.  Further work-up with head electroencephalography showed no seizures, brain MRI with no acute changes, questionable T2 signal changes in the medial thalami to the hypothalamic region which can indicate Wernicke's encephalopathy.    Patient was placed on thiamine and multivitamins.  Initially very agitated in four-point restraints, slowly symptoms improved, at the time of discharge he has no further tremors, rigidity, decreased balance or dry mouth.    Patient was seen by psychiatry with recommendations to transfer to psychiatric hospital.  Patient was involuntarily committed due to high risk of suicidal.    2. Hypertension.  Patient was resumed on amlodipine and lisinopril.    3. Tobacco and alcohol abuse.  Patient received lorazepam per CIWA protocol, nicotine patch for nicotine replacement.  At the time of discharge no signs of acute withdrawal.    4. Hypomagnesemia/hypokalemia.  Patient received potassium chloride and magnesium sulfate, with correction of electrolytes.  His kidney function remained stable.    At discharge sodium 139, potassium 3.9, chloride 105, bicarb 24, glucose 110, BUN 7, creatinine 0.82, magnesium 2.2.    5. obesity type I, chronic pain syndrome, right  shoulder pain, suspected right rotator cuff tear.   Calculated BMI 30.1, patient received analgesia with naproxen, acetaminophen and lidocaine patch.  He received physical therapy and Occupational Therapy.  Patient will need outpatient  orthopedics for further rotator cuff tear treatment.     Discharge Diagnoses:   Principal Problem:    Intentional overdose of drug in tablet form (HCC)  Active Problems:    Major depressive disorder, recurrent severe without psychotic features (HCC)    Alcohol use disorder, severe, dependence (HCC)    Essential hypertension    Tobacco dependence    Class 1 obesity due to excess calories with body mass index (BMI) of 30.0 to 30.9 in adult    Discharge Instructions    Allergies as of 07/12/2020       Reactions    Bee Venom Anaphylaxis       Medication List     STOP taking these medications    busPIRone 10 MG tablet  Commonly known as: BUSPAR    divalproex 500 MG 24 hr tablet  Commonly known as: DEPAKOTE ER    escitalopram 20 MG tablet  Commonly known as: LEXAPRO    QUEtiapine 200 MG tablet  Commonly known as: SEROQUEL      TAKE these medications    acetaminophen 325 MG tablet  Commonly known as: TYLENOL  Take 2 tablets (650 mg total) by mouth every 6 (six) hours as needed for mild pain or moderate pain.    amLODipine 10 MG tablet  Commonly known as: NORVASC  Take 10 mg by mouth daily.    EPINEPHrine 0.3 mg/0.3 mL Soaj injection  Commonly known as: EPI-PEN  Inject 0.3 mg into the muscle as needed for anaphylaxis.    lisinopril 10 MG tablet  Commonly known as: ZESTRIL  Take 1 tablet by mouth daily.    naproxen 500 MG tablet  Commonly known as: Naprosyn  Take 1 tablet (500 mg total) by mouth 2 (two) times daily with a meal.    thiamine 100 MG tablet  Take 1 tablet (100 mg total) by mouth daily.  Start taking on: July 13, 2020        Allergies   Allergen Reactions   ? Bee Venom Anaphylaxis     Consultations:   Psychiatry     Procedures/Studies:  DG Shoulder Right    Result Date: 07/09/2020  CLINICAL DATA:  Right shoulder pain and limited range of motion. Unsure of injury. Admitted for drug overdose. EXAM: RIGHT SHOULDER - 2+ VIEW COMPARISON:  None. FINDINGS: There is no evidence of fracture or dislocation. There is no  evidence of arthropathy or other focal bone abnormality. Soft tissues are unremarkable. IMPRESSION: Negative. Electronically Signed   By: Obie Dredge M.D.   On: 07/09/2020 11:00     CT Head Wo Contrast    Result Date: 07/06/2020  CLINICAL DATA:  88 year old male found unresponsive, cerebral hemorrhage suspected. EXAM: CT HEAD WITHOUT CONTRAST TECHNIQUE: Contiguous axial images were obtained from the base of the skull through the vertex without intravenous contrast. COMPARISON:  05/03/2012 FINDINGS: Brain: No evidence of acute infarction, hemorrhage, hydrocephalus, extra-axial collection or mass lesion/mass effect. Vascular: No hyperdense vessel or unexpected calcification. Skull: Normal. Negative for fracture or focal lesion. Sinuses/Orbits: No acute finding. Other: None. IMPRESSION: No acute intracranial abnormality. Electronically Signed   By: Marliss Coots MD   On: 07/06/2020 14:30     MR BRAIN W WO CONTRAST    Result Date: 07/09/2020  CLINICAL DATA:  Brain mass or lesion. Alcohol dependence. Recent intentional overdose. EXAM: MRI HEAD WITHOUT AND WITH CONTRAST TECHNIQUE: Multiplanar, multiecho pulse sequences of the brain and surrounding structures were obtained without and with intravenous contrast. CONTRAST:  10mL GADAVIST GADOBUTROL 1 MMOL/ML IV SOLN COMPARISON:  None. FINDINGS: Brain: The brainstem and cerebellum are normal. One could question low level T2 signal in the medial thalami 2 hypothalamic region which, in this clinical setting, could indicate a degree of Wernicke encephalopathy. No evidence of focal infarction, mass, hemorrhage, hydrocephalus or extra-axial collection. After contrast administration, no abnormal enhancement occurs. Vascular: Major vessels at the base of the brain show flow. Skull and upper cervical spine: Negative Sinuses/Orbits: Right maxillary sinusitis with air-fluid level. Scattered opacified right ethmoid air cells. Orbits negative. Other: None IMPRESSION: 1. One could  question low level T2 signal in the medial thalami to hypothalamic region which, in this clinical setting, could indicate a degree of Wernicke encephalopathy. Consider thiamine administration. 2. Right maxillary sinusitis. Electronically Signed   By: Paulina Fusi M.D.   On: 07/09/2020 14:14     DG Chest Port 1 View    Result Date: 07/06/2020  CLINICAL DATA:  Cough. EXAM: PORTABLE CHEST 1 VIEW COMPARISON:  08/14/2017. FINDINGS: Mediastinum hilar structures normal. Low lung volumes with bibasilar atelectasis/infiltrates. No pleural effusion or pneumothorax. Stable cardiomegaly. No pulmonary venous congestion. Thoracic spine scoliosis. IMPRESSION: Low lung volumes with bibasilar atelectasis/infiltrates. Electronically Signed   By: Maisie Fus  Register   On: 07/06/2020 13:25     EEG adult    Result Date: 07/07/2020  Charlsie Quest, MD     07/08/2020  8:52 AM Patient Name: QUANG KUBES MRN: [id number] Epilepsy Attending: Charlsie Quest Referring Physician/Provider: Dr. Jonah Blue Date: 07/06/2020 Duration: 23.59 mins Patient history: 28 year old male with concern for seizures in the setting of intentional overdose with Seroquel. EEG to evaluate for seizures. Level of alertness: Awake AEDs during EEG study: Lorazepam Technical aspects: This EEG study was done with scalp electrodes positioned according to the 10-20 International system of electrode placement. Electrical activity was acquired at a sampling rate of 500Hz  and reviewed with a high frequency filter of 70Hz  and a low frequency filter of 1Hz . EEG data were recorded continuously and digitally stored. Description: The posterior dominant rhythm consists of 9 Hz activity of moderate voltage (25-35 uV) seen predominantly in posterior head regions, symmetric and reactive to eye opening and eye closing. At around 1800, patient was noted to have an episode where patient's left arm was extended and abducted while he was laying in the bed and suddenly had a jerking  movement with elevation of left arm as well as brief generalized whole body twitching. He then continued to have nonrhythmic whole-body movements with eyes closed. Concomitant EEG before, during and after the event did not show any EEG to suggest seizure. Hyperventilation and photic stimulation were not performed.   IMPRESSION: This study is within normal limits. No seizures or epileptiform discharges were seen throughout the recording. At around 1800, patient was noted to have an episode of jerking as described above without concomitant EEG change and was not epileptic. Priyanka Annabelle Harman        Subjective:  Patient is feeling better, no tremors, no nausea or vomiting, no chest pain or dyspnea.     Discharge Exam:  Vitals:    07/12/20 0743 07/12/20 1237   BP: 115/83 (!) 136/102   Pulse: 78 72   Resp: 18 20   Temp: 98.1 F (  36.7 C) 98.1 F (36.7 C)   SpO2: 100% 99%     Vitals:    07/12/20 0014 07/12/20 0347 07/12/20 0743 07/12/20 1237   BP: (!) 121/91 123/90 115/83 (!) 136/102   Pulse: 71 70 78 72   Resp: 19 20 18 20    Temp: 98.2 F (36.8 C) 98.1 F (36.7 C) 98.1 F (36.7 C) 98.1 F (36.7 C)   TempSrc: Oral Oral Oral Oral   SpO2: 98% 99% 100% 99%   Weight:       Height:         General: Not in pain or dyspnea .   Neurology: Awake and alert, non focal   E ENT: no pallor, no icterus, oral mucosa moist  Cardiovascular: No JVD. S1-S2 present, rhythmic, no gallops, rubs, or murmurs. No lower extremity edema.  Pulmonary: positive breath sounds bilaterally  Gastrointestinal. Abdomen soft and non tender  Skin. No rashes  Musculoskeletal: no joint deformities    The results of significant diagnostics from this hospitalization (including imaging, microbiology, ancillary and laboratory) are listed below for reference.      Microbiology:  Recent Results (from the past 240 hour(s))   Resp Panel by RT-PCR (Flu A&B, Covid) Nasopharyngeal Swab     Status: None    Collection Time: 07/06/20  1:40 PM    Specimen: Nasopharyngeal  Swab; Nasopharyngeal(NP) swabs in vial transport medium   Result Value Ref Range Status    SARS Coronavirus 2 by RT PCR NEGATIVE NEGATIVE Final     Comment: (NOTE)  SARS-CoV-2 target nucleic acids are NOT DETECTED.    The SARS-CoV-2 RNA is generally detectable in upper respiratory  specimens during the acute phase of infection. The lowest  concentration of SARS-CoV-2 viral copies this assay can detect is  138 copies/mL. A negative result does not preclude SARS-Cov-2  infection and should not be used as the sole basis for treatment or  other patient management decisions. A negative result may occur with   improper specimen collection/handling, submission of specimen other  than nasopharyngeal swab, presence of viral mutation(s) within the  areas targeted by this assay, and inadequate number of viral  copies(<138 copies/mL). A negative result must be combined with  clinical observations, patient history, and epidemiological  information. The expected result is Negative.    Fact Sheet for Patients:   BloggerCourse.com    Fact Sheet for Healthcare Providers:   SeriousBroker.it    This test is no t yet approved or cleared by the [place] FDA and   has been authorized for detection and/or diagnosis of SARS-CoV-2 by  FDA under an Emergency Use Authorization (EUA). This EUA will remain   in effect (meaning this test can be used) for the duration of the  COVID-19 declaration under Section 564(b)(1) of the Act, 21  U.S.C.section 360bbb-3(b)(1), unless the authorization is terminated   or revoked sooner.        Influenza A by PCR NEGATIVE NEGATIVE Final    Influenza B by PCR NEGATIVE NEGATIVE Final     Comment: (NOTE)  The Xpert Xpress SARS-CoV-2/FLU/RSV plus assay is intended as an aid  in the diagnosis of influenza from Nasopharyngeal swab specimens and  should not be used as a sole basis for treatment. Nasal washings and  aspirates are unacceptable for Xpert Xpress  SARS-CoV-2/FLU/RSV  testing.    Fact Sheet for Patients:  BloggerCourse.com    Fact Sheet for Healthcare Providers:  SeriousBroker.it    This test is not  yet approved or cleared by the [place] and  has been authorized for detection and/or diagnosis of SARS-CoV-2 by  FDA under an Emergency Use Authorization (EUA). This EUA will remain  in effect (meaning this test can be used) for the duration of the  COVID-19 declaration under Section 564(b)(1) of the Act, 21 U.S.C.  section 360bbb-3(b)(1), unless the authorization is terminated or  revoked.    Performed at [facility], [address] [place], [place]  [address]        Labs:  BNP (last 3 results)  No results for input(s): BNP in the last 8760 hours.  Basic Metabolic Panel:  Recent Labs   Lab 07/06/20  1307 07/06/20  1438 07/07/20  0500 07/07/20  0533 07/08/20  0324 07/09/20  0413   NA 135 135 138  --  137 139   K 3.8 3.6 4.0  --  3.3* 3.9   CL 98  --  106  --  101 105   CO2 23  --  22  --  24 24   GLUCOSE 144*  --  93  --  96 110*   BUN 6  --  <5*  --  <5* 7   CREATININE 0.72  --  0.75  --  0.63 0.82   CALCIUM 9.2  --  8.4*  --  8.9 9.2   MG  --   --   --  1.7  --  2.2     Liver Function Tests:  Recent Labs   Lab 07/06/20  1307 07/08/20  0324   AST 70* 65*   ALT 70* 69*   ALKPHOS 67 64   BILITOT 1.2 1.4*   PROT 6.9 6.1*   ALBUMIN 3.8 3.3*     No results for input(s): LIPASE, AMYLASE in the last 168 hours.  No results for input(s): AMMONIA in the last 168 hours.  CBC:  Recent Labs   Lab 07/06/20  1307 07/06/20  1438 07/07/20  0500   WBC 9.5  --  9.9   NEUTROABS 6.6  --   --    HGB 14.8 15.0 13.5   HCT 44.3 44.0 40.4   MCV 93.9  --  94.4   PLT 166  --  169     Cardiac Enzymes:  Recent Labs   Lab 07/07/20  0533   CKTOTAL 804*     BNP:  Invalid input(s): POCBNP  CBG:  Recent Labs   Lab 07/06/20  1304   GLUCAP 138*     D-Dimer  No results for input(s): DDIMER in the last 72 hours.  Hgb A1c  No results for  input(s): HGBA1C in the last 72 hours.  Lipid Profile  No results for input(s): CHOL, HDL, LDLCALC, TRIG, CHOLHDL, LDLDIRECT in the last 72 hours.  Thyroid function studies  No results for input(s): TSH, T4TOTAL, T3FREE, THYROIDAB in the last 72 hours.    Invalid input(s): FREET3  Anemia work up  No results for input(s): VITAMINB12, FOLATE, FERRITIN, TIBC, IRON, RETICCTPCT in the last 72 hours.  Urinalysis    Component Value Date/Time    COLORURINE Yellow 07/01/2014 0844    APPEARANCEUR Clear 07/01/2014 0844    LABSPEC 1.015 07/01/2014 0844    PHURINE 5.0 07/01/2014 0844    GLUCOSEU Negative 07/01/2014 0844    HGBUR Negative 07/01/2014 0844    BILIRUBINUR Negative 07/01/2014 0844    KETONESUR Negative 07/01/2014 0844    PROTEINUR Negative 07/01/2014  1610    NITRITE Negative 07/01/2014 0844    LEUKOCYTESUR Negative 07/01/2014 0844     Sepsis Labs  Invalid input(s): PROCALCITONIN,  WBC,  LACTICIDVEN  Microbiology  Recent Results (from the past 240 hour(s))   Resp Panel by RT-PCR (Flu A&B, Covid) Nasopharyngeal Swab     Status: None    Collection Time: 07/06/20  1:40 PM    Specimen: Nasopharyngeal Swab; Nasopharyngeal(NP) swabs in vial transport medium   Result Value Ref Range Status    SARS Coronavirus 2 by RT PCR NEGATIVE NEGATIVE Final     Comment: (NOTE)  SARS-CoV-2 target nucleic acids are NOT DETECTED.    The SARS-CoV-2 RNA is generally detectable in upper respiratory  specimens during the acute phase of infection. The lowest  concentration of SARS-CoV-2 viral copies this assay can detect is  138 copies/mL. A negative result does not preclude SARS-Cov-2  infection and should not be used as the sole basis for treatment or  other patient management decisions. A negative result may occur with   improper specimen collection/handling, submission of specimen other  than nasopharyngeal swab, presence of viral mutation(s) within the  areas targeted by this assay, and inadequate number of viral  copies(<138 copies/mL). A  negative result must be combined with  clinical observations, patient history, and epidemiological  information. The expected result is Negative.    Fact Sheet for Patients:   BloggerCourse.com    Fact Sheet for Healthcare Providers:   SeriousBroker.it    This test is no t yet approved or cleared by the [place] FDA and   has been authorized for detection and/or diagnosis of SARS-CoV-2 by  FDA under an Emergency Use Authorization (EUA). This EUA will remain   in effect (meaning this test can be used) for the duration of the  COVID-19 declaration under Section 564(b)(1) of the Act, 21  U.S.C.section 360bbb-3(b)(1), unless the authorization is terminated   or revoked sooner.        Influenza A by PCR NEGATIVE NEGATIVE Final    Influenza B by PCR NEGATIVE NEGATIVE Final     Comment: (NOTE)  The Xpert Xpress SARS-CoV-2/FLU/RSV plus assay is intended as an aid  in the diagnosis of influenza from Nasopharyngeal swab specimens and  should not be used as a sole basis for treatment. Nasal washings and  aspirates are unacceptable for Xpert Xpress SARS-CoV-2/FLU/RSV  testing.    Fact Sheet for Patients:  BloggerCourse.com    Fact Sheet for Healthcare Providers:  SeriousBroker.it    This test is not yet approved or cleared by the [place] FDA and  has been authorized for detection and/or diagnosis of SARS-CoV-2 by  FDA under an Emergency Use Authorization (EUA). This EUA will remain  in effect (meaning this test can be used) for the duration of the  COVID-19 declaration under Section 564(b)(1) of the Act, 21 U.S.C.  section 360bbb-3(b)(1), unless the authorization is terminated or  revoked.    Performed at [facility], [address] [place], [place]  [address]      Time coordinating discharge: 45 minutes    SIGNED:    Coralie Keens, MD   [facility]  07/12/2020, 1:42 PM      Electronically signed  by Coralie Keens, MD at 07/12/2020  1:53 PM EST

## (undated) NOTE — ED Provider Notes (Signed)
Formatting of this note might be different from the original.  -----------------------------------------  1:26 PM on 02/17/2020  -----------------------------------------    Patient has been seen and evaluated by psychiatry.  They believe the patient safe for discharge home from a psychiatric standpoint.  Patient's medical work-up is been largely nonrevealing besides an elevated ethanol level upon arrival which is now over 50 hours ago.  Patient will be discharged from the emergency department with outpatient resources.    Minna Antis, MD  02/17/20 1327    Electronically signed by Minna Antis, MD at 02/17/2020  1:27 PM EDT

## (undated) NOTE — H&P (Signed)
Formatting of this note is different from the original.  Images from the original note were not included.                                                                                 TRH H&P     Patient Demographics:      Jeffrey Reese, is a 60 y.o. male  MRN: [id number]  DOB - 1980/07/19    Admit Date - 10/19/2020    Referring MD/NP/PA: Allena Katz    Outpatient Primary MD for the patient is Pcp, No    Patient coming from: Home    Chief complaint- Overdose     HPI:      Jeffrey Reese  is a 44 y.o. male, with a chief complaint of suicidal ideation, hypertension, alcohol dependence, bipolar disorder, and more presents the ED with a chief complaint of overdose.  Patient reportedly took 2 g of Seroquel, an unknown amount of Kratom, and drinking unknown amount of alcohol before calling EMS for an overdose.  Apparently when EMS arrived at the scene patient was able to describe to them exactly what he took.  By the time he arrived in the ER he was unresponsive with agonal breathing.  6 mg of Narcan was given without any response.  Patient was intubated.  Poison control was called and advised activated charcoal, monitor QT, and that patient may have seizures.  Unfortunately due to patient intubated status he is not able to give any history at this time.    In the ED  Temp 94.8-96.3, heart rate 118, respiratory rate 13, satting 98% on the ventilator  No leukocytosis with a white blood cell count of 8.3, hemoglobin 16.8  Chemistry panel reveals a decreased bicarb at 22 and a transaminitis with an AST of 127 and ALT of 179  Respiratory panel negative  UA is not indicative of UTI but does have hematuria, urine culture pending  Valproic acid levels less than 10, alcohol level 351, UDS negative  EKG shows a heart rate of 128, sinus tach, QTC 464  Chest x-ray shows appropriate placement of ETT and NG as well as low lung volumes with bibasilar atelectasis  Admission requested for further management of overdose     Review of systems:      Patient is unable to provide review of systems secondary to acute metabolic encephalopathy and intubation      Past History of the following :     Past Medical History:   Diagnosis Date   ? Alcohol dependence (HCC)    ? 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 Hypertension    ? Suicidal ideation        Past Surgical History:   Procedure Laterality Date   ? TONSILLECTOMY        Social History:       Social History     Tobacco Use   ? Smoking status: Current Every Day Smoker   ? Smokeless tobacco: Never Used   Substance Use Topics   ? Alcohol use: Yes     Alcohol/week: 24.0 standard drinks     Types: 24  Cans of beer per week        Family History :      No family history on file.  Family history could not be reviewed secondary to patient's intubated status     Home Medications:     Prior to Admission medications    Medication Sig Start Date End Date Taking? Authorizing Provider   QUEtiapine (SEROQUEL) 200 MG tablet Take 200 mg by mouth at bedtime. 10/11/20  Yes [provider]   acetaminophen (TYLENOL) 325 MG tablet Take 2 tablets (650 mg total) by mouth every 6 (six) hours as needed for mild pain or moderate pain. 07/12/20   Arrien, York Ram, MD   amLODipine (NORVASC) 10 MG tablet Take 1 tablet (10 mg total) by mouth daily. 07/15/20   Jesse Sans, MD   cariprazine [facility]) capsule Take 1 capsule (1.5 mg total) by mouth daily. 07/15/20   Jesse Sans, MD   divalproex (DEPAKOTE) 250 MG DR tablet Take 1 tablet (250 mg total) by mouth every 12 (twelve) hours. 07/15/20   Jesse Sans, MD   EPINEPHrine 0.3 mg/0.3 mL IJ SOAJ injection Inject 0.3 mg into the muscle as needed for anaphylaxis.    [provider]   lisinopril (ZESTRIL) 10 MG tablet Take 1 tablet (10 mg total) by mouth daily. 07/15/20   Jesse Sans, MD   naproxen (NAPROSYN) 500 MG tablet Take 1 tablet (500 mg total) by mouth 2 (two) times daily with a meal. 12/09/19   Joni Reining, PA-C   predniSONE  (STERAPRED UNI-PAK 48 TAB) 5 MG (48) TBPK tablet See admin instructions. follow package directions 07/28/20   [provider]   sulfamethoxazole-trimethoprim (BACTRIM DS) 800-160 MG tablet Take 1 tablet by mouth 2 (two) times daily. 07/28/20   [provider]   triamcinolone cream (KENALOG) 0.1 % Apply topically 2 (two) times daily. 07/28/20   [provider]      Allergies:       Allergies   Allergen Reactions   ? Bee Venom Anaphylaxis      Physical Exam:     Vitals    Blood pressure 102/68, pulse 97, temperature (!) 95.7 F (35.4 C), temperature source Bladder, resp. rate 17, height 5\' 8"  (1.727 m), weight 99.8 kg, SpO2 100 %.    1.  General:  Patient is supine in bed on mechanical ventilation    2. Psychiatric:  Cannot be assessed secondary to patient's intubated status    3. Neurologic:  Not following commands secondary to sedation    4. HEENMT:   Head is atraumatic, normocephalic, pupils are reactive, neck is supple, trachea is midline    5. Respiratory :  Lungs are clear to auscultation bilaterally without wheezes, rales, rhonchi, no clubbing, no cyanosis    6. Cardiovascular :  Heart rate is slightly tachycardic, rhythm is regular, no murmurs rubs or gallops    7. Gastrointestinal:   Abdomen soft, nondistended, no palpable masses, bowel sounds active    8. Skin:   Skin is warm dry and intact without acute lesion on limited exam    9.Musculoskeletal:   No acute deformity, peripheral pulses palpated, no peripheral edema     Data Review:      CBC  Recent Labs   Lab 10/19/20  1628   WBC 8.3   HGB 16.8   HCT 50.3   PLT 200   MCV 96.5   MCH 32.2   MCHC 33.4  RDW 12.4   LYMPHSABS 3.1   MONOABS 0.8   EOSABS 0.3   BASOSABS 0.1     ------------------------------------------------------------------------------------------------------------------    Results for orders placed or performed during the hospital encounter of 10/19/20 (from the past 48 hour(s))   Comprehensive metabolic panel     Status:  Abnormal    Collection Time: 10/19/20  4:28 PM   Result Value Ref Range    Sodium 135 135 - 145 mmol/L    Potassium 3.8 3.5 - 5.1 mmol/L    Chloride 102 98 - 111 mmol/L    CO2 22 22 - 32 mmol/L    Glucose, Bld 119 (H) 70 - 99 mg/dL     Comment: Glucose reference range applies only to samples taken after fasting for at least 8 hours.    BUN 5 (L) 6 - 20 mg/dL    Creatinine, Ser 1.61 0.61 - 1.24 mg/dL    Calcium 8.5 (L) 8.9 - 10.3 mg/dL    Total Protein 7.3 6.5 - 8.1 g/dL    Albumin 4.1 3.5 - 5.0 g/dL    AST 096 (H) 15 - 41 U/L    ALT 179 (H) 0 - 44 U/L    Alkaline Phosphatase 78 38 - 126 U/L    Total Bilirubin 0.6 0.3 - 1.2 mg/dL    GFR, Estimated >04 >54 mL/min     Comment: (NOTE)  Calculated using the CKD-EPI Creatinine Equation (2021)     Anion gap 11 5 - 15     Comment: Performed at [facility], [address] [place], [place] [address]   CBC WITH DIFFERENTIAL     Status: None    Collection Time: 10/19/20  4:28 PM   Result Value Ref Range    WBC 8.3 4.0 - 10.5 K/uL    RBC 5.21 4.22 - 5.81 MIL/uL    Hemoglobin 16.8 13.0 - 17.0 g/dL    HCT 91.4 78.2 - 95.6 %    MCV 96.5 80.0 - 100.0 fL    MCH 32.2 26.0 - 34.0 pg    MCHC 33.4 30.0 - 36.0 g/dL    RDW 21.3 08.6 - 57.8 %    Platelets 200 150 - 400 K/uL    nRBC 0.0 0.0 - 0.2 %    Neutrophils Relative % 49 %    Neutro Abs 4.1 1.7 - 7.7 K/uL    Lymphocytes Relative 37 %    Lymphs Abs 3.1 0.7 - 4.0 K/uL    Monocytes Relative 9 %    Monocytes Absolute 0.8 0.1 - 1.0 K/uL    Eosinophils Relative 3 %    Eosinophils Absolute 0.3 0.0 - 0.5 K/uL    Basophils Relative 1 %    Basophils Absolute 0.1 0.0 - 0.1 K/uL    Immature Granulocytes 1 %    Abs Immature Granulocytes 0.04 0.00 - 0.07 K/uL     Comment: Performed at [facility], [address], [place], [place] [address]   Protime-INR     Status: None    Collection Time: 10/19/20  4:28 PM   Result Value Ref Range    Prothrombin Time 13.2 11.4 - 15.2 seconds    INR 1.0 0.8 - 1.2     Comment: (NOTE)  INR goal varies based on device  and disease states.  Performed at [facility], [address], [place], [place] [address]    APTT     Status: None    Collection Time: 10/19/20  4:28 PM  Result Value Ref Range    aPTT 29 24 - 36 seconds     Comment: Performed at [facility], [address] [place], [place] [address]   Urinalysis, Routine w reflex microscopic     Status: Abnormal    Collection Time: 10/19/20  4:28 PM   Result Value Ref Range    Color, Urine STRAW (A) YELLOW    APPearance CLEAR CLEAR    Specific Gravity, Urine 1.003 (L) 1.005 - 1.030    pH 5.0 5.0 - 8.0    Glucose, UA NEGATIVE NEGATIVE mg/dL    Hgb urine dipstick SMALL (A) NEGATIVE    Bilirubin Urine NEGATIVE NEGATIVE    Ketones, ur NEGATIVE NEGATIVE mg/dL    Protein, ur NEGATIVE NEGATIVE mg/dL    Nitrite NEGATIVE NEGATIVE    Leukocytes,Ua NEGATIVE NEGATIVE    RBC / HPF 0-5 0 - 5 RBC/hpf    WBC, UA 0-5 0 - 5 WBC/hpf    Bacteria, UA NONE SEEN NONE SEEN    Squamous Epithelial / LPF 0-5 0 - 5    Hyaline Casts, UA PRESENT      Comment: Performed at [facility], [address], [place], [place] [address]   Rapid urine drug screen (hospital performed)     Status: None    Collection Time: 10/19/20  4:28 PM   Result Value Ref Range    Opiates NONE DETECTED NONE DETECTED    Cocaine NONE DETECTED NONE DETECTED    Benzodiazepines NONE DETECTED NONE DETECTED    Amphetamines NONE DETECTED NONE DETECTED    Tetrahydrocannabinol NONE DETECTED NONE DETECTED    Barbiturates NONE DETECTED NONE DETECTED     Comment: (NOTE)  DRUG SCREEN FOR MEDICAL PURPOSES  ONLY.  IF CONFIRMATION IS NEEDED  FOR ANY PURPOSE, NOTIFY LAB  WITHIN 5 DAYS.    LOWEST DETECTABLE LIMITS  FOR URINE DRUG SCREEN  Drug Class                     Cutoff (ng/mL)  Amphetamine and metabolites    1000  Barbiturate and metabolites    200  Benzodiazepine                 200  Tricyclics and metabolites     300  Opiates and metabolites        300  Cocaine and metabolites        300  THC                            50  Performed at [facility], [address], [place], [place] [address]    Ethanol     Status: Abnormal    Collection Time: 10/19/20  4:28 PM   Result Value Ref Range    Alcohol, Ethyl (B) 351 (HH) <10 mg/dL     Comment: CRITICAL RESULT CALLED TO, READ BACK BY AND VERIFIED WITH:  ZIFER,M ON 10/19/20 AT 1735 BY LOY,C  (NOTE)  Lowest detectable limit for serum alcohol is 10 mg/dL.    For medical purposes only.  Performed at [facility], [address] [place], [place] [address]    Valproic acid level     Status: Abnormal    Collection Time: 10/19/20  4:28 PM   Result Value Ref Range    Valproic Acid Lvl <10 (L) 50.0 - 100.0 ug/mL     Comment: RESULTS CONFIRMED BY MANUAL DILUTION  Performed  at [facility], [address] [place], [place] [address]    CBG monitoring, ED     Status: Abnormal    Collection Time: 10/19/20  4:29 PM   Result Value Ref Range    Glucose-Capillary 122 (H) 70 - 99 mg/dL     Comment: Glucose reference range applies only to samples taken after fasting for at least 8 hours.   Lactic acid, plasma     Status: Abnormal    Collection Time: 10/19/20  4:36 PM   Result Value Ref Range    Lactic Acid, Venous 2.0 (HH) 0.5 - 1.9 mmol/L     Comment: CRITICAL RESULT CALLED TO, READ BACK BY AND VERIFIED WITH:  MYRICK,B ON 10/19/20 AT 1720 BY LOY,C  Performed at [facility], [address] [place], [place] [address]    Resp Panel by RT-PCR (Flu A&B, Covid) Nasopharyngeal Swab     Status: None    Collection Time: 10/19/20  4:36 PM    Specimen: Nasopharyngeal Swab; Nasopharyngeal(NP) swabs in vial transport medium   Result Value Ref Range    SARS Coronavirus 2 by RT PCR NEGATIVE NEGATIVE     Comment: (NOTE)  SARS-CoV-2 target nucleic acids are NOT DETECTED.    The SARS-CoV-2 RNA is generally detectable in upper respiratory  specimens during the acute phase of infection. The lowest  concentration of SARS-CoV-2 viral copies this assay can detect is  138 copies/mL. A negative result does not preclude SARS-Cov-2  infection and should not be  used as the sole basis for treatment or  other patient management decisions. A negative result may occur with   improper specimen collection/handling, submission of specimen other  than nasopharyngeal swab, presence of viral mutation(s) within the  areas targeted by this assay, and inadequate number of viral  copies(<138 copies/mL). A negative result must be combined with  clinical observations, patient history, and epidemiological  information. The expected result is Negative.    Fact Sheet for Patients:   BloggerCourse.com    Fact Sheet for Healthcare Providers:   SeriousBroker.it    This test is no t yet approved or cleared by the [place] FDA and   has been authorized for detection and/or diagnosis of SARS-CoV-2 by  FDA under an Emergency Use Authorization (EUA). This EUA will remain   in effect (meaning this test can be used) for the duration of the  COVID-19 declaration under Section 564(b)(1) of the Act, 21  U.S.C.section 360bbb-3(b)(1), unless the authorization is terminated   or revoked sooner.        Influenza A by PCR NEGATIVE NEGATIVE    Influenza B by PCR NEGATIVE NEGATIVE     Comment: (NOTE)  The Xpert Xpress SARS-CoV-2/FLU/RSV plus assay is intended as an aid  in the diagnosis of influenza from Nasopharyngeal swab specimens and  should not be used as a sole basis for treatment. Nasal washings and  aspirates are unacceptable for Xpert Xpress SARS-CoV-2/FLU/RSV  testing.    Fact Sheet for Patients:  BloggerCourse.com    Fact Sheet for Healthcare Providers:  SeriousBroker.it    This test is not yet approved or cleared by the [place] FDA and  has been authorized for detection and/or diagnosis of SARS-CoV-2 by  FDA under an Emergency Use Authorization (EUA). This EUA will remain  in effect (meaning this test can be used) for the duration of the  COVID-19 declaration under Section 564(b)(1) of the  Act, 21 U.S.C.  section 360bbb-3(b)(1), unless the authorization  is terminated or  revoked.    Performed at [facility], [address] [place], [place] [address]    Lactic acid, plasma     Status: Abnormal    Collection Time: 10/19/20  5:44 PM   Result Value Ref Range    Lactic Acid, Venous 2.1 (HH) 0.5 - 1.9 mmol/L     Comment: CRITICAL VALUE NOTED.  VALUE IS CONSISTENT WITH PREVIOUSLY REPORTED AND CALLED VALUE.  Performed at [facility], [address], [place], [place] [address]    Blood culture (routine single)     Status: None (Preliminary result)    Collection Time: 10/19/20  5:44 PM    Specimen: Site Not Specified; Blood   Result Value Ref Range    Specimen Description SITE NOT SPECIFIED     Special Requests       BOTTLES DRAWN AEROBIC AND ANAEROBIC Blood Culture adequate volume  Performed at [facility], [address] [place], [place] [address]     Culture PENDING     Report Status PENDING    Acetaminophen level     Status: Abnormal    Collection Time: 10/19/20  5:44 PM   Result Value Ref Range    Acetaminophen (Tylenol), Serum <10 (L) 10 - 30 ug/mL     Comment: (NOTE)  Therapeutic concentrations vary significantly. A range of 10-30 ug/mL   may be an effective concentration for many patients. However, some   are best treated at concentrations outside of this range.  Acetaminophen concentrations >150 ug/mL at 4 hours after ingestion   and >50 ug/mL at 12 hours after ingestion are often associated with   toxic reactions.    Performed at [facility], [address] [place], [place] [address]    Blood gas, arterial     Status: Abnormal    Collection Time: 10/19/20  6:50 PM   Result Value Ref Range    FIO2 60.00     Delivery systems VENTILATOR     Mode PRESSURE REGULATED VOLUME CONTROL     VT 540 mL    LHR 15.00 resp/min    Peep/cpap 5.0 cm H20    pH, Arterial 7.313 (L) 7.350 - 7.450    pCO2 arterial 45.5 32.0 - 48.0 mmHg    pO2, Arterial 126 (H) 83.0 - 108.0 mmHg    Bicarbonate 21.5 20.0 - 28.0 mmol/L     Acid-base deficit 2.6 (H) 0.0 - 2.0 mmol/L    O2 Saturation 97.8 %    Patient temperature 35.2     Collection site LEFT RADIAL     Drawn by [id number]     Allens test (pass/fail) PASS PASS     Comment: Performed at [facility], [address] [place], [place] [address]   Basic metabolic panel     Status: Abnormal    Collection Time: 10/19/20  9:05 PM   Result Value Ref Range    Sodium 138 135 - 145 mmol/L    Potassium 3.8 3.5 - 5.1 mmol/L    Chloride 104 98 - 111 mmol/L    CO2 20 (L) 22 - 32 mmol/L    Glucose, Bld 111 (H) 70 - 99 mg/dL     Comment: Glucose reference range applies only to samples taken after fasting for at least 8 hours.    BUN 6 6 - 20 mg/dL    Creatinine, Ser 9.62 (L) 0.61 - 1.24 mg/dL    Calcium 7.6 (L) 8.9 - 10.3 mg/dL    GFR,  Estimated >60 >60 mL/min     Comment: (NOTE)  Calculated using the CKD-EPI Creatinine Equation (2021)     Anion gap 14 5 - 15     Comment: Performed at [facility], [address] [place], [place] [address]   Magnesium     Status: None    Collection Time: 10/19/20  9:05 PM   Result Value Ref Range    Magnesium 1.8 1.7 - 2.4 mg/dL     Comment: Performed at [facility], [address], [place], [place] [address]   Phosphorus     Status: None    Collection Time: 10/19/20  9:05 PM   Result Value Ref Range    Phosphorus 4.4 2.5 - 4.6 mg/dL     Comment: Performed at [facility], [address], [place], [place] [address]     Chemistries   Recent Labs   Lab 10/19/20  1628 10/19/20  2105   NA 135 138   K 3.8 3.8   CL 102 104   CO2 22 20*   GLUCOSE 119* 111*   BUN 5* 6   CREATININE 0.67 0.58*   CALCIUM 8.5* 7.6*   MG  --  1.8   AST 127*  --    ALT 179*  --    ALKPHOS 78  --    BILITOT 0.6  --      ------------------------------------------------------------------------------------------------------------------    ------------------------------------------------------------------------------------------------------------------  GFR:  Estimated Creatinine Clearance: 142 mL/min (A) (by  C-G formula based on SCr of 0.58 mg/dL (L)).  Liver Function Tests:  Recent Labs   Lab 10/19/20  1628   AST 127*   ALT 179*   ALKPHOS 78   BILITOT 0.6   PROT 7.3   ALBUMIN 4.1     No results for input(s): LIPASE, AMYLASE in the last 168 hours.  No results for input(s): AMMONIA in the last 168 hours.  Coagulation Profile:  Recent Labs   Lab 10/19/20  1628   INR 1.0     Cardiac Enzymes:  No results for input(s): CKTOTAL, CKMB, CKMBINDEX, TROPONINI in the last 168 hours.  BNP (last 3 results)  No results for input(s): PROBNP in the last 8760 hours.  HbA1C:  No results for input(s): HGBA1C in the last 72 hours.  CBG:  Recent Labs   Lab 10/19/20  1629   GLUCAP 122*     Lipid Profile:  No results for input(s): CHOL, HDL, LDLCALC, TRIG, CHOLHDL, LDLDIRECT in the last 72 hours.  Thyroid Function Tests:  No results for input(s): TSH, T4TOTAL, FREET4, T3FREE, THYROIDAB in the last 72 hours.  Anemia Panel:  No results for input(s): VITAMINB12, FOLATE, FERRITIN, TIBC, IRON, RETICCTPCT in the last 72 hours.    ---------------------------------------------------------------------------------------------------------------  Urine analysis:    Component Value Date/Time    COLORURINE STRAW (A) 10/19/2020 1628    APPEARANCEUR CLEAR 10/19/2020 1628    APPEARANCEUR Clear 07/01/2014 0844    LABSPEC 1.003 (L) 10/19/2020 1628    LABSPEC 1.015 07/01/2014 0844    PHURINE 5.0 10/19/2020 1628    GLUCOSEU NEGATIVE 10/19/2020 1628    GLUCOSEU Negative 07/01/2014 0844    HGBUR SMALL (A) 10/19/2020 1628    BILIRUBINUR NEGATIVE 10/19/2020 1628    BILIRUBINUR Negative 07/01/2014 0844    KETONESUR NEGATIVE 10/19/2020 1628    PROTEINUR NEGATIVE 10/19/2020 1628    NITRITE NEGATIVE 10/19/2020 1628    LEUKOCYTESUR NEGATIVE 10/19/2020 1628    LEUKOCYTESUR Negative 07/01/2014 0844  Imaging Results:      DG Chest Port 1 View    Result Date: 10/19/2020  CLINICAL DATA:  Drug overdose. Possible sepsis. Endotracheal tube placement. EXAM: PORTABLE CHEST 1  VIEW COMPARISON:  07/06/2020 FINDINGS: Endotracheal tube and nasogastric tube are seen in appropriate position. Low lung volumes are again noted. Bibasilar atelectasis is seen, without evidence of pulmonary consolidation or edema. No evidence of pleural effusion. Stable heart size. IMPRESSION: Endotracheal tube and nasogastric tube in appropriate position. Low lung volumes with bibasilar atelectasis. Electronically Signed   By: Danae Orleans M.D.   On: 10/19/2020 17:44     My personal review of EKG: Rhythm sinus tach, Rate 128 /min, QTc 464 ,no Acute ST changes     Assessment & Plan:      Principal Problem:    Overdose  Active Problems:    Intentional overdose of drug in tablet form (HCC)    Essential hypertension    History of alcohol use disorder    Acute respiratory failure (HCC)    Transaminitis    Hypothermia    1. Overdose  1. History seems consistent with intentional  2. When medically cleared will need psych evaluation  3. Intubated for protection of airway  4. Activated charcoal ordered per poison control  5. Serial EKGs to monitor QTC as well as telemetry  6. As needed Ativan for seizure  7. Continue to monitor  2. History of alcohol dependence  1. With an alcohol level 351  2. Change propofol to Precedex  3. As needed Ativan for seizure  4. Continue to monitor  3. Acute respiratory failure  1. Patient was unable to protect airway secondary acute metabolic encephalopathy  2. Continue mechanical ventilation  3. Initial ABG shows adequate oxygenation and ventilation of CO2  4. Monitor ABG in a.m.  5. Repeat chest x-ray in the a.m. continue to monitor placement of ET  6. Continue to monitor  4. Acute metabolic encephalopathy  1. Patient unresponsive on arrival and not protecting airway  2. Secondary to overdose  3. Treatment as above  5. Hypothermia  1. Secondary to overdose  2. Warming blanket applied after warm blankets did not adequately improve temperature  3. Continue to monitor with Foley temp  6.  Hypertension  1. Hydralazine as needed  7. Transaminitis  1. Secondary to overdose with Seroquel/alcohol abuse  2. Trend in the a.m.  3. If no improvement with treatment of overdose, consider quadrant ultrasound    DVT Prophylaxis-Heparin- SCDs     AM Labs Ordered, also please review Full Orders    Family Communication: No family at bedside    Code Status: Full    Admission status: Inpatient :The appropriate admission status for this patient is INPATIENT. Inpatient status is judged to be reasonable and necessary in order to provide the required intensity of service to ensure the patient's safety. The patient's presenting symptoms, physical exam findings, and initial radiographic and laboratory data in the context of their chronic comorbidities is felt to place them at high risk for further clinical deterioration. Furthermore, it is not anticipated that the patient will be medically stable for discharge from the hospital within 2 midnights of admission. The following factors support the admission status of inpatient.        The patient's presenting symptoms include overdose  The worrisome physical exam findings include agonal breathing/respiratory failure   the initial radiographic and laboratory data are worrisome because of transaminitis, elevated alcohol level  The chronic  co-morbidities include psychiatric disorder, history of alcohol dependence        * I certify that at the point of admission it is my clinical judgment that the patient will require inpatient hospital care spanning beyond 2 midnights from the point of admission due to high intensity of service, high risk for further deterioration and high frequency of surveillance required.*    Time spent in minutes : 68    Asia B Zierle-Ghosh DO          Electronically signed by Victorino Dike B, DO at 10/19/2020 10:33 PM EDT

## (undated) NOTE — ED Notes (Signed)
Formatting of this note might be different from the original.  I assumed care of patient at this time.  Electronically signed by Lily Peer, RN at 10/19/2020  7:30 PM EDT

## (undated) NOTE — Progress Notes (Signed)
Formatting of this note might be different from the original.  Pts spouse came to get pts money and keys. Spouse was checked  by security   Electronically signed by Demetrio Lapping, LPN at 16/03/9603  6:54 PM EDT

## (undated) NOTE — Unmapped External Note (Signed)
Formatting of this note might be different from the original.    Problem: Safety:  Goal: Non-violent Restraint(s)  Outcome: Progressing    Problem: Clinical Measurements:  Goal: Will remain free from infection  Outcome: Progressing  Goal: Diagnostic test results will improve  Outcome: Progressing  Goal: Respiratory complications will improve  Outcome: Progressing  Goal: Cardiovascular complication will be avoided  Outcome: Progressing    Problem: Activity:  Goal: Risk for activity intolerance will decrease  Outcome: Progressing    Problem: Elimination:  Goal: Will not experience complications related to urinary retention  Outcome: Progressing    Problem: Skin Integrity:  Goal: Risk for impaired skin integrity will decrease  Outcome: Progressing    Electronically signed by Wylie Hail, RN at 07/07/2020  6:42 PM EST

## (undated) NOTE — Unmapped External Note (Signed)
Formatting of this note might be different from the original.  Binnie Rail, [facility], states Pt has been accepted to the service of Dr. Jola Babinski, room 401-1. [name] contacted hospital staff and notified of acceptance.    [name], [facility], [facility]  Triage Specialist  [phone]    Electronically signed by Germain Osgood, [facility] at 10/22/2020 10:56 PM EDT

## (undated) NOTE — Unmapped External Note (Signed)
Formatting of this note might be different from the original.  PATIENT BED AVAILABLE AFTER 8:30AM on 02/17/20    Patient is to be admitted to [facility]  Attending Physician will be Dr. Toni Amend.      Intake Paper Work has been signed and placed on patient chart.   ER staff is aware of the admission:   Melody ER Secretary     Dr. Larinda Buttery, ER MD    Dewayne Hatch Patient's Nurse    Rosey Bath Patient Access.    Electronically signed by Andee Poles A, LCAS-A at 02/17/2020  2:00 AM EDT

## (undated) NOTE — Discharge Summary (Signed)
Formatting of this note is different from the original.  Physician Discharge Summary   Jeffrey Reese ZOX:[id number] DOB: 01-03-1981 DOA: 10/19/2020    PCP: Pcp, No    Admit date: 10/19/2020  Discharge date: 10/22/2020    Admitted From: home  Disposition:  Inpatient psychiatry    Recommendations for Outpatient Follow-up:   1. Patient to be discharged to [facility] for further management    Discharge Condition:stable  CODE STATUS:full code  Diet recommendation: regular diet    Brief/Interim Summary:  31 y/o male with history of Bipolar, alcohol abuse, admitted after intentional overdose. He was unresponsive on arrival and intubated in ED since it was felt that he was unable to protect airway. Had transient hypotension after admission which was likely related to meds. Blood pressures have since stabilized. He was extubated on 4/27 and respiratory status has been stable. He was seen by psychiatry with recommendations for inpatient psychiatry. He is medically cleared for discharge once a bed is available. Currently he is willing to go to [facility] voluntarily    Discharge Diagnoses:   Principal Problem:    Overdose  Active Problems:    Intentional overdose of drug in tablet form (HCC)    Essential hypertension    Severe bipolar I disorder, current or most recent episode depressed (HCC)    History of alcohol use disorder    Acute respiratory failure (HCC)    Transaminitis    Hypothermia    Bipolar disorder (HCC)    Intentional overdose  -patient had taken 2 grams of seroquel, kratom and alcohol  -received activated charcoal per poison control  -he did not develop any significant arrhythmias  -seen by psychiatry with recommendations for inpatient psychiatry  -currently he is willing to go voluntarily    Acute toxic encephalopathy  -related to overdose of medications/alcohol  -mental status now back to baseline    Acute respiratory failure  -patient was intubated in ED due to concerns that he was not able to  protect his airway  -he was able to extubate on 4/27  -respiratory status has been stable      History of alcohol dependence  -per review of records, he does have a history of DTs  -continue to monitor on CIWA protocol with ativan  -currently appears to be calm, is not tremulous, seems to be well managed with ativan    Hypertension  -chronically on lisinopril and amlodipine which have been resumed  -BP currently stable      Bipolar disorder  -chronically on seroquel, depakote, cariprazine  -holding all meds since he was altered on admission  -will defer to psych regarding resuming meds    Elevated LFTs  -suspect this is related to alcohol  -trending down    Discharge Instructions    Discharge Instructions     Diet - low sodium heart healthy   Complete by: As directed     Increase activity slowly   Complete by: As directed        Allergies as of 10/22/2020       Reactions    Bee Venom Anaphylaxis       Medication List     STOP taking these medications    sulfamethoxazole-trimethoprim 800-160 MG tablet  Commonly known as: BACTRIM DS      TAKE these medications    acetaminophen 325 MG tablet  Commonly known as: TYLENOL  Take 2 tablets (650 mg total) by mouth every 6 (six) hours as needed for  mild pain or moderate pain.    amLODipine 10 MG tablet  Commonly known as: NORVASC  Take 1 tablet (10 mg total) by mouth daily.    busPIRone 10 MG tablet  Commonly known as: BUSPAR  Take 10 mg by mouth 3 (three) times daily.    cariprazine capsule  Commonly known as: VRAYLAR  Take 1 capsule (1.5 mg total) by mouth daily.    divalproex 250 MG DR tablet  Commonly known as: DEPAKOTE  Take 1 tablet (250 mg total) by mouth every 12 (twelve) hours.    EPINEPHrine 0.3 mg/0.3 mL Soaj injection  Commonly known as: EPI-PEN  Inject 0.3 mg into the muscle as needed for anaphylaxis.    lisinopril 10 MG tablet  Commonly known as: ZESTRIL  Take 1 tablet (10 mg total) by mouth daily.    naproxen 500 MG tablet  Commonly known as:  Naprosyn  Take 1 tablet (500 mg total) by mouth 2 (two) times daily with a meal.    QUEtiapine 200 MG tablet  Commonly known as: SEROQUEL  Take 200 mg by mouth at bedtime.    thiamine 100 MG tablet  Take 100 mg by mouth daily. For alcohol withdrawal    triamcinolone cream 0.1 %  Commonly known as: KENALOG  Apply topically 2 (two) times daily.        Allergies   Allergen Reactions   ? Bee Venom Anaphylaxis     Consultations:   TTS/Psychiatry    Procedures/Studies:  DG CHEST PORT 1 VIEW    Result Date: 10/20/2020  CLINICAL DATA:  Respiratory failure EXAM: PORTABLE CHEST 1 VIEW COMPARISON:  10/19/2020. FINDINGS: Endotracheal tube 5.2 cm above the carina. Nasogastric tube extends into the upper abdomen. Pulmonary insufflation is improved. The lungs are clear. No pneumothorax or pleural effusion. Cardiac size within normal limits. Pulmonary vascularity is normal. IMPRESSION: Stable support tubes. Improved pulmonary insufflation. No focal pulmonary infiltrate. Are Electronically Signed   By: Helyn Numbers MD   On: 10/20/2020 04:34     DG Chest Port 1 View    Result Date: 10/19/2020  CLINICAL DATA:  Drug overdose. Possible sepsis. Endotracheal tube placement. EXAM: PORTABLE CHEST 1 VIEW COMPARISON:  07/06/2020 FINDINGS: Endotracheal tube and nasogastric tube are seen in appropriate position. Low lung volumes are again noted. Bibasilar atelectasis is seen, without evidence of pulmonary consolidation or edema. No evidence of pleural effusion. Stable heart size. IMPRESSION: Endotracheal tube and nasogastric tube in appropriate position. Low lung volumes with bibasilar atelectasis. Electronically Signed   By: Danae Orleans M.D.   On: 10/19/2020 17:44        Subjective:  He is agitated about not being able to discharge home and having to go to inpatient psychiatry    Discharge Exam:  Vitals:    10/21/20 2148 10/22/20 0206 10/22/20 0846 10/22/20 1200   BP: 122/81 (!) 153/106 (!) 138/98 (!) 146/99   Pulse: 97 95 91 (!) 108    Resp: 17 15     Temp: 99.1 F (37.3 C) 98.7 F (37.1 C)     TempSrc: Oral Oral     SpO2: 95% 98%     Weight:       Height:         General: Pt is alert, awake, not in acute distress  Cardiovascular: RRR, S1/S2 +, no rubs, no gallops  Respiratory: CTA bilaterally, no wheezing, no rhonchi  Abdominal: Soft, NT, ND, bowel sounds +  Extremities: no edema, no cyanosis  The results of significant diagnostics from this hospitalization (including imaging, microbiology, ancillary and laboratory) are listed below for reference.      Microbiology:  Recent Results (from the past 240 hour(s))   Urine culture     Status: None    Collection Time: 10/19/20  4:28 PM    Specimen: In/Out Cath Urine   Result Value Ref Range Status    Specimen Description   Final     IN/OUT CATH URINE  Performed at [facility], [address] [place], [place] [address]     Special Requests   Final     NONE  Performed at [facility], [address] [place], [place] [address]     Culture   Final     NO GROWTH  Performed at [facility], [address], [place], [place] [address]     Report Status 10/21/2020 FINAL  Final   Resp Panel by RT-PCR (Flu A&B, Covid) Nasopharyngeal Swab     Status: None    Collection Time: 10/19/20  4:36 PM    Specimen: Nasopharyngeal Swab; Nasopharyngeal(NP) swabs in vial transport medium   Result Value Ref Range Status    SARS Coronavirus 2 by RT PCR NEGATIVE NEGATIVE Final     Comment: (NOTE)  SARS-CoV-2 target nucleic acids are NOT DETECTED.    The SARS-CoV-2 RNA is generally detectable in upper respiratory  specimens during the acute phase of infection. The lowest  concentration of SARS-CoV-2 viral copies this assay can detect is  138 copies/mL. A negative result does not preclude SARS-Cov-2  infection and should not be used as the sole basis for treatment or  other patient management decisions. A negative result may occur with   improper specimen collection/handling, submission of specimen other  than  nasopharyngeal swab, presence of viral mutation(s) within the  areas targeted by this assay, and inadequate number of viral  copies(<138 copies/mL). A negative result must be combined with  clinical observations, patient history, and epidemiological  information. The expected result is Negative.    Fact Sheet for Patients:   BloggerCourse.com    Fact Sheet for Healthcare Providers:   SeriousBroker.it    This test is no t yet approved or cleared by the [place] FDA and   has been authorized for detection and/or diagnosis of SARS-CoV-2 by  FDA under an Emergency Use Authorization (EUA). This EUA will remain   in effect (meaning this test can be used) for the duration of the  COVID-19 declaration under Section 564(b)(1) of the Act, 21  U.S.C.section 360bbb-3(b)(1), unless the authorization is terminated   or revoked sooner.        Influenza A by PCR NEGATIVE NEGATIVE Final    Influenza B by PCR NEGATIVE NEGATIVE Final     Comment: (NOTE)  The Xpert Xpress SARS-CoV-2/FLU/RSV plus assay is intended as an aid  in the diagnosis of influenza from Nasopharyngeal swab specimens and  should not be used as a sole basis for treatment. Nasal washings and  aspirates are unacceptable for Xpert Xpress SARS-CoV-2/FLU/RSV  testing.    Fact Sheet for Patients:  BloggerCourse.com    Fact Sheet for Healthcare Providers:  SeriousBroker.it    This test is not yet approved or cleared by the [place] FDA and  has been authorized for detection and/or diagnosis of SARS-CoV-2 by  FDA under an Emergency Use Authorization (EUA). This EUA will remain  in effect (meaning this test can be used) for the duration  of the  COVID-19 declaration under Section 564(b)(1) of the Act, 21 U.S.C.  section 360bbb-3(b)(1), unless the authorization is terminated or  revoked.    Performed at [facility], [address] [place], [place] [address]    Blood  culture (routine single)     Status: None (Preliminary result)    Collection Time: 10/19/20  5:44 PM    Specimen: Site Not Specified; Blood   Result Value Ref Range Status    Specimen Description SITE NOT SPECIFIED  Final    Special Requests   Final     BOTTLES DRAWN AEROBIC AND ANAEROBIC Blood Culture adequate volume    Culture   Final     NO GROWTH 3 DAYS  Performed at [facility], [address], [place], [place] [address]     Report Status PENDING  Incomplete   MRSA PCR Screening     Status: Abnormal    Collection Time: 10/20/20  9:53 AM    Specimen: Nasopharyngeal   Result Value Ref Range Status    MRSA by PCR POSITIVE (A) NEGATIVE Final     Comment:         The GeneXpert MRSA Assay (FDA  approved for NASAL specimens  only), is one component of a  comprehensive MRSA colonization  surveillance program. It is not  intended to diagnose MRSA  infection nor to guide or  monitor treatment for  MRSA infections.  RESULT CALLED TO, READ BACK BY AND VERIFIED WITH:  LOOMIS K. AT 1612 ON [id number] BY  S.  Performed at [facility], [address], [place], [place] [address]        Labs:  BNP (last 3 results)  No results for input(s): BNP in the last 8760 hours.  Basic Metabolic Panel:  Recent Labs   Lab 10/19/20  1628 10/19/20  2105 10/20/20  0531 10/21/20  0551   NA 135 138 139 135   K 3.8 3.8 3.9 3.7   CL 102 104 107 103   CO2 22 20* 22 26   GLUCOSE 119* 111* 86 106*   BUN 5* 6 5* 8   CREATININE 0.67 0.58* 0.64 0.73   CALCIUM 8.5* 7.6* 8.1* 8.2*   MG  --  1.8 1.9  --    PHOS  --  4.4  --   --      Liver Function Tests:  Recent Labs   Lab 10/19/20  1628 10/20/20  0531 10/21/20  0551   AST 127* 68* 29   ALT 179* 121* 78*   ALKPHOS 78 56 68   BILITOT 0.6 0.5 1.1   PROT 7.3 5.5* 5.8*   ALBUMIN 4.1 3.0* 3.0*     No results for input(s): LIPASE, AMYLASE in the last 168 hours.  No results for input(s): AMMONIA in the last 168 hours.  CBC:  Recent Labs   Lab 10/19/20  1628 10/20/20  0531 10/21/20  0551   WBC 8.3 7.4 12.2*    NEUTROABS 4.1 5.2  --    HGB 16.8 13.8 14.3   HCT 50.3 42.8 43.8   MCV 96.5 97.7 97.6   PLT 200 166 154     Cardiac Enzymes:  No results for input(s): CKTOTAL, CKMB, CKMBINDEX, TROPONINI in the last 168 hours.  BNP:  Invalid input(s): POCBNP  CBG:  Recent Labs   Lab 10/19/20  1629 10/20/20  0720   GLUCAP 122* 94     D-Dimer  No results for input(s): DDIMER in  the last 72 hours.  Hgb A1c  No results for input(s): HGBA1C in the last 72 hours.  Lipid Profile  Recent Labs     10/20/20  0531   TRIG 178*     Thyroid function studies  No results for input(s): TSH, T4TOTAL, T3FREE, THYROIDAB in the last 72 hours.    Invalid input(s): FREET3  Anemia work up  No results for input(s): VITAMINB12, FOLATE, FERRITIN, TIBC, IRON, RETICCTPCT in the last 72 hours.  Urinalysis    Component Value Date/Time    COLORURINE STRAW (A) 10/19/2020 1628    APPEARANCEUR CLEAR 10/19/2020 1628    APPEARANCEUR Clear 07/01/2014 0844    LABSPEC 1.003 (L) 10/19/2020 1628    LABSPEC 1.015 07/01/2014 0844    PHURINE 5.0 10/19/2020 1628    GLUCOSEU NEGATIVE 10/19/2020 1628    GLUCOSEU Negative 07/01/2014 0844    HGBUR SMALL (A) 10/19/2020 1628    BILIRUBINUR NEGATIVE 10/19/2020 1628    BILIRUBINUR Negative 07/01/2014 0844    KETONESUR NEGATIVE 10/19/2020 1628    PROTEINUR NEGATIVE 10/19/2020 1628    NITRITE NEGATIVE 10/19/2020 1628    LEUKOCYTESUR NEGATIVE 10/19/2020 1628    LEUKOCYTESUR Negative 07/01/2014 0844     Sepsis Labs  Invalid input(s): PROCALCITONIN,  WBC,  LACTICIDVEN  Microbiology  Recent Results (from the past 240 hour(s))   Urine culture     Status: None    Collection Time: 10/19/20  4:28 PM    Specimen: In/Out Cath Urine   Result Value Ref Range Status    Specimen Description   Final     IN/OUT CATH URINE  Performed at [facility], [address], [place], [place] [address]     Special Requests   Final     NONE  Performed at [facility], [address], [place], [place] [address]     Culture   Final     NO GROWTH  Performed at [facility], [address] [place], [place] [address]     Report Status 10/21/2020 FINAL  Final   Resp Panel by RT-PCR (Flu A&B, Covid) Nasopharyngeal Swab     Status: None    Collection Time: 10/19/20  4:36 PM    Specimen: Nasopharyngeal Swab; Nasopharyngeal(NP) swabs in vial transport medium   Result Value Ref Range Status    SARS Coronavirus 2 by RT PCR NEGATIVE NEGATIVE Final     Comment: (NOTE)  SARS-CoV-2 target nucleic acids are NOT DETECTED.    The SARS-CoV-2 RNA is generally detectable in upper respiratory  specimens during the acute phase of infection. The lowest  concentration of SARS-CoV-2 viral copies this assay can detect is  138 copies/mL. A negative result does not preclude SARS-Cov-2  infection and should not be used as the sole basis for treatment or  other patient management decisions. A negative result may occur with   improper specimen collection/handling, submission of specimen other  than nasopharyngeal swab, presence of viral mutation(s) within the  areas targeted by this assay, and inadequate number of viral  copies(<138 copies/mL). A negative result must be combined with  clinical observations, patient history, and epidemiological  information. The expected result is Negative.    Fact Sheet for Patients:   BloggerCourse.com    Fact Sheet for Healthcare Providers:   SeriousBroker.it    This test is no t yet approved or cleared by the [place] FDA and   has been authorized for detection and/or diagnosis of SARS-CoV-2 by  FDA  under an Emergency Use Authorization (EUA). This EUA will remain   in effect (meaning this test can be used) for the duration of the  COVID-19 declaration under Section 564(b)(1) of the Act, 21  U.S.C.section 360bbb-3(b)(1), unless the authorization is terminated   or revoked sooner.        Influenza A by PCR NEGATIVE NEGATIVE Final    Influenza B by PCR NEGATIVE NEGATIVE Final     Comment: (NOTE)  The Xpert Xpress  SARS-CoV-2/FLU/RSV plus assay is intended as an aid  in the diagnosis of influenza from Nasopharyngeal swab specimens and  should not be used as a sole basis for treatment. Nasal washings and  aspirates are unacceptable for Xpert Xpress SARS-CoV-2/FLU/RSV  testing.    Fact Sheet for Patients:  BloggerCourse.com    Fact Sheet for Healthcare Providers:  SeriousBroker.it    This test is not yet approved or cleared by the [place] FDA and  has been authorized for detection and/or diagnosis of SARS-CoV-2 by  FDA under an Emergency Use Authorization (EUA). This EUA will remain  in effect (meaning this test can be used) for the duration of the  COVID-19 declaration under Section 564(b)(1) of the Act, 21 U.S.C.  section 360bbb-3(b)(1), unless the authorization is terminated or  revoked.    Performed at [facility], [address] [place], [place] [address]    Blood culture (routine single)     Status: None (Preliminary result)    Collection Time: 10/19/20  5:44 PM    Specimen: Site Not Specified; Blood   Result Value Ref Range Status    Specimen Description SITE NOT SPECIFIED  Final    Special Requests   Final     BOTTLES DRAWN AEROBIC AND ANAEROBIC Blood Culture adequate volume    Culture   Final     NO GROWTH 3 DAYS  Performed at [facility], [address], [place], [place] [address]     Report Status PENDING  Incomplete   MRSA PCR Screening     Status: Abnormal    Collection Time: 10/20/20  9:53 AM    Specimen: Nasopharyngeal   Result Value Ref Range Status    MRSA by PCR POSITIVE (A) NEGATIVE Final     Comment:         The GeneXpert MRSA Assay (FDA  approved for NASAL specimens  only), is one component of a  comprehensive MRSA colonization  surveillance program. It is not  intended to diagnose MRSA  infection nor to guide or  monitor treatment for  MRSA infections.  RESULT CALLED TO, READ BACK BY AND VERIFIED WITH:  LOOMIS K. AT 1612 ON [id number] BY [place]  S.  Performed at [facility], [address] [place], [place] [address]      Time coordinating discharge:    SIGNED:    Erick Blinks, MD   [facility]  10/22/2020, 2:01 PM    If 7PM-7AM, please contact night-coverage  [email or web address]    Electronically signed by Erick Blinks, MD at 10/22/2020  2:07 PM EDT

## (undated) NOTE — ED Provider Notes (Signed)
Formatting of this note is different from the original.  Images from the original note were not included.      [facility]  Emergency Department Provider Note    ____________________________________________    I have reviewed the triage vital signs and the nursing notes.    HISTORY    Chief Complaint  Suicidal    HPI  Jeffrey Reese is a 68 y.o. male with a history of alcohol abuse who presents with complaints of depression and feelings of SI.  Patient admits to drinking between 8 and 24 beers a day, he says that he is a chronic functioning alcoholic.  He reports that he has had thoughts of harming himself.  Does not have a distinct plan.  Reports that he would like to go to the [place] where he apparently does get treatment.  Has no physical complaints at this time    Past Medical History:   Diagnosis Date   ? 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 Hypertension      Patient Active Problem List    Diagnosis Date Noted   ? Delirium tremens (HCC) 12/16/2018     Past Surgical History:   Procedure Laterality Date   ? TONSILLECTOMY       Prior to Admission medications    Medication Sig Start Date End Date Taking? Authorizing Provider   amLODipine (NORVASC) 5 MG tablet Take 1 tablet (5 mg total) by mouth daily. 08/14/17 12/16/18  Willy Eddy, MD   chlordiazePOXIDE (LIBRIUM) 25 MG capsule Take 1 tablet (25mg ) by mouth 4 times a day for 2 days, then take 1 tablet by mouth 3 times a day for 2 days, then take 1 tablet by mouth twice a day for 2 days, then take 1 tablet by mouth once a day for 2 days 12/17/18   Mayo, Allyn Kenner, MD   escitalopram (LEXAPRO) 20 MG tablet Take 1 tablet by mouth daily. 06/27/18   [provider]   lisinopril (ZESTRIL) 10 MG tablet Take 1 tablet by mouth daily. 04/16/18   [provider]   naproxen (NAPROSYN) 500 MG tablet Take 1 tablet (500 mg total) by mouth 2 (two) times daily with a meal. 12/09/19   Joni Reining, PA-C    sulfamethoxazole-trimethoprim (BACTRIM DS) 800-160 MG tablet Take 1 tablet by mouth 2 (two) times daily. 12/09/19   Joni Reining, PA-C       Allergies  Patient has no known allergies.    History reviewed. No pertinent family history.    Social History  Social History     Tobacco Use   ? Smoking status: Current Every Day Smoker   ? Smokeless tobacco: Never Used   Substance Use Topics   ? Alcohol use: Yes     Alcohol/week: 24.0 standard drinks     Types: 24 Cans of beer per week   ? Drug use: No     Review of Systems    Constitutional: No fever/chills  Eyes: No visual changes.   ENT: No sore throat.  Cardiovascular: Denies chest pain.  Respiratory: Denies shortness of breath.  Gastrointestinal: No abdominal pain.  Genitourinary: Negative for dysuria.  Musculoskeletal: Negative for back pain.  Skin: Negative for rash.  Neurological: Negative for headaches or weakness    ____________________________________________    PHYSICAL EXAM:    VITAL SIGNS:  ED Triage Vitals [02/15/20 1021]   Enc Vitals Group  BP (!) 179/109      Pulse Rate (!) 125      Resp 14      Temp 98.4 F (36.9 C)      Temp src       SpO2 98 %      Weight       Height       Head Circumference       Peak Flow       Pain Score 0      Pain Loc       Pain Edu?       Excl. in GC?      Constitutional: Alert and oriented.     Nose: No congestion/rhinnorhea.  Mouth/Throat: Mucous membranes are moist.      Cardiovascular: Normal rate, regular rhythm. Grossly normal heart sounds.  Good peripheral circulation.  Respiratory: Normal respiratory effort.  No retractions. Lungs CTAB.  Gastrointestinal: Soft and nontender. No distention.     Musculoskeletal: No lower extremity tenderness nor edema.  Warm and well perfused  Neurologic:  Normal speech and language. No gross focal neurologic deficits are appreciated.   Skin:  Skin is warm, dry and intact. No rash noted.  Psychiatric: Mood and affect are normal. Speech and behavior are  normal.    ____________________________________________    LABS  (all labs ordered are listed, but only abnormal results are displayed)    Labs Reviewed   COMPREHENSIVE METABOLIC PANEL - Abnormal; Notable for the following components:       Result Value    Chloride 97 (*)     Glucose, Bld 116 (*)     Total Protein 8.9 (*)     AST 57 (*)     ALT 79 (*)     Total Bilirubin 1.3 (*)     Anion gap 17 (*)     All other components within normal limits   ETHANOL - Abnormal; Notable for the following components:    Alcohol, Ethyl (B) 317 (*)     All other components within normal limits   SALICYLATE LEVEL - Abnormal; Notable for the following components:    Salicylate Lvl <7.0 (*)     All other components within normal limits   ACETAMINOPHEN LEVEL - Abnormal; Notable for the following components:    Acetaminophen (Tylenol), Serum <10 (*)     All other components within normal limits   CBC - Abnormal; Notable for the following components:    WBC 12.3 (*)     Hemoglobin 18.6 (*)     HCT 52.8 (*)     All other components within normal limits   URINE DRUG SCREEN, QUALITATIVE ([facility] ONLY)     ____________________________________________    EKG  None    ____________________________________________    RADIOLOGY    None  ____________________________________________    PROCEDURES    Procedure(s) performed: No    Procedures    Critical Care performed: No  ____________________________________________    INITIAL IMPRESSION / ASSESSMENT AND PLAN / ED COURSE    Pertinent labs & imaging results that were available during my care of the patient were reviewed by me and considered in my medical decision making (see chart for details).    Patient well-appearing and in no acute distress.  Mild tachycardia upon arrival improved significantly upon my seeing him    We will initiate CIWA protocol given history of alcoholism.    Have consulted TTS and psychiatry.    Patient consult for safety,  he is here voluntarily.    Lab work is overall  reassuring, alcohol level noted to be 300+    Mild elevation in AST and ALT likely related to alcohol use    The patient has been placed in psychiatric observation due to the need to provide a safe environment for the patient while obtaining psychiatric consultation and evaluation, as well as ongoing medical and medication management to treat the patient's condition.  The patient has not been placed under full IVC at this time.      ____________________________________________    FINAL CLINICAL IMPRESSION(S) / ED DIAGNOSES    Final diagnoses:   Suicidal ideation   Alcohol abuse     Note:  This document was prepared using Dragon voice recognition software and may include unintentional dictation errors.    Jene Every, MD  02/15/20 [phone]    Electronically signed by Jene Every, MD at 02/15/2020  2:03 PM EDT

## (undated) NOTE — Progress Notes (Signed)
Formatting of this note is different from the original.  Occupational Therapy Treatment  Patient Details  Name: Jeffrey Reese  MRN: [id number]  DOB: [birth year]  Today's Date: 07/14/2020    History of present illness Pt is a 49 y.o. male with a PMH of suicidal ideation, HTN, and alcohol dependence who presents to the hospital 2/2 intentional overdose of Seroquel 6000 mg. EtOH present on blood work. Per chart, pt is homeless and had told family and friends goodbye prior to this event. Pt with acute toxic metabolic encephalpathy in setting of OD. EEG negative for seizures. CT negative for acute intracranial abnormalities.    OT comments   Pt making gradual progress towards OT goals this session. Pt perseverating on current situation and not wanting to go to [facility] behavioral health, attempted to redirect pt back to session however pt self limiting. Pt continues to report pain in R shoulder, pt reports he thinks he hurt his R shoulder from sleeping in his car for a full day and resting his head in his R hand putting to much pressure on his shoulder. Issued pt written HEP for scapular/ shoulder therex for pain mgmt and to increase AROM. Pt would continue to benefit from skilled occupational therapy while admitted and after d/c to address the below listed limitations in order to improve overall functional mobility and facilitate independence with BADL participation. DC plan remains appropriate, will follow acutely per POC.       Follow Up Recommendations   Outpatient OT;Supervision - Intermittent     Equipment Recommendations   None recommended by OT     Recommendations for Other Services        Precautions / Restrictions Precautions  Precautions: None  Precaution Comments: 1:1 sitter, suicide risk, flight risk  Restrictions  Weight Bearing Restrictions: No         Mobility Bed Mobility  Overal bed mobility: Independent  Bed Mobility: Supine to Sit;Sit to Supine      Supine to sit: Independent  Sit to supine:  Independent    General bed mobility comments: pt transition from supine<>sitting with no physical assist from flat [facility]    Transfers                  General transfer comment: pt declined OOOB mobility      Balance Overall balance assessment: Needs assistance  Sitting-balance support: Feet supported;No upper extremity supported  Sitting balance-Leahy Scale: Good                                      ADL either performed or assessed with clinical judgement     ADL Overall ADL's : Needs assistance/impaired                  Upper Body Dressing : Independent  Upper Body Dressing Details (indicate cue type and reason): pt declined dressing in front of therapist but this OTA returned from printing HEP with pt dressed and ready  Lower Body Dressing: Independent  Lower Body Dressing Details (indicate cue type and reason): pt declined dressing in front of therapist but this OTA returned from printing HEP with pt dressed and ready    Toilet Transfer Details (indicate cue type and reason): pt declined OOB transfer          Functional mobility during ADLs:  (per chart review pt independent with mobility)  General ADL Comments:  pt presents with increased R shoulder pain and self limiting behaviors      Vision        Perception      Praxis        Cognition Arousal/Alertness: Awake/alert  Behavior During Therapy: WFL for tasks assessed/performed  Overall Cognitive Status: No family/caregiver present to determine baseline cognitive functioning                                  General Comments: overall WFL but perseverating on level of care and not wanting to go to [facility], difficult to redirect back to session. self limiting behaviors noted        Exercises Other Exercises  Other Exercises: scapular protraction<>retraction x10 reps 3 sets  Other Exercises: scapular elevation<> depression x10 reps 3 sets    Shoulder Instructions        General Comments isssued pt written HEP to increase carryover     Pertinent Vitals/ Pain       Pain  Assessment: Faces  Faces Pain Scale: Hurts little more  Pain Location: R shoulder with horizontal sh ABD  Pain Descriptors / Indicators: Grimacing;Guarding;Sore  Pain Intervention(s): Limited activity within patient's tolerance;Monitored during session    Home Living                                            Prior Functioning/Environment               Frequency   Min 2X/week       Progress Toward Goals    OT Goals(current goals can now be found in the care plan section)   Progress towards OT goals: Progressing toward goals    Acute Rehab OT Goals  Patient Stated Goal: to improve his R shoulder  OT Goal Formulation: With patient  Time For Goal Achievement: 07/23/20  Potential to Achieve Goals: Good   Plan Discharge plan remains appropriate;Frequency remains appropriate      Co-evaluation                  AM-PAC OT "6 Clicks" Daily Activity      Outcome Measure     Help from another person eating meals?: None  Help from another person taking care of personal grooming?: None  Help from another person toileting, which includes using toliet, bedpan, or urinal?: None  Help from another person bathing (including washing, rinsing, drying)?: None  Help from another person to put on and taking off regular upper body clothing?: None  Help from another person to put on and taking off regular lower body clothing?: None  6 Click Score: 24      End of Session      OT Visit Diagnosis: Muscle weakness (generalized) (M62.81);Pain  Pain - Right/Left: Right  Pain - part of body: Shoulder    Activity Tolerance Patient tolerated treatment well    Patient Left in bed;with call bell/phone within reach;with nursing/sitter in room    Nurse Communication Mobility status          Time: 1610-9604  OT Time Calculation (min): 14 min    Charges: OT General Charges  $OT Visit: 1 Visit  OT Treatments  $Therapeutic Exercise: 8-22 mins    Lenor Derrick., COTA/L  Acute Rehabilitation Services  [phone]  [phone]  Barron Schmid  07/14/2020,  9:45 AM      Electronically signed by Barron Schmid, OTA at 07/14/2020  9:48 AM EST

## (undated) NOTE — Progress Notes (Signed)
Formatting of this note is different from the original.  Neurology Progress Note    S://  Patient seen and examined today.    Pt Obtunded, Doesn't answer questions or follow commands.     O://  Current vital signs:  BP (!) 144/113   Pulse (!) 139   Temp 99.2 F (37.3 C) (Axillary)   Resp 17   Ht 5\' 10"  (1.778 m)   Wt 95.3 kg   SpO2 96%   BMI 30.13 kg/m   Vital signs in last 24 hours:  Temp:  [96.7 F (35.9 C)-99.2 F (37.3 C)] 99.2 F (37.3 C) (01/12 0332)  Pulse Rate:  [98-139] 139 (01/12 0700)  Resp:  [10-27] 17 (01/12 0700)  BP: (102-164)/(80-126) 144/113 (01/12 0700)  SpO2:  [90 %-100 %] 96 % (01/12 0700)  Weight:  [95.3 kg] 95.3 kg (01/11 1259)  GENERAL: Obtunded with labored breathing but arousable to noxious stimuli.  Opens eyes in between and snoring sometimes.   HEENT: - Normocephalic and atraumatic, dry mucous membranes.  LUNGS -patient snoring and making upper airway sounds.  CV -tachycardia but no JVD or peripheral edema  ABDOMEN - Soft, nontender, nondistended   Ext: warm, well perfused,no peripheral edema  NEURO:   Mental Status: He appears obtunded but opens eyes on noxious stimulus.  Language: Patient speaking something in sleep, speech was slurred and unintelligable.  He does not follow any commands.  Cranial Nerves: PERRL, EOM difficult to assess but appears midline when he opens eyes. no facial asymmetry  Motor: Moves all 4 extremities spontaneously as well as to noxious stimulus with full strength.   Reflexes-brisk in LE's more than yesterday and normal in biceps, brachioradialis.  Excessive movements of all 4 extremities present.   Tone: Mildly increased, bulk normal, no clonus  Sensation-appears intact, moves extremities to noxious stimulus.  Toes-downgoing  Coordination: Not able to assess due to poor mentation.  Gait- deferred    Medications    Current Facility-Administered Medications:   ?  0.9 %  sodium chloride infusion, , Intravenous, Continuous, Jonah Blue, MD, Last  Rate: 125 mL/hr at 07/07/20 0628, New Bag at 07/07/20 0628  ?  acetaminophen (TYLENOL) tablet 650 mg, 650 mg, Oral, Q6H PRN **OR** acetaminophen (TYLENOL) suppository 650 mg, 650 mg, Rectal, Q6H PRN, Jonah Blue, MD  ?  albuterol (PROVENTIL) (2.5 MG/3ML) 0.083% nebulizer solution 2.5 mg, 2.5 mg, Nebulization, Q2H PRN, Jonah Blue, MD  ?  Ampicillin-Sulbactam (UNASYN) 3 g in sodium chloride 0.9 % 100 mL IVPB, 3 g, Intravenous, Q6H, Joaquim Lai, RPH, Stopped at 07/07/20 1610  ?  bisacodyl (DULCOLAX) EC tablet 5 mg, 5 mg, Oral, Daily PRN, Jonah Blue, MD  ?  enoxaparin (LOVENOX) injection 40 mg, 40 mg, Subcutaneous, Q24H, Jonah Blue, MD, 40 mg at 07/06/20 1938  ?  folic acid injection 1 mg, 1 mg, Intravenous, Daily, Jonah Blue, MD, 1 mg at 07/06/20 9604  ?  hydrALAZINE (APRESOLINE) injection 5 mg, 5 mg, Intravenous, Q4H PRN, Jonah Blue, MD  ?  LORazepam (ATIVAN) injection 0-4 mg, 0-4 mg, Intravenous, Q4H, 2 mg at 07/07/20 0506 **FOLLOWED BY** [START ON 07/08/2020] LORazepam (ATIVAN) injection 0-4 mg, 0-4 mg, Intravenous, Q8H, Jonah Blue, MD  ?  LORazepam (ATIVAN) tablet 1-4 mg, 1-4 mg, Oral, Q1H PRN **OR** LORazepam (ATIVAN) injection 1-4 mg, 1-4 mg, Intravenous, Q1H PRN, Jonah Blue, MD, 2 mg at 07/07/20 5409  ?  morphine 2 MG/ML injection 2 mg, 2 mg, Intravenous, Q2H PRN, Jonah Blue, MD, 2  mg at 07/07/20 0348  ?  multivitamin with minerals tablet 1 tablet, 1 tablet, Oral, Daily, Jonah Blue, MD  ?  nicotine (NICODERM CQ - dosed in mg/24 hours) patch 14 mg, 14 mg, Transdermal, q1800, Jonah Blue, MD  ?  ondansetron [facility]) tablet 4 mg, 4 mg, Oral, Q6H PRN **OR** ondansetron (ZOFRAN) injection 4 mg, 4 mg, Intravenous, Q6H PRN, Jonah Blue, MD  ?  polyethylene glycol (MIRALAX / GLYCOLAX) packet 17 g, 17 g, Oral, Daily PRN, Jonah Blue, MD  ?  sodium chloride flush (NS) 0.9 % injection 3 mL, 3 mL, Intravenous, Q12H, Jonah Blue, MD, 3 mL at 07/06/20 2124  ?   [DISCONTINUED] thiamine tablet 100 mg, 100 mg, Oral, Daily **OR** thiamine (B-1) injection 500 mg, 500 mg, Intravenous, Daily, Jonah Blue, MD    Current Outpatient Medications:   ?  amLODipine (NORVASC) 10 MG tablet, Take 10 mg by mouth daily., Disp: , Rfl:   ?  busPIRone (BUSPAR) 10 MG tablet, Take 10 mg by mouth 2 (two) times daily., Disp: , Rfl:   ?  divalproex (DEPAKOTE ER) 500 MG 24 hr tablet, Take 500 mg by mouth at bedtime., Disp: , Rfl:   ?  EPINEPHrine 0.3 mg/0.3 mL IJ SOAJ injection, Inject 0.3 mg into the muscle as needed for anaphylaxis., Disp: , Rfl:   ?  escitalopram (LEXAPRO) 20 MG tablet, Take 1 tablet by mouth daily. , Disp: , Rfl:   ?  lisinopril (ZESTRIL) 10 MG tablet, Take 1 tablet by mouth daily., Disp: , Rfl:   ?  naproxen (NAPROSYN) 500 MG tablet, Take 1 tablet (500 mg total) by mouth 2 (two) times daily with a meal., Disp: 20 tablet, Rfl: 00  ?  QUEtiapine (SEROQUEL) 200 MG tablet, Take 200 mg by mouth at bedtime., Disp: , Rfl:   Labs  CBC    Component Value Date/Time    WBC 9.9 07/07/2020 0500    RBC 4.28 07/07/2020 0500    HGB 13.5 07/07/2020 0500    HGB 17.4 07/01/2014 0842    HCT 40.4 07/07/2020 0500    HCT 52.4 (H) 07/01/2014 0842    PLT 169 07/07/2020 0500    PLT 244 07/01/2014 0842    MCV 94.4 07/07/2020 0500    MCV 96 07/01/2014 0842    MCH 31.5 07/07/2020 0500    MCHC 33.4 07/07/2020 0500    RDW 13.2 07/07/2020 0500    RDW 13.7 07/01/2014 0842    LYMPHSABS 1.8 07/06/2020 1307    LYMPHSABS 1.9 07/01/2014 0842    MONOABS 0.9 07/06/2020 1307    MONOABS 0.9 07/01/2014 0842    EOSABS 0.1 07/06/2020 1307    EOSABS 0.2 07/01/2014 0842    BASOSABS 0.0 07/06/2020 1307    BASOSABS 0.0 07/01/2014 0842     CMP     Component Value Date/Time    NA 138 07/07/2020 0500    NA 138 07/01/2014 0842    K 4.0 07/07/2020 0500    K 4.2 07/01/2014 0842    CL 106 07/07/2020 0500    CL 107 07/01/2014 0842    CO2 22 07/07/2020 0500    CO2 25 07/01/2014 0842    GLUCOSE 93 07/07/2020 0500    GLUCOSE 114 (H)  07/01/2014 0842    BUN <5 (L) 07/07/2020 0500    BUN 12 07/01/2014 0842    CREATININE 0.75 07/07/2020 0500    CREATININE 1.07 07/01/2014 0842    CALCIUM 8.4 (L) 07/07/2020 0500  CALCIUM 9.0 07/01/2014 0842    PROT 6.9 07/06/2020 1307    PROT 7.5 07/01/2014 0842    ALBUMIN 3.8 07/06/2020 1307    ALBUMIN 4.2 07/01/2014 0842    AST 70 (H) 07/06/2020 1307    AST 27 07/01/2014 0842    ALT 70 (H) 07/06/2020 1307    ALT 84 (H) 07/01/2014 0842    ALKPHOS 67 07/06/2020 1307    ALKPHOS 92 07/01/2014 0842    BILITOT 1.2 07/06/2020 1307    BILITOT 1.3 (H) 07/01/2014 0842    GFRNONAA >60 07/07/2020 0500    GFRNONAA >60 07/01/2014 0842    GFRNONAA >60 05/03/2012 2215    GFRAA >60 03/09/2020 0236    GFRAA >60 07/01/2014 0842    GFRAA >60 05/03/2012 2215     Lipid Panel   No results found for: CHOL, TRIG, HDL, CHOLHDL, VLDL, LDLCALC, LDLDIRECT    Imaging  I have reviewed images in epic and the results pertinent to this consultation are:    CT Head Wo Contrast    Result Date: 07/06/2020  CLINICAL DATA:  18 year old male found unresponsive, cerebral hemorrhage suspected. EXAM: CT HEAD WITHOUT CONTRAST TECHNIQUE: Contiguous axial images were obtained from the base of the skull through the vertex without intravenous contrast. COMPARISON:  05/03/2012 FINDINGS: Brain: No evidence of acute infarction, hemorrhage, hydrocephalus, extra-axial collection or mass lesion/mass effect. Vascular: No hyperdense vessel or unexpected calcification. Skull: Normal. Negative for fracture or focal lesion. Sinuses/Orbits: No acute finding. Other: None. IMPRESSION: No acute intracranial abnormality. Electronically Signed   By: Marliss Coots MD   On: 07/06/2020 14:30     DG Chest Port 1 View    Result Date: 07/06/2020  CLINICAL DATA:  Cough. EXAM: PORTABLE CHEST 1 VIEW COMPARISON:  08/14/2017. FINDINGS: Mediastinum hilar structures normal. Low lung volumes with bibasilar atelectasis/infiltrates. No pleural effusion or pneumothorax. Stable cardiomegaly. No  pulmonary venous congestion. Thoracic spine scoliosis. IMPRESSION: Low lung volumes with bibasilar atelectasis/infiltrates. Electronically Signed   By: Maisie Fus  Register   On: 07/06/2020 13:25     EEG adult    Result Date: 07/07/2020  Charlsie Quest, MD     07/07/2020  8:34 AM Patient Name: Jeffrey Reese MRN: [id number] Epilepsy Attending: Charlsie Quest Referring Physician/Provider: Dr. Jonah Blue Date: 07/06/2020 Duration: 23.59 mins Patient history: 34 year old male with concern for seizures in the setting of intentional overdose with Seroquel. EEG to evaluate for seizures. Level of alertness: Awake, drowsy, sleep, comatose, lethargic AEDs during EEG study: Lorazepam Technical aspects: This EEG study was done with scalp electrodes positioned according to the 10-20 International system of electrode placement. Electrical activity was acquired at a sampling rate of 500Hz  and reviewed with a high frequency filter of 70Hz  and a low frequency filter of 1Hz . EEG data were recorded continuously and digitally stored. Description: The posterior dominant rhythm consists of 9 Hz activity of moderate voltage (25-35 uV) seen predominantly in posterior head regions, symmetric and reactive to eye opening and eye closing. At around 1800, patient was noted to have an episode where patient's left arm was extended and abducted while he was laying in the bed and suddenly had a jerking movement with elevation of left arm as well as brief generalized whole body twitching. He then continued to have nonrhythmic whole-body movements with eyes closed. Concomitant EEG before, during and after the event did not show any EEG to suggest seizure. Hyperventilation and photic stimulation were not performed.   IMPRESSION: This study is  within normal limits. No seizures or epileptiform discharges were seen throughout the recording. At around 1800, patient was noted to have an episode of jerking as described above without concomitant EEG  change and was not epileptic. Charlsie Quest     Assessment: Pt is a 45 year old male with PMH significant for hypertension, depression, anxiety with history of suicidal ideation, alcohol dependence, tobacco dependence presented with excessive movements of extremities with concern for seizures.  Pt overdosed on unknown amount of Seroquel +/-BuSpar. He has been Afebrile. Blood pressure has been running high with systolic of 102-164 and diastolic of 80 to 126 mmHg.  Patient is tachycardic with PR of 98-139/minute.  He is satting well at room air.  EEG normal with no seizure or epileptiform discharges seen on recording.  Patient was noted to have an episode of jerking without concomitant EEG change and was not epileptic.  CK high at 804, WBCs 9.9, urine tox screen negative.patient symptoms are most likely due to akathisia, EPS such as stiffness.  Do not suspect it as NMS. Patient was started on Benadryl to help with EPS.  Patient was also started on Unasyn for possible aspiration.      Impression:  -Seroquel +/- BuSpar overdose  -Extrapyramidal and Anticholinergic symptoms  -?Akathisia  -?Alcohol withdrawal    Recommendations:  - Supportive care per primary team  - Follow poison control recs.  - Can increase Benadryl to 25 mg Q6H for treatment of extraparametal symptoms  --Avoid Antiposychotics  - Avoid QTC prolonging drugs.  -MRI brain without contrast at some point when stable    Dr. Elige Ko MD  PGY1 [facility] Neurology     Attending Neurohospitalist Addendum  Patient seen and examined with APP/Resident.  Agree with the history and physical as documented above.  Agree with the plan as documented, which I helped formulate.  I have independently reviewed the chart, obtained history, review of systems and examined the patient.I have personally reviewed pertinent head/neck/spine imaging (CT/MRI).  Please feel free to call with any questions.  ---  Milon Dikes, MD  [facility]  Pager:  [phone]    If 7pm to 7am, please call on call as listed on AMION.      Electronically signed by Milon Dikes, MD at 07/07/2020  4:23 PM EST

## (undated) NOTE — Unmapped External Note (Signed)
Formatting of this note is different from the original.  Occupational Therapy Evaluation  Patient Details  Name: Jeffrey Reese  MRN: [id number]  DOB: [birth year]  Today's Date: 07/09/2020    History of Present Illness Pt is a 33 y.o. male with a PMH of suicidal ideation, HTN, and alcohol dependence who presents to the hospital 2/2 intentional overdose of Seroquel 6000 mg. EtOH present on blood work. Per chart, pt is homeless and had told family and friends goodbye prior to this event. Pt with acute toxic metabolic encephalpathy in setting of OD. EEG negative for seizures. CT negative for acute intracranial abnormalities.     Clinical Impression    PTA patient was independent with ADLs/IADLs and was working full-time as a Pharmacist, community. Patient currently presents with deficits in balance and cognition demonstrating mild STM deficits, decreased safety awareness, and decreased problem solving. Patient also demonstrates deficits in RUE AROM with limited shoulder flexion noting difficulties at baseline that have since exacerbated. Patient currently requiring Min guard to Min A grossly for ADLs without AD/AE. Patient with no recollection of injury to R shoulder. Patient would benefit from continued acute OT services in prep for safe d/c. Current recommendation for OPOT pending safe d/c plan. PTA patient reports homelessness staying between friends homes and motels. OT to continue to follow acutely.     Follow Up Recommendations   Outpatient OT;Supervision - Intermittent     Equipment Recommendations   None recommended by OT     Recommendations for Other Services        Precautions / Restrictions Precautions  Precautions: Fall  Precaution Comments: 1:1 sitter, suicide risk, flight risk  Restrictions  Weight Bearing Restrictions: No         Mobility Bed Mobility  Overal bed mobility: Needs Assistance  Bed Mobility: Supine to Sit      Supine to sit: Supervision      General bed mobility comments: Supervision A for  safety.      Transfers  Overall transfer level: Needs assistance  Equipment used: None  Transfers: Sit to/from Stand  Sit to Stand: Min guard          General transfer comment: Min guard for safety. Patient with impulsivity standing before therapist instructed patient to do so.      Balance Overall balance assessment: Needs assistance  Sitting-balance support: No upper extremity supported;Feet supported  Sitting balance-Leahy Scale: Good  Sitting balance - Comments: Able to doff/don footwear seated EOB in figure-4 position with noted difficulty 2/2 decreased AROM in R shoulder. No overt LOB.    Standing balance support: No upper extremity supported;During functional activity  Standing balance-Leahy Scale: Fair  Standing balance comment: No external assist or AD required to maintain standing balance this date.                            ADL either performed or assessed with clinical judgement     ADL Overall ADL's : Needs assistance/impaired  Eating/Feeding: Set up;Sitting    Grooming: Set up;Sitting            Upper Body Dressing : Minimal assistance;Sitting    Lower Body Dressing: Minimal assistance;Sit to/from stand    Toilet Transfer: Surveyor, minerals Details (indicate cue type and reason): Simulated with tranfser to recliner. No AD.          Functional mobility during ADLs: Min guard  General ADL Comments: Min guard for  short distance functional mobility in hospital room with Min guard and no AD.     Vision Baseline Vision/History: No visual deficits  Patient Visual Report: No change from baseline  Vision Assessment?: No apparent visual deficits     Perception      Praxis        Pertinent Vitals/Pain Pain Assessment: 0-10  Pain Score: 6   Pain Location: R shoulder  Pain Descriptors / Indicators: Sore  Pain Intervention(s): Limited activity within patient's tolerance;Monitored during session;Repositioned     Hand Dominance      Extremity/Trunk Assessment Upper Extremity Assessment  Upper Extremity  Assessment: RUE deficits/detail  RUE Deficits / Details: PROM WFL. Very limited active shoulder flexion POA but patient reports worsening. Able to utilize RUE in donning footwear. AROM in elbow (limited 2/2 placement of IV), wrist, and digits WFL.  RUE Sensation: WNL  RUE Coordination: decreased gross motor    Lower Extremity Assessment  Lower Extremity Assessment: Defer to PT evaluation    Cervical / Trunk Assessment  Cervical / Trunk Assessment: Normal    Communication Communication  Communication: No difficulties    Cognition Arousal/Alertness: Awake/alert  Behavior During Therapy: Impulsive  Overall Cognitive Status: Impaired/Different from baseline  Area of Impairment: Safety/judgement;Awareness;Problem solving                          Safety/Judgement: Decreased awareness of safety    Problem Solving: Slow processing  General Comments: A&Ox4. Patient impulsive requiring cues for safety.    General Comments  Significant redness/bruising to RUE>LUE.      Exercises      Shoulder Instructions       Home Living Family/patient expects to be discharged to:: Shelter/Homeless                                  Additional Comments: Per mother via phone call, pt was recently evicted from trailer home. He has been staying with friends and in motel rooms since the eviction. 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 Mother reports unknown plan at d/c for where he will live and if anyone is available to assist him. Does not own any AD/AE.        Prior Functioning/Environment Level of Independence: Independent         Comments: Working full-time as a Mudlogger Problem List: Pain      OT Treatment/Interventions: Environmental manager;Therapeutic exercise;DME and/or AE instruction;Therapeutic activities;Patient/family education;Balance training     OT Goals(Current goals can be found in the care plan section) Acute Rehab OT Goals  Patient Stated Goal: To return to work.  OT Goal Formulation: With  patient  Time For Goal Achievement: 07/23/20  Potential to Achieve Goals: Good  ADL Goals  Additional ADL Goal #1: Patient will complete A.M. ADLs with Mod I and AE PRN demonstrating good safety awareness.  Additional ADL Goal #2: Patient will complete RUE HEP x2 daily to improve AROM and strength in prep for return to work.  Additional ADL Goal #3: Patient will recall 3 stress management technqiues in prep for safe d/c.   OT Frequency: Min 2X/week    Barriers to D/C:            Co-evaluation                AM-PAC OT "6  Clicks" Daily Activity      Outcome Measure Help from another person eating meals?: A Little  Help from another person taking care of personal grooming?: A Little  Help from another person toileting, which includes using toliet, bedpan, or urinal?: A Little  Help from another person bathing (including washing, rinsing, drying)?: A Little  Help from another person to put on and taking off regular upper body clothing?: A Little  Help from another person to put on and taking off regular lower body clothing?: A Little  6 Click Score: 18    End of Session Equipment Utilized During Treatment: Gait belt    Activity Tolerance: Patient tolerated treatment well  Patient left: in chair;with call bell/phone within reach;with chair alarm set;with nursing/sitter in room    OT Visit Diagnosis: Muscle weakness (generalized) (M62.81);Pain  Pain - Right/Left: Right  Pain - part of body: Shoulder     Time: 5409-8119  OT Time Calculation (min): 22 min  Charges:  OT General Charges  $OT Visit: 1 Visit  OT Evaluation  $OT Eval Low Complexity: 1 Low    Destanae H. OTR/L  Supplemental OT, Department of rehab services [phone]    Destanae R H.  07/09/2020, 10:09 AM  Electronically signed by Tama Headings, OT at 07/09/2020 10:14 AM EST

## (undated) NOTE — Progress Notes (Signed)
Formatting of this note is different from the original.  Images from the original note were not included.    PROGRESS NOTE    Jeffrey Reese  ZOX:[id number] DOB: [birth year] DOA: 07/06/2020  PCP: Patient, No Pcp Per     Brief Narrative:   Jeffrey Reese was admitted to the hospital with a working diagnosis of intentional overdose with quetiapine, complicated with extrapyramidal and anticholinergic syndrome.    56 year old male with past medical history for hypertension, depression, anxiety, alcohol abuse and tobacco dependence who presents after an intentional overdose.  He attempted suicide via overdose on 01/09, he called his mother to say goodbye.  The following day he was delusional and disoriented.  01/11 he was found down at the hotel room around 7 in the morning, apparently overdosed with unknown amount of quetiapine +/-buspiron he he received naloxone by EMS with no significant change.  He was brought to the hospital for further evaluation.  On his initial physical examination blood pressure 123/112, heart rate 123, respirate 16, oxygen saturation 99%, lungs clear to auscultation bilaterally, heart S1-S2, present, tachycardic, abdomen soft, no lower extremity edema.    Chest radiograph with bibasilar atelectasis, no frank infiltrates.     EKG 120 bpm, normal axis, normal intervals, sinus rhythm, no st segment or T wave changes.     Assessment & Plan:    Principal Problem:    Intentional overdose of drug in tablet form (HCC)  Active Problems:    Major depressive disorder, recurrent severe without psychotic features (HCC)    Alcohol use disorder, severe, dependence (HCC)    Essential hypertension    Tobacco dependence    Class 1 obesity due to excess calories with body mass index (BMI) of 30.0 to 30.9 in adult    1. Suicidal attempt, quetiapine and buspirone overdose, complicated with extrapyramidal syndrome and anticholinergic syndrome.   Patient continue to be agitated, dry mouth, confused and disorientated.  Continue with supportive medical care.  Neuro checks, fall and aspiration precautions.   IV fluids with dextrose, continue with multivitamins including thiamine (high dose).   EEG no active seizures.     Will consult psychiatry for evaluation and will place patient on one to one observation.   He needs involuntary commitment for now due to suicidal risk.     2. HTN. Continue blood pressure control.     3. Tobacco and alcohol dependence. Continue neuro checks q 4 h, as needed lorazepam per CIWA protocol.   Elevation of AST and ALT 70-70, close follow up.     4. Hypomagnesemia. Mg is 1,7, will correct with mag sulfate. K is 4.0 and renal function stable with serum cr at 0,75.     5. Obesity type 1. Calculated BMI is 30,1. High risk for inpatient complications.     Patient continue to be at high risk for worsening encephalopathy     Status is: Inpatient    Remains inpatient appropriate because:IV treatments appropriate due to intensity of illness or inability to take PO    Dispo: The patient is from: Home               Anticipated d/c is to: Home               Anticipated d/c date is: 3 days               Patient currently is not medically stable to d/c.    DVT prophylaxis: Enoxaparin    Code Status:  full   Family Communication:  No family at the bedside      Consultants:    Psych    Neurology     Subjective:  Patient is confused and disorientated, agitated and aggressive, no apparent pain.     Objective:  Vitals:    07/07/20 1315 07/07/20 1330 07/07/20 1345 07/07/20 1400   BP:    (!) 157/104   Pulse: (!) 47 68 (!) 107 (!) 106   Resp: (!) 23 (!) 23 (!) 23 (!) 22   Temp:       TempSrc:       SpO2: (!) 46% (!) 46% 99% 99%   Weight:       Height:         No intake or output data in the 24 hours ending 07/07/20 1500  Filed Weights    07/06/20 1259   Weight: 95.3 kg     Examination:     General: Not in pain or dyspnea, deconditioned   Neurology: Awake, confused and disorientated, agitated. Positive dyskinesia. No  nystagmus   E ENT: positive pallor, no icterus, oral mucosa very dry   Cardiovascular: No JVD. S1-S2 present, rhythmic, no gallops, rubs, or murmurs. No lower extremity edema.  Pulmonary: positive breath sounds bilaterally,  Gastrointestinal. Abdomen soft and non tender  Skin. No rashes  Musculoskeletal: no joint deformities    Data Reviewed: I have personally reviewed following labs and imaging studies    CBC:  Recent Labs   Lab 07/06/20  1307 07/06/20  1438 07/07/20  0500   WBC 9.5  --  9.9   NEUTROABS 6.6  --   --    HGB 14.8 15.0 13.5   HCT 44.3 44.0 40.4   MCV 93.9  --  94.4   PLT 166  --  169     Basic Metabolic Panel:  Recent Labs   Lab 07/06/20  1307 07/06/20  1438 07/07/20  0500 07/07/20  0533   NA 135 135 138  --    K 3.8 3.6 4.0  --    CL 98  --  106  --    CO2 23  --  22  --    GLUCOSE 144*  --  93  --    BUN 6  --  <5*  --    CREATININE 0.72  --  0.75  --    CALCIUM 9.2  --  8.4*  --    MG  --   --   --  1.7     GFR:  Estimated Creatinine Clearance: 143.6 mL/min (by C-G formula based on SCr of 0.75 mg/dL).  Liver Function Tests:  Recent Labs   Lab 07/06/20  1307   AST 70*   ALT 70*   ALKPHOS 67   BILITOT 1.2   PROT 6.9   ALBUMIN 3.8     No results for input(s): LIPASE, AMYLASE in the last 168 hours.  No results for input(s): AMMONIA in the last 168 hours.  Coagulation Profile:  No results for input(s): INR, PROTIME in the last 168 hours.  Cardiac Enzymes:  Recent Labs   Lab 07/07/20  0533   CKTOTAL 804*     BNP (last 3 results)  No results for input(s): PROBNP in the last 8760 hours.  HbA1C:  No results for input(s): HGBA1C in the last 72 hours.  CBG:  Recent Labs   Lab 07/06/20  1304   GLUCAP 138*  Lipid Profile:  No results for input(s): CHOL, HDL, LDLCALC, TRIG, CHOLHDL, LDLDIRECT in the last 72 hours.  Thyroid Function Tests:  Recent Labs     07/07/20  0533   TSH 1.138     Anemia Panel:  No results for input(s): VITAMINB12, FOLATE, FERRITIN, TIBC, IRON, RETICCTPCT in the last 72  hours.    Radiology Studies:  I have reviewed all of the imaging during this hospital visit personally    Scheduled Meds:  ? enoxaparin (LOVENOX) injection  40 mg Subcutaneous Q24H   ? folic acid  1 mg Intravenous Daily   ? LORazepam  0-4 mg Intravenous Q4H    Followed by   ? [START ON 07/08/2020] LORazepam  0-4 mg Intravenous Q8H   ? multivitamin with minerals  1 tablet Oral Daily   ? nicotine  14 mg Transdermal q1800   ? sodium chloride flush  3 mL Intravenous Q12H     Continuous Infusions:  ? sodium chloride 125 mL/hr at 07/07/20 1610   ? ampicillin-sulbactam (UNASYN) IV Stopped (07/07/20 1234)   ? thiamine injection Stopped (07/07/20 1417)      LOS: 1 day     Mauricio Annett Gula, MD      Electronically signed by Coralie Keens, MD at 07/07/2020  4:22 PM EST

## (undated) NOTE — Unmapped External Note (Signed)
Formatting of this note might be different from the original.  TTS completed reassessment. Pt presents alert, calm and oriented x 4. Pt expressed desires to be discharged after receiving eviction notice by phone. Pt appears sober and stable. Pt denies any current SI/HI/AH/VH and contracted for safety.     Per Dr. Smith Robert pt will be discharged with the recommendation to follow up with resources provided  Electronically signed by Opal Sidles, LCSWA at 02/17/2020 12:53 PM EDT

## (undated) NOTE — Unmapped External Note (Signed)
Formatting of this note is different from the original.  Assessment Note    Jeffrey Reese is an 62 y.o. male who presents to [facility] ED voluntarily for treatment. Per triage note, Pt presents via Sheriff with c/o SI. Reports wants to kill himself. Pt denies details of having a plan 'because im not in my kitchen" Pt voluntary.    During TTS assessment pt presents calm and oriented x 3, irritable but cooperative, and mood-congruent with affect. The pt does not appear to be responding to internal or external stimuli. Neither is the pt presenting with any delusional thinking. Pt verified the information provided to triage RN. Pt stated ?I?m trying to get some help to not hurt myself?. Pt reports endorsing SI with the plan to shoot himself with a gun. Pt appeared to be intoxicated (slurred speech, thought blocking, smelled of beer). Pt was unclear about triggers to his current symptoms stating ?a lot of things?. Pt denies a hx of SI or attempts. Pt reports to live alone and to be employed as a Chartered certified accountant in [employer]. Pt admits to having access to weapons but was unclear about his access. Pt reports an INPT hx in [place] for MH but was unable to remember when stating ?mid 2000?s in [place]?. Pt reports a MH hx of Bipolar and depression at 67 years old but denies any current medication compliance or OPT hx. Pt reports to feel more depressed at night than during the day. Pt reports a SA hx with alcohol (12-15 beers daily) and marijuana (every few days). Pt reports drinking alcohol daily since he was 41 years old and identified a sobriety period of 2 days over a year ago. Pt denied any current withdrawal symptoms and identified his last drink to be before his admission. Pt was unclear about his marijuana use. 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     Per Nanine Means, NP recommend psychiatric Inpatient admission when medically cleared.    Diagnosis: MDD    Past Medical History:   Past Medical History:   Diagnosis Date   ? 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 Hypertension      Past Surgical History:   Procedure Laterality Date   ? TONSILLECTOMY       Family History: History reviewed. No pertinent family history.    Social History:  reports that he has been smoking. He has never used smokeless tobacco. He reports current alcohol use of about 24.0 standard drinks of alcohol per week. He reports that he does not use drugs.    Additional Social History:  Alcohol / Drug Use  Pain Medications: see mar  Prescriptions: see mar  Over the Counter: see mar  History of alcohol / drug use?: Yes  Substance #1  Name of Substance 1: alcohol (12-15 beers daily since 42 years old)  Substance #2  Name of Substance 2: Marijuana    CIWA: CIWA-Ar  BP: (!) 168/98  Pulse Rate: (!) 108  Nausea and Vomiting: no nausea and no vomiting  Tactile Disturbances: none  Tremor: no tremor  Auditory Disturbances: not present  Paroxysmal Sweats: no sweat visible  Visual Disturbances: not present  Anxiety: no  anxiety, at ease  Headache, Fullness in Head: none present  Agitation: normal activity  Orientation and Clouding of Sensorium: oriented and can do serial additions  CIWA-Ar Total: 0  COWS:      Allergies: No Known Allergies    Home Medications: (Not in a hospital admission)    OB/GYN Status:  No LMP for male patient.    General Assessment Data  Location of Assessment: [facility] ED  TTS Assessment: In system  Is this a Tele or Face-to-Face Assessment?: Face-to-Face  Is this an Initial Assessment or a Re-assessment for this encounter?: Initial Assessment  Patient Accompanied by:: N/A  Language Other than English: No  Living Arrangements: Other (Comment) (Private Home )  What gender do you identify as?: Male  Date  Telepsych consult ordered in [facility]: 02/15/20  Marital status: Single  Maiden name: n/a  Pregnancy Status: No  Living Arrangements: Alone  Can pt return to current living arrangement?: Yes  Admission Status: Voluntary  Is patient capable of signing voluntary admission?: Yes  Referral Source: Self/Family/Friend  Insurance type: None         Crisis Care Plan  Living Arrangements: Alone  Legal Guardian:  (self)  Name of Psychiatrist: None reported   Name of Therapist: None reported     Education Status  Is patient currently in school?: No  Is the patient employed, unemployed or receiving disability?: Employed    Risk to self with the past 6 months  Suicidal Ideation: Yes-Currently Present  Has patient been a risk to self within the past 6 months prior to admission? : No  Suicidal Intent: No  Has patient had any suicidal intent within the past 6 months prior to admission? : No  Is patient at risk for suicide?: No, but patient needs Medical Clearance  Suicidal Plan?: Yes-Currently Present  Has patient had any suicidal plan within the past 6 months prior to admission? : No  Specify Current Suicidal Plan: Shoot himself   Access to Means: Yes (owns a gun )  Specify Access to Suicidal Means: Per pt current charges pt has access to a gun   What has been your use of drugs/alcohol within the last 12 months?: alcohol & marijuana   Previous Attempts/Gestures: No  How many times?: 0  Other Self Harm Risks: None reported   Triggers for Past Attempts: None known  Intentional Self Injurious Behavior: None  Family Suicide History: Unknown  Recent stressful life event(s): Other (Comment) (Pt reports a lot of stuff )  Persecutory voices/beliefs?: No  Depression: Yes  Depression Symptoms: Feeling angry/irritable  Substance abuse history and/or treatment for substance abuse?: Yes  Suicide prevention information given to non-admitted patients: Yes    Risk to Others within the past 6 months  Homicidal Ideation: No  Does patient have any  lifetime risk of violence toward others beyond the six months prior to admission? : No  Thoughts of Harm to Others: No  Current Homicidal Intent: No  Current Homicidal Plan: No  Access to Homicidal Means: No  Identified Victim: n/a  History of harm to others?: No  Assessment of Violence: None Noted  Violent Behavior Description: n/a  Does patient have access to weapons?: No  Criminal Charges Pending?: Yes  Describe Pending Criminal Charges:  nonconcealed weapon  Does patient have a court date: Yes  Court Date: 03/03/20  Is patient on probation?: Unknown    Psychosis  Hallucinations: None noted  Delusions: None noted    Mental Status Report  Appearance/Hygiene: In scrubs  Eye Contact: Good  Motor Activity: Freedom of movement  Speech: Logical/coherent  Level of Consciousness: Restless, Irritable  Mood: Depressed, Irritable  Affect: Depressed, Irritable  Anxiety Level: None  Thought Processes: Coherent, Relevant  Judgement: Partial  Orientation: Appropriate for developmental age  Obsessive Compulsive Thoughts/Behaviors: None    Cognitive Functioning  Concentration: Fair  Memory: Recent Intact, Remote Intact  Is patient IDD: No  Insight: Poor  Impulse Control: Good  Appetite: Good  Have you had any weight changes? : No Change  Sleep: No Change  Total Hours of Sleep: 8  Vegetative Symptoms: None        Prior Inpatient Therapy  Prior Inpatient Therapy: Yes  Prior Therapy Dates: Pt report Mid 2000's  Prior Therapy Facilty/Provider(s): [facility]   Reason for Treatment: MH    Prior Outpatient Therapy  Prior Outpatient Therapy: No  Does patient have an ACCT team?: No  Does patient have Intensive In-House Services?  : No  Does patient have [facility] services? : Unknown  Does patient have P4CC services?: Unknown    ADL Screening (condition at time of admission)  Is the patient deaf or have difficulty hearing?: No  Does the patient have difficulty seeing, even when wearing glasses/contacts?: No  Does the patient have difficulty  concentrating, remembering, or making decisions?: No  Does the patient have difficulty dressing or bathing?: No  Does the patient have difficulty walking or climbing stairs?: No  Weakness of Legs: None  Weakness of Arms/Hands: None    Home Assistive Devices/Equipment  Home Assistive Devices/Equipment: None    Therapy Consults (therapy consults require a physician order)  PT Evaluation Needed: No  OT Evalulation Needed: No  SLP Evaluation Needed: No  Abuse/Neglect Assessment (Assessment to be complete while patient is alone)  Abuse/Neglect Assessment Can Be Completed: Yes  Physical Abuse: Denies  Verbal Abuse: Denies  Sexual Abuse: Denies  Exploitation of patient/patient's resources: Denies  Self-Neglect: Denies  Values / Beliefs  Cultural Requests During Hospitalization: None  Spiritual Requests During Hospitalization: None  Consults  Spiritual Care Consult Needed: No  Transition of Care Team Consult Needed: No                Disposition:   Disposition  Initial Assessment Completed for this Encounter: Yes  Patient referred to: Other (Comment)    On Site Evaluation by:    Reviewed with Physician:      Opal Sidles  02/15/2020 2:56 PM  Electronically signed by Opal Sidles, LCSWA at 02/15/2020  6:12 PM EDT

## (undated) NOTE — ED Notes (Signed)
Formatting of this note might be different from the original.  Pt brought into ED BHU via sally port and wand with Printmaker for safety by [facility]. Patient oriented to unit/care area: Pt informed of unit policies and procedures.  Informed that, for their safety, care areas are designed for safety and monitored by security cameras at all times. Patient verbalizes understanding, and verbal contract for safety obtained.Pt shown to their room.   Electronically signed by Chelsea Aus, RN at 02/15/2020 10:44 PM EDT

## (undated) NOTE — ED Notes (Signed)
Formatting of this note might be different from the original.  Pt up to the bathroom then to nursing station to request ibuprofen. Dr Manson Passey informed and will place order.  Electronically signed by Chelsea Aus, RN at 02/16/2020  5:05 AM EDT

## (undated) NOTE — Unmapped External Note (Signed)
Formatting of this note is different from the original.  Transition of Care [facility]) - Initial/Assessment Note     Patient Details   Name: Jeffrey Reese  MRN: [id number]  Date of Birth: Sep 12, 1980    [facility]) CM/SW Contact:     Lockie Pares, RN  Phone Number:  07/09/2020, 10:38 AM    Clinical Narrative:                  Day 2 admitted for intentional OD seroquel. Patient currently under IVC, states is homeless has been staying with friends. Has Full time work, drinks alcohol . When clear will be transferred to IP psychiatric services.PT and OT currently recommending home health, however patient does not have a stable place, if needed after IP psych, may look to outpatient therapy services.     Expected Discharge Plan: Psychiatric Hospital  Barriers to Discharge: Continued Medical Work up    Patient Goals and CMS Choice          Expected Discharge Plan and Services  Expected Discharge Plan: Psychiatric Hospital  In-house Referral: Clinical Social Work  Discharge Planning Services: CM Consult    Living arrangements for the past 2 months: No permanent address,Homeless                          Prior Living Arrangements/Services  Living arrangements for the past 2 months: No permanent address,Homeless  Lives with:: Self  Patient language and need for interpreter reviewed:: Yes    Need for Family Participation in Patient Care: Yes (Comment)  Care giver support system in place?: Yes (comment)    Criminal Activity/Legal Involvement Pertinent to Current Situation/Hospitalization: No - Comment as needed    Activities of Daily Living        Permission Sought/Granted                Emotional Assessment        Orientation: : Oriented to Self,Oriented to Place,Oriented to Situation  Alcohol / Substance Use: Alcohol Use  Psych Involvement: No (comment)    Admission diagnosis:  Drug overdose, undetermined intent, initial encounter [T50.904A]  Intentional overdose of drug in tablet form (HCC) [T50.902A]  Patient  Active Problem List    Diagnosis Date Noted   ? Intentional overdose of drug in tablet form (HCC) 07/06/2020   ? Essential hypertension 07/06/2020   ? Tobacco dependence 07/06/2020   ? Class 1 obesity due to excess calories with body mass index (BMI) of 30.0 to 30.9 in adult 07/06/2020   ? 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 Alcohol use disorder, severe, dependence (HCC) 02/15/2020   ? Delirium tremens (HCC) 12/16/2018     PCP:  Patient, No Pcp Per  Pharmacy:    [id number] [place] - [address] AT [facility] OF [address]  [address]  [place] [address]  Phone: [phone] Fax: [phone]    Social Determinants of Health (SDOH) Interventions      Readmission Risk Interventions  No flowsheet data found.    Electronically signed by Lockie Pares, RN at 07/09/2020 10:40 AM EST

## (undated) NOTE — ED Provider Notes (Signed)
Formatting of this note is different from the original.  Images from the original note were not included.    [facility] EMERGENCY DEPARTMENT  Provider Note    CSN: [id number]  Arrival date & time: 07/06/20  1250        History  Chief Complaint   Patient presents with   ? Drug Overdose     Jeffrey Reese is a 41 y.o. male.    31 year old male presents via EMS after reported overdose of unknown amount of Seroquel.  Patient reportedly took these pills 6 hours ago.  Patient may have also taken BuSpar.  EMS was called after patient found unresponsive by his girlfriend.  On their arrival, he was able to protect his airway.  They did place a nasal trumpet for precautionary measures.  CBG was over 100.  Was given 4 mg of Narcan without relief.  Transported here for further management        Past Medical History:   Diagnosis Date   ? 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 Hypertension      Patient Active Problem List    Diagnosis Date Noted   ? 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 Alcohol use disorder, severe, dependence (HCC) 02/15/2020   ? Delirium tremens (HCC) 12/16/2018     Past Surgical History:   Procedure Laterality Date   ? TONSILLECTOMY         No family history on file.    Social History     Tobacco Use   ? Smoking status: Current Every Day Smoker   ? Smokeless tobacco: Never Used   Substance Use Topics   ? Alcohol use: Yes     Alcohol/week: 24.0 standard drinks     Types: 24 Cans of beer per week   ? Drug use: No     Home Medications  Prior to Admission medications    Medication Sig Start Date End Date Taking? Authorizing Provider   amLODipine (NORVASC) 10 MG tablet Take 10 mg by mouth daily.    [provider]   EPINEPHrine 0.3 mg/0.3 mL IJ SOAJ injection Inject 0.3 mg into the muscle as needed for anaphylaxis.    [provider]   escitalopram (LEXAPRO) 20 MG tablet Take 1 tablet by mouth daily.   06/27/18   [provider]   lisinopril (ZESTRIL) 10 MG tablet Take 1 tablet by mouth daily. 04/16/18   [provider]   naproxen (NAPROSYN) 500 MG tablet Take 1 tablet (500 mg total) by mouth 2 (two) times daily with a meal. 12/09/19   Joni Reining, PA-C     Allergies     Bee venom    Review of Systems    Review of Systems   Unable to perform ROS: Psychiatric disorder     Physical Exam  Updated Vital Signs  BP (!) 130/96 (BP Location: Right Arm)   Pulse (!) 126   Resp 14   Ht 1.778 m (5\' 10" )   Wt 95.3 kg   SpO2 100%   BMI 30.13 kg/m     Physical Exam  Vitals and nursing note reviewed.   Constitutional:       General: He is not in acute distress.     Appearance: Normal appearance. He is well-developed and well-nourished. He is not toxic-appearing.   HENT:      Head: Normocephalic  and atraumatic.   Eyes:      General: Lids are normal.      Extraocular Movements: EOM normal.      Conjunctiva/sclera: Conjunctivae normal.      Pupils: Pupils are equal, round, and reactive to light.   Neck:      Thyroid: No thyroid mass.      Trachea: No tracheal deviation.   Cardiovascular:      Rate and Rhythm: Regular rhythm. Bradycardia present.      Heart sounds: Normal heart sounds. No murmur heard.  No gallop.    Pulmonary:      Effort: Pulmonary effort is normal. No respiratory distress.      Breath sounds: Normal breath sounds. No stridor. No decreased breath sounds, wheezing, rhonchi or rales.   Abdominal:      General: Bowel sounds are normal. There is no distension.      Palpations: Abdomen is soft.      Tenderness: There is no abdominal tenderness. There is no CVA tenderness or rebound.   Musculoskeletal:         General: No tenderness or edema. Normal range of motion.      Cervical back: Normal range of motion and neck supple.   Skin:     General: Skin is warm and dry.      Findings: No abrasion or rash.   Neurological:      Mental Status: He is lethargic and disoriented.      GCS: GCS eye  subscore is 2. GCS verbal subscore is 3. GCS motor subscore is 4.      Cranial Nerves: No facial asymmetry.      Motor: No tremor.      Deep Tendon Reflexes: Strength normal.   Psychiatric:         Attention and Perception: He is inattentive.         Mood and Affect: Mood and affect normal.     ED Results / Procedures / Treatments    Labs  (all labs ordered are listed, but only abnormal results are displayed)  Labs Reviewed   RESP PANEL BY RT-PCR (FLU A&B, COVID) [id number]   COMPREHENSIVE METABOLIC PANEL   ETHANOL   ACETAMINOPHEN LEVEL   CBC   RAPID URINE DRUG SCREEN, HOSP PERFORMED   RAPID URINE DRUG SCREEN, HOSP PERFORMED   CBC WITH DIFFERENTIAL/PLATELET   BLOOD GAS, ARTERIAL   CBG MONITORING, ED     EKG  None    Radiology  No results found.    Procedures  Procedures (including critical care time)    Medications Ordered in ED  Medications   0.9 %  sodium chloride infusion (has no administration in time range)     ED Course   I have reviewed the triage vital signs and the nursing notes.    Pertinent labs & imaging results that were available during my care of the patient were reviewed by me and considered in my medical decision making (see chart for details).      MDM Rules/Calculators/A&P      Patient's blood gas noted here and shows no evidence of CO2 retention.  He is able to protect his airway here.  Poison control contacted and their recommendations noted.  Head CT and chest x-ray without acute findings.  Does have elevated alcohol level as well 2.  Rechecked multiple times and continues to be sleeping and only arousable to deep stimulation.  Will admit to the hospitalist service for serial monitoring  CRITICAL CARE  Performed by: Toy Baker  Total critical care time: 50 minutes  Critical care time was exclusive of separately billable procedures and treating other patients.  Critical care was necessary to treat or prevent imminent or life-threatening deterioration.  Critical care was time spent personally  by me on the following activities: development of treatment plan with patient and/or surrogate as well as nursing, discussions with consultants, evaluation of patient's response to treatment, examination of patient, obtaining history from patient or surrogate, ordering and performing treatments and interventions, ordering and review of laboratory studies, ordering and review of radiographic studies, pulse oximetry and re-evaluation of patient's condition.    Final Clinical Impression(s) / ED Diagnoses  Final diagnoses:   None     Rx / DC Orders  ED Discharge Orders     None         Lorre Nick, MD  07/06/20 1444    Electronically signed by Lorre Nick, MD at 07/06/2020  2:44 PM EST

## (undated) NOTE — Progress Notes (Signed)
Formatting of this note might be different from the original.  PT Cancellation Note    Patient Details  Name: Jeffrey Reese  MRN: [id number]  DOB: [birth year]    Cancelled Treatment:    Reason Eval/Treat Not Completed: Patient at procedure or test/unavailable. Pt out of room getting MRI. Will follow-up as able.    Raymond Gurney, PT, DPT  Acute Rehabilitation Services   Pager: [phone]  Office: [phone]    Jewel Baize  07/09/2020, 1:28 PM      Electronically signed by Jewel Baize, PT at 07/09/2020  1:28 PM EST

## (undated) NOTE — Progress Notes (Signed)
Formatting of this note is different from the original.  eLink Physician-Brief Progress Note  Patient Name: Jeffrey Reese  DOB: 1980/08/24  MRN: [id number]    Date of Service   10/19/2020   HPI/Events of Note   Patient just arrived to the ICU. Seen on camera. Current vitals stable on PRVC V 550, peep 5, RR 15, o2 60%. ER note reviewed. Admit note pending and RN mentioned that MD is at bedside and doing the admission and placing orders. Seroquel, Krotam and alcohol overdose. Currently agitated on the vent and is being sedated with propofol. Most recent EKG with Qtc 466, QRS is 97. Do not see a mag level. ABG reviewed on current vent support.    eICU Interventions   Suggest-   Continue sedation while on vent for comfort  Watch for seizures  Check bmp and mag, would replete to keep K around 4 and mag over 2  Serial EKG, ABG in AM   Poison control already contacted  Hydration   If habitual alcoholic, would also add thiamine / folate to regimen and watch for withdrawl  DVT and GI prophylaxis while on vent    Admission is being done by bedside. Asked that RN reach out to E link for any concerns.      Intervention Category  Major Interventions: Respiratory failure - evaluation and management;Other:  Minor Interventions: Agitation / anxiety - evaluation and management  Evaluation Type: New Patient Evaluation    [name]  10/19/2020, 8:38 PM     10:10 pm   Called for hypotension with SBP in 80s, seen on camera  Admit note is pending in system but RN tells me patient was switched to precedex and is at 1 mic already and has dropped his BP since then. I am told BP has been in 80s now for 20 min. In addition, two RNs holding him down in his room. Apparently was doing much better on propofol  Has Ativan ordered but mentioned only as PRN seizures  Ativan x 1 now, LR bolus 1 liter, and have re ordered the propofol since it was tolerated better and was more effective for sedation as well, per RN. At almost max dose precedex, he  is struggling and fighting everyone and would be risk for self extubation so need a better sedation strategy  I asked RN to let me know if hemodynamic issues repeat while on propofol as well  In addition, 20 meq IV kcl and 2 gram IV mag ordered    12:01 am   Repeat hypotension  Now off precedex  Very comfortable on low dose propofol only however drops his BP  BP is fine when not on sedation but obviously then agitated and in distress  Has a good 18 G IV and additional 2 peripheral IV lines that RN has secured  Start peripheral levophed - sedation plus ingested substances likely causing labile Bps, in addition I also wrote for another LR bolus  D/w RN in detail.     Electronically signed by [name], MD at 10/20/2020 12:03 AM EDT

## (undated) NOTE — Progress Notes (Signed)
Formatting of this note might be different from the original.   An officer called the unit around 2144from the police . .departmen in [place] stating  will not be able to transfer pt to [place]  [facility]  As it was too late and no admittance after 7 pm. Pt  Made aware. He will need disposal / scrub to wear  Prior to transfer.  Pt reassured that staff will get him some paperless scrub before transfer and pt demonstrated good understanding.. Made Dr Leafy Half aware. [facility] on call.  Electronically signed by Ashley Royalty, RN at 07/14/2020  3:03 AM EST

## (undated) NOTE — ED Notes (Signed)
Formatting of this note might be different from the original.  Hourly rounding reveals patient laying in bed. Meal tray provided.  Pt reports feeling warm, perspiration noted.  No other complaints, stable, in no acute distress. Q15 minute rounds and monitoring via Psychologist, counselling to continue.  Electronically signed by Ancil Boozer, RN at 02/15/2020  6:33 PM EDT

## (undated) NOTE — ED Notes (Signed)
Formatting of this note might be different from the original.  Pt states he called the [place] this morning expressing thought of suicide. They referred him to nearest ED and had an officer to drive him, pt is voluntary at present. When asked what has been stressing him, pt states "why are you wearing a mask", states "a lot of things'. Pt is very vague and does not want to talk about why he is here.   Electronically signed by Susanne Greenhouse, RN at 02/15/2020 11:12 AM EDT

## (undated) NOTE — Unmapped External Note (Signed)
Formatting of this note might be different from the original.  Physician Final Progress Note    Patient ID:  Jeffrey Reese  MRN: [id number]  DOB/AGE: Nov 23, 1980 26 y.o.    Admit date: 02/15/2020  Admitting provider: No admitting provider for patient encounter.  Discharge date: 02/17/2020    Admission Diagnoses:  ETOH intoxication and withdrawal   Major depression moderate recurrent     Voluntary status     Discharge Diagnoses:   Principal Problem:    Major depressive disorder, recurrent severe without psychotic features (HCC)  Active Problems:    Alcohol use disorder, severe, dependence (HCC)    Post ETOH intoxication, withdrawal and Mjaor depression moderate recurrent     Consults: TTS / MD Psych /MD ER     Significant Findings/ Diagnostic Studies:  BAL ----elevate d    Procedures: MD rounds ciwa protocol     Discharge Condition: fair   Disposition:  home     Patient initially admitted --for detox and he voiced SI and depression    However he is sober and stable now post CIWA protocol     On the day of finding a bed, he know says he needs to go home because he is e    Was supposed to be voluntarily admitted but now changed his mind due to eviction notice today     He says he safe for discharge and contracts for safety no other SI HI or plans he says     Alert, oriented times four  Appearance ---unkept  Messy   Rapport and eye contact okay   Speech normal rate tone volume   Mood and affect still depressed and anxious  No shakes tics tremors  Thought process and content normal  Judgement insight reliability fair  Intelligence and fund of knowledge normal  No active SI HI or plans  Abstraction oK  Concentration and attention fair   Consciousness not fluctuant or clouded  Memory remote recent immediate okay     Assets  Not clear  Cognition improved  ADL;s okay   Handedness not known  Leans not known   Musculoskeletal  Okay   Gait and station okay   Language normla  Sleep improving   Psychomotor activity normal    Akathisia none     Diet:  As tolerated   Discharge Activity:   AA NA related groups  Day treatment men's groups service work   Vocational rehab     Total time spent taking care of this patient:  30-40  minutes    Signed:  Roselind Messier  02/17/2020, 12:21 PM  Electronically signed by Roselind Messier, MD at 02/17/2020 12:39 PM EDT

## (undated) NOTE — ED Notes (Signed)
Formatting of this note might be different from the original.  EEG at bedside  Electronically signed by Forbes Cellar, RN at 07/06/2020  5:28 PM EST

## (undated) NOTE — Unmapped External Note (Signed)
Formatting of this note is different from the original.  Neurology Consultation    Reason for Consult: Concern for seizures  Referring Physician: Dr. Jonah Blue    ZO:[id number] for seizures    History is obtained from:EMR     HPI: Jeffrey Reese is a 71 y.o. male past medical history significant for hypertension, depression/anxiety with history of suicidal ideation, alcohol dependence, tobacco dependence presented with excessive movements of extremities with concern for seizures.  Patient overdosed on unknown amount of Seroquel +/-BuSpar, found facedown at hotel room at approx 0700.  EMS gave Narcan w/no change.  Patient blood pressure has been running high since admission with systolic of 102-152 and diastolic of 80 to 120 mmHg.  Patient is tachycardic with pulse rate ranging from 106-127/minute.  He is afebrile and satting well at room air. CT head negative for acute intracranial pathology.  Ethyl alcohol level is high at 96.  AST and ALT mildly elevated at 53 and 64 respectively.  Acetaminophen level <10.  QTc mildly elevated at 486. Pt has alcohol use disorder and was admitted for alcohol withdrawal recently.    LKW: unknown    ROS:  Unable to obtain due to altered mental status.     Past Medical History:   Diagnosis Date   ? 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 Hypertension      No family history on file.    Social History:    reports that he has been smoking. He has never used smokeless tobacco. He reports current alcohol use of about 24.0 standard drinks of alcohol per week. He reports that he does not use drugs.    Medications    Current Facility-Administered Medications:   ?  0.9 %  sodium chloride infusion, , Intravenous, Continuous, Lorre Nick, MD, Last Rate: 125 mL/hr at 07/06/20 1342, New Bag at 07/06/20 1342    Current Outpatient Medications:   ?  amLODipine (NORVASC) 10 MG tablet, Take 10 mg by mouth daily., Disp: , Rfl:   ?  busPIRone (BUSPAR) 10 MG tablet, Take 10 mg by  mouth 2 (two) times daily., Disp: , Rfl:   ?  divalproex (DEPAKOTE ER) 500 MG 24 hr tablet, Take 500 mg by mouth at bedtime., Disp: , Rfl:   ?  EPINEPHrine 0.3 mg/0.3 mL IJ SOAJ injection, Inject 0.3 mg into the muscle as needed for anaphylaxis., Disp: , Rfl:   ?  escitalopram (LEXAPRO) 20 MG tablet, Take 1 tablet by mouth daily. , Disp: , Rfl:   ?  lisinopril (ZESTRIL) 10 MG tablet, Take 1 tablet by mouth daily., Disp: , Rfl:   ?  naproxen (NAPROSYN) 500 MG tablet, Take 1 tablet (500 mg total) by mouth 2 (two) times daily with a meal., Disp: 20 tablet, Rfl: 00  ?  QUEtiapine (SEROQUEL) 200 MG tablet, Take 200 mg by mouth at bedtime., Disp: , Rfl:     Exam:  Current vital signs:  BP (!) 144/94   Pulse (!) 127   Temp (!) 96.7 F (35.9 C) (Axillary) Comment (Src): patient responded to probe under arm.   Resp 18   Ht 5\' 10"  (1.778 m)   Wt 95.3 kg   SpO2 100%   BMI 30.13 kg/m   Vital signs in last 24 hours:  Temp:  [96.7 F (35.9 C)] 96.7 F (35.9 C) (01/11 1316)  Pulse Rate:  [106-127] 127 (01/11 1615)  Resp:  [10-27] 18 (  01/11 1615)  BP: (102-152)/(80-120) 144/94 (01/11 1615)  SpO2:  [93 %-100 %] 100 % (01/11 1615)  Weight:  [95.3 kg] 95.3 kg (01/11 1259)    GENERAL: Somnolent, breathing heavily and snoring in between. Arousable by sternal rub and painful stimuli.   Moving all 4 limbs spontaneously.  He is not oriented to place, person, time.   HEENT: - Normocephalic and atraumatic, mucous membranes dry, face flushed, no LN++, no Thyromegally  LUNGS -symmetric chest rise,Normal respiratory effort  CV -tachycardia present, no JVD, no peripheral edema  ABDOMEN - Soft,  nondistended   Ext: warm, well perfused,  no edema    NEURO:   Mental Status: Somnolent, breathing heavily and snoring in between. Arousable by sternal rub and painful stimuli.  Moving all 4 limbs.   He is not oriented to place, person, time.   Language: On painful stimuli patient started cursing on staff. Not able to assess fully due to AMS.    Cranial Nerves: PERRL, EOM- Not able to assess   motor: Tone normal, bulk is normal, excessive movements of all 4 extremities and excessive facial movements observed.  Pt is restless.    Reflexes- Increased in LE's.     Sensation- Intact to touch as evidence by grimacing and withdrawal.   Coordination: Not able to assess due to AMS  Gait- deferred    Labs   I have reviewed labs in epic and the results pertinent to this consultation are:    CBC    Component Value Date/Time    WBC 9.5 07/06/2020 1307    RBC 4.72 07/06/2020 1307    HGB 15.0 07/06/2020 1438    HGB 17.4 07/01/2014 0842    HCT 44.0 07/06/2020 1438    HCT 52.4 (H) 07/01/2014 0842    PLT 166 07/06/2020 1307    PLT 244 07/01/2014 0842    MCV 93.9 07/06/2020 1307    MCV 96 07/01/2014 0842    MCH 31.4 07/06/2020 1307    MCHC 33.4 07/06/2020 1307    RDW 13.1 07/06/2020 1307    RDW 13.7 07/01/2014 0842    LYMPHSABS 1.8 07/06/2020 1307    LYMPHSABS 1.9 07/01/2014 0842    MONOABS 0.9 07/06/2020 1307    MONOABS 0.9 07/01/2014 0842    EOSABS 0.1 07/06/2020 1307    EOSABS 0.2 07/01/2014 0842    BASOSABS 0.0 07/06/2020 1307    BASOSABS 0.0 07/01/2014 0842     CMP     Component Value Date/Time    NA 135 07/06/2020 1438    NA 138 07/01/2014 0842    K 3.6 07/06/2020 1438    K 4.2 07/01/2014 0842    CL 98 07/06/2020 1307    CL 107 07/01/2014 0842    CO2 23 07/06/2020 1307    CO2 25 07/01/2014 0842    GLUCOSE 144 (H) 07/06/2020 1307    GLUCOSE 114 (H) 07/01/2014 0842    BUN 6 07/06/2020 1307    BUN 12 07/01/2014 0842    CREATININE 0.72 07/06/2020 1307    CREATININE 1.07 07/01/2014 0842    CALCIUM 9.2 07/06/2020 1307    CALCIUM 9.0 07/01/2014 0842    PROT 6.9 07/06/2020 1307    PROT 7.5 07/01/2014 0842    ALBUMIN 3.8 07/06/2020 1307    ALBUMIN 4.2 07/01/2014 0842    AST 70 (H) 07/06/2020 1307    AST 27 07/01/2014 0842    ALT 70 (H) 07/06/2020 1307    ALT 84 (H)  07/01/2014 0842    ALKPHOS 67 07/06/2020 1307    ALKPHOS 92 07/01/2014 0842    BILITOT 1.2 07/06/2020 1307     BILITOT 1.3 (H) 07/01/2014 0842    GFRNONAA >60 07/06/2020 1307    GFRNONAA >60 07/01/2014 0842    GFRNONAA >60 05/03/2012 2215    GFRAA >60 03/09/2020 0236    GFRAA >60 07/01/2014 0842    GFRAA >60 05/03/2012 2215     Lipid Panel   No results found for: CHOL, TRIG, HDL, CHOLHDL, VLDL, LDLCALC, LDLDIRECT    Imaging  I have reviewed the images obtained:    CT Head Wo Contrast    Result Date: 07/06/2020  CLINICAL DATA:  76 year old male found unresponsive, cerebral hemorrhage suspected. EXAM: CT HEAD WITHOUT CONTRAST TECHNIQUE: Contiguous axial images were obtained from the base of the skull through the vertex without intravenous contrast. COMPARISON:  05/03/2012 FINDINGS: Brain: No evidence of acute infarction, hemorrhage, hydrocephalus, extra-axial collection or mass lesion/mass effect. Vascular: No hyperdense vessel or unexpected calcification. Skull: Normal. Negative for fracture or focal lesion. Sinuses/Orbits: No acute finding. Other: None. IMPRESSION: No acute intracranial abnormality. Electronically Signed   By: Marliss Coots MD   On: 07/06/2020 14:30     DG Chest Port 1 View    Result Date: 07/06/2020  CLINICAL DATA:  Cough. EXAM: PORTABLE CHEST 1 VIEW COMPARISON:  08/14/2017. FINDINGS: Mediastinum hilar structures normal. Low lung volumes with bibasilar atelectasis/infiltrates. No pleural effusion or pneumothorax. Stable cardiomegaly. No pulmonary venous congestion. Thoracic spine scoliosis. IMPRESSION: Low lung volumes with bibasilar atelectasis/infiltrates. Electronically Signed   By: Maisie Fus  Register   On: 07/06/2020 13:25     MRI examination of the brain    Assessment: Seroquel +/- BuSpar overdose  -Extrapyramidal and Anticholinergic symptoms  -Akathisia  -? Seizure  -?Alcohol withdrawal    Impression:Pt is a 89 year old male with PMH significant for hypertension, depression, anxiety with history of suicidal ideation, alcohol dependence, tobacco dependence presented with excessive movements of extremities  with concern for seizures.  Pt overdosed on unknown amount of Seroquel +/-BuSpar s/p Narcan w/no change. BP running high in ED, tachycardic.  He is afebrile and satting well at room air. CT head negative for acute intracranial pathology.  Ethyl alcohol level is high at 96.  QTc mildly elevated at 486. Pt's symptoms most likely related to overdose of Seroquel +/-Buspar leading to EPS / akathisia/anticholinergic side effects. No seizure-like activity seen on examination but he is at high risk for having a seizures due to Seroquel +/-BuSpar overdose so we will keep it as one of the differential. We will order EEG to monitor for seizures. Will not start antiepileptics at this time but will consider it if EEG shows seizures. Less likely Neuroleptic Malignant syndrome as Pt is Afebrile with normal tone and no stiffness. Pt has alcohol use disorder and was admitted for alcohol withdrawal recently, Pt may be withdrawing in addition to overdose. Would recommend monitoring QTc.    Recommendations:  -Supportive care.  -Avoid antipsychotics.  -Continue IV fluids.  -Can give Benadryl to help with EPS  -EEg  -Urine toxicology screen.   -Monitor vitals  -Monitor QTc    Dr. Karsten Ro MD  PGY1 [facility]     Attending addendum    HPI:   Patient seen and examined at the request of Dr. Ophelia Charter for concern for seizures in the setting of intentional overdose with Seroquel plus minus BuSpar.   Agree with the history and physical  documented above.  I have obtained some more history as below.  I got some more history after the initial encounter and rounding with the neurology team.  Dr. Ophelia Charter was able to contact the mother who said that he is attempted suicide in the past.  This time around he reports taking at least 6 g of Seroquel on Sunday.  He also posted on social media that this is his last bolus then he is going to die.  Unclear if he took buspirone or not but there was a question so that has been documented in the  note.  Patient unable to provide any history  Unable to perform review of systems due to patient's mentation  Unable to provide any other history from the patient.       Objective    On examination, blood pressure 150/90, heart rate 127, afebrile.  General examination: Obtunded with sonorous breathing but arousable to sternal rub.  HEENT: Normocephalic atraumatic with dry oral mucous membranes  Lungs: Chest clear to auscultation  Cardiovascular: Tachycardia but no JVD or peripheral edema  Abdomen soft nondistended nontender  Extremities warm well perfused  Neurological exam  He is appearing obtunded but on noxious stimulation wakes up and starts yelling obscenities.  Speech is clear when he is yelling obscenities.  He does not follow any commands   Spontaneously sometimes showing some writhing movements-question akathisia.  Cranial nerves: Pupils equal round reactive to light, extraocular movements difficult to assess but gaze appears midline, does not blink to threat from either side, face appears symmetric.  Motor exam: Moves all 4 extremities spontaneously as well as to noxious stimulation with full strength.  Sensory exam: As above  Coordination cannot be tested due to his mentation.      His tone did appear to be mildly increased in all 4 extremities but no clonus.  His DTRs revealed mildly brisk ankle jerks but normal biceps reflexes and brachioradialis reflexes.  Toes are downgoing    I have personally reviewed CT head-no acute intracranial abnormality    Labs-blood gas unremarkable.  BMP unremarkable with the exception of elevated glucose at 144.  Liver function tests show AST and ALT elevated at 70.  Normal GFR.  CBC within normal limits.        Assessment    49 year old with past medical history significant for hypertension depression anxiety depression suicidal ideation and prior suicide attempt, alcohol dependence, tobacco dependence with intentional Seroquel overdose and questionable BuSpar overdose  as well.  Remains hypotensive and tachycardic  Question if some of the movements noted on trying to examine him were seizures but I think he has more akathisia and some extrapyramidal symptoms such as stiffness.  I do not suspect he has neuroleptic malignant syndrome.    Recommendations    - Supportive care per primary team  - Follow poison control findings  - Can use Benadryl for treatment of extraparametal symptoms  - Avoid QTC prolonging drugs.  - Spot EEG stat.  - If the Spot EEG shows any evidence of seizures, will consider long-term EEG.   -MRI brain without contrast at some point when stable.      Plan discussed with Dr. Ophelia Charter      --   Milon Dikes, MD    Neurologist   [facility]   Pager: [phone]      Electronically signed by Milon Dikes, MD at 07/06/2020  6:07 PM EST

## (undated) NOTE — Procedures (Signed)
Associated Order(s): EEG ADULT  Formatting of this note might be different from the original.  Patient Name: Jeffrey Reese   MRN: [id number]   Epilepsy Attending: Charlsie Quest   Referring Physician/Provider: Dr. Jonah Blue  Date: 07/06/2020   Duration: 23.59 mins    Patient history: 26 year old male with concern for seizures in the setting of intentional overdose with Seroquel. EEG to evaluate for seizures.    Level of alertness: Awake    AEDs during EEG study: Lorazepam    Technical aspects: This EEG study was done with scalp electrodes positioned according to the 10-20 International system of electrode placement. Electrical activity was acquired at a sampling rate of 500Hz  and reviewed with a high frequency filter of 70Hz  and a low frequency filter of 1Hz . EEG data were recorded continuously and digitally stored.     Description: The posterior dominant rhythm consists of 9 Hz activity of moderate voltage (25-35 uV) seen predominantly in posterior head regions, symmetric and reactive to eye opening and eye closing.    At around 1800, patient was noted to have an episode where patient's left arm was extended and abducted while he was laying in the bed and suddenly had a jerking movement with elevation of left arm as well as brief generalized whole body twitching. He then continued to have nonrhythmic whole-body movements with eyes closed. Concomitant EEG before, during and after the event did not show any EEG to suggest seizure.    Hyperventilation and photic stimulation were not performed.       IMPRESSION:  This study is within normal limits. No seizures or epileptiform discharges were seen throughout the recording.    At around 1800, patient was noted to have an episode of jerking as described above without concomitant EEG change and was not epileptic.    Charlsie Quest       Electronically signed by Charlsie Quest, MD at 07/08/2020  8:52 AM EST

## (undated) NOTE — Progress Notes (Signed)
Formatting of this note is different from the original.  Occupational Therapy Treatment  Patient Details  Name: Jeffrey Reese  MRN: [id number]  DOB: 1981-05-14  Today's Date: 07/10/2020    History of present illness Pt is a 3 y.o. male with a PMH of suicidal ideation, HTN, and alcohol dependence who presents to the hospital 2/2 intentional overdose of Seroquel 6000 mg. EtOH present on blood work. Per chart, pt is homeless and had told family and friends goodbye prior to this event. Pt with acute toxic metabolic encephalpathy in setting of OD. EEG negative for seizures. CT negative for acute intracranial abnormalities.    OT comments   Pt. Was ed on HEP for R SHLD flex/ext, ABD/ADD. Pt. Was able to perform srom with s and cues for technique. Pt. Was ed on UE/LE ADLs to increase I and was S with tasks. Pt. To be followed by Acute OT to maximize function prior to dc.    Follow Up Recommendations   Outpatient OT;Supervision - Intermittent     Equipment Recommendations   None recommended by OT     Recommendations for Other Services        Precautions / Restrictions Precautions  Precautions: Fall  Precaution Comments: 1:1 sitter, suicide risk, flight risk  Restrictions  Weight Bearing Restrictions: No         Mobility Bed Mobility        Sidelying to sit: Modified independent (Device/Increase time)  Supine to sit: Modified independent (Device/Increase time)  Sit to supine: Modified independent (Device/Increase time)        Transfers  Overall transfer level: Needs assistance    Transfers: Sit to/from Stand  Sit to Stand: Supervision                Balance      Sitting balance-Leahy Scale: Good        Standing balance-Leahy Scale: Good                              ADL either performed or assessed with clinical judgement     ADL Overall ADL's : Needs assistance/impaired      Grooming: Standing;Supervision/safety;Wash/dry hands;Wash/dry face            Upper Body Dressing : Supervision/safety;Standing (simulated to perform  donning pullover shirt and front opening shirt wtih gown)    Lower Body Dressing: Supervision/safety;Sit to/from stand    Toilet Transfer: Supervision/safety;Comfort height toilet;Ambulation            Functional mobility during ADLs: Supervision/safety  General ADL Comments: S with ADL tasks.      Vision    Vision Assessment?: No apparent visual deficits    Perception      Praxis        Cognition Arousal/Alertness: Awake/alert  Behavior During Therapy: WFL for tasks assessed/performed                                            Exercises      Shoulder Instructions        General Comments       Pertinent Vitals/ Pain       Pain Assessment: No/denies pain    Home Living  Prior Functioning/Environment               Frequency   Min 2X/week       Progress Toward Goals    OT Goals(current goals can now be found in the care plan section)   Progress towards OT goals: Progressing toward goals    Acute Rehab OT Goals  Patient Stated Goal: to improve his R shoulder  OT Goal Formulation: With patient  Time For Goal Achievement: 07/23/20  Potential to Achieve Goals: Good  ADL Goals  Additional ADL Goal #1: Patient will complete A.M. ADLs with Mod I and AE PRN demonstrating good safety awareness.  Additional ADL Goal #2: Patient will complete RUE HEP x2 daily to improve AROM and strength in prep for return to work.  Additional ADL Goal #3: Patient will recall 3 stress management technqiues in prep for safe d/c.   Plan Discharge plan remains appropriate      Co-evaluation                  AM-PAC OT "6 Clicks" Daily Activity      Outcome Measure     Help from another person eating meals?: None  Help from another person taking care of personal grooming?: A Little  Help from another person toileting, which includes using toliet, bedpan, or urinal?: A Little  Help from another person bathing (including washing, rinsing, drying)?: A Little  Help from another person to put on and taking off  regular upper body clothing?: A Little  Help from another person to put on and taking off regular lower body clothing?: A Little  6 Click Score: 19      End of Session      OT Visit Diagnosis: Muscle weakness (generalized) (M62.81);Pain    Activity Tolerance Patient tolerated treatment well    Patient Left in bed;with call bell/phone within reach;with nursing/sitter in room    Nurse Communication  (ok therapy)          Time: 1610-9604  OT Time Calculation (min): 30 min    Charges: OT General Charges  $OT Visit: 1 Visit  OT Treatments  $Self Care/Home Management : 8-22 mins  $Therapeutic Exercise: 8-22 mins    Derrek Gu OT/L    HORVATH,STEPHANIE  07/10/2020, 10:46 AM      Electronically signed by Waldemar Dickens, OT at 07/10/2020 10:48 AM EST

## (undated) NOTE — Progress Notes (Signed)
Formatting of this note might be different from the original.  CSW alerted by MD that patient is medically stable for transfer to [facility]. CSW attempted to reach Disposition; left a message requesting a call back.     CSW to follow.    Blenda Nicely, [place]  Clinical Social Worker  [phone]    Electronically signed by Baldemar Lenis, LCSW at 07/12/2020  1:41 PM EST
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
## (undated) NOTE — Progress Notes (Signed)
Formatting of this note is different from the original.  Images from the original note were not included.    PROGRESS NOTE    Jeffrey Reese  JHE:[id number] DOB: [birth year] DOA: 07/06/2020  PCP: Patient, No Pcp Per     Brief Narrative:   [name] admitted to the hospital with a working diagnosis of intentional overdose with quetiapine/ buspirone,complicated withextrapyramidal andanticholinergicsyndrome.    20 year old male with past medical history for hypertension, depression, anxiety, alcohol abuse and tobacco dependence who presents after an intentional overdose. He attempted suicide via overdose on 01/09,he called his mother to say goodbye. The following day he was delusional and disoriented.  01/11he was found down at the hotel room around 7 in the morning, apparently overdosed with unknown amount of quetiapine +/-buspiron he he received naloxone by EMS with no significant change. He was brought to the hospital for further evaluation.  On his initial physical examination blood pressure 123/112, heart rate 123, respirate 16, oxygen saturation 99%, lungs clear to auscultation bilaterally, heart S1-S2, present, tachycardic, abdomen soft, no lower extremity edema.    Chest radiograph with bibasilar atelectasis, no frank infiltrates.     EKG 120 bpm, normal axis, normal intervals, sinus rhythm, no st segment or T wave changes.    Patient placed under supportive medical care with slow improvement of his symptoms.   Initially required 4 point restrains.     Psychiatry consulted and recommendations for inpatient psych. Patient is involuntary committed due to high suicidal risk.    Patient is now medically stable for transfer.    He has been calm and cooperative.     Assessment & Plan:    Principal Problem:    Intentional overdose of drug in tablet form (HCC)  Active Problems:    Major depressive disorder, recurrent severe without psychotic features (HCC)    Alcohol use disorder, severe, dependence  (HCC)    Essential hypertension    Tobacco dependence    Class 1 obesity due to excess calories with body mass index (BMI) of 30.0 to 30.9 in adult    1. Suicidal attempt, quetiapine and buspirone overdose, complicated with extrapyramidal syndrome and anticholinergic syndrome.EEG with no seizures.  Brain MRI with no acute changes, questionable changes in T2 signal in the medial thalami to hypothalamic region, can indicate Wernicke encephalopathy.    Patient at home ondivalproex, buspirone, escitalopramand quetiapine.    Continue medically stable for discharge to inpatient psych, he is more calm and cooperative. Will allow him for shower per his request.   Continue with suicidal precautions, one to one sitter.  Psychiatry has recommended inpatient psych, patient continue IVC.     2. HTN.On amlodipine and lisinopril for blood pressure control    3. Tobacco and alcohol dependence.  No current clinical signs of active withdrawal, on as needed lorazepam.  On multivitamins and thiamine.    4. Hypomagnesemia/ hypokalemia.resolved, patient is tolerating po well.     5. Obesity type 1/ chronic pain/ right shoulder pain, suspected rotator cuff tear. Calculated BMI is 30,1.  Analgesia with naproxen, acetaminophen and lidocaine patch. Will need outpatient follow up with orthopedics.     Status is: Inpatient    Remains inpatient appropriate because:Unsafe d/c plan    Dispo: The patient is from: Home               Anticipated d/c is to: Inpatient psych               Anticipated d/c date  is: 1 day               Patient currently is medically stable to d/c.    DVT prophylaxis: Enoxaparin    Code Status:   full   Family Communication:  No family at the bedside, her mother has been updated by me during this hospitalization about plans for inpatient psychiatry, all questions were addressed.     Consultants:    Psychiatry     Subjective:  Patient is awake and alert, he is calm and cooperative not agitated. Continue  with one to one sitter.     Objective:  Vitals:    07/10/20 1955 07/10/20 2333 07/11/20 0339 07/11/20 0914   BP: (!) 142/106 (!) 142/102 (!) 137/98 (!) 132/97   Pulse: 86 80 73 88   Resp: 18 20 18 16    Temp: 98.1 F (36.7 C) 98.6 F (37 C) 98.3 F (36.8 C) 98.2 F (36.8 C)   TempSrc: Oral Oral Oral Oral   SpO2: 100% 98% 99% 99%   Weight:       Height:         Intake/Output Summary (Last 24 hours) at 07/11/2020 0935  Last data filed at 07/10/2020 1500  Gross per 24 hour   Intake 720 ml   Output --   Net 720 ml     Filed Weights    07/06/20 1259   Weight: 95.3 kg     Examination:     General: Not in pain or dyspnea.   Neurology: Awake and alert, non focal   E ENT: no pallor, no icterus, oral mucosa moist  Cardiovascular: No JVD. S1-S2 present, rhythmic, no gallops, rubs, or murmurs. No lower extremity edema.  Pulmonary: positive breath sounds bilaterally, adequate air movement, no wheezing, rhonchi or rales.  Gastrointestinal. Abdomen soft  Skin. No rashes  Musculoskeletal: no joint deformities    Data Reviewed: I have personally reviewed following labs and imaging studies    CBC:  Recent Labs   Lab 07/06/20  1307 07/06/20  1438 07/07/20  0500   WBC 9.5  --  9.9   NEUTROABS 6.6  --   --    HGB 14.8 15.0 13.5   HCT 44.3 44.0 40.4   MCV 93.9  --  94.4   PLT 166  --  169     Basic Metabolic Panel:  Recent Labs   Lab 07/06/20  1307 07/06/20  1438 07/07/20  0500 07/07/20  0533 07/08/20  0324 07/09/20  0413   NA 135 135 138  --  137 139   K 3.8 3.6 4.0  --  3.3* 3.9   CL 98  --  106  --  101 105   CO2 23  --  22  --  24 24   GLUCOSE 144*  --  93  --  96 110*   BUN 6  --  <5*  --  <5* 7   CREATININE 0.72  --  0.75  --  0.63 0.82   CALCIUM 9.2  --  8.4*  --  8.9 9.2   MG  --   --   --  1.7  --  2.2     GFR:  Estimated Creatinine Clearance: 140.1 mL/min (by C-G formula based on SCr of 0.82 mg/dL).  Liver Function Tests:  Recent Labs   Lab 07/06/20  1307 07/08/20  0324   AST 70* 65*   ALT 70* 69*   ALKPHOS 67 64  BILITOT 1.2  1.4*   PROT 6.9 6.1*   ALBUMIN 3.8 3.3*     No results for input(s): LIPASE, AMYLASE in the last 168 hours.  No results for input(s): AMMONIA in the last 168 hours.  Coagulation Profile:  No results for input(s): INR, PROTIME in the last 168 hours.  Cardiac Enzymes:  Recent Labs   Lab 07/07/20  0533   CKTOTAL 804*     BNP (last 3 results)  No results for input(s): PROBNP in the last 8760 hours.  HbA1C:  No results for input(s): HGBA1C in the last 72 hours.  CBG:  Recent Labs   Lab 07/06/20  1304   GLUCAP 138*     Lipid Profile:  No results for input(s): CHOL, HDL, LDLCALC, TRIG, CHOLHDL, LDLDIRECT in the last 72 hours.  Thyroid Function Tests:  No results for input(s): TSH, T4TOTAL, FREET4, T3FREE, THYROIDAB in the last 72 hours.  Anemia Panel:  No results for input(s): VITAMINB12, FOLATE, FERRITIN, TIBC, IRON, RETICCTPCT in the last 72 hours.    Radiology Studies:  I have reviewed all of the imaging during this hospital visit personally    Scheduled Meds:  ? amLODipine  10 mg Oral Daily   ? enoxaparin (LOVENOX) injection  40 mg Subcutaneous Q24H   ? folic acid  1 mg Oral Daily   ? lidocaine  1 patch Transdermal Q24H   ? lisinopril  10 mg Oral Daily   ? multivitamin with minerals  1 tablet Oral Daily   ? naproxen  500 mg Oral BID WC   ? nicotine  21 mg Transdermal q1800   ? sodium chloride flush  3 mL Intravenous Q12H   ? thiamine  100 mg Oral Daily     Continuous Infusions:     LOS: 5 days     Jeffrey Annett Gula, MD      Electronically signed by Coralie Keens, MD at 07/11/2020  9:40 AM EST

## (undated) NOTE — Progress Notes (Signed)
Formatting of this note might be different from the original.  Patient arrived to room 313, from ICU.Report given by Shon Baton RN, Vital signs stable. Room searched and all potentially harmful objects removed. Magnet sign placed outside door.1:1 monitor remains with patient..  Electronically signed by Olen Pel, LPN at 16/03/9603  3:26 PM EDT

## (undated) NOTE — Progress Notes (Signed)
Formatting of this note might be different from the original.  AT 1020 hr RT performed weaning parameters on patient, patient performed -40 NIF, FVC 1.4L. Patient also alert and following commands. MD notified and RT received orders to extubate. RT extubated patient at 1028 hr to 2L O2 via nasal cannula, HR 98, RR 25 and SPO2 94%. BBS diminished and clear. RN at bedside, no complications noted. RT will continue to monitor.  Electronically signed by Burna Mortimer, RRT at 10/20/2020 10:36 AM EDT

## (undated) NOTE — Progress Notes (Signed)
Formatting of this note is different from the original.  Images from the original note were not included.    PROGRESS NOTE    Jeffrey Reese  ZOX:[id number] DOB: 11/24/1980 DOA: 10/19/2020  PCP: Pcp, No     Brief Narrative:   57 y/o male with history of Bipolar, alcohol abuse, admitted after intentional overdose. He was unresponsive on arrival and intubated in ED since it was felt that he was unable to protect airway. Had transient hypotension after admission which was likely related to meds. PCCM consulted for vent management. Will need psychiatry input once medically stable.     Assessment & Plan:    Principal Problem:    Overdose  Active Problems:    Intentional overdose of drug in tablet form (HCC)    Essential hypertension    History of alcohol use disorder    Acute respiratory failure (HCC)    Transaminitis    Hypothermia    Bipolar disorder (HCC)    Intentional overdose  -patient had taken 2 grams of seroquel, kratom and alcohol  -received activated charcoal per poison control  -continue to monitor for arrhythmias  -will need TTS evaluation once extubated and stable    Acute toxic encephalopathy  -related to overdose of medications/alcohol  -will need to reassess mental status once he is extubated    Acute respiratory failure  -patient was intubated in ED due to concerns that he was not able to protect his airway  -ABG is acceptable  -chest xray did not show any evidence of pneumonia  -PCCM consult to assist with vent management    History of alcohol dependence  -per review of records, he does have a history of DTs  -currently on propofol for sedation  -continue to monitor and will start on CIWA once extubated  -continue on thiamine and folate    Hypertension  -chronically on lisinopril and amlodipine  -had hypotension overnight, possibly related to meds  -BP currently stable  -continue to hold antihypertensives for now    Bipolar disorder  -chronically on seroquel, depakote, cariprazine  -holding all meds  since he was altered on admission  -will defer to psych regarding resuming meds    Elevated LFTs  -suspect this is related to alcohol  -continue to follow    DVT prophylaxis: heparin injection 5,000 Units Start: 10/19/20 2200  SCDs Start: 10/19/20 2059    Code Status: full code  Family Communication: no family at bedside  Disposition Plan: Status is: Inpatient    Remains inpatient appropriate because:Inpatient level of care appropriate due to severity of illness    Dispo: The patient is from: Home               Anticipated d/c is to: TBD, possible inpatient psych               Patient currently is not medically stable to d/c.    Difficult to place patient No    Consultants:    PCCM    Procedures:    ETT 4/26>    Antimicrobials:         Subjective:  Intubated and sedated. Was agitated overnight and is currently in wrist restraints. He had some hypotension overnight and briefly required levophed, but has since been weaned off.     Objective:  Vitals:    10/20/20 0300 10/20/20 0400 10/20/20 0411 10/20/20 0500   BP: 106/64 115/71  131/86   Pulse: 90 (!) 115  95   Resp: 16 (!)  21  18   Temp: (!) 97.16 F (36.2 C) 98.06 F (36.7 C) 98.2 F (36.8 C) (P) 99.14 F (37.3 C)   TempSrc:       SpO2: 99% 98%  98%   Weight:   103.2 kg    Height:         Intake/Output Summary (Last 24 hours) at 10/20/2020 0717  Last data filed at 10/20/2020 0526  Gross per 24 hour   Intake 4108.26 ml   Output 1775 ml   Net 2333.26 ml     Filed Weights    10/19/20 1630 10/20/20 0411   Weight: 99.8 kg 103.2 kg     Examination:    General exam: intubated and sedated  Respiratory system: Clear to auscultation. Respiratory effort normal.  Cardiovascular system: S1 & S2 heard, RRR. No JVD, murmurs, rubs, gallops or clicks. No pedal edema.  Gastrointestinal system: Abdomen is nondistended, soft and nontender. No organomegaly or masses felt. Normal bowel sounds heard.  Central nervous system: unable to assess due to sedation  Extremities: he is in  bilateral wrist restraints  Skin: No rashes, lesions or ulcers  Psychiatry: unable to assess.     Data Reviewed: I have personally reviewed following labs and imaging studies    CBC:  Recent Labs   Lab 10/19/20  1628 10/20/20  0531   WBC 8.3 7.4   NEUTROABS 4.1 5.2   HGB 16.8 13.8   HCT 50.3 42.8   MCV 96.5 97.7   PLT 200 166     Basic Metabolic Panel:  Recent Labs   Lab 10/19/20  1628 10/19/20  2105   NA 135 138   K 3.8 3.8   CL 102 104   CO2 22 20*   GLUCOSE 119* 111*   BUN 5* 6   CREATININE 0.67 0.58*   CALCIUM 8.5* 7.6*   MG  --  1.8   PHOS  --  4.4     GFR:  Estimated Creatinine Clearance: 144.3 mL/min (A) (by C-G formula based on SCr of 0.58 mg/dL (L)).  Liver Function Tests:  Recent Labs   Lab 10/19/20  1628   AST 127*   ALT 179*   ALKPHOS 78   BILITOT 0.6   PROT 7.3   ALBUMIN 4.1     No results for input(s): LIPASE, AMYLASE in the last 168 hours.  No results for input(s): AMMONIA in the last 168 hours.  Coagulation Profile:  Recent Labs   Lab 10/19/20  1628   INR 1.0     Cardiac Enzymes:  No results for input(s): CKTOTAL, CKMB, CKMBINDEX, TROPONINI in the last 168 hours.  BNP (last 3 results)  No results for input(s): PROBNP in the last 8760 hours.  HbA1C:  No results for input(s): HGBA1C in the last 72 hours.  CBG:  Recent Labs   Lab 10/19/20  1629   GLUCAP 122*     Lipid Profile:  No results for input(s): CHOL, HDL, LDLCALC, TRIG, CHOLHDL, LDLDIRECT in the last 72 hours.  Thyroid Function Tests:  No results for input(s): TSH, T4TOTAL, FREET4, T3FREE, THYROIDAB in the last 72 hours.  Anemia Panel:  No results for input(s): VITAMINB12, FOLATE, FERRITIN, TIBC, IRON, RETICCTPCT in the last 72 hours.  Sepsis Labs:  Recent Labs   Lab 10/19/20  1636 10/19/20  1744   LATICACIDVEN 2.0* 2.1*     Recent Results (from the past 240 hour(s))   Resp Panel by RT-PCR (Flu A&B, Covid)  Nasopharyngeal Swab     Status: None    Collection Time: 10/19/20  4:36 PM    Specimen: Nasopharyngeal Swab; Nasopharyngeal(NP) swabs in vial  transport medium   Result Value Ref Range Status    SARS Coronavirus 2 by RT PCR NEGATIVE NEGATIVE Final     Comment: (NOTE)  SARS-CoV-2 target nucleic acids are NOT DETECTED.    The SARS-CoV-2 RNA is generally detectable in upper respiratory  specimens during the acute phase of infection. The lowest  concentration of SARS-CoV-2 viral copies this assay can detect is  138 copies/mL. A negative result does not preclude SARS-Cov-2  infection and should not be used as the sole basis for treatment or  other patient management decisions. A negative result may occur with   improper specimen collection/handling, submission of specimen other  than nasopharyngeal swab, presence of viral mutation(s) within the  areas targeted by this assay, and inadequate number of viral  copies(<138 copies/mL). A negative result must be combined with  clinical observations, patient history, and epidemiological  information. The expected result is Negative.    Fact Sheet for Patients:   BloggerCourse.com    Fact Sheet for Healthcare Providers:   SeriousBroker.it    This test is no t yet approved or cleared by the [place] FDA and   has been authorized for detection and/or diagnosis of SARS-CoV-2 by  FDA under an Emergency Use Authorization (EUA). This EUA will remain   in effect (meaning this test can be used) for the duration of the  COVID-19 declaration under Section 564(b)(1) of the Act, 21  U.S.C.section 360bbb-3(b)(1), unless the authorization is terminated   or revoked sooner.        Influenza A by PCR NEGATIVE NEGATIVE Final    Influenza B by PCR NEGATIVE NEGATIVE Final     Comment: (NOTE)  The Xpert Xpress SARS-CoV-2/FLU/RSV plus assay is intended as an aid  in the diagnosis of influenza from Nasopharyngeal swab specimens and  should not be used as a sole basis for treatment. Nasal washings and  aspirates are unacceptable for Xpert Xpress SARS-CoV-2/FLU/RSV  testing.    Fact Sheet  for Patients:  BloggerCourse.com    Fact Sheet for Healthcare Providers:  SeriousBroker.it    This test is not yet approved or cleared by the [place] FDA and  has been authorized for detection and/or diagnosis of SARS-CoV-2 by  FDA under an Emergency Use Authorization (EUA). This EUA will remain  in effect (meaning this test can be used) for the duration of the  COVID-19 declaration under Section 564(b)(1) of the Act, 21 U.S.C.  section 360bbb-3(b)(1), unless the authorization is terminated or  revoked.    Performed at [facility], [address] [place], [place] [address]    Blood culture (routine single)     Status: None (Preliminary result)    Collection Time: 10/19/20  5:44 PM    Specimen: Site Not Specified; Blood   Result Value Ref Range Status    Specimen Description SITE NOT SPECIFIED  Final    Special Requests   Final     BOTTLES DRAWN AEROBIC AND ANAEROBIC Blood Culture adequate volume  Performed at [facility], [address] [place], [place] [address]     Culture PENDING  Incomplete    Report Status PENDING  Incomplete       Radiology Studies:  DG CHEST PORT 1 VIEW    Result Date: 10/20/2020  CLINICAL DATA:  Respiratory failure EXAM: PORTABLE CHEST  1 VIEW COMPARISON:  10/19/2020. FINDINGS: Endotracheal tube 5.2 cm above the carina. Nasogastric tube extends into the upper abdomen. Pulmonary insufflation is improved. The lungs are clear. No pneumothorax or pleural effusion. Cardiac size within normal limits. Pulmonary vascularity is normal. IMPRESSION: Stable support tubes. Improved pulmonary insufflation. No focal pulmonary infiltrate. Are Electronically Signed   By: Helyn Numbers MD   On: 10/20/2020 04:34     DG Chest Port 1 View    Result Date: 10/19/2020  CLINICAL DATA:  Drug overdose. Possible sepsis. Endotracheal tube placement. EXAM: PORTABLE CHEST 1 VIEW COMPARISON:  07/06/2020 FINDINGS: Endotracheal tube and nasogastric tube are seen in  appropriate position. Low lung volumes are again noted. Bibasilar atelectasis is seen, without evidence of pulmonary consolidation or edema. No evidence of pleural effusion. Stable heart size. IMPRESSION: Endotracheal tube and nasogastric tube in appropriate position. Low lung volumes with bibasilar atelectasis. Electronically Signed   By: Danae Orleans M.D.   On: 10/19/2020 17:44     Scheduled Meds:  ? docusate  100 mg Per Tube BID   ? heparin  5,000 Units Subcutaneous Q8H   ? polyethylene glycol  17 g Per Tube Daily     Continuous Infusions:  ? sodium chloride 100 mL/hr at 10/20/20 0241   ? sodium chloride 50 mL/hr at 10/20/20 0241   ? dexmedetomidine (PRECEDEX) IV infusion Stopped (10/19/20 2209)   ? norepinephrine (LEVOPHED) Adult infusion Stopped (10/20/20 0520)   ? propofol (DIPRIVAN) infusion 20 mcg/kg/min (10/20/20 0526)      LOS: 1 day     Critical care procedure note  Authorized and performed by: Erick Blinks  Total critical care time: Approximately 35 minutes  Due to high probability of clinically significant, life-threatening deterioration, the patient required my highest level of preparedness to intervene emergently and I personally spent this critical care time directly and personally managing the patient.  The critical care time included obtaining a history, examining the patient, pulse oximetry, ordering and review of studies, arranging urgent treatment with development of a management plan, evaluation of patient's response to treatment, frequent reassessment, discussions with other providers.  Critical care time was performed to assess and manage the high probability of imminent, life-threatening deterioration that could result in multiorgan failure.  It was exclusive of separate billable procedures and treating other patients and teaching time.  Please see MDM section and the rest of the of note for further information on patient assessment and treatment    Erick Blinks, MD  [facility]    If 7PM-7AM, please contact night-coverage  [email or web address]    10/20/2020, 7:17 AM     Electronically signed by Erick Blinks, MD at 10/20/2020  7:31 AM EDT

## (undated) NOTE — Unmapped External Note (Signed)
Formatting of this note is different from the original.  Referral information for Psychiatric Hospitalization faxed to the following facility at the request of the patient    ? [facility] [place] [phone]) Unable to accept patient, currently on diversion     Electronically signed by Andee Poles A, LCAS-A at 02/16/2020  2:59 AM EDT

## (undated) NOTE — ED Notes (Signed)
Formatting of this note might be different from the original.  Patient took shower, ask for phone, he is calm and cooperative, will continue to monitor.   Electronically signed by Andres Shad, RN at 02/16/2020  9:46 AM EDT

## (undated) NOTE — Progress Notes (Signed)
Formatting of this note is different from the original.  Neurology Progress Note    S://  Pt is seen and examined today. Pt is alert and oriented to self, age, knows year but doesn't know month. Thinks its february. Able to follow commands.    Attending addendum: To me, he was able to tell me in a very dysarthric speech that he took multiple 25 mg alcohol tablets.  He said he also took ibuprofen and Naprosyn.    O://  Current vital signs:  BP (!) 148/105 (BP Location: Left Arm)   Pulse 100   Temp 98.1 F (36.7 C) (Oral)   Resp 20   Ht 5\' 10"  (1.778 m)   Wt 95.3 kg   SpO2 98%   BMI 30.13 kg/m   Vital signs in last 24 hours:  Temp:  [98 F (36.7 C)-99.1 F (37.3 C)] 98.1 F (36.7 C) (01/13 1151)  Pulse Rate:  [28-153] 100 (01/13 1151)  Resp:  [18-26] 20 (01/13 1151)  BP: (103-171)/(74-118) 148/105 (01/13 1151)  SpO2:  [46 %-100 %] 98 % (01/13 1151)  GENERAL: Awake, alert in NAD  HEENT: - Normocephalic and atraumatic, dry mucous membranes.  LUNGS - symmetrical chest rise, No labored breathing noted.  CV - No JVD, No peripheral edema.  ABDOMEN - Soft,  nondistended   Ext: warm, well perfused, no edema  NEURO:   Mental Status: Sleeping, opens eyes to voice, is able to answer simple questions.  Oriented to self and place.  Speech is dysarthric-mostly due to dry mouth  Poor attention concentration  Cranial Nerves: PERRL,  EOMI, visual fields full, no facial asymmetry, facial sensation intact, hearing intact, tongue/uvula/soft palate midline, normal sternocleidomastoid and trapezius muscle strength. No evidence of tongue atrophy or fibrillations. Fissured Tongue   Motor: B/L Hand strength 5/5. Not able to assess Arms and LE's strength as Pt has restrains in all four extremities.   Tone: is normal and bulk is normal  Sensation- Intact to light touch bilaterally  Coordination: Not able to perform because of Restrains.  DTRs: Brisk in the knees.  Gait- deferred    Medications    Current Facility-Administered  Medications:   ?  acetaminophen (TYLENOL) tablet 650 mg, 650 mg, Oral, Q6H PRN **OR** acetaminophen (TYLENOL) suppository 650 mg, 650 mg, Rectal, Q6H PRN, Jonah Blue, MD  ?  bisacodyl (DULCOLAX) EC tablet 5 mg, 5 mg, Oral, Daily PRN, Jonah Blue, MD  ?  dextrose 5 % in lactated ringers infusion, , Intravenous, Continuous, Arrien, York Ram, MD, Last Rate: 75 mL/hr at 07/08/20 0824, New Bag at 07/08/20 0824  ?  diphenhydrAMINE (BENADRYL) injection 25 mg, 25 mg, Intravenous, Q6H PRN, Milon Dikes, MD, 25 mg at 07/07/20 2042  ?  enoxaparin (LOVENOX) injection 40 mg, 40 mg, Subcutaneous, Q24H, Jonah Blue, MD, 40 mg at 07/07/20 1809  ?  folic acid injection 1 mg, 1 mg, Intravenous, Daily, Jonah Blue, MD, 1 mg at 07/08/20 1107  ?  hydrALAZINE (APRESOLINE) injection 5 mg, 5 mg, Intravenous, Q4H PRN, Jonah Blue, MD, 5 mg at 07/07/20 2042  ?  LORazepam (ATIVAN) injection 0-4 mg, 0-4 mg, Intravenous, Q4H, 3 mg at 07/08/20 0158 **FOLLOWED BY** LORazepam (ATIVAN) injection 0-4 mg, 0-4 mg, Intravenous, Q8H, Jonah Blue, MD  ?  LORazepam (ATIVAN) tablet 1-4 mg, 1-4 mg, Oral, Q1H PRN **OR** LORazepam (ATIVAN) injection 1-4 mg, 1-4 mg, Intravenous, Q1H PRN, Jonah Blue, MD, 2 mg at 07/07/20 2254  ?  morphine 2 MG/ML injection 2  mg, 2 mg, Intravenous, Q2H PRN, Jonah Blue, MD, 2 mg at 07/08/20 0158  ?  multivitamin with minerals tablet 1 tablet, 1 tablet, Oral, Daily, Jonah Blue, MD  ?  nicotine (NICODERM CQ - dosed in mg/24 hours) patch 14 mg, 14 mg, Transdermal, q1800, Jonah Blue, MD, 14 mg at 07/07/20 1809  ?  ondansetron (ZOFRAN) tablet 4 mg, 4 mg, Oral, Q6H PRN **OR** ondansetron (ZOFRAN) injection 4 mg, 4 mg, Intravenous, Q6H PRN, Jonah Blue, MD  ?  polyethylene glycol (MIRALAX / GLYCOLAX) packet 17 g, 17 g, Oral, Daily PRN, Jonah Blue, MD  ?  sodium chloride flush (NS) 0.9 % injection 3 mL, 3 mL, Intravenous, Q12H, Jonah Blue, MD, 3 mL at 07/08/20 1114  ?   thiamine 500mg  in normal saline (50ml) IVPB, 500 mg, Intravenous, Daily, Pahwani, Daleen Bo, MD, Last Rate: 100 mL/hr at 07/08/20 1112, 500 mg at 07/08/20 1112  Labs  CBC    Component Value Date/Time    WBC 9.9 07/07/2020 0500    RBC 4.28 07/07/2020 0500    HGB 13.5 07/07/2020 0500    HGB 17.4 07/01/2014 0842    HCT 40.4 07/07/2020 0500    HCT 52.4 (H) 07/01/2014 0842    PLT 169 07/07/2020 0500    PLT 244 07/01/2014 0842    MCV 94.4 07/07/2020 0500    MCV 96 07/01/2014 0842    MCH 31.5 07/07/2020 0500    MCHC 33.4 07/07/2020 0500    RDW 13.2 07/07/2020 0500    RDW 13.7 07/01/2014 0842    LYMPHSABS 1.8 07/06/2020 1307    LYMPHSABS 1.9 07/01/2014 0842    MONOABS 0.9 07/06/2020 1307    MONOABS 0.9 07/01/2014 0842    EOSABS 0.1 07/06/2020 1307    EOSABS 0.2 07/01/2014 0842    BASOSABS 0.0 07/06/2020 1307    BASOSABS 0.0 07/01/2014 0842     CMP     Component Value Date/Time    NA 137 07/08/2020 0324    NA 138 07/01/2014 0842    K 3.3 (L) 07/08/2020 0324    K 4.2 07/01/2014 0842    CL 101 07/08/2020 0324    CL 107 07/01/2014 0842    CO2 24 07/08/2020 0324    CO2 25 07/01/2014 0842    GLUCOSE 96 07/08/2020 0324    GLUCOSE 114 (H) 07/01/2014 0842    BUN <5 (L) 07/08/2020 0324    BUN 12 07/01/2014 0842    CREATININE 0.63 07/08/2020 0324    CREATININE 1.07 07/01/2014 0842    CALCIUM 8.9 07/08/2020 0324    CALCIUM 9.0 07/01/2014 0842    PROT 6.1 (L) 07/08/2020 0324    PROT 7.5 07/01/2014 0842    ALBUMIN 3.3 (L) 07/08/2020 0324    ALBUMIN 4.2 07/01/2014 0842    AST 65 (H) 07/08/2020 0324    AST 27 07/01/2014 0842    ALT 69 (H) 07/08/2020 0324    ALT 84 (H) 07/01/2014 0842    ALKPHOS 64 07/08/2020 0324    ALKPHOS 92 07/01/2014 0842    BILITOT 1.4 (H) 07/08/2020 0324    BILITOT 1.3 (H) 07/01/2014 0842    GFRNONAA >60 07/08/2020 0324    GFRNONAA >60 07/01/2014 0842    GFRNONAA >60 05/03/2012 2215    GFRAA >60 03/09/2020 0236    GFRAA >60 07/01/2014 0842    GFRAA >60 05/03/2012 2215     Lipid Panel   No results found for: CHOL, TRIG,  HDL, CHOLHDL, VLDL, LDLCALC,  LDLDIRECT    Imaging  I have reviewed images in epic and the results pertinent to this consultation are:    CT-scan of the brain  Negative for acute intracranial abnormality    Assessment: Pt is a 64 year old male with PMHsignificant for hypertension, depression,anxiety with history of suicidal ideation, alcohol dependence, tobacco dependence presented with excessive movements of extremitieswithconcern for seizures. Pt overdosed on unknown amount of Seroquel+/-BuSpar. Pt is making improvement. Pt has been running high in the ED with systolic of 103-171 and diastolic of 74-129 mmHg. He has been Afebrile. K low at 3.3, AST/ALT 65/69. Pt was put on Lorazepam taper, Benadryl was given once yesterday for Agitation. Valproic acid level <10, CK 804, TSH 1.138, Mg 1.7, Urinalysis negative for infection. CT negative, EEG normal.     Impression:  -Seroquel+/-BuSpar overdose  -Extrapyramidaland Anticholinergicsymptoms  -?Alcohol withdrawal    Recommendations:  -Supportive care per primary team  --Continue Alcohol withdrawal protocol with Ativan, thiamin and Folic acid per primary.   -Follow poison control recs.  --Replete electrolytes if low by primary team.  -Continue Benadryl 25 mg Q6H for treatment of extraparametal symptoms  -- Avoid Antipsychotics.  -Avoid QTC prolonging drugs.  - MRI brain without contrast when stable - possibly tomorrow.    Dr. Karsten Ro MD  PGY1 [facility]    Attending addendum  Patient seen and examined  Imaging reviewed personally  Plan for MRI tomorrow  Supportive care per primary team  Spoke with mother over the phone that she is in [place], and updated her with the plan.    --  Milon Dikes, MD  Neurologist  [facility]  Pager: [phone]      Electronically signed by Milon Dikes, MD at 07/08/2020  1:59 PM EST

## (undated) NOTE — Unmapped External Note (Signed)
Formatting of this note is different from the original.  [facility] Face-to-Face Psychiatry Consult     Reason for Consult: Suicide attempt  Referring Physician:  Dr. Ella Jubilee  Patient Identification: Jeffrey Reese  MRN:  [id number]  Principal Diagnosis: Intentional overdose of drug in tablet form [facility])  Diagnosis:  Principal Problem:    Intentional overdose of drug in tablet form (HCC)  Active Problems:    Major depressive disorder, recurrent severe without psychotic features (HCC)    Alcohol use disorder, severe, dependence (HCC)    Essential hypertension    Tobacco dependence    Class 1 obesity due to excess calories with body mass index (BMI) of 30.0 to 30.9 in adult    Total Time spent with patient: 45 minutes    Subjective:    Jeffrey Reese is a 46 y.o. male patient admitted with suicide attempt.  Patient seen and evaluated by this provider.  He presented to the emergency department, after being found down at the hotel.      Patient is alert and oriented, calm and cooperative. He describes his mood as "irritable", and his affect is labile and congruent. Patient appears to be irritable about a multitude of things to include "waiting for a bed, nursing shortage, being taken off wellbutrin, and lack of behavioral health services. He is able to verbalize his decreased interest in seeking inpatient admission. He states "I have been inpatient about 5-6 times, and I went to [facility] stayed there for a week.  I dont think I need to go. I have a diagnosis of Bipolar Mania. I have had the therapy. I cant un do what I did, but I dont think I need to go back to sit in the hospital. Now Im under IVC, and Im about to request my patient court attorney because I been in the hospital way too long and Im coherent now. " Patient continues to present with irritability and argumentative tone, he expresses interest in IOP. However during his rant he expresses much dislike about virtual services, and he works [employer]. Reviewed  the different level of services to include CDIOP, IOP, and PHP. He currently denies suicidal ideations.    HPI: Jeffrey Reese a 39 y.o.malepast medical history significant for hypertension, depression/anxiety with history of suicidal ideation, alcohol dependence, tobacco dependence presented with excessive movements of extremitieswithconcern for seizures.Patient overdosed on unknown amount of Seroquel +/-BuSpar,found facedown at hotel room atapprox 0700.EMS gave Narcan w/nochange.Patient blood pressure has been running high since admission with systolic of 102-152 and diastolic of 80 to 120 mmHg.Patient is tachycardic with pulse rate ranging from 106-127/minute.He is afebrile and satting well at room air.CT head negative for acute intracranial pathology.Ethyl alcohol level is high at 96.AST andALT mildly elevated at 53 and 64 respectively.Acetaminophen level <10.QTc mildly elevated at 486. Pt has alcohol use disorder and was admitted for alcohol withdrawal recently.    Past Psychiatric History:Bipolar I disorder. Currently under the care of [name] at [facility]. He was previously taking Depakote and Wellbutrin, Seroquel and Buspar. He remains disappointed that he was stopped off the Wellbutrin. He reports multiple inpatient admissions, most recently [facility] for alcohol use disorder. He has attempted suicide too many times to count, in which three have required medical admissions.     Legal charges: Pending for assault by pointing a weapon, carrying concealed weapon with no license, carrying concealed weapon while intoxicated with no license, DWI, communicating threat, open container, and driving without taillight.  Risk to Self:  Yes  Risk to Others:  No  Prior Inpatient Therapy:  [facility]  Prior Outpatient Therapy:  Currently receiving services at [facility] [place].    Past Medical  History:   Past Medical History:   Diagnosis Date   ? Alcohol dependence (HCC)    ? 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 Hypertension    ? Suicidal ideation      Past Surgical History:   Procedure Laterality Date   ? TONSILLECTOMY       Family History: No family history on file.  Family Psychiatric  History: As per patient father diagnosed with" paranoid delusion disorder."   Patient also reports family history maternal and paternal of depression.  Social History:   Social History     Substance and Sexual Activity   Alcohol Use Yes   ? Alcohol/week: 24.0 standard drinks   ? Types: 24 Cans of beer per week     Social History     Substance and Sexual Activity   Drug Use No     Social History     Socioeconomic History   ? Marital status: Single     Spouse name: Not on file   ? Number of children: Not on file   ? Years of education: Not on file   ? Highest education level: Not on file   Occupational History   ? Not on file   Tobacco Use   ? Smoking status: Current Every Day Smoker   ? Smokeless tobacco: Never Used   Substance and Sexual Activity   ? Alcohol use: Yes     Alcohol/week: 24.0 standard drinks     Types: 24 Cans of beer per week   ? Drug use: No   ? Sexual activity: Not on file   Other Topics Concern   ? Not on file   Social History Narrative   ? Not on file     Social Determinants of Health     Financial Resource Strain: Not on file   Food Insecurity: Not on file   Transportation Needs: Not on file   Physical Activity: Not on file   Stress: Not on file   Social Connections: Not on file     Additional Social History:      Allergies:    Allergies   Allergen Reactions   ? Bee Venom Anaphylaxis     Labs:   No results found for this or any previous visit (from the past 48 hour(s)).    Current Facility-Administered Medications   Medication Dose Route Frequency Provider Last Rate Last Admin   ? acetaminophen (TYLENOL) tablet 650 mg  650 mg Oral Q6H PRN Jonah Blue, MD   650 mg at 07/13/20  1610    Or   ? acetaminophen (TYLENOL) suppository 650 mg  650 mg Rectal Q6H PRN Jonah Blue, MD       ? amLODipine (NORVASC) tablet 10 mg  10 mg Oral Daily Coralie Keens, MD   10 mg at 07/13/20 1030   ? bisacodyl (DULCOLAX) EC tablet 5 mg  5 mg Oral Daily PRN Jonah Blue, MD       ? enoxaparin (LOVENOX) injection 40 mg  40 mg Subcutaneous Q24H Jonah Blue, MD   40 mg at 07/11/20 1809   ? folic acid (FOLVITE) tablet 1 mg  1 mg Oral Daily Arrien,  York Ram, MD   1 mg at 07/13/20 1030   ? hydrALAZINE (APRESOLINE) injection 5 mg  5 mg Intravenous Q4H PRN Jonah Blue, MD   5 mg at 07/07/20 2042   ? lidocaine (LIDODERM) 5 % 1 patch  1 patch Transdermal Q24H Coralie Keens, MD   1 patch at 07/12/20 1135   ? lisinopril (ZESTRIL) tablet 10 mg  10 mg Oral Daily Arrien, York Ram, MD   10 mg at 07/13/20 1030   ? multivitamin with minerals tablet 1 tablet  1 tablet Oral Daily Jonah Blue, MD   1 tablet at 07/13/20 1029   ? naproxen (NAPROSYN) tablet 500 mg  500 mg Oral BID WC Arrien, York Ram, MD   500 mg at 07/13/20 0737   ? nicotine (NICODERM CQ - dosed in mg/24 hours) patch 21 mg  21 mg Transdermal q1800 Jeffrey Amos, FNP   21 mg at 07/12/20 1202   ? ondansetron (ZOFRAN) tablet 4 mg  4 mg Oral Q6H PRN Jonah Blue, MD        Or   ? ondansetron [facility]) injection 4 mg  4 mg Intravenous Q6H PRN Jonah Blue, MD       ? polyethylene glycol (MIRALAX / GLYCOLAX) packet 17 g  17 g Oral Daily PRN Jonah Blue, MD       ? sodium chloride flush (NS) 0.9 % injection 3 mL  3 mL Intravenous Steva Colder, MD   3 mL at 07/12/20 2227   ? thiamine tablet 100 mg  100 mg Oral Daily Arrien, York Ram, MD   100 mg at 07/13/20 1030     Musculoskeletal:  Strength & Muscle Tone: decreased  Gait & Station: normal  Patient leans: N/A    Psychiatric Specialty Exam:  Physical Exam  Vitals and nursing note reviewed.   Constitutional:       Appearance: Normal  appearance.   HENT:      Head: Normocephalic.      Nose: Nose normal.   Musculoskeletal:         General: Normal range of motion.      Cervical back: Normal range of motion.   Neurological:      General: No focal deficit present.      Mental Status: He is alert and oriented to person, place, and time.   Psychiatric:         Attention and Perception: Perception normal. He is inattentive.         Mood and Affect: Mood is anxious and depressed.         Speech: Speech normal.         Behavior: Behavior normal. Behavior is cooperative.         Thought Content: Thought content includes suicidal ideation. Thought content includes suicidal plan.         Cognition and Memory: Cognition and memory normal.         Judgment: Judgment normal.       Review of Systems   Psychiatric/Behavioral: Positive for dysphoric mood and suicidal ideas. The patient is nervous/anxious.    All other systems reviewed and are negative.    Blood pressure 121/90, pulse 81, temperature (!) 97.5 F (36.4 C), temperature source Oral, resp. rate 18, height 5\' 10"  (1.778 m), weight 95.3 kg, SpO2 99 %.Body mass index is 30.13 kg/m.   General Appearance: Fairly Groomed   Eye Contact:  Fair   Speech:  Clear and Coherent and  Normal Rate   Volume:  Normal   Mood:  Angry and Irritable, " matter of fact"   Affect:  Blunt, Congruent and Labile   Thought Process:  Coherent, Linear and Descriptions of Associations: Circumstantial   Orientation:  Full (Time, Place, and Person)   Thought Content:  Logical, Rumination and Tangential   Suicidal Thoughts:  Denies at this time, recent attempted by overdose led to hospital admission   Homicidal Thoughts:  No   Memory:  Immediate;   Fair  Recent;   Fair  Remote;   Fair   Judgement:  Poor   Insight:  Present   Psychomotor Activity:  Normal   Concentration:  Concentration: Fair and Attention Span: Fair   Recall:  Jeffrey Reese of Knowledge:  Fair   Language:  Good   Akathisia:  No   Handed:  Right   AIMS (if indicated):       Assets:  Housing  Leisure Time  Physical Health  Resilience   ADL's:  Intact   Cognition:  WNL   Sleep:        Treatment Plan Summary:  Plan Continue current medications.  Recommend working closely with social work to facilitate inpatient psychiatric admission.  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   -Continue CIWA detox protocol  -Continue nicotine patch to 21 mg.  -Continue IVC due to suicide attempt.  There continues to be ongoing safety concerns therefore he continues to meet criteria.  -Will start Depakote DR 250mg  po BID for bipolar mania. Will start vraylar 1.5mg  po daily for bipolar mania, and depressive symptoms.     Alcohol use disorder, severe:  -Ativan alcohol detox started    Disposition: Recommend psychiatric Inpatient admission when medically cleared.    Jeffrey Amos, FNP  07/13/2020 12:46 PM  Electronically signed by Nelly Rout, MD at 07/14/2020 10:30 AM EST    Associated attestation - Nelly Rout, MD - 07/14/2020 10:30 AM EST  Formatting of this note might be different from the original.  Patient needs inpatient psychiatric admission for stabilization and treatment once medically stable

## (undated) NOTE — Progress Notes (Signed)
Formatting of this note might be different from the original.  OT Cancellation Note    Patient Details  Name: Jeffrey Reese  MRN: [id number]  DOB: April 24, 1981    Cancelled Treatment:    Reason Eval/Treat Not Completed: Patient declined reporting continued agitation after a hectic morning. Patient states that he does not want to take his anger out on anyone and would prefer for OT to return at a later time. OT to check back as time allows.     Addendum: 2nd attempt this afternoon but patient seated EOB eating lunch. OT will continue efforts when patient is available.     Kallie Edward OTR/L  Supplemental OT, Department of rehab services [phone]    Andree Moro  07/12/2020, 11:35 AM  Electronically signed by Tama Headings, OT at 07/12/2020  1:13 PM EST

## (undated) NOTE — Progress Notes (Signed)
Formatting of this note might be different from the original.  Patient being discharged to [facility], accepted to the service of Dr. Jola Babinski, room 401-1. Safe transport [phone]) here to pick up patient. Patient voluntary form signed. Copy put in patient chart and one sent with patient. Patient belongings sent with him. Both IVs removed. Patient alert and orient x4. Patient wheeled down in wheelchair.   Electronically signed by Carney Corners, LPN at 29/56/2130 12:29 AM EDT

## (undated) NOTE — Progress Notes (Signed)
Formatting of this note might be different from the original.  Patient is medically stable for discharge to inpatient psych.     Vs BP 131/75, HR 89, RR 18, 02 sat 99%    Physical examination not changed.    Follow with psych recommendations, he is stable for discharge to inpatient psych.   Electronically signed by Coralie Keens, MD at 07/13/2020  2:48 PM EST

## (undated) NOTE — ED Notes (Signed)
Formatting of this note might be different from the original.  Paged Dr Loney Loh to [facility]  Electronically signed by Fredderick Erb A at 07/07/2020  6:03 AM EST

---

## 2008-01-28 DIAGNOSIS — F528 Other sexual dysfunction not due to a substance or known physiological condition: Secondary | ICD-10-CM | POA: Insufficient documentation

## 2009-04-16 ENCOUNTER — Ambulatory Visit
Admit: 2009-04-16 | Discharge: 2009-04-16 | Disposition: A | Discharge status: Home / Self Care | Payer: Self-pay | Source: Ambulatory Visit | Attending: Primary Care | Admitting: Primary Care

## 2009-12-03 ENCOUNTER — Encounter: Payer: Self-pay | Admitting: Gastroenterology

## 2009-12-03 ENCOUNTER — Ambulatory Visit: Payer: Self-pay | Admitting: Primary Care

## 2009-12-07 NOTE — Progress Notes (Signed)
Reason For Visit   C/O bilateral eye irritation started yesterday. Today right eye swollen.   MBabcock< LPN.  HPI   Jeffrey Reese presents to the office with complaints of redness and tenderness   around his eyes since yesterday morning.     He reports that he first noticed that his eyelids felt heavy yesterday   morning while at work. He had to concentrate on keeping his eyes open at   times. He was aware of a gritty, foreign body sensation in his right eye   and it began watering frequently. He has also had some clear, watery   discharge from his right nostril. He has developed a frontal headache and   has found that his eyes feel sensitive to the light.     He denies any stuffy nose, sneezing, itchy nose, itchy eyes, ears symptoms,   postnasal drip, sore throat, cough, wheezing, shortness of breath,   heartburn, nausea, vomiting, diarrhea, constipation, fevers, chills or   sweats. He denies any vision changes. He has never had symptoms of this   nature in the past. He is not aware of any recent exposure to dust, vapors,   fumes, gas or smoke. He has not been handling any chemicals or other   unusual substances recently. He has not eaten anything out of the ordinary   or done any recent traveling. He has not taken any medications recently. He   does not wear contact lenses and has never had any treatments, surgeries or   procedures done to his eyes.     He has tried irrigating his eyes with saline solution which seemed to help   a little. He has not noticed any crusting, bleeding or discharge on or   around his eyes other than the tearing he described.  Allergies   Latex-asked/denied  No Known Drug Allergy.  Current Meds   ** Medication reconciliation completed and patient declined printed list.   **.  Active Problems   Male Erectile Disorder (302.72).  Vital Signs   Recorded by Neomia Dear on 03 Dec 2009 08:08 AM  BP:128/92,   HR: 84 b/min,   Weight: 221 lb,   O2 Sat: 98 (%SpO2).  Physical Exam   General: Alert,  pleasant, uncomfortable-appearing young man in no apparent   distress.  HEENT: PERRLA, EOMI. There is mild to moderate erythema and edema present   on the eyelids and perioriorbital region of the right eye. This is causing   mild to moderate impingement on the visibility of the eye. The sclera of   the right eye has minimal to mild hyperemia. There is a greater amount of   hyperemia on the opposing conjunctival surface. There are no papules,   pustules, vesicles, crusts, nodules or ulcerations identified on either of   the eyes. There is mild erythema and edema present on the eyelids and   periorbital region of the left side. There is minimal impingement on the   visibility of this eye. The sclera and conjunctiva of the left eye has   minimal to mild hyperemia. There is no bleeding or discharge present from   either of the eyes. Nasal mucosa is pink and moist. TMs WNL. Oropharynx   negative.   Neck: Supple, no lymphadenopathy, no thyromegaly, 2+ carotid pulses   bilaterally.  Heart: Regular rate and rhythm, no murmur.  Lungs: Clear to auscultation bilaterally.  Abdomen: Positive bowel sounds, soft, nontender, nondistended, no masses,   no organomegaly.  Extremities: 2+ radial  and 2+ posterior tibial pulses bilaterally.  No   clubbing, cyanosis or edema.  Assessment   Periorbital cellulitis.  Plan   1. Initiate Augmentin 875/125 twice daily for 10 days.  2. Initiate prednisone 20 mg, 3 tablets once daily for 3 days, then 2   tablets once daily for 3 days, then 1 tablet once daily for 3 days.  3. We contacted the office of [facility] during today's   appointment. Patient was scheduled for an urgent visit for later this   morning. He agreed to attend this visit for further evaluation and   management of his eye problem.  4. RTO as needed if any persistent, recurrent or worsening problems in   regards to the above.  Signature   Electronically signed by: Janeal Holmes  M.D.; 12/07/2009 8:57 PM EST.

## 2010-05-10 ENCOUNTER — Emergency Department
Admission: EM | Admit: 2010-05-10 | Disposition: A | Discharge status: Home / Self Care | Payer: Self-pay | Source: Ambulatory Visit

## 2010-05-10 ENCOUNTER — Encounter: Payer: Self-pay | Admitting: Emergency Medicine

## 2010-05-10 HISTORY — DX: Alcohol dependence, uncomplicated: F10.20

## 2010-05-10 LAB — CBC AND DIFFERENTIAL
Baso # K/uL: 0.1 THOU/uL (ref 0.0–0.1)
Basophil %: 0.7 % (ref 0.2–1.2)
Eos # K/uL: 0.2 THOU/uL (ref 0.0–0.5)
Eosinophil %: 1.4 % (ref 0.8–7.0)
Hematocrit: 53 % — ABNORMAL HIGH (ref 40–51)
Hemoglobin: 19 g/dL — ABNORMAL HIGH (ref 13.7–17.5)
Lymph # K/uL: 3.4 THOU/uL (ref 1.3–3.6)
Lymphocyte %: 27.4 % (ref 21.8–53.1)
MCV: 92 fL (ref 79–92)
Mono # K/uL: 1 THOU/uL — ABNORMAL HIGH (ref 0.3–0.8)
Monocyte %: 8.1 % (ref 5.3–12.2)
Neut # K/uL: 7.9 THOU/uL — ABNORMAL HIGH (ref 1.8–5.4)
Platelets: 278 THOU/uL (ref 150–330)
RBC: 5.8 MIL/uL (ref 4.6–6.1)
RDW: 13.4 % (ref 11.6–14.4)
Seg Neut %: 62.4 % (ref 34.0–67.9)
WBC: 12.6 THOU/uL — ABNORMAL HIGH (ref 4.2–9.1)

## 2010-05-10 LAB — HEMOLYZED TEST

## 2010-05-10 LAB — RUQ PANEL (ED ONLY)
ALT: 70 U/L — ABNORMAL HIGH (ref 0–50)
Albumin: 5 g/dL (ref 3.5–5.2)
Amylase: 66 U/L (ref 28–100)
Bilirubin,Direct: <0.2 mg/dL (ref 0.0–0.3)
Bilirubin,Total: 0.4 mg/dL (ref 0.0–1.2)
Lipase: 42 U/L (ref 13–60)
Total Protein: 8 g/dL — ABNORMAL HIGH (ref 6.3–7.7)

## 2010-05-10 LAB — ETHANOL: Ethanol: 207 mg/dL

## 2010-05-10 LAB — BASIC METABOLIC PANEL
Anion Gap: 16 (ref 7–16)
CO2: 23 mmol/L (ref 20–28)
Calcium: 9.5 mg/dL (ref 9.0–10.3)
Chloride: 101 mmol/L (ref 96–108)
Creatinine: 0.82 mg/dL (ref 0.67–1.17)
GFR,Black: >59 *
GFR,Caucasian: >59 *
Glucose: 103 mg/dL (ref 74–106)
Lab: 6 mg/dL (ref 6–20)
Sodium: 140 mmol/L (ref 133–145)

## 2010-05-10 LAB — ACETAMINOPHEN LEVEL

## 2010-05-10 LAB — SALICYLATE LEVEL

## 2010-05-10 NOTE — ED Notes (Signed)
 Social work involved, okay for discharge.

## 2010-05-10 NOTE — Progress Notes (Signed)
 Contacts: Dr. Loetta Rough, Pts parents, pt    Intervention:   Jeffrey Reese is a 29 y.o. male who SW was asked to see for alcohol resources. Pt states he has a drinking problem. Pt's parents with him, state they tried to call pt's health insurance but could not get through, to find out if any programs are covered. Pt given resource list and encouraged to call and to call his insurance.    Writer gave pt his pink clothing bag from the trauma closet and pt will be discharged.    Plan:   No further social work needs at this time. Please call social work if further needs arise.    Harvin Hazel, LMSW [phone]

## 2010-05-10 NOTE — ED Provider Notes (Addendum)
 History   Chief Complaint   Patient presents with   . Alcohol Intoxication       HPI Comments: Jeffrey Reese is a 29 y.o. Male with a history of alcoholism is here requesting rehab. The patient went on a three day drinking binge, but he does normally drink daily greater than 3 12oz beers per day. He finally requested help so his mother brought him here. Denies any other drugs, denies any thoughts of suicide or homicide. No history of withdrawal, DTs but he has never tried to stop before. Denies any abdominal pain, nausea, vomiting.     The history is provided by the patient.       Past Medical History   Diagnosis Date   . Alcoholism        History reviewed.  No pertinent past surgical history.    History reviewed.  No pertinent family history.     reports that he has never smoked. He does not have any smokeless tobacco history on file.  He reports that he drinks alcohol.  He reports that he does not currently use illicit drugs.    Review of Systems   Review of Systems   Constitutional: Negative for fever and chills.   HENT: Negative for sore throat and neck stiffness.    Eyes: Negative for photophobia and visual disturbance.   Respiratory: Negative for cough, chest tightness and shortness of breath.    Cardiovascular: Negative for chest pain and leg swelling.   Gastrointestinal: Negative for vomiting, abdominal pain and diarrhea.   Genitourinary: Negative for dysuria and flank pain.   Musculoskeletal: Negative for back pain.   Skin: Negative for color change and rash.   Neurological: Negative for weakness, light-headedness and numbness.   Psychiatric/Behavioral: Negative for confusion.       Physical Exam   BP 157/104  Pulse 103  Temp 36.8 C (98.2 F)  Resp 16  SpO2 97%    Physical Exam   Nursing note and vitals reviewed.  Constitutional: He is oriented to person, place, and time. He appears well-developed and well-nourished. No distress.        Flushed, but otherwise AOx3, slightly irritable   HENT:    Head: Normocephalic and atraumatic.   Nose: Nose normal.   Mouth/Throat: Oropharynx is clear and moist.   Eyes: Conjunctivae and EOM are normal. Pupils are equal, round, and reactive to light.   Neck: Normal range of motion. Neck supple.   Cardiovascular: Normal rate, regular rhythm and normal heart sounds.    Pulmonary/Chest: Effort normal and breath sounds normal. No respiratory distress.   Abdominal: Soft. Bowel sounds are normal. He exhibits no distension. No tenderness.   Musculoskeletal: He exhibits no edema and no tenderness.   Neurological: He is alert and oriented to person, place, and time.   Skin: Skin is warm and dry.   Psychiatric: He has a normal mood and affect.       Medical Decision Making   MDM  Number of Diagnoses or Management Options  Acute alcoholic intoxication:   Diagnosis management comments: Patient seen by me at 05/10/2010, 1612 PM    Assessment:  29 y.o., male comes to the ED with alcohol intoxication    Differential Diagnosis includes alcohol dependence/intoxication, no evidence of withdrawal    Plan:   We will obtain the following tests: cbc, ed7, etoh level, aspirin/tylenol level  We will treat the patient with: social work consult     Disposition will depend on the  patient's symptom control, reassessment, and workup. The patient is not currently withdrawing, clinically sober with a safe ride with his parents home. I discussed with the parents and patient that unfortunately there are no rehab facilities here. We will have social work see them and give them resources on rehab programs. Anticipate discharge.     Candace Cruise MD 05/10/2010, 4:51 PM          Candace Cruise, MD    Patient seen by me today, 05/10/2010 at 1630    History:   I reviewed this patient, reviewed the resident note and agree  Exam:   I examined this patient, reviewed the resident note and agree    Decision Making:   I discussed with the documented resident decision making  and agree    Pt presents with mother for concern  about intoxication and seeking rehab.  Daily ETOH use.  No H/O ETOH withdrawal or seizures in the past.    belligerent and swearing to me       Chest: CTA bilaterally, no wheeze or crackles  Neuro: awake and alert, no tremors, no signs of withdrawal    Concern for ETOH dependence.  Pt told of no rehab facilities here.  Social work and to suggest resources.     Author Levora Angel, MD      Levora Angel, MD  05/10/10 [phone]

## 2010-05-10 NOTE — Discharge Instructions (Signed)
As always, when you come to the emergency department, you should follow up with your primary care doctor. Follow up with your primary care doctor within the next 3-5 days. Call first thing in the morning to make an appointment. If you don't have a primary care doctor, refer to the above list for suggestions.     Please return to the emergency department immediately if your symptoms are worse or if you have other concerning symptoms.    Alcohol - How Much Is Too Much?     1 (ONE) DRINK CAN BE TOO MUCH WHEN YOU ARE:    At work or going back to work.    Pregnant or breast-feeding.    Taking medicines (ask your doctor).    Driving or planning to drive.      ALCOHOL AND INJURY   There is a strong relationship between alcohol and car accidents. Alcohol is also often the cause of many other injuries such as:    Falls.    Domestic violence.    Drowning.    Pedestrian injuries.    Assaults.    Work injuries.   Burns.     Sports injuries.       HOW ALCOHOL AFFECTS YOUR HEALTH   Heavy drinkers may have long-term health effects such as:    Damage to the liver, pancreas, brain, and heart. They may put themselves at more risk for some kinds of cancers.    Women who drink when they are pregnant have more risk of having a baby with birth defects.      WHAT IS A STANDARD DRINK?   A standard drink is     1 regular beer (12 ounce/355 ml).    1 glass of wine (5 ounces/148 ml).    1 shot of liquor (1.5 ounces/44 ml).     BLOOD ALCOHOL LEVELS AND YOUR BODY:    .00 Sober.    Marland Kitchen03 No trouble keeping balance, talking, or seeing right, but may feel a "buzz."    .05 Feel a "buzz," feel relaxed - social drinking.    Marland Kitchen08 or .10 Legally drunk (depending on the state), trouble keeping balance, trouble talking and seeing right   .15 Loss of physical control, may have blackouts.    .20 Staggering, vomiting, "sloppy drunk."   .30 Become unconscious.    Marland Kitchen40+ Coma, death is possible.      Even if you do not feel drunk when your  blood alcohol level is .08 or higher, the alcohol is still hurting your brain and liver, and affecting your behavior and ability to perform tasks such as driving. How alcohol affects you depends on your age, gender and how often you drink.     LOW RISK DRINKING   Men  l 2 drinks per day  l 14 drinks per week   Women  l 1 drink per day  l 7 drinks per week.   Age 54 or older  l 1 drink per day.  l 7 drinks per week.     Easy-to-Read style based on content from [facility], [place], [place]     Document Released: 04/08/2009  Document Re-Released: 04/09/2009  [facility] Patient Information 2011 [place], [place].    Acute Alcohol Intoxication     Acute (sudden) alcohol intoxication means the level of your blood alcohol tested was above legal limits. Alcohol is a drug and like other drugs, it has significant side effects. In any dose of alcohol the  following are some of the side effects:   It slows down your reflexes. You will respond more slowly in times of emergency.   It impairs judgment and decreases your muscle coordination in higher doses.   Over time it can cause damage to organs of the body, mainly the liver, but also the nervous system and blood system.     SOCIAL PROBLEMS CAUSED ARE:   One of the leading causes of fatal auto accidents.   A leading cause of divorce and domestic abuse.   A significant factor in loss of jobs.     Alcohol is an addiction and is a complex habit to treat. Your caregiver or substance abuse centers can help you receive treatment if it is needed.     As difficult as this problem may seem to you, help is available. You do not have to solve this problem by yourself. Simply try to realize if you have a problem and then ask for help.     SEEK IMMEDIATE MEDICAL CARE IF:   You become shaky or tremulous when trying to stop drinking. DO NOT DRIVE IF YOU HAVE BEEN DRINKING!   You shake uncontrollably (seizure).   You vomit up blood. This may be bright red or look like  black coffee grounds. You have blood in the stool. This may be bright red or appear as a black tarry, foul smelling stool.   You become light headed or faint. DO NOT DRIVE IF YOU FEEL THIS WAY. Have someone else drive you or call  (098 in [place] for help.     Your caregiver has determined that you completely understand your medical condition, and that your mental state is back to normal.  You understand that you have been treated for alcohol intoxication, have agreed not to drink any alcohol for a minimum of 1 day, will not operate a car or other machinery for 24 hours, and have had an opportunity to ask any questions about your condition.       Document Released: 03/22/2005  Document Re-Released: 09/08/2008  [facility] Patient Information 2011 [place], [place].

## 2010-05-10 NOTE — ED Notes (Signed)
 STATES HAS BEEN DRINKING FOR THE PAST 3 DAYS STRAIGHT AND SEEKING HELP TO STOP HIS DRINKING.

## 2010-05-10 NOTE — ED Notes (Signed)
 Family here with patient, patient requesting to go and family states they will be with him. MD saw and evaluated patient. Patient to be discharged per provider's order.

## 2010-06-07 ENCOUNTER — Inpatient Hospital Stay
Admission: EM | Admit: 2010-06-07 | Disposition: A | Discharge status: Home / Self Care | Payer: Self-pay | Source: Ambulatory Visit | Attending: Psychiatry | Admitting: Psychiatry

## 2010-06-07 ENCOUNTER — Other Ambulatory Visit: Payer: Self-pay | Admitting: Gastroenterology

## 2010-06-07 HISTORY — DX: Essential (primary) hypertension: I10

## 2010-06-07 LAB — CBC AND DIFFERENTIAL
Baso # K/uL: 0.1 THOU/uL (ref 0.0–0.1)
Basophil %: 0.5 % (ref 0.2–1.2)
Eos # K/uL: 0.3 THOU/uL (ref 0.0–0.5)
Eosinophil %: 2.6 % (ref 0.8–7.0)
Hematocrit: 53 % — ABNORMAL HIGH (ref 40–51)
Hemoglobin: 18.9 g/dL — ABNORMAL HIGH (ref 13.7–17.5)
Lymph # K/uL: 3.7 THOU/uL — ABNORMAL HIGH (ref 1.3–3.6)
Lymphocyte %: 34.6 % (ref 21.8–53.1)
MCV: 90 fL (ref 79–92)
Mono # K/uL: 0.8 THOU/uL (ref 0.3–0.8)
Monocyte %: 7.7 % (ref 5.3–12.2)
Neut # K/uL: 5.9 THOU/uL — ABNORMAL HIGH (ref 1.8–5.4)
Platelets: 232 THOU/uL (ref 150–330)
RBC: 5.9 MIL/uL (ref 4.6–6.1)
RDW: 12.9 % (ref 11.6–14.4)
Seg Neut %: 54.6 % (ref 34.0–67.9)
WBC: 10.8 THOU/uL — ABNORMAL HIGH (ref 4.2–9.1)

## 2010-06-07 MED ORDER — THIAMINE HCL 100 MG/ML IJ SOLN *I*
Freq: Once | INTRAMUSCULAR | Status: DC
Start: 2010-06-07 — End: 2010-06-08
  Filled 2010-06-07: qty 1000

## 2010-06-07 MED ORDER — SODIUM CHLORIDE 0.9 % IV BOLUS *I*
1000.0000 mL | Status: AC
Start: 2010-06-07 — End: 2010-06-08
  Administered 2010-06-07: 1000 mL via INTRAVENOUS

## 2010-06-07 MED ORDER — LORAZEPAM 2 MG PO TABS *I*
4.0000 mg | ORAL_TABLET | ORAL | Status: DC | PRN
Start: 2010-06-07 — End: 2010-06-08

## 2010-06-07 NOTE — ED Notes (Signed)
 States he took about 20 "sleep aid"pills that contain Benadryl  and approx 17 beers since 1pm in an attempt to kill self.  AAOX4.  Mom called 911.  MHAd at home.

## 2010-06-07 NOTE — ED Provider Notes (Addendum)
 History   Chief Complaint   Patient presents with   . Drug Overdose   . Suicide Attempt       HPI Comments: [name] is a 29 yo male with a history of alcoholism brought to the ED after a drug overdose and suicide attempt. Mr. Jeffrey Reese took "about 20 sleeping pills and had about 15-20 beers." The pt brought the pill bottle - diphenhydramine 25mg  tablets. The patient is about three weeks out from rehabilitation for his alcoholism. He states that he stayed sober for about two weeks and then started drinking again. He states that the reason he is here today is that his parents called the ambulance and he is upset that "every minute spent here is another dollar". He continues to feel suicidal. Denies feelings of wanting to hurt others. No history of withdrawal, DTs. Denies any abdominal pain, nausea, vomiting.       The history is provided by the patient. No language interpreter was used.       Past Medical History   Diagnosis Date   . Alcoholism          No past surgical history on file.    No family history on file.     reports that he has never smoked. He does not have any smokeless tobacco history on file.  He reports that he drinks alcohol.  He reports that he does not currently use illicit drugs.    Review of Systems   Review of Systems   Constitutional: Positive for fatigue.   HENT: Negative.    Eyes: Negative.    Respiratory: Negative.    Cardiovascular: Negative.    Gastrointestinal: Negative.    Genitourinary: Negative.    Musculoskeletal: Negative.    Skin: Negative.    Neurological: Negative.    Hematological: Negative.    Psychiatric/Behavioral: Negative.        Physical Exam   BP 170/105  Pulse 83  Temp(Src) 36.6 C (97.9 F) (Temporal)  Resp 16  SpO2 94%    Physical Exam   Constitutional: He is oriented to person, place, and time. He appears well-developed and well-nourished. No distress (pt is calm and cooperative).   HENT:   Head: Normocephalic and atraumatic.   Eyes: Pupils are equal, round, and  reactive to light.   Neck: Normal range of motion. Neck supple.   Cardiovascular: Normal rate and regular rhythm.  Exam reveals no gallop and no friction rub.    No murmur heard.  Pulmonary/Chest: Effort normal and breath sounds normal. No respiratory distress. He has no wheezes. He has no rales.   Abdominal: Soft. Bowel sounds are normal. He exhibits no distension. No tenderness. He has no rebound and no guarding.   Musculoskeletal: Normal range of motion. He exhibits no edema and no tenderness.   Neurological: He is alert and oriented to person, place, and time.   Skin: Skin is warm and dry. There is erythema (cheeks and face are flushed).   Psychiatric:        Suicide ideation       Medical Decision Making   MDM  Number of Diagnoses or Management Options  Diagnosis management comments: Patient seen by me today, 06/07/2010 at the time of arrival 10:19 PM    Assessment:  29 y.o., male comes to the ED with drug overdose and suicide attempt    Plan:   - cbc, plasma 7, ekg, ethanol, acetaminophen, and salicylate levels, ucds, glucose  - IVF support, treatment  of withdrawal sx if indicated  - dispo: likely cpep given sustained suicidal ideation         Amount and/or Complexity of Data Reviewed  Clinical lab tests: ordered  Discussion of test results with the performing providers: yes  Discuss the patient with other providers: yes      Renette Butters, MD        Patient seen by me today, 06/08/2010 at 12:20 AM.    History:   I reviewed this patient, reviewed the resident note and agree     Exam:    I examined this patient, reviewed the resident note and agree     Decision Making:   I discussed with the documented resident decision making and agree    ED  Diagnosis:   Alcohol & diphenhydramine ingestion;  suicidal ideation;  depression.    Author Hayes Ludwig, MD          Hayes Ludwig, MD  06/08/10 [phone]

## 2010-06-08 LAB — HEPATIC FUNCTION PANEL
ALT: 35 U/L (ref 0–50)
AST: 34 U/L (ref 0–50)
Albumin: 4.5 g/dL (ref 3.5–5.2)
Alk Phos: 96 U/L (ref 40–130)
Bilirubin,Direct: 0.2 mg/dL (ref 0.0–0.3)
Bilirubin,Total: 0.7 mg/dL (ref 0.0–1.2)
Total Protein: 7.1 g/dL (ref 6.3–7.7)

## 2010-06-08 LAB — SALICYLATE LEVEL: Salicylate: <2 mg/dL — ABNORMAL LOW (ref 15.0–30.0)

## 2010-06-08 LAB — PLASMA PROF 7 (ED ONLY)
Anion Gap,PL: 15 (ref 7–16)
CO2,Plasma: 22 mmol/L (ref 20–28)
Chloride,Plasma: 103 mmol/L (ref 96–108)
Creatinine: 0.77 mg/dL (ref 0.67–1.17)
GFR,Black: >59 *
GFR,Caucasian: >59 *
Glucose,Plasma: 86 mg/dL (ref 74–106)
Potassium,Plasma: 4 mmol/L (ref 3.4–4.7)
Sodium,Plasma: 140 mmol/L (ref 132–146)
UN,Plasma: 5 mg/dL — ABNORMAL LOW (ref 6–20)

## 2010-06-08 LAB — ACETAMINOPHEN LEVEL: Acetaminophen: <1 ug/mL

## 2010-06-08 LAB — POCT GLUCOSE: Glucose POCT: 85 mg/dL (ref 74–106)

## 2010-06-08 LAB — ETHANOL: Ethanol: 123 mg/dL

## 2010-06-08 MED ORDER — METOPROLOL TARTRATE 50 MG PO TABS *I*
50.0000 mg | ORAL_TABLET | Freq: Two times a day (BID) | ORAL | Status: DC
Start: 2010-06-08 — End: 2010-06-13
  Administered 2010-06-09 – 2010-06-13 (×9): 50 mg via ORAL
  Filled 2010-06-08 (×12): qty 1

## 2010-06-08 MED ORDER — CITALOPRAM HYDROBROMIDE 20 MG PO TABS *I*
20.0000 mg | ORAL_TABLET | Freq: Every day | ORAL | Status: DC
Start: 2010-06-08 — End: 2010-06-09

## 2010-06-08 MED ORDER — LORAZEPAM 2 MG PO TABS *I*
2.0000 mg | ORAL_TABLET | ORAL | Status: DC | PRN
Start: 2010-06-08 — End: 2010-06-09

## 2010-06-08 MED ORDER — MAGNESIUM HYDROXIDE 400 MG/5ML PO SUSP *I*
30.0000 mL | Freq: Every day | ORAL | Status: DC | PRN
Start: 2010-06-08 — End: 2010-06-13

## 2010-06-08 MED ORDER — ALUM & MAG HYDROXIDE-SIMETH 200-200-20 MG/5ML PO SUSP *I*
30.0000 mL | Freq: Three times a day (TID) | ORAL | Status: DC | PRN
Start: 2010-06-08 — End: 2010-06-13

## 2010-06-08 MED ORDER — ACETAMINOPHEN 325 MG PO TABS *I*
650.0000 mg | ORAL_TABLET | ORAL | Status: DC | PRN
Start: 2010-06-08 — End: 2010-06-13

## 2010-06-08 MED ORDER — HYDROXYZINE PAMOATE 50 MG PO CAPS *I*
50.0000 mg | ORAL_CAPSULE | Freq: Four times a day (QID) | ORAL | Status: DC | PRN
Start: 2010-06-08 — End: 2010-06-10

## 2010-06-08 NOTE — CPEP Notes (Signed)
 Support System/Patient/Family Information: Pt interviewed, chart reviewed, collateral contacted and case discussed with Gustavus Messing, RN.  Pt is a 29 y.o. S/C/M whom presents to CPEP under MHA after taking an overdose of OTC sleeping pills in context of acute alcohol intoxication.  Pt reports having a brief psychiatric admission to [facility] in [place] several weeks ago but denies any other formal mental health treatment.      Pt states that he has multiple stressors including, current conflict with ex-girlfriend, financial problems (over $100,000 in debt), increased alcohol use with inability to stop use on own and status of job being in jeopardy.  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  Pt began drinking and started to think about how awful his life is.  States he took about 20 sleeping pills with the intention of never waking up.  Prior to taking pills pt had spray painted negative messages all over painted walls in house, took a chain saw to a dining room set that he had purchased for ex-girlfriend and started a bath upstairs that ultimately overflowed and ran into first floor.  Pt states he does not remember much but was awoken by mother and stepfather when they came to check on pt unexpectedly.  Pt states that he wishes either he had not woken up or that he had not ended up in CPEP.  Acknowledges that he is an alcoholic but states ex-girlfriend is biggest problem in life.  States after admission to [facility] employer had informed pt that he had one more chance with job and then would be let go.  Pt believes that he has now lost job because he was a no show/no call for work today.      SW spoke with pts mother Karlyne Greenspan.  She reports that pt has progressively becoming worse over last several weeks.  Both pt and ex-girlfriend have been "regressed" and "poking at each other".  On Monday they were fighting over a coffee  maker that led to police involvement.  Pt was supposed to have called mother yesterday and when pt neglected to do this she became concerned and went to his home check on pt.  States when she arrived pt was sleeping on couch, every wall in house had been spray painted with slurs about ex-girlfriend, the dining room table was chopped up in many pieces by a chain saw and water was running thru the ceiling from the over flowing bath tub.  After pt was MHA mother called the fire department and electricity was turned off in home.  States they are awaiting and inspection by town to determine if the home needs to be condemned.  States she is very concerned about pts safety.  Before Thanksgiving they had found pt very distressed and with a noose made in attic, police were called and this was when pt had a brief admission at [facility]. [facility].  Mother believes pt needs intensive help and is concerned that if pt is discharged from the hospital he may kill himself.  States there is a family history of alcoholism in herself and father and father also has been diagnosed with bipolar.  Mother suspects pt is also bipolar.    Description of Psychosocial Risk(s):   Suspected Substance Abuse: Pt has been drinking 18 beers daily.  States that he was at [facility] inpatient psychiatry last month due to alcohol withdrawal.  Pt resistant to idea of inpatient CD treatment at this time.  Pt has not completes any CD treatment other than [facility] which he recently was connected with.    Problem Identification:     Increase alcohol use  Conflict with ex-girlfriend  Employment is in jeopardy  House is in ruins  Financial strain    Intervention Initiated:    PSA Completed  Collaterals Contacted  Pt currently in process of evaluation for best treatment options

## 2010-06-08 NOTE — ED Notes (Signed)
 Pt arrived to ED via EMS triage MHA'ed for ETOH and benadryl overdose. Pt states he drank 17 beers and took approx 20 Rexall which is benadryl at 4pm. Pt states he was having an argument with his ex-girlfriend. Stating he broke up with her today because "she will not leave my house or pay rent." "I have contacted the police and there is nothing they can do because she is a resident to the house." Per EMS stated that he used a chainsaw to cut up the table and chairs in house and stated he was going to kill himself. Pt denies any suicidal ideation at this time, but expresses that he does not feel safe at home. Pt alert and oriented, placed on monitor. Will continue to monitor and tx per order.

## 2010-06-08 NOTE — ED Notes (Signed)
 BG was 85.  Pt is sleeping.  CRN came around with charge and requesting that a 1:1 for him..  Social work has been contacted.

## 2010-06-08 NOTE — H&P (Addendum)
 [facility] H&P for Psychiatric Inpatients    Consult Requested by: [id number], Becky    Consult Reason: New Admission, needs H & P     HPI: Jeffrey Reese is a 29 y.o. male with PMH of alcohol abuse was MHA'd after being found in his home, passed out on the sofa with the bathtub overflowing through the ceilings following an overdose of 20 Benadryl and ingesting "about 15" beers. Please see ED and CPEP notes for further details.     Past Medical History   Diagnosis Date   . Alcoholism        No past surgical history on file.  No family history on file.  History   Social History   . Marital Status: Single     Spouse Name: N/A     Number of Children: N/A   . Years of Education: N/A   Social History Main Topics   . Smoking status: Never Smoker    . Smokeless tobacco: Not on file   . Alcohol Use: Yes      >21   . Drug Use: No   . Sexually Active:    Other Topics Concern   . Not on file   Social History Narrative   . No narrative on file         Allergies: Allergies   Allergen Reactions   . Zoloft            (Not in a hospital admission)   Current facility-administered medications   Medication Dose Route Frequency   . acetaminophen (TYLENOL) tablet 650 mg  650 mg Oral Q4H PRN   . aluminum & magnesium hydroxide w/ simethicone suspension 30 mL  30 mL Oral Q8H PRN   . magnesium hydroxide (MILK OF MAGNESIA) 400 MG/5ML suspension 30 mL  30 mL Oral Daily PRN   . HydrOXYzine Pamoate (ATARAX/VISTARIL) capsule 50 mg  50 mg Oral Q6H PRN   . lorazepam (ATIVAN) tablet 2 mg  2 mg Oral Q4H PRN   . citalopram (CELEXA) tablet 20 mg  20 mg Oral Daily   . sodium chloride bolus 1,000 mL  1,000 mL Intravenous Bolus   . DISCONTD: M.V.I. Adult 10 mL, thiamine (B-1) 100 mg, folic acid 1 mg, magnesium sulfate 2,000 mg in dextrose 5 % 1,000 mL banana bag (IV Fluid)   Intravenous Once   . DISCONTD: lorazepam (ATIVAN) tablet 4 mg  4 mg Oral Q2H PRN     No current outpatient prescriptions on file.       Review of Systems   Constitutional: Negative.    HENT:  Negative.    Eyes: Negative.    Respiratory: Negative.    Cardiovascular:        Recent diagnosis of HTN placed on Toprol 100 mg daily   Gastrointestinal: Negative.    Genitourinary: Negative.    Musculoskeletal: Negative.    Skin: Negative.    Neurological: Negative.    Endo/Heme/Allergies: Negative.        Last Nursing documented pain:  0-10 Scale: 0 (06/07/10 2220)      Patient Vitals in the past 24 hrs:   BP Temp Temp src Pulse Resp SpO2   06/08/10 0157 116/61 mmHg 36.7 C (98.1 F) TEMPORAL 74  16  100 %   06/07/10 2220 170/105 mmHg 36.6 C (97.9 F) TEMPORAL 83  16  94 %       O2 Device: None (Room air) (06/08/10 0157)      Physical Exam  Constitutional: He is oriented to person, place, and time. Vital signs are normal. He appears well-developed and well-nourished.   HENT:   Head: Normocephalic and atraumatic.        Face reddened as if in a flush   Eyes: Pupils are equal, round, and reactive to light.   Neck: Normal range of motion. Neck supple.   Cardiovascular: Normal rate, regular rhythm, S1 normal, S2 normal, normal heart sounds, intact distal pulses and normal pulses.  Exam reveals no gallop and no friction rub.    No murmur heard.  Pulmonary/Chest: Effort normal and breath sounds normal. He has no wheezes. He has no rhonchi. He has no rales.   Abdominal: Soft. Normal appearance and bowel sounds are normal. He exhibits no distension. No tenderness.   Musculoskeletal: Normal range of motion. He exhibits no edema.   Neurological: He is alert and oriented to person, place, and time. He has normal strength and normal reflexes.   Skin: Skin is warm, dry and intact.       Lab Results: All labs in the last 24 hours   Recent Results (from the past 24 hour(s))   DRUG SCREEN CHEMICAL DEPENDENCY, URINE   Component Value Range   . Remark,UR see text     PLASMA PROF [facility] ED ONLY)    Collection Time    06/07/10 11:33 PM   Component Value Range   . Chloride,Plasma 103  96-108 (mmol/L)   . CO2,Plasma 22  20-28  (mmol/L)   . Potassium,Plasma 4.0  3.4-4.7 (mmol/L)   . Sodium,Plasma 140  132-146 (mmol/L)   . Anion Gap 15  7-16    . UN,Plasma 5 (*) 6-20 (mg/dL)   . Creatinine,Plasma 0.77  0.67-1.17 (mg/dL)   . GFR,CAUCASIAN > 59  (*)   . GFR,BLACK > 59  (*)   . Glucose, Plasma 86  74-106 (mg/dL)   HEPATIC FUNCTION PANEL    Collection Time    06/07/10 11:33 PM   Component Value Range   . Total Protein 7.1  6.3-7.7 (g/dL)   . Albumin 4.5  3.5-5.2 (g/dL)   . Bilirubin, Total 0.7  0.0-1.2 (mg/dL)   . Bilirubin, Direct 0.2  0.0-0.3 (mg/dL)   . Alk Phos 96  40-130 (U/L)   . AST 34  0-50 (U/L)   . ALT 35  0-50 (U/L)   ACETAMINOPHEN LEVEL    Collection Time    06/07/10 11:33 PM   Component Value Range   . Acetaminophen (Tylenol), Serum < 1  (ug/mL)   ETHANOL    Collection Time    06/07/10 11:33 PM   Component Value Range   . Ethanol 123  (mg/dL)   SALICYLATE LEVEL    Collection Time    06/07/10 11:33 PM   Component Value Range   . Salicylate Lvl < 2.0 (*) 15.0-30.0 (mg/dL)   CBC AND DIFFERENTIAL    Collection Time    06/07/10 11:33 PM   Component Value Range   . WBC 10.8 (*) 4.2-9.1 (THOU/uL)   . RBC 5.9  4.6-6.1 (MIL/uL)   . Hemoglobin 18.9 (*) 13.7-17.5 (g/dL)   . Hematocrit 53 (*) 40-51 (%)   . MCV 90  79-92 (fL)   . RDW 12.9  11.6-14.4 (%)   . Platelets 232  150-330 (THOU/uL)   . Seg Neut % 54.6  34.0-67.9 (%)   . Lymphocyte % 34.6  21.8-53.1 (%)   . Monocyte % 7.7  5.3-12.2 (%)   . Eosinophil %  2.6  0.8-7.0 (%)   . Basophil % 0.5  0.2-1.2 (%)   . Neut # K/uL 5.9 (*) 1.8-5.4 (THOU/uL)   . Lymph # K/uL 3.7 (*) 1.3-3.6 (THOU/uL)   . Mono # K/uL 0.8  0.3-0.8 (THOU/uL)   . Eos # K/uL 0.3  0.0-0.5 (THOU/uL)   . Baso # K/uL 0.1  0.0-0.1 (THOU/uL)   POCT GLUCOSE    Collection Time    06/08/10 12:09 AM   Component Value Range   . Glucose, POC 85  74-106 (mg/dL)         Radiology impressions (last 3 days):  No results found.    Currently Active/Followed Hospital Problems:  There are no hospital problems to display for this  patient.      Assessment/Plan: A 29 yo male who was MHA'd after being found in his home, passed out on the sofa with the bathtub overflowing through the ceilings following an overdose of 20 Benadryl and ingesting "about 15" beers.    Alcoholism/Suicidality: Per Psychiatry    HTN: New diagnosis of HTN from [facility] hospital, prescribed Toprol 100 mg. This needs confirmation. Pt states he used the [facility] in [place], tried to call but it was closed. Ordered metoprolol 50 mg BID, this will be a safe medication until his prescription can be confirmed by the primary team.    Leukocytosis: Pt has elevated WBC. Likely due to stress response following OD of medications. No need for treatment unless pt becomes febrile.     Pt appears to be medically stable at this time. If there any questions or concerns please contact [facility].     Author: Maurilio Lovely, NP  Note created: 06/08/2010  at: 6:07 PM

## 2010-06-08 NOTE — ED Notes (Addendum)
 29 yr old Caucasian male presented on MHA after being found in his home, passed out on the sofa with the bathtub overflowing through the ceilings following an overdose of 20 Benadryl and ingesting "about 15" beers.  Pt was found because he was supposed to have called his father in [place] that night and didn't; dad then called mom who remarried and lives in [place] to check on him.  This resulted in the 911 call and the MHA.     Stressors are the ending of the relationship with his 6 yr live in [place] and her 3 children that he helped to raise, financial difficulties involving possibly losing his home due to being 3 months behind in the mortgage payments.  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  Last night, he was frustrated with his situation and took a chain saw and cut the dining room table into pieces and all of the chairs in half, spray painted the walls with obscenities "anything that I felt I had left to say", ingested the 20 benadryl and the beer, started the bathtub and "just hoped that I wouldn't wake up".  Repeatedly said that he's "hit rock bottom and can't get any lower".  Is hopeless, helpless and cannot express anything positive regarding his situation.  Maintains SI passively, will not directly answer the question.  States "If I tell you the truth, you won't let me go and I don't have a week to spend here."      Had 2 week inpatient stay at [facility] as adolescent when he was diagnosed with Bipolar Manic.  Was put on Wellbutrin and Depakote which he took for "about 6 months" before taking self off of the meds.  "I just needed to learn how to live with this thing and not depend on popping pills to get through the day".  When writer pointed out that instead, he had turned to alcohol in an effort to self medicate and that maybe medication would help with the Bipolar as well as reduce  the need for the alcohol, he was still unaccepting and admittedly refused to comply to a medication regimen.

## 2010-06-08 NOTE — ED Notes (Signed)
 Social work notified regarding pt condition.

## 2010-06-08 NOTE — ED Notes (Signed)
 ED RN INTERN ATTESTATION       I Nada Libman, RN (RN) reviewed the following charting information by the RN intern: [facility]    Nursing Assessments  Medications  Plan of Care  Teaching   Notes    In the chart of Jeffrey Reese (29 y.o. male) and attest to the charting being accurate.

## 2010-06-08 NOTE — ED Notes (Signed)
 Todd from CPEP was consulted.

## 2010-06-08 NOTE — Progress Notes (Signed)
 Pt arrived on unit at 1800, was interviewed and admission complete. Exhibits appropriate behaviors and engages well when asked questions. CIWA 0 upon arrival. Pt denies SI currently states he is here because he previously felt suicidal and is overwhelmed with multiple stressors at one time including relationship, financial and health. Has recently been inpatient at [facility] and followed up at a chemical dependency program and started drinking one week ago averaging a 12 pack a day. States he just wants to get out of the hospital to take care of his outside stressors. Does not want to speak with anybody from the outside at this moment. States he does not like taking medications and has only been taking his blood pressure medications. Appears depressed and shameful of actions. Appropriate and logical in thoughts. Able to make needs known. Denies all

## 2010-06-08 NOTE — Plan of Care (Signed)
 To encourage participation in therapeutic activities to assess leisure needs and intersts

## 2010-06-08 NOTE — ED Notes (Signed)
 Re approached Pt to try to use the urinal to obtain Urine sample
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
## 2010-06-09 ENCOUNTER — Encounter: Payer: Self-pay | Admitting: Psychiatry

## 2010-06-09 DIAGNOSIS — F1024 Alcohol dependence with alcohol-induced mood disorder: Secondary | ICD-10-CM | POA: Insufficient documentation

## 2010-06-09 LAB — DRUG SCREEN CHEMICAL DEPENDENCY, URINE
Amphetamine,UR: NEGATIVE
Benzodiazepinen,UR: NEGATIVE
Cocaine/Metab,UR: NEGATIVE
Opiates,UR: NEGATIVE
THC Metabolite,UR: NEGATIVE

## 2010-06-09 MED ORDER — LORAZEPAM 1 MG PO TABS *I*
1.0000 mg | ORAL_TABLET | ORAL | Status: DC | PRN
Start: 2010-06-09 — End: 2010-06-11
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
## 2010-06-09 NOTE — Progress Notes (Signed)
 Utilization Management    Level of Care Inpatient as of the date 06/07/2010      Bascom Levels, RN     Pager: [phone]

## 2010-06-09 NOTE — Progress Notes (Signed)
 Pt. Was friendly on contact and is refusing antipsychotic meds (nicely).  Pt. Made a couple of long distance calls.  Pt. Alert and spent most of shift in his room.  He provided a urine sample for a tox screen.  CIWA of 4 due primarily to anxiety and agitation from being on the [facility] unit as claimed by pt.  Pt. Alert and orientd x3.

## 2010-06-09 NOTE — Progress Notes (Signed)
Slept all night
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
## 2010-06-09 NOTE — Progress Notes (Signed)
 Pt visible in milieu with limited interactions. Pleasant and cooperative on contacts and able to make needs known. Mother and step-father visited and seemed to be supportive, brought clothes. Pt tolerated visit well. Blood pressure remains to be high, MIPS notified. CIWAS low.

## 2010-06-09 NOTE — Progress Notes (Signed)
 Received paige that Clydie Braun at [facility] requested clinical information.  TC to Donald Prose [phone] with clinical information.  Approved 12/13 - 12/18 with next review on 12/19.
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
## 2010-06-10 LAB — EKG 12-LEAD
P: 22 degrees
QRS: 28 degrees
Rate: 72 {beats}/min
Severity: NORMAL
Severity: NORMAL
T: 22 degrees

## 2010-06-10 MED ORDER — DIPHENHYDRAMINE HCL 25 MG PO TABS *I*
ORAL_TABLET | ORAL | Status: AC
Start: 2010-06-10 — End: 2010-06-10
  Administered 2010-06-10: 25 mg
  Filled 2010-06-10: qty 1

## 2010-06-10 MED ORDER — DIPHENHYDRAMINE HCL 25 MG PO TABS *I*
25.0000 mg | ORAL_TABLET | Freq: Four times a day (QID) | ORAL | Status: DC | PRN
Start: 2010-06-10 — End: 2010-06-10

## 2010-06-10 MED ORDER — HYDROXYZINE PAMOATE 50 MG PO CAPS *I*
50.0000 mg | ORAL_CAPSULE | Freq: Four times a day (QID) | ORAL | Status: DC | PRN
Start: 2010-06-10 — End: 2010-06-13
  Administered 2010-06-10 – 2010-06-11 (×2): 50 mg via ORAL
  Filled 2010-06-10 (×2): qty 1

## 2010-06-10 NOTE — Plan of Care (Signed)
 [id number] Interdisciplinary Care Planning      Present:  Jeffrey Ludwig, NP  [name], [place]  [name], RN    Problem List:  .Marland Kitchen  Patient Active Problem List   Diagnoses Code   . Alcohol dependence with alcohol-induced mood disorder 303.90BC     Psychiatric Needs:  Goal: Reduce Psychiatric Symptoms:  Progress/Changes to Care Plan:  Refusing psych meds at this time.     Medical/Physiological Needs:  Goal: Monitor Medical Needs/Address Concerns  Progress/Changes:  None acute at this time.    Discharge Planning:  Goal: Safe discharge  Progress/Changes:  Patient uncooperative in attempting to plan for his treatment and discharge. Won't agree to recommendation to go to rehab. Patient only wants outpatient treatment.

## 2010-06-10 NOTE — Progress Notes (Signed)
 Patient pleasant and cooperative, visible in milieu.  States vistaril is helping with the rash and he denies significant ETOH w/d.  He had some concerns about his blood pressure and was compliant with his medication.

## 2010-06-10 NOTE — Progress Notes (Signed)
 Pt denies all. Pleasant on contacts. CIWA 1 and 1. Pt has red rash like areas on face and hives on upper chest and neck. NP [place] notified. Pt given benadryl. Due to pt having hydroxyzine ordered, benadryl order canceled. Pt to be given hydroxyzine for hive as well as anxiety. Pt out in milieu interacting well with peers.

## 2010-06-10 NOTE — Progress Notes (Signed)
 Psych Social Work Progress Note  Discussed patient with treatment team    Contact with: Patient and Inpatient team/staff (MD,NP, Nurse, Psych Tech)    Contact type: Rounding and Treatment Planning    Purpose of Contact: Continue assessment    Psychosocial Risk Factors Risk Factors: Yes, Suspected Substance Abuse: Yes (daily alcohol use)    Narrative: SW saw pt in rounds with team today.  Pt was calmer and more approachable today.  Team engaged in decision about tx recommendations again.  Pt continues to consistently but up barriers to accepting tx.  He declines inpt rehab and states he might do outpt rehab "if it's affordable".  Pt given blunt f/b by tx team regarding the severity of his addiction and reinforced our recommendations to no avail.    Next Steps: continue to follow case and provide periodic updates to providers and family    [name], [place] [phone]

## 2010-06-10 NOTE — Progress Notes (Signed)
Slept all night
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
## 2010-06-11 NOTE — Progress Notes (Signed)
 Pt pleasant, social. Out in milieu, interacting well with peers. Rash on chest and redness on face appears to be improving. Pt asked for and was given hydroxyzine to help with the remaining hives. CIWA 0, than CIWA's d/c'd.

## 2010-06-11 NOTE — Progress Notes (Signed)
 Future focused doesn't want to go to inpatient  Rehab wants to do it outpatient or go to AA' 'Worrying about his house 'Anxious,restless Denies SI,AVH and HI.Focus on DC

## 2010-06-11 NOTE — Progress Notes (Signed)
slept

## 2010-06-12 NOTE — Progress Notes (Signed)
 Friendly feels like he is caught between "a rock and a hard place" wants inpt. Rehab but has too many stressors at home like house going into foreclosure and bankruptcy etc. Is refusing to go to rehab at this point but will after he can take care of all important issues at home. Writer tried to explain this is his opportunity. But he is determined to go home and take care of issues. Pleasant and intelligent but does minimize alcohol use . He is also stubborn and resistant to medications. Appropriate on unit and well groomed. Denies si will continue to follow.

## 2010-06-12 NOTE — Progress Notes (Signed)
Patient slept.

## 2010-06-13 ENCOUNTER — Encounter: Payer: Self-pay | Admitting: Psychiatry

## 2010-06-13 MED ORDER — METOPROLOL TARTRATE 50 MG PO TABS *I*
50.0000 mg | ORAL_TABLET | Freq: Two times a day (BID) | ORAL | Status: DC
Start: 2010-06-13 — End: 2010-06-26

## 2010-06-13 NOTE — Discharge Instructions (Addendum)
Active Hospital Problems   Diagnoses   . Alcohol dependence with alcohol-induced mood disorder [303.90BC]      Resolved Hospital Problems   Diagnoses       Discharge Date:   06/13/2010  Discharge Time:   6:00 PM    Psychiatrist:   Melbourne Abts, MD  Resident/NP:   Harrell Lark, NP  Primary Care Physician:   Jeffrey Holmes, MD, MD  Social Worker:   Jeffrey Reese, BSW  Other:    Additional Patient Information:  Jeffrey Reese will discharge to his parent's home at [address], [place], [place] [address].    Diagnosis at Discharge:     Axis I:  Major Depressive Disorder              Alcohol Dependence      Brief Summary of Your Hospital Course (including key procedures and diagnostic test results):  You were admitted into the hospital after an overdose of Rexall (ciontaining Benadryl) along with alcohol consumption in a suicide attempt, which resulted in a mental health arrest. During your admission you were ordered an antidepressant (Citalopram), but declined to be on it, and it was discontinued. You were visible in the milieu. Your intake and sleep were good. You denied suiciality. The treatment team recommended Inpatient CD Rehabilitation to the [facility], which you declined at present, as you desired to work on financial matters, but you did state that you would contact [facility] as needed for that level of care. Or you can call yourself as was relayed to you by the social worker. You have agreed to follow with [facility] and they can also assist you, as noted for chemical dependency needs, in addition to psychiatric follow-up. The plan is for you to be discharged to the home of your parent and for you to follow with [facility]. At no time did you attempt to harm yourself or others. It remains important that you continue to be abstinent from alcohol and you expressed interest in AA Meetings with group listings provided for you. Lastly, you agreed to call Dr.  Deatra Reese for a follow-up appointment for your antihypertensive needs.  When to call for help:    Call your psychiatric outpatient provider [facility]) if experiencing any of these symptoms: increased irritability, sleep changes, appetite changes, energy changes, thoughts to harm yourself or others, anxiety, fear, auditory or visual hallucinations.  [place] and [facility] Crisis team: (24 hours/7 days) [phone] (Out of [place]) [phone]     Recommendations  Abstinence as noted.      The following personal items were collected during your admission and were returned to you:    Belongings  Dentures: None  Vision - Corrective Lenses: None  Hearing Aid: None  Jewelry: None  Clothing: Clothing Room/Cupboard (Shoes and coat.)  Other Valuables:  (coat shoes)        Level of Outreach Indicated If Patient Fails to Attend Scheduled Mental Health Appointment: routine program follow up      Supportive Referrals: [facility] - You will resume outpatient follow up at [facility] for therapy and medication management.  If you need prescriptions, you will need an appointment with [facility], and your therapist can schedule this for you.    We are recommending inpatient chemical dependency treatment.  At this time, you are not agreeable to inpatient rehab, but if you change your mind you can  contact admissions at [phone].        Person receiving printed copy of discharge instructions: Pt  Relationship to patient: Self

## 2010-06-13 NOTE — Progress Notes (Signed)
Slept all night
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
## 2010-06-13 NOTE — Progress Notes (Signed)
 Utilization Management    Level of Care Inpatient as of the date 12/13, clinical approved by karen hansen at [facility] [phone] 12/13 thru 12/19, auth # [id number]   update 12/20       Montez Hageman, RN     Pager: [phone]

## 2010-06-13 NOTE — Progress Notes (Signed)
 The writer contacted the pt's PCP office (Dr. Deatra Canter) and left a message to update him re: pt's admission and to confirm if pt is on Metoprolol. They do not have the pt as being on the Metoprolol. However, the Clinical research associate met again though separately with the pt. He confirmed that the medication was "not from" his PCP, but from a "previous admission." He still has between "15-20 pills left" and added that he will "call Dr. Deatra Canter" for an appointment, and added that he has "not been fully compliant with taking it." The writer also printed out AA Meeting lists for the pt.

## 2010-06-13 NOTE — Progress Notes (Addendum)
 Psych Social Work Progress Note  Chart reviewed and Discussed patient with treatment team    Contact with: Patient, Writer, Family/Spouse/Partner/Informal support and Inpatient team/staff (MD,NP, Nurse, Psych Tech)    Contact type: Rounding, Treatment Planning and Telephone Contact    Purpose of Contact: Referrals and Discharge planning/Continuity of care planning    Psychosocial Risk Factors Risk Factors: Yes, Suspected Substance Abuse: Yes (daily alcohol use)    Narrative: Patient gave verbal consent for treatment team to speak with parents.  Writer called and spoke with patient's mother, Steward Drone, and step-father, [place].  They are agreeable to have patient stay with them until his house is habitable.  Parents requested patient call and speak with them again regarding compliance with treatment.  SW spoke with patient and he became irritated with this request and refused to call.  SW informed parents, and although reluctant to pick him up, they agreed to come even without patient speaking with them.  Patient insists he prefers to do this in person versus over the phone.  Treatment team reinforced patient attending inpatient chemical dependency treatment.  Patient feels he has other matters to attend to before he would consider inpatient.  SW will supply patient with Ranell Patrick contact.  Patient was agreeable to attend [facility] appointment with assigned therapist, Todd Mix.  Parents will pick up patient at 6 pm.      Next Steps: discharge; fax AVS to [facility].  SW emailed Dr. Malva Cogan to discuss work note stating admission and date of return 12/30.  Patient signed consent for SW to fax work a letter to employer at [employer].  Patient informed that letter cannot be faxed until tomorrow when Dr. Malva Cogan is on the unit.      Penelope Coop, MSW  Pager ID [phone]

## 2010-06-13 NOTE — Progress Notes (Signed)
 Pt was discharged at 1615, with belongings, accompanied by mother.  On discharge, pt. planned to attend AA meetings today, to arrange for own followup.   He was adamant that his priorities were to deal with the myriad stressors.

## 2010-06-13 NOTE — Progress Notes (Signed)
 Psych Daily Progress Note for Inpatients  Patient seen on rounds together with members of the interdisciplinary team, Dian Queen, NP and Penelope Coop, SW    CC: "I can come back to see you then go to inpatient rehab."    Subjective: Patient states he is anxious about stressors.  Open to the idea of inpatient rehab but states he needs to take care of financial issues first as they can't wait.  States he will go to daily AA.  States mother will take him in.  Denies SI.  States he was "cleared" from [facility] but states he would return.  States he knows he needs rehab and is asking how he would be referred.     Physical complaints: none    Overnight Events: Per nursing: Slept all night    Additional History (medical/social/family): None  Most recent Vitals:  Patient Vitals in the past 24 hrs:   BP Temp Temp src Pulse   06/13/10 0600 130/84 mmHg 36.2 C (97.2 F) TEMPORAL 72    06/12/10 2000 158/95 mmHg - - 62           Medications:  Scheduled Meds:     . metoprolol  50 mg Oral Q12H SCH       PRN Meds:.hydrOXYzine, acetaminophen, aluminum & magnesium hydroxide w/ simethicone, magnesium hydroxide    Psychiatric Assessments:    Mental Status Exam:    General: Slightly overweight caucasian male, casual, ruddy appearing in the face  Attitude Towards Examiner: Cooperative, pleasant, jokes  Psychomotor: within normal limits   Speech: Normal rate, volume, monotone   Mood: Okay  Affect: Constricted   Thought Process: Linear  Thought Content: Denies SI, no HI no overt delusions   Perceptual: No hallucinations   Cognitive: Alert and grossly oriented   Insight: Fair  Judgment: Fair    Pertinent Labs: No results found for this or any previous visit (from the past 24 hour(s)).       Assessment/medical decision making:   This is a 29 y.o. single, Caucasian male with alcohol dependence and possible bipolar illness who presented with overdose and destructive behavior while intoxicated in the context of stressors. Has some depressive  symptoms that may be related to situation vs. ETOH use.  Showing some improvement in insight and judgement.    Patient Active Problem List   Diagnoses Code   . Alcohol dependence with alcohol-induced mood disorder 303.90BC         Plan:  Discharge today.  Was frank in my discussion of his risk factors and risk of death if intoxicated when his judgment lapses especially with degree of stressors.  Recommended bed to bed transfer to inpatient CD however patient declined and has capacity to do so, citing financial and personal reasons.  Reconnect with [facility] number on AVS so patient can be referred from [facility] vs. Self referral.  Recommend AA.  No medications recommended at this time but could consider meds that limit cravings if patient chooses to engage in actual treatment for his alcohol dependency.   Team will confirm discharge plan and discuss recommendations with mother and confirm housing.    Author: Melbourne Abts, MD  as of: 06/13/2010  at: 11:21 AM

## 2010-06-13 NOTE — Plan of Care (Signed)
 Problem: Discharge Planning/Barriers  Goal: Safe Discharge  Intervention: (SW) Safe Housing Plan  Patient will discharge to his parents home.  SW called and confirmed with parents.    Intervention: (SW) Engage Formal D/C Support  Patient's family informed of discharge plan.    Intervention: (SW) Identify Financial Supports  Patient has active insurance.   Intervention: (SW) Follow Up Appointment  Patient has follow up at [facility] and will be provided with [facility] Inpatient to contact.    Penelope Coop, MSW  Pager ID [phone]

## 2010-06-14 NOTE — Progress Notes (Signed)
 Utilization Management    Level of Care Inpatient as of the date  06/07/10, all days approved by Donald Prose at [facility] [phone]      Montez Hageman, RN     Pager: [phone]

## 2010-06-24 ENCOUNTER — Ambulatory Visit: Payer: Self-pay | Admitting: Primary Care

## 2010-06-26 ENCOUNTER — Encounter: Payer: Self-pay | Admitting: Emergency Medicine

## 2010-06-26 ENCOUNTER — Other Ambulatory Visit: Payer: Self-pay | Admitting: Gastroenterology

## 2010-06-26 ENCOUNTER — Inpatient Hospital Stay
Admission: EM | Admit: 2010-06-26 | Disposition: A | Discharge status: Home / Self Care | Payer: Self-pay | Source: Ambulatory Visit | Attending: Pulmonary and Critical Care Medicine | Admitting: Pulmonary and Critical Care Medicine

## 2010-06-26 DIAGNOSIS — I10 Essential (primary) hypertension: Secondary | ICD-10-CM | POA: Insufficient documentation

## 2010-06-26 HISTORY — DX: Bipolar disorder, unspecified: F31.9

## 2010-06-26 LAB — URINALYSIS WITH MICROSCOPIC
Blood,UA: NEGATIVE
Blood,UA: NEGATIVE
Ketones, UA: NEGATIVE
Ketones, UA: NEGATIVE
Leuk Esterase,UA: NEGATIVE
Leuk Esterase,UA: NEGATIVE
Nitrite,UA: NEGATIVE
Nitrite,UA: NEGATIVE
Protein,UA: NEGATIVE mg/dL
Protein,UA: NEGATIVE mg/dL
RBC,UA: NONE SEEN /HPF (ref 0–2)
RBC,UA: NONE SEEN /HPF (ref 0–2)
Specific Gravity,UA: 1.003 (ref 1.002–1.030)
Specific Gravity,UA: 1.004 (ref 1.002–1.030)
WBC,UA: <1 /HPF (ref 0–5)
WBC,UA: <1 /HPF (ref 0–5)
pH,UA: 6 (ref 5.0–8.0)
pH,UA: 7 (ref 5.0–8.0)

## 2010-06-26 LAB — CK ISOENZYMES
CK: 164 U/L (ref 46–171)
Mass CKMB: 1.7 ng/mL (ref 0.0–4.9)

## 2010-06-26 LAB — CBC AND DIFFERENTIAL
Baso # K/uL: 0 THOU/uL (ref 0.0–0.1)
Basophil %: 0.4 % (ref 0.2–1.2)
Eos # K/uL: 0.2 THOU/uL (ref 0.0–0.5)
Eosinophil %: 1.7 % (ref 0.8–7.0)
Hematocrit: 52 % — ABNORMAL HIGH (ref 40–51)
Hemoglobin: 18.7 g/dL — ABNORMAL HIGH (ref 13.7–17.5)
Lymph # K/uL: 2.3 THOU/uL (ref 1.3–3.6)
Lymphocyte %: 24.5 % (ref 21.8–53.1)
MCV: 91 fL (ref 79–92)
Mono # K/uL: 0.6 THOU/uL (ref 0.3–0.8)
Monocyte %: 6.9 % (ref 5.3–12.2)
Neut # K/uL: 6.2 THOU/uL — ABNORMAL HIGH (ref 1.8–5.4)
Platelets: 283 THOU/uL (ref 150–330)
RBC: 5.7 MIL/uL (ref 4.6–6.1)
RDW: 12.9 % (ref 11.6–14.4)
Seg Neut %: 66.5 % (ref 34.0–67.9)
WBC: 9.3 THOU/uL — ABNORMAL HIGH (ref 4.2–9.1)

## 2010-06-26 LAB — PLASMA PROF 7 (ED ONLY)
Anion Gap,PL: 17 — ABNORMAL HIGH (ref 7–16)
Anion Gap,PL: 16 (ref 7–16)
Anion Gap,PL: 17 — ABNORMAL HIGH (ref 7–16)
CO2,Plasma: 22 mmol/L (ref 20–28)
CO2,Plasma: 21 mmol/L (ref 20–28)
CO2,Plasma: 20 mmol/L (ref 20–28)
Chloride,Plasma: 97 mmol/L (ref 96–108)
Chloride,Plasma: 104 mmol/L (ref 96–108)
Chloride,Plasma: 107 mmol/L (ref 96–108)
Creatinine: 0.85 mg/dL (ref 0.67–1.17)
Creatinine: 0.87 mg/dL (ref 0.67–1.17)
Creatinine: 0.95 mg/dL (ref 0.67–1.17)
GFR,Black: >59 *
GFR,Black: >59 *
GFR,Black: >59 *
GFR,Caucasian: >59 *
GFR,Caucasian: >59 *
GFR,Caucasian: >59 *
Glucose,Plasma: 88 mg/dL (ref 74–106)
Glucose,Plasma: 100 mg/dL (ref 74–106)
Glucose,Plasma: 105 mg/dL (ref 74–106)
Potassium,Plasma: 3.6 mmol/L (ref 3.4–4.7)
Potassium,Plasma: 4.3 mmol/L (ref 3.4–4.7)
Potassium,Plasma: 4.6 mmol/L (ref 3.4–4.7)
Sodium,Plasma: 136 mmol/L (ref 132–146)
Sodium,Plasma: 141 mmol/L (ref 132–146)
Sodium,Plasma: 144 mmol/L (ref 132–146)
UN,Plasma: 6 mg/dL (ref 6–20)
UN,Plasma: 5 mg/dL — ABNORMAL LOW (ref 6–20)
UN,Plasma: 5 mg/dL — ABNORMAL LOW (ref 6–20)

## 2010-06-26 LAB — SALICYLATE LEVEL
Salicylate: 28.4 mg/dL (ref 15.0–30.0)
Salicylate: 51.8 mg/dL (ref 15.0–30.0)
Salicylate: 57.6 mg/dL (ref 15.0–30.0)

## 2010-06-26 LAB — RUQ PANEL (ED ONLY)
ALT: 46 U/L (ref 0–50)
AST: 33 U/L (ref 0–50)
Albumin: 5 g/dL (ref 3.5–5.2)
Alk Phos: 89 U/L (ref 40–130)
Amylase: 61 U/L (ref 28–100)
Bilirubin,Direct: <0.2 mg/dL (ref 0.0–0.3)
Bilirubin,Total: 0.2 mg/dL (ref 0.0–1.2)
Lipase: 37 U/L (ref 13–60)
Total Protein: 7.5 g/dL (ref 6.3–7.7)

## 2010-06-26 LAB — DRUG SCREEN CHEMICAL DEPENDENCY, URINE
Amphetamine,UR: NEGATIVE
Benzodiazepinen,UR: NEGATIVE
Cocaine/Metab,UR: NEGATIVE
Opiates,UR: NEGATIVE
THC Metabolite,UR: NEGATIVE

## 2010-06-26 LAB — LACTATE, PLASMA: Lactate: 2.2 mmol/L (ref 0.5–2.2)

## 2010-06-26 LAB — PROTIME-INR
INR: 1 (ref 0.9–1.1)
Protime: 13 s (ref 11.9–14.7)

## 2010-06-26 LAB — ETHANOL: Ethanol: 194 mg/dL

## 2010-06-26 LAB — ACETAMINOPHEN LEVEL
Acetaminophen: 13 ug/mL
Acetaminophen: 20 ug/mL

## 2010-06-26 LAB — APTT: aPTT: 28 s (ref 22.3–35.3)

## 2010-06-26 MED ORDER — SODIUM CHLORIDE 0.9 % IV SOLN WRAPPED *I*
125.0000 mL/h | Status: DC
Start: 2010-06-26 — End: 2010-06-26

## 2010-06-26 MED ORDER — SODIUM CHLORIDE 0.9 % IV BOLUS *I*
1000.0000 mL | Freq: Once | Status: AC
Start: 2010-06-26 — End: 2010-06-27

## 2010-06-26 MED ORDER — SODIUM BICARBONATE 8.4 % IV SOLN *I*
200.0000 mL/h | INTRAVENOUS | Status: DC
Start: 2010-06-26 — End: 2010-06-26

## 2010-06-26 MED ORDER — ONDANSETRON HCL 2 MG/ML IV SOLN *I*
4.0000 mg | Freq: Once | INTRAMUSCULAR | Status: AC
Start: 2010-06-26 — End: 2010-06-26

## 2010-06-26 MED ORDER — ONDANSETRON HCL 2 MG/ML IV SOLN *I*
4.0000 mg | Freq: Once | INTRAMUSCULAR | Status: AC
Start: 2010-06-26 — End: 2010-06-27
  Filled 2010-06-26: qty 2

## 2010-06-26 MED ORDER — CHARCOAL ACTIVATED PO LIQD *I*
ORAL | Status: AC
Start: 2010-06-26 — End: 2010-06-26
  Filled 2010-06-26: qty 240

## 2010-06-26 MED ORDER — SODIUM CHLORIDE 0.9 % IV BOLUS *I*
1000.0000 mL | Freq: Once | Status: AC
Start: 2010-06-26 — End: 2010-06-26

## 2010-06-26 MED ORDER — ONDANSETRON HCL 2 MG/ML IV SOLN *I*
INTRAMUSCULAR | Status: AC
Start: 2010-06-26 — End: 2010-06-26
  Filled 2010-06-26: qty 2

## 2010-06-26 MED ORDER — SODIUM BICARBONATE 8.4 % IV SOLN *I*
250.0000 mL/h | INTRAVENOUS | Status: DC
Start: 2010-06-26 — End: 2010-06-27
  Administered 2010-06-27: 250 mL/h via INTRAVENOUS
  Administered 2010-06-27 (×3): 200 mL/h via INTRAVENOUS
  Administered 2010-06-27: 250 mL/h via INTRAVENOUS
  Administered 2010-06-27: 200 mL/h via INTRAVENOUS
  Administered 2010-06-27: 250 mL/h via INTRAVENOUS
  Administered 2010-06-27 (×4): 200 mL/h via INTRAVENOUS
  Administered 2010-06-27: 250 mL/h via INTRAVENOUS
  Filled 2010-06-26 (×10): qty 150

## 2010-06-26 MED ORDER — CHARCOAL ACTIVATED PO LIQD *I*
50.0000 g | Freq: Once | ORAL | Status: AC
Start: 2010-06-26 — End: 2010-06-26

## 2010-06-26 MED ADMIN — Sodium Chloride IV Soln 0.9%: 1000 mL | INTRAVENOUS | NDC 00338004904

## 2010-06-26 MED ADMIN — Charcoal Activated Liq: 50 g | ORAL | NDC 00574012176

## 2010-06-26 MED ADMIN — Ondansetron HCl Inj 4 MG/2ML (2 MG/ML): 4 mg | INTRAVENOUS | NDC 00409475503

## 2010-06-26 MED ADMIN — Sodium Chloride IV Soln 0.9%: 125 mL/h | INTRAVENOUS | NDC 00338004904

## 2010-06-26 NOTE — ED Notes (Signed)
 Pt drinking charcoal, is profusely diaphoretic.  Will monitor closely.

## 2010-06-26 NOTE — ED Notes (Signed)
EKG done in room.

## 2010-06-26 NOTE — ED Notes (Addendum)
 Pt admits to etoh use today, will not put an amount on total daily use. Denies any illegal drug use at this time.  Admits to psychiatric/depression hx but does not take any meds for or see a therapist for.  Pt smokes one pack a day but declines offer of a patch at this time.  Denies SI or HI.  States he has family at home.  Will monitor closely and tx per MD order.  Pt also admits to having a "head cold for past few days" and took meds "just to feel better"

## 2010-06-26 NOTE — ED Notes (Signed)
 Pt returned to trauma bay per MD order.  Meds given per MD order, labs drawn and foley placed per MD order.  Pt placed on telemetry.

## 2010-06-26 NOTE — Consults (Signed)
 Medical Toxicology --Brief Note from Phone Conversation with EM Resident    30 year-old M with large aspirin ingestion (bottle of 100 tabs 325 mg ASA), Nyquil, Sudafed.  EtOH.  TOI was earlier this afternoon.  Patient has become tachycardic and hypertensive and is vomiting.  ASA level has increased from 20's mg/dL at 1191 to 47.8 mg/dL at 2956.  Patient's girlfriend also missing 10 quetiapine tabs.    Recent Results (from the past 24 hour(s))   CBC AND DIFFERENTIAL    Collection Time    06/26/10  5:55 PM   Component Value Range   . WBC 9.3 (*) 4.2-9.1 (THOU/uL)   . RBC 5.7  4.6-6.1 (MIL/uL)   . Hemoglobin 18.7 (*) 13.7-17.5 (g/dL)   . Hematocrit 52 (*) 40-51 (%)   . MCV 91  79-92 (fL)   . RDW 12.9  11.6-14.4 (%)   . Platelets 283  150-330 (THOU/uL)   . Seg Neut % 66.5  34.0-67.9 (%)   . Lymphocyte % 24.5  21.8-53.1 (%)   . Monocyte % 6.9  5.3-12.2 (%)   . Eosinophil % 1.7  0.8-7.0 (%)   . Basophil % 0.4  0.2-1.2 (%)   . Neut # K/uL 6.2 (*) 1.8-5.4 (THOU/uL)   . Lymph # K/uL 2.3  1.3-3.6 (THOU/uL)   . Mono # K/uL 0.6  0.3-0.8 (THOU/uL)   . Eos # K/uL 0.2  0.0-0.5 (THOU/uL)   . Baso # K/uL 0.0  0.0-0.1 (THOU/uL)   PLASMA PROF 7 [facility] ED ONLY)    Collection Time    06/26/10  5:55 PM   Component Value Range   . Chloride,Plasma 97  96-108 (mmol/L)   . CO2,Plasma 22  20-28 (mmol/L)   . Potassium,Plasma 3.6  3.4-4.7 (mmol/L)   . Sodium,Plasma 136  132-146 (mmol/L)   . Anion Gap 17 (*) 7-16    . UN,Plasma 6  6-20 (mg/dL)   . Creatinine,Plasma 0.85  0.67-1.17 (mg/dL)   . GFR,CAUCASIAN > 59  (*)   . GFR,BLACK > 59  (*)   . Glucose, Plasma 88  74-106 (mg/dL)   RUQ PANEL    Collection Time    06/26/10  5:55 PM   Component Value Range   . Amylase 61  28-100 (U/L)   . Lipase 37  13-60 (U/L)   . Total Protein 7.5  6.3-7.7 (g/dL)   . Albumin 5.0  3.5-5.2 (g/dL)   . Bilirubin, Total 0.2  0.0-1.2 (mg/dL)   . Bilirubin, Direct < 0.2  0.0-0.3 (mg/dL)   . Alk Phos 89  40-130 (U/L)   . AST 33  0-50 (U/L)   . ALT 46  0-50 (U/L)   APTT     Collection Time    06/26/10  5:55 PM   Component Value Range   . aPTT 28.0  22.3-35.3 (sec)   PROTIME-INR    Collection Time    06/26/10  5:55 PM   Component Value Range   . Protime 13.0  11.9-14.7 (sec)   . INR 1.0  0.9-1.1    ACETAMINOPHEN LEVEL    Collection Time    06/26/10  5:55 PM   Component Value Range   . Acetaminophen (Tylenol), Serum 13  (ug/mL)   SALICYLATE LEVEL    Collection Time    06/26/10  5:55 PM   Component Value Range   . Salicylate Lvl 28.4  15.0-30.0 (mg/dL)   ETHANOL    Collection Time    06/26/10  5:55 PM  Component Value Range   . Ethanol 194  (mg/dL)   DRUG SCREEN CHEMICAL DEPENDENCY, URINE    Collection Time    06/26/10  5:55 PM   Component Value Range   . Amphetamine Screen, Ur NEG     . Cocaine Metabolites, Ur NEG     . Benzodiazepine Screen, Urine NEG     . Opiate Screen, Urine NEG     . Marijuana Metabolite NEG     . Remark,UR see text     CK ISOENZYMES    Collection Time    06/26/10  5:55 PM   Component Value Range   . CK 164  46-171 (U/L)   . Mass CKMB 1.7  0.0-4.9 (ng/mL)   LACTATE, PLASMA    Collection Time    06/26/10  5:55 PM   Component Value Range   . Lactate 2.2  0.5-2.2 (mmol/L)   URINALYSIS WITH MICROSCOPIC    Collection Time    06/26/10  5:55 PM   Component Value Range   . Color, UA Colorless  Yellow    . Appearance,UR Clear  Clear    . Specific Gravity, UA 1.003  1.002-1.030    . Leuk Esterase,UA NEG  NEGATIVE    . Nitrite, UA NEG  NEGATIVE    . pH,UA 6.0  5.0-8.0    . Protein, UA NEG  NEGATIVE (mg/dL)   . Glucose,UA NORM  (mg/dL)   . Ketones, UA NEG  NEGATIVE    . Blood, UA NEG  NEGATIVE    . RBC, UA None Seen  0-2 (/hpf)   . WBC, UA <1  0-5 (/hpf)   PLASMA PROF 7 [facility] ED ONLY)    Collection Time    06/26/10  8:24 PM   Component Value Range   . Chloride,Plasma 104  96-108 (mmol/L)   . CO2,Plasma 21  20-28 (mmol/L)   . Potassium,Plasma 4.3  3.4-4.7 (mmol/L)   . Sodium,Plasma 141  132-146 (mmol/L)   . Anion Gap 16  7-16    . UN,Plasma 5 (*) 6-20 (mg/dL)   . Creatinine,Plasma 0.87  0.67-1.17  (mg/dL)   . GFR,CAUCASIAN > 59  (*)   . GFR,BLACK > 59  (*)   . Glucose, Plasma 100  74-106 (mg/dL)   SALICYLATE LEVEL    Collection Time    06/26/10  8:24 PM   Component Value Range   . Salicylate Lvl 51.8 (*) 15.0-30.0 (mg/dL)   ACETAMINOPHEN LEVEL    Collection Time    06/26/10  8:24 PM   Component Value Range   . Acetaminophen (Tylenol), Serum 20  (ug/mL)           Assessment:   30 year-old M with large aspirin ingestion.  With 100 tabs of 325 mg ASA a 80 kg individual, if all the aspirin is absorbed, could get well into the 100's mg/dL which is a life threatening ingesting.  Absorption is often delayed in aspirin ingestions due to large amount of pills, bezoar, delayed gastric emptying, etc.      Sudafed has either phenylephrine or pseudoephedrine in it and will cause sympathomimetic intoxication.    Nyquil, depending on specific type, will contain apap (patient's level 20 mg/L), some EtOH, dextromethorphan.    Patient at risk for life-threatening aspirin toxicity --hyperthermia, seizures, acidosis --as level rises.  Level of 51.8 mg/dL acutely starts to cause acidosis, tinnitus, and if it persists up to 24 hours ASA of 50 mg/dL becomes life-threatening.    Recommendations:  1.) IVF, alkalinize urine by using D5W with 3 amps bicarb.  Goal is urine pH 8.  Monitor K and keep at 4 if possible.  Start K replacement with alkalinization.  Start at rate of 200 mL/hour and check lytes, UA, ABG within 1 hour of start in this patient and adjust fluids, K, rate as needed for optimal urine pH 8, serum pH 7.45-7.50 and K 4.0    2.) Benzodiazepines for agitation and any rigidity (tremors, hyperreflexia, clonus, etc.) as well as to treat the sympathomimetic toxicity.  Avoid any neuroleptics in this patient.  If seizures occur treat with benzodiazepines,      3.) repeat salicylate level now and then q 2 hours until it has stopped climbing.  If the salicylate level is >80 mg/dL then call Nephrology for urgent consult and dialysis.   If salicylate level is >100 mg/dL then dialysis is absolutely needed.    4.) Put foley in and monitor core temperature --rising temperature, acidosis, seizures, encephalopathy are signs progressive and severe asa toxicity.      5.)  Give 50 grams AC x 1 now if at all possible.  Use Zofran and put NGT down if possible for [facility] delivery.    6.) Repeat apap level with next ASA level and use nomogram again --due to large amount of pills ingested absorption for all may be delayed/off.  Also get chemistry and ABG with stat ASA level now.    7.) EKG.  Monitor QT interval (Seroquel can cause QT prolongation).  If QT prolongation replace K and give 2 grams magnesium sulfate (check mag if not already obtained).    Call Toxicology at [phone] on 06/26/2009 and into 06/27/2009 --Tox Consult cell temporarily unavailable.    Tox will f/u with bedside consult in AM and is available for f/u calls or questions overnight at [phone].

## 2010-06-26 NOTE — ED Notes (Signed)
 Assumed care of pt at 2000. Pt states last night he had ETOH and took some pills because he was depressed. When asked how much ETOH he states "not enough". And when asked what pills he states "some aspirin, nyquil".  Pt sitting up in bed, cooperative. Will monitor and treat per orders.

## 2010-06-26 NOTE — ED Notes (Signed)
 Patient MHA'd.  Possible overdose of 99 ASA, 12 Nyquil & 48 Sudafed.  Patient denies but, is sleepy.  Girlfriend also stated she is missing 10 Seroquel.  [place] [employer] found a receipt in patient's kitchen which showed that these meds were bought today @ 1248h & all the bottles are now empty.  +ETOH.

## 2010-06-26 NOTE — ED Notes (Addendum)
 Awake and talking, ED7 stable, given 1 L NS. Acetaminophen of 13, salicylate level in 20s, EtOH 194. Will repeat levels at 2030 - pt states medications taken around 1300-1400. Will allow pt to sober, re-eval and will need psychiatric evaluation.    Irven Shelling, MD  06/26/10 [phone]

## 2010-06-26 NOTE — ED Notes (Signed)
 EKG completed in room

## 2010-06-26 NOTE — ED Notes (Signed)
 The patient had a second ASA level that was elevated, was having symptoms consistent with ASA toxicity.  Thus, toxicology was consulted and the interventions described in their note were implemented.  Due to the concerns, the patient was admitted to the MICU.    Irven Shelling, MD  06/26/10 [phone]
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
## 2010-06-26 NOTE — ED Notes (Signed)
 Report given to RN Trippi transferring care at this time

## 2010-06-26 NOTE — ED Notes (Signed)
 Pt arrived via ambulance to [facility], per EMS and pt he allegedly took 74 Sudafed, 99 ASA and 12 Nyquil. When asked what time incident happened pt states "It is all about perception" when asked what that meant he states "Well when you tell some something all the time they will eventually believe you so it is all about perception" When asked by MD if he was lying about meds consumed he states "yes". However, when author entered room again pt asked "how long does it take for someone to go to sleep after taking a lot of medicine?" Asked by Thereasa Parkin did you take any meds "i already told you I took all the medication". Pt on cardiac monitor. No obvious signs of distress, labs sent, placed in green socks, green band and security notified. Will continue to monitor and treat per provider orders

## 2010-06-27 LAB — BASIC METABOLIC PANEL
Anion Gap: 19 — ABNORMAL HIGH (ref 7–16)
Anion Gap: 17 — ABNORMAL HIGH (ref 7–16)
Anion Gap: 16 (ref 7–16)
Anion Gap: 19 — ABNORMAL HIGH (ref 7–16)
Anion Gap: 15 (ref 7–16)
Anion Gap: 14 (ref 7–16)
Anion Gap: 8 (ref 7–16)
CO2: 20 mmol/L (ref 20–28)
CO2: 20 mmol/L (ref 20–28)
CO2: 25 mmol/L (ref 20–28)
CO2: 23 mmol/L (ref 20–28)
CO2: 26 mmol/L (ref 20–28)
CO2: 24 mmol/L (ref 20–28)
CO2: 29 mmol/L — ABNORMAL HIGH (ref 20–28)
Calcium: 7.9 mg/dL — ABNORMAL LOW (ref 9.0–10.3)
Calcium: 8.2 mg/dL — ABNORMAL LOW (ref 9.0–10.3)
Calcium: 7.9 mg/dL — ABNORMAL LOW (ref 9.0–10.3)
Calcium: 7.9 mg/dL — ABNORMAL LOW (ref 9.0–10.3)
Calcium: 8 mg/dL — ABNORMAL LOW (ref 9.0–10.3)
Calcium: 8.1 mg/dL — ABNORMAL LOW (ref 9.0–10.3)
Calcium: 7.7 mg/dL — ABNORMAL LOW (ref 9.0–10.3)
Chloride: 106 mmol/L (ref 96–108)
Chloride: 106 mmol/L (ref 96–108)
Chloride: 102 mmol/L (ref 96–108)
Chloride: 100 mmol/L (ref 96–108)
Chloride: 100 mmol/L (ref 96–108)
Chloride: 99 mmol/L (ref 96–108)
Chloride: 104 mmol/L (ref 96–108)
Creatinine: 0.83 mg/dL (ref 0.67–1.17)
Creatinine: 0.9 mg/dL (ref 0.67–1.17)
Creatinine: 1.15 mg/dL (ref 0.67–1.17)
Creatinine: 1.43 mg/dL — ABNORMAL HIGH (ref 0.67–1.17)
Creatinine: 1.36 mg/dL — ABNORMAL HIGH (ref 0.67–1.17)
Creatinine: 1.27 mg/dL — ABNORMAL HIGH (ref 0.67–1.17)
Creatinine: 1.23 mg/dL — ABNORMAL HIGH (ref 0.67–1.17)
GFR,Black: >59 *
GFR,Black: >59 *
GFR,Black: >59 *
GFR,Black: >59 *
GFR,Black: >59 *
GFR,Black: >59 *
GFR,Black: >59 *
GFR,Caucasian: >59 *
GFR,Caucasian: >59 *
GFR,Caucasian: >59 *
GFR,Caucasian: 58 * — AB
GFR,Caucasian: >59 *
GFR,Caucasian: >59 *
GFR,Caucasian: >59 *
Glucose: 140 mg/dL — ABNORMAL HIGH (ref 74–106)
Glucose: 126 mg/dL — ABNORMAL HIGH (ref 74–106)
Glucose: 135 mg/dL — ABNORMAL HIGH (ref 74–106)
Glucose: 131 mg/dL — ABNORMAL HIGH (ref 74–106)
Glucose: 155 mg/dL — ABNORMAL HIGH (ref 74–106)
Glucose: 110 mg/dL — ABNORMAL HIGH (ref 74–106)
Glucose: 108 mg/dL — ABNORMAL HIGH (ref 74–106)
Lab: 7 mg/dL (ref 6–20)
Lab: 7 mg/dL (ref 6–20)
Lab: 8 mg/dL (ref 6–20)
Lab: 10 mg/dL (ref 6–20)
Lab: 10 mg/dL (ref 6–20)
Lab: 9 mg/dL (ref 6–20)
Lab: 7 mg/dL (ref 6–20)
Potassium: 4 mmol/L (ref 3.3–5.1)
Potassium: 3.3 mmol/L (ref 3.3–5.1)
Potassium: 3.3 mmol/L (ref 3.3–5.1)
Potassium: 3.4 mmol/L (ref 3.3–5.1)
Potassium: 3.1 mmol/L — ABNORMAL LOW (ref 3.3–5.1)
Potassium: 3.1 mmol/L — ABNORMAL LOW (ref 3.3–5.1)
Potassium: 2.7 mmol/L — CL (ref 3.3–5.1)
Sodium: 145 mmol/L (ref 133–145)
Sodium: 143 mmol/L (ref 133–145)
Sodium: 143 mmol/L (ref 133–145)
Sodium: 142 mmol/L (ref 133–145)
Sodium: 141 mmol/L (ref 133–145)
Sodium: 137 mmol/L (ref 133–145)
Sodium: 141 mmol/L (ref 133–145)

## 2010-06-27 LAB — PH,URINE
pH,UA: 7.5 (ref 5.0–8.0)
pH,UA: 6 (ref 5.0–8.0)
pH,UA: 5.5 (ref 5.0–8.0)
pH,UA: 8 (ref 5.0–8.0)
pH,UA: 7.5 (ref 5.0–8.0)
pH,UA: 7 (ref 5.0–8.0)

## 2010-06-27 LAB — BLOOD GAS, ARTERIAL
Base Excess, Arterial: -3 mmol/L (ref ?–3)
Base Excess, Arterial: -1 mmol/L (ref ?–3)
Base Excess, Arterial: -2 mmol/L (ref ?–3)
Base Excess, Arterial: 6 mmol/L — ABNORMAL HIGH (ref ?–3)
Base Excess, Arterial: 4 mmol/L — ABNORMAL HIGH (ref ?–3)
Base Excess, Arterial: 3 mmol/L — ABNORMAL HIGH (ref ?–3)
Base Excess, Arterial: 5 mmol/L — ABNORMAL HIGH (ref ?–3)
Base Excess, Arterial: 6 mmol/L — ABNORMAL HIGH (ref ?–3)
CO2,ART (Calc): 21 mmol/L (ref 21–28)
CO2,ART (Calc): 22 mmol/L (ref 21–28)
CO2,ART (Calc): 21 mmol/L (ref 21–28)
CO2,ART (Calc): 28 mmol/L (ref 21–28)
CO2,ART (Calc): 26 mmol/L (ref 21–28)
CO2,ART (Calc): 25 mmol/L (ref 21–28)
CO2,ART (Calc): 27 mmol/L (ref 21–28)
CO2,ART (Calc): 31 mmol/L — ABNORMAL HIGH (ref 21–28)
CO: 3.5 %
CO: 2.1 %
CO: 1.4 %
CO: 2.2 %
CO: 0.8 %
CO: 0.1 %
CO: 1.4 %
CO: 0.3 %
FO2 Hb, Arterial: 93 % (ref 90–95)
FO2 Hb, Arterial: 94 % (ref 90–95)
FO2 Hb, Arterial: 95 % (ref 90–95)
FO2 Hb, Arterial: 95 % (ref 90–95)
FO2 Hb, Arterial: 96 % — ABNORMAL HIGH (ref 90–95)
FO2 Hb, Arterial: 97 % — ABNORMAL HIGH (ref 90–95)
FO2 Hb, Arterial: 97 % — ABNORMAL HIGH (ref 90–95)
FO2 Hb, Arterial: 94 % (ref 90–95)
HCO3, Arterial: 20 mmol/L (ref 19–23)
HCO3, Arterial: 21 mmol/L (ref 19–23)
HCO3, Arterial: 20 mmol/L (ref 19–23)
HCO3, Arterial: 27 mmol/L — ABNORMAL HIGH (ref 19–23)
HCO3, Arterial: 25 mmol/L — ABNORMAL HIGH (ref 19–23)
HCO3, Arterial: 24 mmol/L — ABNORMAL HIGH (ref 19–23)
HCO3, Arterial: 26 mmol/L — ABNORMAL HIGH (ref 19–23)
HCO3, Arterial: 30 mmol/L — ABNORMAL HIGH (ref 19–23)
Hemoglobin: 18.6 g/dL — ABNORMAL HIGH (ref 13.7–17.5)
Hemoglobin: 17.9 g/dL — ABNORMAL HIGH (ref 13.7–17.5)
Hemoglobin: 18 g/dL — ABNORMAL HIGH (ref 13.7–17.5)
Hemoglobin: 17.5 g/dL (ref 13.7–17.5)
Hemoglobin: 17.5 g/dL (ref 13.7–17.5)
Hemoglobin: 18.3 g/dL — ABNORMAL HIGH (ref 13.7–17.5)
Hemoglobin: 16.6 g/dL (ref 13.7–17.5)
Hemoglobin: 16.1 g/dL (ref 13.7–17.5)
Methemoglobin: 0.6 % (ref 0.0–1.0)
Methemoglobin: 0.4 % (ref 0.0–1.0)
Methemoglobin: 0.4 % (ref 0.0–1.0)
Methemoglobin: 0.2 % (ref 0.0–1.0)
Methemoglobin: 0.4 % (ref 0.0–1.0)
Methemoglobin: 0.5 % (ref 0.0–1.0)
Methemoglobin: 0.1 % (ref 0.0–1.0)
Methemoglobin: 0.3 % (ref 0.0–1.0)
pCO2, Arterial: 29 mmHg — ABNORMAL LOW (ref 36–46)
pCO2, Arterial: 30 mmHg — ABNORMAL LOW (ref 36–46)
pCO2, Arterial: 29 mmHg — ABNORMAL LOW (ref 36–46)
pCO2, Arterial: 30 mmHg — ABNORMAL LOW (ref 36–46)
pCO2, Arterial: 30 mmHg — ABNORMAL LOW (ref 36–46)
pCO2, Arterial: 28 mmHg — ABNORMAL LOW (ref 36–46)
pCO2, Arterial: 30 mmHg — ABNORMAL LOW (ref 36–46)
pCO2, Arterial: 38 mmHg (ref 36–46)
pH: 7.45 — ABNORMAL HIGH (ref 7.35–7.43)
pH: 7.46 — ABNORMAL HIGH (ref 7.35–7.43)
pH: 7.46 — ABNORMAL HIGH (ref 7.35–7.43)
pH: 7.57 — ABNORMAL HIGH (ref 7.35–7.43)
pH: 7.55 — ABNORMAL HIGH (ref 7.35–7.43)
pH: 7.56 — ABNORMAL HIGH (ref 7.35–7.43)
pH: 7.56 — ABNORMAL HIGH (ref 7.35–7.43)
pH: 7.51 — ABNORMAL HIGH (ref 7.35–7.43)
pO2,Arterial: 90 mmHg (ref 80–100)
pO2,Arterial: 84 mmHg (ref 80–100)
pO2,Arterial: 87 mmHg (ref 80–100)
pO2,Arterial: 85 mmHg (ref 80–100)
pO2,Arterial: 78 mmHg — ABNORMAL LOW (ref 80–100)
pO2,Arterial: 81 mmHg (ref 80–100)
pO2,Arterial: 103 mmHg — ABNORMAL HIGH (ref 80–100)
pO2,Arterial: 70 mmHg — ABNORMAL LOW (ref 80–100)

## 2010-06-27 LAB — SALICYLATE LEVEL
Salicylate: 56.2 mg/dL (ref 15.0–30.0)
Salicylate: 58.7 mg/dL (ref 15.0–30.0)
Salicylate: 54.1 mg/dL (ref 15.0–30.0)
Salicylate: 52.5 mg/dL (ref 15.0–30.0)
Salicylate: 48.7 mg/dL — ABNORMAL HIGH (ref 15.0–30.0)
Salicylate: 47.3 mg/dL — ABNORMAL HIGH (ref 15.0–30.0)
Salicylate: 35.6 mg/dL — ABNORMAL HIGH (ref 15.0–30.0)
Salicylate: 21.7 mg/dL (ref 15.0–30.0)

## 2010-06-27 LAB — HEPATIC FUNCTION PANEL
ALT: 40 U/L (ref 0–50)
ALT: 33 U/L (ref 0–50)
AST: 20 U/L (ref 0–50)
AST: 24 U/L (ref 0–50)
Albumin: 4.5 g/dL (ref 3.5–5.2)
Albumin: 4.1 g/dL (ref 3.5–5.2)
Alk Phos: 77 U/L (ref 40–130)
Alk Phos: 73 U/L (ref 40–130)
Bilirubin,Direct: <0.2 mg/dL (ref 0.0–0.3)
Bilirubin,Direct: <0.2 mg/dL (ref 0.0–0.3)
Bilirubin,Total: 0.1 mg/dL (ref 0.0–1.2)
Bilirubin,Total: 0.1 mg/dL (ref 0.0–1.2)
Total Protein: 6.9 g/dL (ref 6.3–7.7)
Total Protein: 6.4 g/dL (ref 6.3–7.7)

## 2010-06-27 LAB — CBC
Hematocrit: 48 % (ref 40–51)
Hemoglobin: 16.8 g/dL (ref 13.7–17.5)
MCV: 90 fL (ref 79–92)
Platelets: 301 THOU/uL (ref 150–330)
RBC: 5.3 MIL/uL (ref 4.6–6.1)
RDW: 13 % (ref 11.6–14.4)
WBC: 10.5 THOU/uL — ABNORMAL HIGH (ref 4.2–9.1)

## 2010-06-27 LAB — MAGNESIUM
Magnesium: 2.1 meq/L (ref 1.3–2.1)
Magnesium: 1.9 meq/L (ref 1.3–2.1)
Magnesium: 1.9 meq/L (ref 1.3–2.1)
Magnesium: 2 meq/L (ref 1.3–2.1)
Magnesium: 1.9 meq/L (ref 1.3–2.1)

## 2010-06-27 LAB — ACETAMINOPHEN LEVEL: Acetaminophen: 7 ug/mL

## 2010-06-27 LAB — PHOSPHORUS
Phosphorus: 4.2 mg/dL (ref 2.7–4.5)
Phosphorus: 3.7 mg/dL (ref 2.7–4.5)
Phosphorus: 3.4 mg/dL (ref 2.7–4.5)
Phosphorus: 2.7 mg/dL (ref 2.7–4.5)
Phosphorus: 3.1 mg/dL (ref 2.7–4.5)

## 2010-06-27 LAB — PROTIME-INR
INR: 1.2 — ABNORMAL HIGH (ref 0.9–1.1)
Protime: 15.6 s — ABNORMAL HIGH (ref 11.9–14.7)

## 2010-06-27 MED ORDER — THIAMINE HCL 100 MG PO TABS *WRAPPED*
100.0000 mg | ORAL_TABLET | Freq: Every day | ORAL | Status: DC
Start: 2010-06-27 — End: 2010-06-30
  Administered 2010-06-27 – 2010-06-30 (×4): 100 mg via ORAL
  Filled 2010-06-27 (×5): qty 1

## 2010-06-27 MED ORDER — FENTANYL CITRATE 50 MCG/ML IJ SOLN *WRAPPED*
INTRAMUSCULAR | Status: AC
Start: 2010-06-27 — End: 2010-06-27
  Filled 2010-06-27: qty 2

## 2010-06-27 MED ORDER — DIAZEPAM 10 MG PO TABS *I*
20.0000 mg | ORAL_TABLET | ORAL | Status: DC | PRN
Start: 2010-06-27 — End: 2010-06-29
  Administered 2010-06-29: 20 mg via ORAL
  Filled 2010-06-27 (×2): qty 2

## 2010-06-27 MED ORDER — MULTIVITAMINS PO TABS *A*
1.0000 | ORAL_TABLET | Freq: Every day | ORAL | Status: DC
Start: 2010-06-27 — End: 2010-06-30
  Administered 2010-06-27 – 2010-06-30 (×4): 1 via ORAL
  Filled 2010-06-27 (×4): qty 1

## 2010-06-27 MED ORDER — DEXTROSE 5 % IV SOLN WRAPPED *I*
10000.0000 mg | Freq: Once | INTRAVENOUS | Status: AC
Start: 2010-06-27 — End: 2010-06-27
  Administered 2010-06-27: 10000 mg via INTRAVENOUS
  Filled 2010-06-27: qty 50

## 2010-06-27 MED ORDER — FENTANYL CITRATE 50 MCG/ML IJ SOLN *WRAPPED*
50.0000 ug | Freq: Once | INTRAMUSCULAR | Status: AC
Start: 2010-06-27 — End: 2010-06-27
  Administered 2010-06-27: 50 ug via INTRAVENOUS

## 2010-06-27 MED ORDER — DEXTROSE 5 % IV SOLN WRAPPED *I*
5000.0000 mg | Freq: Once | INTRAVENOUS | Status: AC
Start: 2010-06-27 — End: 2010-06-27
  Administered 2010-06-27: 5000 mg via INTRAVENOUS
  Filled 2010-06-27: qty 25

## 2010-06-27 MED ORDER — SODIUM BICARBONATE 8.4 % IV SOLN *I*
150.0000 mL/h | INTRAVENOUS | Status: DC
Start: 2010-06-27 — End: 2010-06-28
  Administered 2010-06-27 – 2010-06-28 (×21): 150 mL/h via INTRAVENOUS
  Filled 2010-06-27 (×7): qty 150

## 2010-06-27 MED ORDER — LORAZEPAM 2 MG/ML IJ SOLN *I*
4.0000 mg | INTRAMUSCULAR | Status: DC | PRN
Start: 2010-06-27 — End: 2010-06-28

## 2010-06-27 MED ORDER — SODIUM BICARBONATE 8.4 % IV SOLN *I*
100.0000 meq | Freq: Once | INTRAVENOUS | Status: AC
Start: 2010-06-27 — End: 2010-06-27
  Administered 2010-06-27: 100 meq via INTRAVENOUS
  Filled 2010-06-27 (×2): qty 50

## 2010-06-27 MED ORDER — DEXTROSE 5 % IV SOLN WRAPPED *I*
15000.0000 mg | Freq: Once | INTRAVENOUS | Status: AC
Start: 2010-06-27 — End: 2010-06-27
  Administered 2010-06-27: 15000 mg via INTRAVENOUS
  Filled 2010-06-27: qty 75

## 2010-06-27 MED ORDER — POTASSIUM CHLORIDE CRYS CR 10 MEQ PO TBCR *I*
40.0000 meq | ORAL_TABLET | Freq: Two times a day (BID) | ORAL | Status: DC
Start: 2010-06-27 — End: 2010-06-30
  Administered 2010-06-27 – 2010-06-30 (×6): 40 meq via ORAL
  Filled 2010-06-27: qty 4
  Filled 2010-06-27: qty 2
  Filled 2010-06-27: qty 4
  Filled 2010-06-27: qty 1
  Filled 2010-06-27: qty 2
  Filled 2010-06-27: qty 4
  Filled 2010-06-27: qty 2

## 2010-06-27 MED ORDER — DALTEPARIN SODIUM 5000 UNIT/0.2ML SC SOSY *I*
5000.0000 [IU] | PREFILLED_SYRINGE | SUBCUTANEOUS | Status: DC
Start: 2010-06-27 — End: 2010-06-30
  Administered 2010-06-27 – 2010-06-29 (×3): 5000 [IU] via SUBCUTANEOUS
  Filled 2010-06-27 (×4): qty 0.2

## 2010-06-27 MED ORDER — FOLIC ACID 1 MG PO TABS *I*
1.0000 mg | ORAL_TABLET | Freq: Every day | ORAL | Status: DC
Start: 2010-06-27 — End: 2010-06-30
  Administered 2010-06-27 – 2010-06-30 (×4): 1 mg via ORAL
  Filled 2010-06-27 (×4): qty 1

## 2010-06-27 MED ADMIN — Ondansetron HCl Inj 4 MG/2ML (2 MG/ML): 4 mg | INTRAVENOUS | NDC 00409475503

## 2010-06-27 MED ADMIN — Fentanyl Citrate Preservative Free (PF) Inj 100 MCG/2ML: 0 ug | NDC 10019003867

## 2010-06-27 NOTE — Consults (Addendum)
 Nephrology Fellow consult 06/27/2010 9:06 AM  Reason for consultation: Salicylate intoxication.  Request provider: Loren Racer, MD  Rourke Mcquitty is a 30 y.o. male with h/o Alcoholism, Hypertension, and Bipolar disorder presented to [facility] on 06/26/2010 after overdose of 99 pills of aspirin, 10 pills of Nyquil and 45 pills of Sudafed yesterday afternoon. Pt notes to have "mouthful ears", nausea and vomiting on arrival resolved. Pt denies CP, SOB, N/V, visual changes at the present. He was given IV HCO3, with saline bolus to alkalinize urine. Last Urine pH dropped from 7.5 to 6.0. NAC given per toxicology.    Past Medical History   Diagnosis Date   . Alcoholism    . Hypertension    . Bipolar disorder        No past surgical history on file.       Marland Kitchen acetylcysteine (ACETADOTE) infusion *loading dose*  15,000 mg Intravenous Once   . acetylcysteine (ACETADOTE) infusion *second dose*  5,000 mg Intravenous Once   . acetylcysteine (ACETADOTE) infusion *third dose*  10,000 mg Intravenous Once   . fentanyl       . fentanyl  50 mcg Intravenous Once   . sodium bicarbonate  100 mEq Intravenous Once   . sodium chloride 0.9 %  1,000 mL Intravenous Once   . ondansetron  4 mg Intravenous Once   . sodium chloride 0.9 %  1,000 mL Intravenous Once   . ondansetron  4 mg Intravenous Once   . charcoal activated  50 g Oral Once        Allergy  Allergies   Allergen Reactions   . Zoloft        History   Social History   . Marital Status: Single     Spouse Name: N/A     Number of Children: N/A   . Years of Education: N/A   Occupational History   . Not on file.   Social History Main Topics   . Smoking status: Current Everyday Smoker -- 0.5 packs/day     Types: Cigarettes   . Smokeless tobacco: Not on file   . Alcohol Use: Yes      >21   . Drug Use: No   . Sexually Active:    Other Topics Concern   . Not on file   Social History Narrative   . No narrative on file       History reviewed.  No pertinent family history.  Review of System: Except  mention in HPI, the remainder of the 10 point ROS was negative.  (-) fever, chills   (-) Nausea, vomiting   (-) cough, colds   (-) chest pain, SOB   (-) diarhhea   (-) RLQ pain   (-) rash   Prior to Admission medications    Not on File      Physical examination:  BP: (129-164)/(73-116)   Temp:  [36.2 C (97.2 F)-36.8 C (98.2 F)]   Temp src:  [-]   Heart Rate:  [85-123]   Resp:  [12-24]   SpO2:  [97 %-99 %]   Height:  [177.8 cm (5\' 10" )]   Weight:  [102.059 kg (225 lb)]     Intake/Output Summary (Last 24 hours) at 06/27/10 0906  Last data filed at 06/27/10 0759   Gross per 24 hour   Intake 2893.83 ml   Output   2515 ml   Net 378.83 ml       I/O last 3 completed shifts:  In: 2348 [  P.O.:240; I.V.:1290; IV Piggyback:818]  Out: 2325 [Urine:2325]  I/O this shift:  In: 545.8 [P.O.:240; I.V.:205.8; Other:100]  Out: 190 [Urine:190]    GA: Awake  Eye: No jaundice  Chest CTA  [facility] RRR  Abd soft not tender  Ext No edema  Skin Moist and warm  Laboratory data:    Lab 06/27/10 0529 06/27/10 0326 06/27/10 0107   NA 143 145 --   K 3.3 4.0 --   CL 106 106 --   CO2 20 20 --   BUN 7 7 --   CREATININE 0.90 0.83 --   EGFR -- -- --   GLUCOSE -- -- --   CALCIUM 8.2* 7.9* --   LABALBU -- -- --   PHOS -- -- 4.2   TACROLIMUS -- -- --         Lab 06/27/10 0529 06/27/10 0326 06/26/10 1755   WBC -- 10.5* 9.3*   HGB 18.0* 16.817.9* 18.7*   HCT -- 48 52*   PLT -- 301 283       Lab Results   Component Value Date    CALCIUM 8.2* 06/27/2010    PHOS 4.2 06/27/2010         Assessment: Phong Isenberg is a 30 y.o. male with h/o of multiple medication overdose including ASA, Nyquil, and pseudophed.  Plan:  (1) Drug overdose. Renal function at baseline. Continue to monitor.  Getting IV HCO3 to alkalinize urine. Last ABG indicated that pt is alkalemia from respiratory/metabolic. Would back off HCO3 supplement. Repeat ABG/ electrolyte in 4-6 hr.  No urgent need for HD base on Electrolyte and volume status.   ASA level is trending down. Continue to monitor.    (2)  Electrolyte: Hypokalemia from cell shift. Would not aggressively replete K.    (3) Volume status: Euvolemia.    (4) Calcium and phosphorous metabolism:  Ca/P ok. Mild hypocalcemia from alkalemia. Check iCa.    (5) Hypertension:  SBP in acceptable range.    (6) Acid-base disorders: as in 1.    (7) Anemia: Hb ok.     (8) Close monitor I/O, avoid nephrotoxic agents. Dose adjust for decreased GFR.    Author: Carl Best, MD  as of: 06/27/2010  at: 9:06 AM    Patient evaluated  Please see note from Dr. Verlan Friends  His note reflects my input and findings    30 y/o male with ASA overdose, hx of bipolar disorder, HTN, ETOH abuse presents after ingestion of 99 ASA pills, 10 nyquil and 45 sudafed yesterday.  NAC given by toxicology.  No hx of CKD and no evidence of AKI at this time.  Is on IV bicarb for urine alkalinization.      Exam and labs as above    ASA toxicity without AKI  - cont with IV Bicarb, titrate levels to achieve urine pH of 7.5 to 8 if possible but avoid severe alkalemia, serum pH greater than 7.6  - monitior blood gas and urine ph closely  - replete K aggressively, even though this is due to shift the presence of low tubular K with promote proton pumping into tubular lumen which will interfere with urine alkalinization  - no need for HD at this time, cont to monitor ASA levels
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
## 2010-06-27 NOTE — Progress Notes (Signed)
 Received pt from ED at midnight. Pt transported via stretcher, alert and oriented. Pt was very diaphoretic. Stated he was not having any pain, however, having trouble hearing due to "static noise" in his ears. Pt assessment generally benign, admitted with overdose and suicidal ideation. States he has no intention to hurt himself currently, does have a 1:1 for safety. Monitoring frequent labs and results reported to team. VSS, afebrile. Pt receiving acetylceistine infusions per orders. A-line placed for frequent ABGs and labs.

## 2010-06-27 NOTE — Procedures (Addendum)
 Procedure Report    Arterial Line Insertion    Successful placement: Yes    Side: left    Arterial Site: radial artery    Attempted Sites: Left radial artery      Procedure  Appropriate Hand hygiene used: Yes    Site prepped with chlorhexidine + alcohol    Appropriate sterile barriers used: Yes    1% lidocaine S.Q. N/A    Artery cannulated with 24G catheter    Technique: with wire guide    Correct placement confirmed by: pulsatile flow    Secured by: suturing and bioocclusive dressing    Patient tolerated procedure well. Yes    Complications: none    Patient's condition at end of procedure: no change from prior      Author: Sherlon Handing, MD  as of: 06/27/2010  at: 2:26 AM    Present throughout procedure.    Darlis Loan, DO  06/27/2010

## 2010-06-27 NOTE — H&P (Signed)
 MICU Admission Note    CC: salicylate toxicity    HPI: This is a 30 year old Caucasian male with history of bipolar disorder, alcohol dependence, hypertension and overdose with Benadryl in the past. He is not currently on any medications at home. He arrives to the ED today after overdose of 99 pills of aspirin, 10 pills of Nyquil and 45 pills of Sudafed. He is unsure when the time of OD was but is estimating it was sometime in the afternoon. In the ED he complained of nausea, vomiting and dyspnea. He did have suicidal intention at the time but no longer has any thoughts. Has 1:1 at bedside. He is complaining of difficulty hearing at this time, calls it a "white noise". He is also feeling diaphoretic as well. He denies any other symptoms.     He was given activated charcoal x 1 in the ED and started on bicarbonate gtt. Foley was inserted. Toxicology saw the patient.     PMH: Past Medical History   Diagnosis Date   . Alcoholism    . Hypertension    . Bipolar disorder           SH:  reports that he has been smoking cigarettes.  He has been smoking about .5 packs per day. He does not have any smokeless tobacco history on file.  He reports that he drinks alcohol.  He reports that he does not currently use illicit drugs.     ROS:  11 Point review of systems competed and is negative except those items mentioned in the HPI.     FH: History reviewed.  No pertinent family history.     Allergies: Allergies as of 06/26/2010 - Up to Date 06/26/2010   Allergen Reaction Noted   . Zoloft  05/10/2010        Physical Exam:   Last set of vitals:  Filed Vitals:    06/26/10 2300   BP: 150/102   Pulse: 122   Temp:    Resp: 22         BP: (136-164)/(85-105)   Temp:  [36.2 C (97.2 F)]   Temp src:  [-]   Heart Rate:  [85-123]   Resp:  [12-24]   SpO2:  [97 %-99 %]   Height:  [177.8 cm (5\' 10" )]   Weight:  [102.059 kg (225 lb)]     General: diaphoretic, flushed  HEENT: black dried stuff on oropharynx, PERRLA, EOMI  Cardiac: tachycardia,  regular rhythm  Resp: CTA bilaterally  Abd: BS+, soft, NT/ND  Ext: no edema  Neuro: AAO x 3, appropriate affect, +2 DTRs, downgoing Babinski bilaterally    Labs:   Recent Labs   [facility] 06/26/10 1755   . WBC 9.3*   . HCT 52*   . PLT 283   . INR 1.0       AG 17  ABG: 7.45/29/90/20    Recent Labs   [facility] 06/26/10 1755   . LABALBU --   . AST 33   . ALT 46   . ALKPHOS 89         Other labs:  Salicylate now 57.6, acetaminophen 20, ethanol 194    EKG:  Sinus rhythm    Radiology:  No results found.    Current Meds:  Scheduled Meds:     . sodium chloride 0.9 %  1,000 mL Intravenous Once   . ondansetron  4 mg Intravenous Once   . sodium chloride 0.9 %  1,000 mL  Intravenous Once   . ondansetron  4 mg Intravenous Once   . charcoal activated  50 g Oral Once       Continuous Infusions:     . Sodium Bicarbonate in D5W     . DISCONTD: Sodium Bicarbonate Infusion     . DISCONTD: sodium chloride 125 mL/hr (06/26/10 2259)       PRN Meds:.     Assessment:    30 year old male with history of overdose p/w salicylate toxicity with tinnitus and concern for acetaminophen toxicity as well. Appreciate toxicology input.     Plan:    Pulm: no issues, on room air    CV: tachycardia 2/2 ingestion    Renal:   - cont bicarb to alkalinize with glucose supplementation as aspirin intoxication may decrease glucose levels  - will also give NAC at this time, time of ingestion isn't clear  - monitor q2hr labs, q2hr ABGs  - renal consult if salicylate level > 80 per tox    GI:   - s/p activated charcoal x 1 and tolerated without difficulty, consider repeating  - have ordered regular diet for now    ID: no issues    Endo: BGs mildly low which is expected, continue to monitor    Heme: no issues    Neuro: alcohol dependence, multiple ingestions  - neuro checks  - CIWA protocol  - PRN benzos for agitation      Code Status: FULL CODE    Signed by Sherlon Handing MD 06/27/2010 at 12:29 AM    Attending note:

## 2010-06-27 NOTE — Interdisciplinary Rounds (Signed)
 06/26/2010 Admitted to [id number] ICU.Pt is a 30 year old with a PMH of depression, alcoholism, bipolar d/o, OD with benadryl and hypertension who is currently receiving care in the ICU for ingestion of 99 aspirin 325 mg, 48 sudafed 30mg ,10 caps of nyquil and 12 pack of labatts beer yesterday.     Foley: Yes  Central Line: no  DVT prophylaxis: Fragmin  PUD prophylaxis: n/a  Sedation: n/a    06/27/2010 Daily Interdisciplinary Goals of Care  Toxicology consult- keep urine Ph .~8.0 (d/t salicylate excretion through the kidney)- bicarb drip- continue foley  Goal Potassium >4   LFT's normal and Tylenol level low- stop acetylcysteine drip  Diet as tolerated  ETOH abuse- CIWA protocol- start MVI, B12 and folate  Liberalize lab draws per order  Psych evaluation tomorrow

## 2010-06-27 NOTE — Progress Notes (Signed)
Arterial Blood Gas Procedure Note:   RT performed Allen's test prior to right radial arterial stick to confirm collateral circulation.   Area prepped with alcohol.   Arterial stick successful.   Number of attempts 1.   Blood obtained & sent for ABG.   RN applied pressure to site for five minutes. There was no evidence of bleeding or hematoma.
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
## 2010-06-27 NOTE — Progress Notes (Signed)
 Assumed care of patient at 0700. A&Ox3, VSS. Checking salicylate levels, electrolytes and urine pH per order. 2 amps of NaHCO3 given this am for urine pH of 5.5 with + effect, repeat value 8 (WNL). Salicylate levels trending down. Micu aware. Foley d/c'd per order, voiding clear yellow urine. Aline not drawing or reading, pulled at 1700. Will continue to monitor and report.   Justin Mend, RN

## 2010-06-28 DIAGNOSIS — T1491XA Suicide attempt, initial encounter: Secondary | ICD-10-CM | POA: Insufficient documentation

## 2010-06-28 DIAGNOSIS — T39091A Poisoning by salicylates, accidental (unintentional), initial encounter: Principal | ICD-10-CM | POA: Insufficient documentation

## 2010-06-28 LAB — BASIC METABOLIC PANEL
Anion Gap: 7 (ref 7–16)
Anion Gap: 7 (ref 7–16)
Anion Gap: 10 (ref 7–16)
CO2: 29 mmol/L — ABNORMAL HIGH (ref 20–28)
CO2: 31 mmol/L — ABNORMAL HIGH (ref 20–28)
CO2: 28 mmol/L (ref 20–28)
Calcium: 8.1 mg/dL — ABNORMAL LOW (ref 9.0–10.3)
Calcium: 8.4 mg/dL — ABNORMAL LOW (ref 9.0–10.3)
Calcium: 9 mg/dL (ref 9.0–10.3)
Chloride: 103 mmol/L (ref 96–108)
Chloride: 105 mmol/L (ref 96–108)
Chloride: 106 mmol/L (ref 96–108)
Creatinine: 1.06 mg/dL (ref 0.67–1.17)
Creatinine: 1.08 mg/dL (ref 0.67–1.17)
Creatinine: 0.89 mg/dL (ref 0.67–1.17)
GFR,Black: >59 *
GFR,Black: >59 *
GFR,Black: >59 *
GFR,Caucasian: >59 *
GFR,Caucasian: >59 *
GFR,Caucasian: >59 *
Glucose: 96 mg/dL (ref 74–106)
Glucose: 92 mg/dL (ref 74–106)
Glucose: 84 mg/dL (ref 74–106)
Lab: 6 mg/dL (ref 6–20)
Lab: 5 mg/dL — ABNORMAL LOW (ref 6–20)
Lab: 5 mg/dL — ABNORMAL LOW (ref 6–20)
Potassium: 3.1 mmol/L — ABNORMAL LOW (ref 3.3–5.1)
Potassium: 3.1 mmol/L — ABNORMAL LOW (ref 3.3–5.1)
Potassium: 3.9 mmol/L (ref 3.3–5.1)
Sodium: 139 mmol/L (ref 133–145)
Sodium: 143 mmol/L (ref 133–145)
Sodium: 144 mmol/L (ref 133–145)

## 2010-06-28 LAB — MAGNESIUM
Magnesium: 1.9 meq/L (ref 1.3–2.1)
Magnesium: 1.7 meq/L (ref 1.3–2.1)
Magnesium: 1.8 meq/L (ref 1.3–2.1)

## 2010-06-28 LAB — CBC AND DIFFERENTIAL
Baso # K/uL: 0 THOU/uL (ref 0.0–0.1)
Basophil %: 0.4 % (ref 0.2–1.2)
Eos # K/uL: 0.2 THOU/uL (ref 0.0–0.5)
Eosinophil %: 2.4 % (ref 0.8–7.0)
Hematocrit: 43 % (ref 40–51)
Hemoglobin: 15.1 g/dL (ref 13.7–17.5)
Lymph # K/uL: 2.2 THOU/uL (ref 1.3–3.6)
Lymphocyte %: 30.1 % (ref 21.8–53.1)
MCV: 89 fL (ref 79–92)
Mono # K/uL: 0.9 THOU/uL — ABNORMAL HIGH (ref 0.3–0.8)
Monocyte %: 11.8 % (ref 5.3–12.2)
Neut # K/uL: 4 THOU/uL (ref 1.8–5.4)
Platelets: 242 THOU/uL (ref 150–330)
RBC: 4.8 MIL/uL (ref 4.6–6.1)
RDW: 13.3 % (ref 11.6–14.4)
Seg Neut %: 55.3 % (ref 34.0–67.9)
WBC: 7.2 THOU/uL (ref 4.2–9.1)

## 2010-06-28 LAB — BLOOD GAS, ARTERIAL
Base Excess, Arterial: 5 mmol/L — ABNORMAL HIGH (ref ?–3)
CO2,ART (Calc): 30 mmol/L — ABNORMAL HIGH (ref 21–28)
CO: 0.3 %
FO2 Hb, Arterial: 96 % — ABNORMAL HIGH (ref 90–95)
HCO3, Arterial: 29 mmol/L — ABNORMAL HIGH (ref 19–23)
Hemoglobin: 15.9 g/dL (ref 13.7–17.5)
Methemoglobin: 0.3 % (ref 0.0–1.0)
pCO2, Arterial: 39 mmHg (ref 36–46)
pH: 7.49 — ABNORMAL HIGH (ref 7.35–7.43)
pO2,Arterial: 81 mmHg (ref 80–100)

## 2010-06-28 LAB — POCT GLUCOSE
Glucose POCT: 98 mg/dL (ref 74–106)
Glucose POCT: 109 mg/dL — ABNORMAL HIGH (ref 74–106)

## 2010-06-28 LAB — SALICYLATE LEVEL
Salicylate: 12.9 mg/dL — ABNORMAL LOW (ref 15.0–30.0)
Salicylate: 8.8 mg/dL — ABNORMAL LOW (ref 15.0–30.0)

## 2010-06-28 LAB — PH,URINE
pH,UA: 8.5 — ABNORMAL HIGH (ref 5.0–8.0)
pH,UA: 8.5 — ABNORMAL HIGH (ref 5.0–8.0)

## 2010-06-28 LAB — PHOSPHORUS
Phosphorus: 2.7 mg/dL (ref 2.7–4.5)
Phosphorus: 2.3 mg/dL — ABNORMAL LOW (ref 2.7–4.5)
Phosphorus: 2.6 mg/dL — ABNORMAL LOW (ref 2.7–4.5)

## 2010-06-28 MED ORDER — POTASSIUM CHLORIDE CRYS CR 20 MEQ PO TBCR *I*
40.0000 meq | ORAL_TABLET | Freq: Once | ORAL | Status: AC
Start: 2010-06-28 — End: 2010-06-28
  Administered 2010-06-28: 40 meq via ORAL
  Filled 2010-06-28: qty 2

## 2010-06-28 MED ORDER — MAGNESIUM SULFATE 2GM IN D5W 50ML *I*
2000.0000 mg | Freq: Once | INTRAVENOUS | Status: AC
Start: 2010-06-28 — End: 2010-06-28
  Administered 2010-06-28: 2000 mg via INTRAVENOUS
  Filled 2010-06-28: qty 50

## 2010-06-28 MED ORDER — POTASSIUM CHLORIDE CRYS CR 20 MEQ PO TBCR *I*
20.0000 meq | ORAL_TABLET | Freq: Once | ORAL | Status: AC
Start: 2010-06-28 — End: 2010-06-28
  Administered 2010-06-28: 20 meq via ORAL
  Filled 2010-06-28: qty 1

## 2010-06-28 NOTE — Progress Notes (Signed)
Arterial Blood Gas Procedure Note:   RT performed Allen's test prior to right radial arterial stick to confirm collateral circulation.   Area prepped with alcohol.   Arterial stick successful.   Number of attempts 1.   Blood obtained & sent for ABG.   RN applied pressure to site for five minutes. There was no evidence of bleeding or hematoma.

## 2010-06-28 NOTE — Interdisciplinary Rounds (Signed)
 06/26/2010 Admitted to [id number] ICU.Pt is a 30 year old with a PMH of depression, alcoholism, bipolar d/o, OD with benadryl and hypertension who is currently receiving care in the ICU for ingestion of 99 aspirin 325 mg, 48 sudafed 30mg ,10 caps of nyquil and 12 pack of labatts beer yesterday.     Foley: No  Central Line: no  DVT prophylaxis: Fragmin  PUD prophylaxis: n/a  Sedation: n/a    06/28/2010 Daily Interdisciplinary Goals of Care  Change labs to Q8 hours  Stop bicarb drip  Salicylate levels decreasing  Call out to med psych unit    06/27/2010 Daily Interdisciplinary Goals of Care  Toxicology consult- keep urine Ph .~8.0 (d/t salicylate excretion through the kidney)- bicarb drip- continue foley  Goal Potassium >4   LFT's normal and Tylenol level low- stop acetylcysteine drip  Diet as tolerated  ETOH abuse- CIWA protocol- start MVI, B12 and folate  Liberalize lab draws per order  Psych evaluation tomorrow

## 2010-06-28 NOTE — Progress Notes (Signed)
 Pt arrived to unit at 1440.  Pt ambulated to bed without difficulty.  Pt has 1:1 at bedside for suicide precautions.  Pt able to make needs known.  Pt on telemetry NSR, occasionally Sinus Tachycardic.  Pt has quarter size abrasion on left knee, open to air and healing.  No other skin issues noted.  Pt offered valium to assist with withdrawal systems, pt refused at this time.  Pt currently has family in room visiting at this time.  Will continue to monitor.

## 2010-06-28 NOTE — Consults (Signed)
 Nephrology Attending Consult Progress Note     LOS: 2 days   S: no acute events overnight  Feels well.  Denies SOB, pain, cramps    Objective Section:  Vitals:   Intake/Output last 24 hours:  Intake/Output Summary (Last 24 hours) at 06/28/10 0900  Last data filed at 06/28/10 0759   Gross per 24 hour   Intake 7287.53 ml   Output   3810 ml   Net 3477.53 ml       Intake/Output this shift:I/O this shift:  In: 150 [I.V.:150]  Out: -       Physical Exam:  WJX:BJYNWGN, but in NAD.  BP's in 130's to 140's/80's - 90's  CV: lungs clear bilaterally, cor: RR, no m, r, g, no JVD  Abd: soft, NT   Ext: no edema  Multiple tattoos, skin somewhat erythematous    Lab Results:  Lab Results   Component Value Date    WBC 7.2 06/28/2010    HGB 15.1 06/28/2010    HCT 43 06/28/2010    MCV 89 06/28/2010    PLT 242 06/28/2010         Lab 06/28/10 0649 06/28/10 0359 06/27/10 2248   NA 143 139 141   K 3.1* 3.1* 2.7*   CL 105 103 104   CO2 31* 29* 29*   BUN 5* 6 7   CREATININE 1.08 1.06 1.23*   EGFR -- -- --   GLUCOSE -- -- --   CALCIUM 8.4* 8.1* 7.7*   LABALBU -- -- --   PHOS 2.3* 2.7 3.1       Lab Results   Component Value Date    CALCIUM 8.4* 06/28/2010    MG 1.7 06/28/2010    PHOS 2.3* 06/28/2010           Assessment:   30 y.o. man with admitted with multi-drug overdose including salicylates.    Plan:  1)Salicylate levels now low.  Kidney function stable    2)lytes: Continues with hypokalemia, multifactorial:  Agree with d/c bicarb gtt at this point.  Should help with low K.  WOuld replete with at least 100 KCl today and would also replete with IV Mg 2 gms    3) Hypophosphatemia: likely due to resp alkalosis and poor po.  Would allow liberal diet.  Should improve next few days.    4) Met alkalosis: Should improve with d/c bicarb gtt.    6)HTN: mild ? If he is on any meds as outpt.  May benefit from low dose of a calcium channel blocker or ACE-inhibitor if persists  Will sign off at this point. Please call us back with any questions/concerns        Author: Jamy Whyte  D Elspeth Blucher  as of: 06/28/2010  at: 9:00 AM

## 2010-06-28 NOTE — Progress Notes (Addendum)
 ICU Daily Progress Note for Inpatients   LOS: 2 days       Interval History: no acute events overnight, potassium repletion for K 3.1. A line was discontinued because it was no longer working.     Subjective: wondering if we can d/c 1:1 and when he can leave, when told he was on suicidal precautions verbalized understanding. Says that tinnitus is improving but still there.     Objective Section:  Temp:  [36.3 C (97.3 F)-36.5 C (97.7 F)] 36.4 C (97.5 F)  Heart Rate:  [80-121] 80   Resp:  [12-21] 12   BP: (126-154)/(62-117) 147/109 mmHg     Intake/Output Summary (Last 24 hours) at 06/28/10 0742  Last data filed at 06/28/10 0559   Gross per 24 hour   Intake 7920.96 ml   Output   4000 ml   Net 3920.96 ml       Vent Settings     Hemodynamics         Physical Exam:  Gen: appears flushed, mildly diaphoretic  HEENT: anicteric, NC/AT  Pulm: CTA bilaterally  Cor: RRR, no murmurs  Abd: BS+, soft, NT/ND  Ext: no edema  Neuro: AAO x 3    Lab Results:  I've reviewed all of the patients labs, I've recorded the pertinent labs below.    Lab 06/28/10 0359 06/28/10 0252 06/27/10 2241 06/27/10 0326 06/26/10 1755   WBC 7.2 -- -- 10.5* 9.3*   HGB 15.1 15.9 16.1 -- --   HCT 43 -- -- 48 52*   PLT 242 -- -- 301 283        Lab 06/28/10 0649 06/28/10 0359 06/27/10 2248   NA 143 139 141   K 3.1* 3.1* 2.7*   CL 105 103 104   CO2 31* 29* 29*   BUN 5* 6 7   CREATININE 1.08 1.06 1.23*   GLUCOSE -- -- --   CALCIUM 8.4* 8.1* 7.7*   MG 1.7 1.9 1.9   PHOS 2.3* 2.7 3.1         Lab 06/27/10 1224 06/27/10 0107 06/26/10 1755   PROT 6.4 6.9 7.5   LABALBU -- -- --   AST 24 20 33   ALT 33 40 46   BILIDIR < 0.2 < 0.2 < 0.2   ALKPHOS 73 77 89            Micro:  I've reviewed the patients microbiology data, I've recorded the pertinent information below.    Imaging: none    Active Hospital Medications:        . potassium chloride SA  40 mEq Oral Once   . potassium chloride SA  20 mEq Oral Once   . sodium bicarbonate  100 mEq Intravenous Once   . multivitamin  1 tablet Oral  Daily   . thiamine  100 mg Oral Daily   . folic acid  1 mg Oral Daily   . dalteparin  5,000 Units Subcutaneous Q24H   . potassium chloride SA  40 mEq Oral Q12H SCH   . sodium chloride 0.9 %  1,000 mL Intravenous Once             . Sodium Bicarbonate in D5W 150 mL/hr (06/28/10 0559)   . DISCONTD: Sodium Bicarbonate in D5W 250 mL/hr (06/27/10 0959)        diazepam, lorazepam     Assessment: 30 year old male with history of bipolar disorder, alcohol dependence, previous suicide attempt who presents with suicide  attempt after ingesting aspirin, sudafed, nyquil and beer.     Patient Active Problem List   Diagnoses Code   . Alcohol dependence with alcohol-induced mood disorder 303.90BC   . HTN (hypertension) 401.9AF   . Salicylate intoxication 965.1U   . Salicylate ototoxicity 388.8P         PLAN:    Plan:     Pulm: no issues, on room air     CV: tachycardia and hypertension 2/2 ingestion   - can use labetalol, clonidine or benzos per toxicology    Renal:   - can likely d/c bicarb, salicylate levels low  - monitor q8hr labs, q8hr ABG   - renal consult if salicylate level > 80 per tox     GI:    - cont reg diet    ID: no issues     Endo: BGs mildly low which is expected, continue to monitor     Heme: no issues     Neuro: alcohol dependence, multiple ingestions   - neuro checks   - CIWA protocol   - PRN benzos for agitation     Psych: will need psych evaluation, cont 1:1    Code Status: FULL CODE      GOALS:  Pulmonary/Ventilator Bundle:  Meet spontaneous breathing trial criteria?  N/A  Decrease FiO2? N/A  Decrease PEEP? N/A  HOB at 30 degrees? yes  DVT prophylaxis? yes  PUD prophylaxis? N/A  Pain/Sedation holiday indicated today? N/A  Did pain/sedation holiday occur yesterday? N/A  Can activity be advanced?  yes  Can catheters/tubes be d/c'ed? no    Medication reconciliation:    Should any home meds be restarted? no  Should any meds be d/c'ed? no    Are daily labs needed? yes    Additional comments: None     Author:  Sherlon Handing, MD  as of: 06/28/2010 at: 7:42 AM     MICU Attending:    This patient was evaluated on rounds with the resident physician, Dr. Howie Ill. I personally examined the patient. I agree with the database, findings, and plan of care recorded in the resident physician note. Please refer to it for details. No events overnight. Off bicarbonate drip. Getting K replaced. Awake, NAD. Ambulating without assistance. CV regular. Lungs clear b/l.    Imp/Plan: 30 y/o w/ bipolar d/o admitted after toxic ingestion of EtOH, ASA, NyQuil, ?Seroquel, and Sudafed. Now stable, off NAC and bicabonate drip. We will Replete lytes. OK to transfer out of ICU. Continue 1:1.

## 2010-06-28 NOTE — Progress Notes (Signed)
 Contacts: Patient and Significant other/Friend        Intervention: SW briefly met with pt's friend Santina Evans who wants to become pt's HCP.  Pt seem to be in agreement however will meet with pt first thing in the morning to discuss. Santina Evans will be in tomorrow at 1pm to complete HCP.       Plan:   Social Work to follow for:  pt's safe dc planning & for any identified SW matters.   SW to follow.

## 2010-06-28 NOTE — Consults (Signed)
 Toxicology Progress Note    Subjective:  Patient is without complaints this morning. He states that his hearing is almost back to baseline, he is no longer diaphoretic, has been ambulating and is eating, drinking and voiding well. He notes normal vision and denies pain anywhere. He denies current SI; when questioned again about intent of ingestion he denies SI and states he thinks it was due to depression s/p alcohol consumption.     Objective:  Vital signs for last 24 hours:  Temp:  [36.4 C (97.5 F)-36.5 C (97.7 F)] 36.4 C (97.5 F)  Heart Rate:  [80-121] 93   Resp:  [12-21] 18   BP: (126-154)/(62-117) 137/94 mmHg    Active Hospital Medications:  Current facility-administered medications   Medication Dose Route Frequency   . potassium chloride SA (K-DUR,KLOR-CON) CR tablet 40 mEq  40 mEq Oral Once   . potassium chloride SA (K-DUR,KLOR-CON) CR tablet 20 mEq  20 mEq Oral Once   . diazepam (VALIUM) tablet 20 mg  20 mg Oral Q2H PRN   . lorazepam (ATIVAN) injection 4 mg  4 mg Intravenous Q2H PRN   . sodium bicarbonate 150 mEq in dextrose 5 % 1,050 mL infusion  150 mL/hr Intravenous Continuous   . multivitamin per tablet 1 tablet  1 tablet Oral Daily   . thiamine tablet 100 mg  100 mg Oral Daily   . folic acid (FOLVITE) tablet 1 mg  1 mg Oral Daily   . Dalteparin Sodium (FRAGMIN) injection 5,000 Units  5,000 Units Subcutaneous Q24H   . potassium chloride SA (K-DUR,KLOR-CON) CR tablet 40 mEq  40 mEq Oral Q12H SCH   . DISCONTD: sodium bicarbonate 150 mEq in dextrose 5 % 1,050 mL infusion  250 mL/hr Intravenous Continuous         Scheduled Meds:     . potassium chloride SA  40 mEq Oral Once   . potassium chloride SA  20 mEq Oral Once   . multivitamin  1 tablet Oral Daily   . thiamine  100 mg Oral Daily   . folic acid  1 mg Oral Daily   . dalteparin  5,000 Units Subcutaneous Q24H   . potassium chloride SA  40 mEq Oral Q12H SCH       Continuous Infusions:     . Sodium Bicarbonate in D5W 150 mL/hr (06/28/10 0759)   .  DISCONTD: Sodium Bicarbonate in D5W 250 mL/hr (06/27/10 0959)       PRN Meds:diazepam, lorazepam    Intake/Output last 3 shifts:  I/O last 3 completed shifts:  In: 8071 [P.O.:3130; I.V.:4084.2; Other:100; IV Piggyback:756.8]  Out: 4000 [Urine:4000]  Intake/Output this shift:  I/O this shift:  In: 150 [I.V.:150]  Out: -     PE:   Gen: sitting up in bed in NAD. Conversant and able to hear without difficulty  HEENT: no nystagmus, anicteric  Cor: RRR, no m/r/g  Pulm: CTAB/L; no wheezing rales or rhonchi noted; no respiratory distress  Skin: warm, not diaphoretic  GU: foley removed  Neuro: no tremor noted  Psych: normal affect and tone; pt appears to have insight as he states that he knows he needs help for his alcohol abuse and depression.    Recent Results (from the past 24 hour(s))   SALICYLATE LEVEL    Collection Time    06/27/10 10:30 AM   Component Value Range   . Salicylate Lvl 48.7 (*) 15.0-30.0 (mg/dL)   BLOOD GAS, ARTERIAL    Collection Time  06/27/10 10:30 AM   Component Value Range   . Hemoglobin 17.5  13.7-17.5 (g/dL)   . pH 7.55 (*) 7.35-7.43    . pCO2, Arterial 30 (*) 36-46 (mm Hg)   . pO2, Arterial 78 (*) 80-100 (mm Hg)   . HCO3, Arterial 25 (*) 19-23 (mmol/L)   . Base Excess, Arterial 4 (*) -3-1 (mmol/L)   . CO2,ART (Calc) 26  21-28 (mmol/L)   . FO2 Hb, Arterial 96 (*) 90-95 (%)   . CO 0.8  (%)   . Methemoglobin 0.4  0.0-1.0 (%)   BASIC METABOLIC PANEL    Collection Time    06/27/10 10:30 AM   Component Value Range   . Glucose 131 (*) 74-106 (mg/dL)   . Sodium 142  133-145 (mmol/L)   . Potassium 3.4  3.3-5.1 (mmol/L)   . Chloride 100  96-108 (mmol/L)   . CO2 23  20-28 (mmol/L)   . Anion Gap 19 (*) 7-16    . UN 10  6-20 (mg/dL)   . Creatinine 1.43 (*) 0.67-1.17 (mg/dL)   . GFR,Caucasian 58 (*) (*)   . GFR,Black > 59  (*)   . Calcium 7.9 (*) 9.0-10.3 (mg/dL)   SALICYLATE LEVEL    Collection Time    06/27/10 12:24 PM   Component Value Range   . Salicylate Lvl 47.3 (*) 15.0-30.0 (mg/dL)   PH,URINE    Collection  Time    06/27/10 12:24 PM   Component Value Range   . pH,UA 8.0  5.0-8.0    MAGNESIUM    Collection Time    06/27/10 12:24 PM   Component Value Range   . Magnesium 1.9  1.3-2.1 (mEq/L)   PHOSPHORUS    Collection Time    06/27/10 12:24 PM   Component Value Range   . Phosphorus 3.4  2.7-4.5 (mg/dL)   HEPATIC FUNCTION PANEL    Collection Time    06/27/10 12:24 PM   Component Value Range   . Total Protein 6.4  6.3-7.7 (g/dL)   . Albumin 4.1  3.5-5.2 (g/dL)   . Bilirubin, Total 0.1  0.0-1.2 (mg/dL)   . Bilirubin, Direct < 0.2  0.0-0.3 (mg/dL)   . Alk Phos 73  40-130 (U/L)   . AST 24  0-50 (U/L)   . ALT 33  0-50 (U/L)   BASIC METABOLIC PANEL    Collection Time    06/27/10 12:24 PM   Component Value Range   . Glucose 155 (*) 74-106 (mg/dL)   . Sodium 141  133-145 (mmol/L)   . Potassium 3.1 (*) 3.3-5.1 (mmol/L)   . Chloride 100  96-108 (mmol/L)   . CO2 26  20-28 (mmol/L)   . Anion Gap 15  7-16    . UN 10  6-20 (mg/dL)   . Creatinine 1.36 (*) 0.67-1.17 (mg/dL)   . GFR,Caucasian > 59  (*)   . GFR,Black > 59  (*)   . Calcium 8.0 (*) 9.0-10.3 (mg/dL)   BLOOD GAS, ARTERIAL    Collection Time    06/27/10 12:25 PM   Component Value Range   . Hemoglobin 18.3 (*) 13.7-17.5 (g/dL)   . pH 7.56 (*) 7.35-7.43    . pCO2, Arterial 28 (*) 36-46 (mm Hg)   . pO2, Arterial 81  80-100 (mm Hg)   . HCO3, Arterial 24 (*) 19-23 (mmol/L)   . Base Excess, Arterial 3 (*) -3-1 (mmol/L)   . CO2,ART (Calc) 25  21-28 (mmol/L)   . FO2 Hb, Arterial  97 (*) 90-95 (%)   . CO 0.1  (%)   . Methemoglobin 0.5  0.0-1.0 (%)   MAGNESIUM    Collection Time    06/27/10  5:40 PM   Component Value Range   . Magnesium 2.0  1.3-2.1 (mEq/L)   PHOSPHORUS    Collection Time    06/27/10  5:40 PM   Component Value Range   . Phosphorus 2.7  2.7-4.5 (mg/dL)   SALICYLATE LEVEL    Collection Time    06/27/10  5:40 PM   Component Value Range   . Salicylate Lvl 35.6 (*) 15.0-30.0 (mg/dL)   BASIC METABOLIC PANEL    Collection Time    06/27/10  5:40 PM   Component Value Range   . Glucose 110 (*) 74-106  (mg/dL)   . Sodium 137  133-145 (mmol/L)   . Potassium 3.1 (*) 3.3-5.1 (mmol/L)   . Chloride 99  96-108 (mmol/L)   . CO2 24  20-28 (mmol/L)   . Anion Gap 14  7-16    . UN 9  6-20 (mg/dL)   . Creatinine 1.27 (*) 0.67-1.17 (mg/dL)   . GFR,Caucasian > 59  (*)   . GFR,Black > 59  (*)   . Calcium 8.1 (*) 9.0-10.3 (mg/dL)   BLOOD GAS, ARTERIAL    Collection Time    06/27/10  5:40 PM   Component Value Range   . Hemoglobin 16.6  13.7-17.5 (g/dL)   . pH 7.56 (*) 7.35-7.43    . pCO2, Arterial 30 (*) 36-46 (mm Hg)   . pO2, Arterial 103 (*) 80-100 (mm Hg)   . HCO3, Arterial 26 (*) 19-23 (mmol/L)   . Base Excess, Arterial 5 (*) -3-1 (mmol/L)   . CO2,ART (Calc) 27  21-28 (mmol/L)   . FO2 Hb, Arterial 97 (*) 90-95 (%)   . CO 1.4  (%)   . Methemoglobin 0.1  0.0-1.0 (%)   PH,URINE    Collection Time    06/27/10  5:49 PM   Component Value Range   . pH,UA 7.5  5.0-8.0    PH,URINE    Collection Time    06/27/10  8:43 PM   Component Value Range   . pH,UA 7.0  5.0-8.0    BLOOD GAS, ARTERIAL    Collection Time    06/27/10 10:41 PM   Component Value Range   . Hemoglobin 16.1  13.7-17.5 (g/dL)   . pH 7.51 (*) 7.35-7.43    . pCO2, Arterial 38  36-46 (mm Hg)   . pO2, Arterial 70 (*) 80-100 (mm Hg)   . HCO3, Arterial 30 (*) 19-23 (mmol/L)   . Base Excess, Arterial 6 (*) -3-1 (mmol/L)   . CO2,ART (Calc) 31 (*) 21-28 (mmol/L)   . FO2 Hb, Arterial 94  90-95 (%)   . CO 0.3  (%)   . Methemoglobin 0.3  0.0-1.0 (%)   MAGNESIUM    Collection Time    06/27/10 10:48 PM   Component Value Range   . Magnesium 1.9  1.3-2.1 (mEq/L)   PHOSPHORUS    Collection Time    06/27/10 10:48 PM   Component Value Range   . Phosphorus 3.1  2.7-4.5 (mg/dL)   BASIC METABOLIC PANEL    Collection Time    06/27/10 10:48 PM   Component Value Range   . Glucose 108 (*) 74-106 (mg/dL)   . Sodium 141  133-145 (mmol/L)   . Potassium 2.7 (*) 3.3-5.1 (mmol/L)   . Chloride 104  96-108 (  mmol/L)   . CO2 29 (*) 20-28 (mmol/L)   . Anion Gap 8  7-16    . UN 7  6-20 (mg/dL)   . Creatinine 1.23 (*)  0.67-1.17 (mg/dL)   . GFR,Caucasian > 59  (*)   . GFR,Black > 59  (*)   . Calcium 7.7 (*) 9.0-10.3 (mg/dL)               Asa level 06/28/2010: 8.8      Assessment/Plan:  30 year old s/p ingestion of 99 325 mg aspiri

## 2010-06-28 NOTE — Progress Notes (Signed)
 AOx3, VSS, denies pain. Ambulated around unit/ down hallway x2, tolerated well. 1:1 at the bed side ATT. Continue to monitor see doc flow for full assessment, VS.

## 2010-06-28 NOTE — Progress Notes (Signed)
 Assumed care of patient at 0700. Patient A+Ox3, afebrile with VSS. No c/o of pain. Bicarb gtt dc'd per team. Received 2 gm of magnesium per order. Ambulates independently and uses bathroom. No issues. Pending transfer to [id number]. Report given to charge nurse from [id number].  Marolyn Hammock, RN

## 2010-06-28 NOTE — Progress Notes (Signed)
 General Daily Progress Note with P.E. for Inpatients   LOS: 2 days     S&O) Pt transferred from MICU to [id number], he overdosed while intoxicated on Sudafed, ASA, Nyquil and possibly GFs Seroquel. Parents called after "friends" alerted them to pt becoming intoxicated and impulsive. He was admitted to MICU, seen by toxicology, received acetylcysteine and placed on a bicarb for acidosis. He also had oto-toxicological sx which resolved as did acidosis. Pt is currently minimizing OD and need for further monitoring, he wants to immediately start a plan to "solve my problems". He is refusing Ativan which would help his agitation.       Vitals: Blood pressure 161/103, pulse 91, temperature 35.9 C (96.6 F), temperature source Temporal, resp. rate 18, height 1.778 m (5\' 10" ), weight 102.059 kg (225 lb), SpO2 99.00%.   Vital-Signs Ranges: Temp:  [35.9 C (96.6 F)-36.5 C (97.7 F)] 35.9 C (96.6 F)  Heart Rate:  [80-108] 91   Resp:  [12-21] 18   BP: (126-161)/(62-109) 161/103 mmHg    O2 Device: None (Room air) (06/28/10 1100)       Intake/Output last 3 shifts:  I/O last 3 completed shifts:  In: 4206.4 [P.O.:1150; I.V.:2850; IV Piggyback:206.4]  Out: 4855 [Urine:4855]  Intake/Output this shift:  I/O this shift:  In: 480 [P.O.:480]  Out: 625 [Urine:625]    Nursing pain score: Last Nursing documented pain:  0-10 Scale: 0 (06/28/10 1457)      Lab Results: All labs in the last 24 hours   Recent Results (from the past 24 hour(s))   MAGNESIUM    Collection Time    06/27/10  5:40 PM   Component Value Range   . Magnesium 2.0  1.3-2.1 (mEq/L)   PHOSPHORUS    Collection Time    06/27/10  5:40 PM   Component Value Range   . Phosphorus 2.7  2.7-4.5 (mg/dL)   SALICYLATE LEVEL    Collection Time    06/27/10  5:40 PM   Component Value Range   . Salicylate Lvl 35.6 (*) 15.0-30.0 (mg/dL)   BASIC METABOLIC PANEL    Collection Time    06/27/10  5:40 PM   Component Value Range   . Glucose 110 (*) 74-106 (mg/dL)   . Sodium 137  133-145 (mmol/L)   .  Potassium 3.1 (*) 3.3-5.1 (mmol/L)   . Chloride 99  96-108 (mmol/L)   . CO2 24  20-28 (mmol/L)   . Anion Gap 14  7-16    . UN 9  6-20 (mg/dL)   . Creatinine 1.27 (*) 0.67-1.17 (mg/dL)   . GFR,Caucasian > 59  (*)   . GFR,Black > 59  (*)   . Calcium 8.1 (*) 9.0-10.3 (mg/dL)   BLOOD GAS, ARTERIAL    Collection Time    06/27/10  5:40 PM   Component Value Range   . Hemoglobin 16.6  13.7-17.5 (g/dL)   . pH 7.56 (*) 7.35-7.43    . pCO2, Arterial 30 (*) 36-46 (mm Hg)   . pO2, Arterial 103 (*) 80-100 (mm Hg)   . HCO3, Arterial 26 (*) 19-23 (mmol/L)   . Base Excess, Arterial 5 (*) -3-1 (mmol/L)   . CO2,ART (Calc) 27  21-28 (mmol/L)   . FO2 Hb, Arterial 97 (*) 90-95 (%)   . CO 1.4  (%)   . Methemoglobin 0.1  0.0-1.0 (%)   PH,URINE    Collection Time    06/27/10  5:49 PM   Component Value Range   . pH,UA  7.5  5.0-8.0    PH,URINE    Collection Time    06/27/10  8:43 PM   Component Value Range   . pH,UA 7.0  5.0-8.0    BLOOD GAS, ARTERIAL    Collection Time    06/27/10 10:41 PM   Component Value Range   . Hemoglobin 16.1  13.7-17.5 (g/dL)   . pH 7.51 (*) 7.35-7.43    . pCO2, Arterial 38  36-46 (mm Hg)   . pO2, Arterial 70 (*) 80-100 (mm Hg)   . HCO3, Arterial 30 (*) 19-23 (mmol/L)   . Base Excess, Arterial 6 (*) -3-1 (mmol/L)   . CO2,ART (Calc) 31 (*) 21-28 (mmol/L)   . FO2 Hb, Arterial 94  90-95 (%)   . CO 0.3  (%)   . Methemoglobin 0.3  0.0-1.0 (%)   MAGNESIUM    Collection Time    06/27/10 10:48 PM   Component Value Range   . Magnesium 1.9  1.3-2.1 (mEq/L)   PHOSPHORUS    Collection Time    06/27/10 10:48 PM   Component Value Range   . Phosphorus 3.1  2.7-4.5 (mg/dL)   BASIC METABOLIC PANEL    Collection Time    06/27/10 10:48 PM   Component Value Range   . Glucose 108 (*) 74-106 (mg/dL)   . Sodium 141  133-145 (mmol/L)   . Potassium 2.7 (*) 3.3-5.1 (mmol/L)   . Chloride 104  96-108 (mmol/L)   . CO2 29 (*) 20-28 (mmol/L)   . Anion Gap 8  7-16    . UN 7  6-20 (mg/dL)   . Creatinine 1.23 (*) 0.67-1.17 (mg/dL)   . GFR,Caucasian > 59  (*)   .  GFR,Black > 59  (*)   . Calcium 7.7 (*) 9.0-10.3 (mg/dL)   SALICYLATE LEVEL    Collection Time    06/27/10 10:48 PM   Component Value Range   . Salicylate Lvl 21.7  15.0-30.0 (mg/dL)   BLOOD GAS, ARTERIAL    Collection Time    06/28/10  2:52 AM   Component Value Range   . Hemoglobin 15.9  13.7-17.5 (g/dL)   . pH 7.49 (*) 7.35-7.43    . pCO2, Arterial 39  36-46 (mm Hg)   . pO2, Arterial 81  80-100 (mm Hg)   . HCO3, Arterial 29 (*) 19-23 (mmol/L)   . Base Excess, Arterial 5 (*) -3-1 (mmol/L)   . CO2,ART (Calc) 30 (*) 21-28 (mmol/L)   . FO2 Hb, Arterial 96 (*) 90-95 (%)   . CO 0.3  (%)   . Methemoglobin 0.3  0.0-1.0 (%)   PH,URINE    Collection Time    06/28/10  3:58 AM   Component Value Range   . pH,UA 8.5 (*) 5.0-8.0    POCT GLUCOSE    Collection Time    06/28/10  3:58 AM   Component Value Range   . Glucose, POC 98  74-106 (mg/dL)   MAGNESIUM    Collection Time    06/28/10  3:59 AM   Component Value Range   . Magnesium 1.9  1.3-2.1 (mEq/L)   PHOSPHORUS    Collection Time    06/28/10  3:59 AM   Component Value Range   . Phosphorus 2.7  2.7-4.5 (mg/dL)   BASIC METABOLIC PANEL    Collection Time    06/28/10  3:59 AM   Component Value Range   . Glucose 96  74-106 (mg/dL)   . Sodium 139  133-145 (mmol/L)   .  Potassium 3.1 (*) 3.3-5.1 (mmol/L)   . Chloride 103  96-108 (mmol/L)   . CO2 29 (*) 20-28 (mmol/L)   . Anion Gap 7  7-16    . UN 6  6-20 (mg/dL)   . Creatinine 1.06  0.67-1.17 (mg/dL)   . GFR,Caucasian > 59  (*)   . GFR,Black > 59  (*)   . Calcium 8.1 (*) 9.0-10.3 (mg/dL)   SALICYLATE LEVEL    Collection Time    06/28/10  3:59 AM   Component Value Range   . Salicylate Lvl 12.9 (*) 15.0-30.0 (mg/dL)   CBC AND DIFFERENTIAL    Collection Time    06/28/10  3:59 AM   Component Value Range   . WBC 7.2  4.2-9.1 (THOU/uL)   . RBC 4.8  4.6-6.1 (MIL/uL)   . Hemoglobin 15.1  13.7-17.5 (g/dL)   . Hematocrit 43  40-51 (%)   . MCV 89  79-92 (fL)   . RDW 13.3  11.6-14.4 (%)   . Platelets 242  150-330 (THOU/uL)   . Seg Neut % 55.3  34.0-67.9 (%)   .  Lymphocyte % 30.1  21.8-53.1 (%)   . Monocyte % 11.8  5.3-12.2 (%)   . Eosinophil % 2.4  0.8-7.0 (%)   . Basophil % 0.4  0.2-1.2 (%)   . Neut # K/uL 4.0  1.8-5.4 (THOU/uL)   . Lymph # K/uL 2.2  1.3-3.6 (THOU/uL)   . Mono # K/uL 0.9 (*) 0.3-0.8 (THOU/uL)   . Eos # K/uL 0.2  0.0-0.5 (THOU/uL)   . Baso # K/uL 0.0  0.0-0.1 (THOU/uL)   PH,URINE    Collection Time    06/28/10  6:48 AM   Component Value Range   . pH,UA 8.5 (*) 5.0-8.0    MAGNESIUM    Collection Time    06/28/10  6:49 AM   Component Value Range   . Magnesium 1.7  1.3-2.1 (mEq/L)   PHOSPHORUS    Collection Time    06/28/10  6:49 AM   Component Value Range   . Phosphorus 2.3 (*) 2.7-4.5 (mg/dL)   SALICYLATE LEVEL    Collection Time    06/28/10  6:49 AM   Component Value Range   . Salicylate Lvl 8.8 (*) 15.0-30.0 (mg/dL)   BASIC METABOLIC PANEL    Collection Time    06/28/10  6:49 AM   Component Value Range   . Glucose 92  74-106 (mg/dL)   . Sodium 143  133-145 (mmol/L)   . Potassium 3.1 (*) 3.3-5.1 (mmol/L)   . Chloride 105  96-108 (mmol/L)   . CO2 31 (*) 20-28 (mmol/L)   . Anion Gap 7  7-16    . UN 5 (*) 6-20 (mg/dL)   . Creatinine 1.08  0.67-1.17 (mg/dL)   . GFR,Caucasian > 59  (*)   . GFR,Black > 59  (*)   . Calcium 8.4 (*) 9.0-10.3 (mg/dL)        *Note: This is not all of the results for the requested time period. They were limited due to a high amount of results. Please view the rest in Results Review.         Radiology impressions (last 3 days):  No results found.    Currently Active/Followed Hospital Problems:  Active Hospital Problems   Diagnoses   . Salicylate overdose     Pt likely took 99 pills of aspirin, 10 pills of Nyquil and 45 pills of Sudafed, possibly Seroquel. He presented acidotic, seen by  toxicology, placed on bicarb drip and admitted to MICU. Will continue tele, follow labs.      . Suicide attempt     Pt currently hopeless/helpless, irritable, will continue 1:1, eval discussed with the psychiatric CL service. Pt may benefit from some Ativan for  agitation though he refused Valium, can't leave AMA.      Marland Kitchen Alcohol dependence with alcohol-induced mood disorder     Admitted to [id number] in 12/11, says he then went to [place] to "escape" and was sober

## 2010-06-29 LAB — BASIC METABOLIC PANEL
Anion Gap: 10 (ref 7–16)
CO2: 24 mmol/L (ref 20–28)
Calcium: 8.8 mg/dL — ABNORMAL LOW (ref 9.0–10.3)
Chloride: 108 mmol/L (ref 96–108)
Creatinine: 0.78 mg/dL (ref 0.67–1.17)
GFR,Black: >59 *
GFR,Caucasian: >59 *
Glucose: 100 mg/dL (ref 74–106)
Lab: 5 mg/dL — ABNORMAL LOW (ref 6–20)
Potassium: 4 mmol/L (ref 3.3–5.1)
Sodium: 142 mmol/L (ref 133–145)

## 2010-06-29 LAB — CBC AND DIFFERENTIAL
Baso # K/uL: 0 THOU/uL (ref 0.0–0.1)
Basophil %: 0.5 % (ref 0.2–1.2)
Eos # K/uL: 0.2 THOU/uL (ref 0.0–0.5)
Eosinophil %: 2.7 % (ref 0.8–7.0)
Hematocrit: 43 % (ref 40–51)
Hemoglobin: 14.9 g/dL (ref 13.7–17.5)
Lymph # K/uL: 2 THOU/uL (ref 1.3–3.6)
Lymphocyte %: 27.7 % (ref 21.8–53.1)
MCV: 91 fL (ref 79–92)
Mono # K/uL: 0.8 THOU/uL (ref 0.3–0.8)
Monocyte %: 10.9 % (ref 5.3–12.2)
Neut # K/uL: 4.3 THOU/uL (ref 1.8–5.4)
Platelets: 256 THOU/uL (ref 150–330)
RBC: 4.7 MIL/uL (ref 4.6–6.1)
RDW: 13 % (ref 11.6–14.4)
Seg Neut %: 58.2 % (ref 34.0–67.9)
WBC: 7.4 THOU/uL (ref 4.2–9.1)

## 2010-06-29 LAB — MAGNESIUM: Magnesium: 1.8 meq/L (ref 1.3–2.1)

## 2010-06-29 LAB — BLOOD GAS, ARTERIAL
Base Excess, Arterial: 1 mmol/L (ref ?–3)
CO2,ART (Calc): 26 mmol/L (ref 21–28)
CO: 1.1 %
FO2 Hb, Arterial: 96 % — ABNORMAL HIGH (ref 90–95)
HCO3, Arterial: 25 mmol/L — ABNORMAL HIGH (ref 19–23)
Hemoglobin: 15.6 g/dL (ref 13.7–17.5)
Methemoglobin: 0.2 % (ref 0.0–1.0)
pCO2, Arterial: 39 mmHg (ref 36–46)
pH: 7.43 (ref 7.35–7.43)
pO2,Arterial: 86 mmHg (ref 80–100)

## 2010-06-29 LAB — PHOSPHORUS: Phosphorus: 3.2 mg/dL (ref 2.7–4.5)

## 2010-06-29 MED ORDER — DIAZEPAM 10 MG PO TABS *I*
10.0000 mg | ORAL_TABLET | ORAL | Status: DC | PRN
Start: 2010-06-29 — End: 2010-06-30

## 2010-06-29 MED ORDER — TRAZODONE HCL 50 MG PO TABS *I*
50.0000 mg | ORAL_TABLET | Freq: Every evening | ORAL | Status: DC
Start: 2010-06-29 — End: 2010-06-30
  Administered 2010-06-29: 50 mg via ORAL
  Filled 2010-06-29 (×2): qty 1

## 2010-06-29 MED ORDER — ATENOLOL 50 MG PO TABS *I*
50.0000 mg | ORAL_TABLET | Freq: Every day | ORAL | Status: DC
Start: 2010-06-29 — End: 2010-06-30
  Administered 2010-06-29 – 2010-06-30 (×2): 50 mg via ORAL
  Filled 2010-06-29 (×3): qty 1

## 2010-06-29 MED ORDER — HYDROXYZINE PAMOATE 50 MG PO CAPS *I*
50.0000 mg | ORAL_CAPSULE | Freq: Three times a day (TID) | ORAL | Status: DC | PRN
Start: 2010-06-29 — End: 2010-06-30
  Filled 2010-06-29 (×2): qty 1

## 2010-06-29 NOTE — Progress Notes (Signed)
Utilization Management    Level of Care Inpatient as of the date 06/26/10      Lowell Mcgurk Marie Waylan Busta, RN     Pager: [phone]

## 2010-06-29 NOTE — Progress Notes (Signed)
 Pt alert, oriented x 3, OOB to bathroom, appetite good, voiding qs.  Pt remains flushed.  Lungs clear, abdomen soft, bowel sounds present.  Girlfriend and mother in to visit, conflict ensued, both left the unit.  Pt cooperative to care.

## 2010-06-29 NOTE — Provider Consult (Addendum)
 Psych Consult Note     Consult Requested by: [id number]  Consult Question: suicidal ideation and substance abuse    Admitting Diagnosis: Suicidal ideation [V62.84]  Salicylate overdose [965.1]  Chief Complaint: eval for suicidal ideation    Patient information was obtained from patient and medical record.  History/Exam limitations: none.  Patient presented voluntarily to the Emergency Department by ambulance      History of Present Illness:  30 y.o. male, recently discharged from inpatient psych on 12/18 with a diagnosis of alcohol dependence with alcohol-induced mood disorder, presented 3 days ago via EMS, willingly but reluctantly, after being found by his girlfriend intoxicated and having ingested a large amount of aspirin, Nyquil and Sudafed.  He took 99 tablets of Aspirin, 12 Nyquil and 48 tabs of Sudafed, according to the toxicology consult note.  He was admitted given activated charcoal, placed on a bicarb drip, given acetylcysteine and admitted to the MICU.  ASA level peaked at 58, Tylenol level peaked at 20.  He did have acute kidney injury on admission but this has now resolved.  He had symptoms of ototoxicity which have also resolved.  He was transferred to [id number] yesterday and psychiatry has been asked to evaluate for suicidality given this serious attempt.    Today, the patient is easily tearful and reports that everyone in his life is trying to hurt him.  He cites this as one of the reasons why he got intoxicated and overdosed.  He acknowledges that he put himself in a life-threatening situation by ingesting what he did and that alcohol impairs his judgment.  He denies suicidal ideation at this time.  He states that he has already been visited by his current girlfriend and his mother this morning and there has been conflict which has upset him.  His girlfriend is upset that she has not been properly introduced to his mother, and she has been sending text messages that have been upsetting to him.  He is  vague about his reasons for being upset with his mother and step-father, but says all they do is "bring me in here and mess up my life and my job".  He also says he is upset because they continue to communicate with his ex-girlfriend, despite him asking them to stop.  He acknowledges that he "probably" needed to come to the hospital this time, but doesn't think the hospitalization in December was necessary.  He states all he did was sit upstairs for a few days and no one could help him with his alcohol problem.  When asked why he didn't follow up with [facility], he states that no one asked him about his schedule prior to discharge and he didn't realize he had an appt set up for the Thursday after discharge until he was down in [place], where he was staying with his father and he wasn't "going to come back just for a stupid appointment".  He again states that he cannot afford outpatient rehab and goes into detail about the costs and what monthly personal expenses are taken into account when calculating a sliding scale fee.  He is reticent to try AA because he doesn't believe in "a higher power other than myself".  He cites two options: one is inpatient rehab and the other is "getting out of here", by which he means going to [place], staying with his father, establishing residency and declaring bankruptcy.  He says he would prefer the latter option.  He believes he  has a problem with alcohol but doesn't think he'll ever be able to abstain from drinking completely because he likes the taste of hops and finds it very difficult to resist when he gets a craving for the taste.  He admits that when he drinks when he is in a bad place mentally, it only exacerbates the bad feelings and he makes poor decisions.  He cannot cite any sources of support in his life right now aside from his father in [place].  He states that both of his parents have problems with alcohol "at times" but they abstain from overdoing  it when he is around.  He does not believe staying with his father would be facilitate his (and his father's) drinking.    Regarding his past history with alcohol, he has been a drinker for a long time but never felt that he needed to stop until this past fall.  His 5.5 year relationship with his live-in girlfriend ended in July.  He says his alcohol use began to increase at that time because he had a lot of extra time on his hands.  She has apparently been living at his house intermittently since then and he has made some attempts to get authorities involved to prevent her from doing so, but has been unsuccessful.  He attributes some of his debt to trying to help his ex-girlfriend and says he has a Industrial/product designer, maxed out credit cards and personal loans to worry about now.      Psychiatric History:  Current Psych Care: no  Past Psych Care: no      Psychotherapy: no      Medication trials: no  Previous physical/sexual abuse: unknown  Previous psychiatric hospitalizations: yes - Dec 13-18 at [facility] for alcohol dependence and suicide attempt with Benadryl, November at [facility] for alcohol dependence and suicidal ideation (few days)  Previous suicide attempts: yes - as above    Past Medical History   Diagnosis Date   . Alcoholism    . Hypertension    . Bipolar disorder        History reviewed.  No pertinent past surgical history.    FH:  Positive for alcohol abuse vs dependence in parents    History   Social History   . Marital Status: Single     Spouse Name: N/A     Number of Children: N/A   . Years of Education: N/A   Social History Main Topics   . Smoking status: Current Everyday Smoker -- 0.5 packs/day     Types: Cigarettes   . Smokeless tobacco: None   . Alcohol Use: Yes      >21   . Drug Use: No   . Sexually Active:    Other Topics Concern   . None   Social History Narrative   . None     Works as a IT sales professional, was supposed to go back yesterday, now isn't sure if his job is still secure.    Allergies: Allergies    Allergen Reactions   . Zoloft          Medications:  No prescriptions prior to admission       Current facility-administered medications   Medication Dose Route Frequency   . magnesium sulfate in dextrose infusion 2,000 mg  2,000 mg Intravenous Once   . diazepam (VALIUM) tablet 20 mg  20 mg Oral Q2H PRN   . multivitamin per tablet 1 tablet  1 tablet Oral  Daily   . thiamine tablet 100 mg  100 mg Oral Daily   . folic acid (FOLVITE) tablet 1 mg  1 mg Oral Daily   . Dalteparin Sodium (FRAGMIN) injection 5,000 Units  5,000 Units Subcutaneous Q24H   . potassium chloride SA (K-DUR,KLOR-CON) CR tablet 40 mEq  40 mEq Oral Q12H SCH   . DISCONTD: lorazepam (ATIVAN) injection 4 mg  4 mg Intravenous Q2H PRN   . DISCONTD: sodium bicarbonate 150 mEq in dextrose 5 % 1,050 mL infusion  150 mL/hr Intravenous Continuous         Review of Systems:  Expectations for ROS: Pertinent: 1    Extended: 2-9     Complete: 10+    Reviewed Review of Systems done by Medicine: yes        Labs   All labs in the last 24 hours   Recent Results (from the past 24 hour(s))   MAGNESIUM    Collection Time    06/28/10 12:15 PM   Component Value Range   . Magnesium 1.8  1.3-2.1 (mEq/L)   PHOSPHORUS    Collection Time    06/28/10 12:15 PM   Component Value Range   . Phosphorus 2.6 (*) 2.7-4.5 (mg/dL)   BASIC METABOLIC PANEL    Collection Time    06/28/10 12:15 PM   Component Value Range   . Glucose 84  74-106 (mg/dL)   . Sodium 144  133-145 (mmol/L)   . Potassium 3.9  3.3-5.1 (mmol/L)   . Chloride 106  96-108 (mmol/L)   . CO2 28  20-28 (mmol/L)   . Anion Gap 10  7-16    . UN 5 (*) 6-20 (mg/dL)   . Creatinine 0.89  0.67-1.17 (mg/dL)   . GFR,Caucasian > 59  (*)   . GFR,Black > 59  (*)   . Calcium 9.0  9.0-10.3 (mg/dL)   POCT GLUCOSE    Collection Time    06/28/10 12:42 PM   Component Value Range   . Glucose, POC 109 (*) 74-106 (mg/dL)   CBC AND DIFFERENTIAL    Collection Time    06/29/10  1:14 AM   Component Value Range   . WBC 7.4  4.2-9.1 (THOU/uL)   . RBC 4.7   4.6-6.1 (MIL/uL)   . Hemoglobin 14.9  13.7-17.5 (g/dL)   . Hematocrit 43  40-51 (%)   . MCV 91  79-92 (fL)   . RDW 13.0  11.6-14.4 (%)   . Platelets 256  150-330 (THOU/uL)   . Seg Neut % 58.2  34.0-67.9 (%)

## 2010-06-29 NOTE — Progress Notes (Signed)
 General Daily SOAP Progress Note for Inpatients   LOS: 3 days       Subjective    Vitals: Blood pressure 141/87, pulse 77, temperature 36.2 C (97.2 F), temperature source Temporal, resp. rate 18, height 1.778 m (5\' 10" ), weight 102.059 kg (225 lb), SpO2 96.00%.   Vital-Signs Ranges: Temp:  [35.1 C (95.2 F)-36.9 C (98.4 F)] 36.2 C (97.2 F)  Heart Rate:  [70-87] 77   Resp:  [18] 18   BP: (137-156)/(87-100) 141/87 mmHg    O2 Device: None (Room air) (06/28/10 2136)      Intake/Output last 3 shifts:  I/O last 3 completed shifts:  In: 2160 [P.O.:2160]  Out: 625 [Urine:625]  Intake/Output this shift:       Nursing pain score: Last Nursing documented pain:  0-10 Scale: 0 (06/29/10 0105)      Objective:  General Appearance:  In no acute distress (a bit irritable initially).    Vital signs: (most recent): Blood pressure 141/87, pulse 77, temperature 36.2 C (97.2 F), temperature source Temporal, resp. rate 18, height 1.778 m (5\' 10" ), weight 102.059 kg (225 lb), SpO2 96.00%.    Output: Producing urine and producing stool.    Heart: Normal rate.  S1 normal and S2 normal.    Extremities: Normal range of motion.    Neurological: Patient is alert and oriented to person, place and time.  Normal strength.    Skin:  Warm and dry.    Abdomen: Abdomen is soft.  There is no abdominal tenderness.           Lab Results: All labs in the last 24 hours   Recent Results (from the past 24 hour(s))   CBC AND DIFFERENTIAL    Collection Time    06/29/10  1:14 AM   Component Value Range   . WBC 7.4  4.2-9.1 (THOU/uL)   . RBC 4.7  4.6-6.1 (MIL/uL)   . Hemoglobin 14.9  13.7-17.5 (g/dL)   . Hematocrit 43  40-51 (%)   . MCV 91  79-92 (fL)   . RDW 13.0  11.6-14.4 (%)   . Platelets 256  150-330 (THOU/uL)   . Seg Neut % 58.2  34.0-67.9 (%)   . Lymphocyte % 27.7  21.8-53.1 (%)   . Monocyte % 10.9  5.3-12.2 (%)   . Eosinophil % 2.7  0.8-7.0 (%)   . Basophil % 0.5  0.2-1.2 (%)   . Neut # K/uL 4.3  1.8-5.4 (THOU/uL)   . Lymph # K/uL 2.0  1.3-3.6  (THOU/uL)   . Mono # K/uL 0.8  0.3-0.8 (THOU/uL)   . Eos # K/uL 0.2  0.0-0.5 (THOU/uL)   . Baso # K/uL 0.0  0.0-0.1 (THOU/uL)   BASIC METABOLIC PANEL    Collection Time    06/29/10  1:14 AM   Component Value Range   . Glucose 100  74-106 (mg/dL)   . Sodium 142  133-145 (mmol/L)   . Potassium 4.0  3.3-5.1 (mmol/L)   . Chloride 108  96-108 (mmol/L)   . CO2 24  20-28 (mmol/L)   . Anion Gap 10  7-16    . UN 5 (*) 6-20 (mg/dL)   . Creatinine 0.78  0.67-1.17 (mg/dL)   . GFR,Caucasian > 59  (*)   . GFR,Black > 59  (*)   . Calcium 8.8 (*) 9.0-10.3 (mg/dL)   MAGNESIUM    Collection Time    06/29/10  1:14 AM   Component Value Range   . Magnesium  1.8  1.3-2.1 (mEq/L)   PHOSPHORUS    Collection Time    06/29/10  1:14 AM   Component Value Range   . Phosphorus 3.2  2.7-4.5 (mg/dL)   BLOOD GAS, ARTERIAL    Collection Time    06/29/10 12:36 PM   Component Value Range   . Hemoglobin 15.6  13.7-17.5 (g/dL)   . pH 7.43  7.35-7.43    . pCO2, Arterial 39  36-46 (mm Hg)   . pO2, Arterial 86  80-100 (mm Hg)   . HCO3, Arterial 25 (*) 19-23 (mmol/L)   . Base Excess, Arterial 1  -3-1 (mmol/L)   . CO2,ART (Calc) 26  21-28 (mmol/L)   . FO2 Hb, Arterial 96 (*) 90-95 (%)   . CO 1.1  (%)   . Methemoglobin 0.2  0.0-1.0 (%)         Radiology impressions (last 3 days):  No results found.    Currently Active/Followed Hospital Problems:  Active Hospital Problems   Diagnoses   . Salicylate overdose     Pt likely took 99 pills of aspirin, 10 pills of Nyquil and 45 pills of Sudafed, possibly Seroquel. He presented acidotic, seen by toxicology, placed on bicarb drip and admitted to MICU. Will continue tele, follow labs.   Will check another ABG and Chem 8's overnight to monitor renal functions and electrolytes.     . Suicide attempt     -can't leave AMA.   -has had several escalating suicide attempts in the setting of alcohol intoxication/abuse and likely dependence   -he has only now begun to see the connection between his alcohol abuse and his behaviors; 'I  need someone to help me with coping skills'  -he is not suicidal now but is 'jonesing' to go home, agree that he can not go AMA as this type of impulsivity would be indicative of poor insight and judgement  -spoke with mother and step father and they are understandably worried about him and that he might kill himself as things escalate.  I did my best to try to help them understand that it is up to [place] to accept help and to concentrate on his recovery--     . Alcohol dependence with alcohol-induced mood disorder     Admitted to [id number] in 12/11, says he then went to [place] to "escape" and was sober up until 06/26/10, admitted with a BAC of 197. Placed on CIWAs, currently refusing Valium, will continue to monitor.   --I have suggested he consider a trial of citalopram given his lability of affect and sadness since he was young boy , he has been resistant to any trial of psychotropics in the past but agreed to consider a trial  --have ordered trazodone for sleep  --vistaril ordered prn for anxiety/agitation  --would continue CIWA but would have NP evaluate for ALL CIWAS >10     . HTN (hypertension)     --high blood pressure now, was told by pcp to stop tenormin last Friday as he had been off this medication and his BP was 138/89 and to follow up in 3 mos.  --today BP very high will restart tenormin again, pt agreeable.           Assessment and Plan Section:  Assessment & Plan  .[facility] Attending   I have personally interviewed and examined the patient. I have reviewed the vital signs, labs, and imaging. I agree with the history, exam and plan as detailed in the note  above. I have discussed the patient with the interdisciplinary [facility] team and personally updated his problem list and treatment plan to reflect my MDM. I have also spoken with his family after obtaining permission from pt to do so. Salient details include the following:  He is clinically stable. Received valium for CIWA of 11 last evening after an  phone call with Ex-GF which was agitating. He does not have any toxodromic sxs nor doe she look like he might be going into alcohol withdrawal.   He remains help rejecting but not suicidal nor homicidal. He realizes that he uses alcohol as a coping mechanism during times of stress and difficulty and he needs to acquire more productive coping skills. He is not psychotic nor manic and he and mother do not feel that inpatient psych admission will be helpful to him as per their most recent  Experience.  I tried to educate mother as to her son's situation and that we can not force him to receive alcohol treatment. He needs to do this on his own with our guidance, support and encouragement. I have given him the full range of options and told him that our recommendations are for him to be in inpatient CD rehab and then to follow up with outpatient program afterwards along with mental health services. I have encouraged him to take citalopram for depressive symptomatology but he has refused but will keep it in mind. He is a bit ambivalent about wanting to get help  but has come far closer than ever before to considering [facility] with outpatient CD/MH treatment afterwards either through [facility] or at [facility].  Currently he is not depressed, despondent hopeless or helpless, he is not psychotic and not in need of involuntary admission for psychiatric reasons as his suicidal acts have always been associated with his alcohol abuse and intoxication. Getting him adequate CD treatment and thus sober with new set of coping skills seems to be the only sure way to keep him safe at this point. This will take time and hard work on Ulysses's part.  I have discussed with Dr. Alveda Reasons who will be coordinating discharge with this primary medical team.    Author: Arnetha Courser, MD  as of: 06/29/2010  at: 5:06 PM

## 2010-06-29 NOTE — Progress Notes (Signed)
 Contacts: Patient, Medical Provider, Nursing Staff and Family    Intervention: SW met with Jeffrey Reese's mother, Jeffrey Reese, who came in to visit Jeffrey Reese however Jeffrey Reese did not want to see or speak with her.  SW was called; gently escorted Jeffrey Reese's mother to family room.  Jeffrey Reese provided copies of chronology of Jeffrey Reese's recent events & hospitalizations, which SW gave to Attending.      Jeffrey Reese's mother reports:   1. Jeffrey Reese states she & Jeffrey Reese's father, Jeffrey Reese, divorced 20+ yrs ago, but that Jeffrey Reese has remained very involved & connected to Jeffrey Reese.   2. Jeffrey Reese lived with his mom until about 33 yrs old & then moved in w/ Jeffrey Reese due to behavioral issues. At this time Jeffrey Reese lived in [place], [place].  3. 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   4. 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 Jeffrey Reese reports they were together for 5-6 yrs and were like a family & the children like their grandchildren. Jeffrey Reese drinking coupled with the 88 yr old child's behavioral issues wedged them apart. Jeffrey Reese moved herself & kids to her mother's house, but Jeffrey Reese's relationship with her has been difficult as she had refused to move her things out of Jeffrey Reese's house as [place] wanted their association to end.   5. Jeffrey Reese continued to drink, try & hold on to his job & via one of Jeffrey Reese's hospitalizations met his recent GF Jeffrey Reese who Jeffrey Reese has become very close with - per Jeffrey Reese's mother.     On the unit yesterday Jeffrey Reese was asking about becoming Jeffrey Reese's HCP however, SW stated that we would need to wait until team feels Jeffrey Reese is capable of making that decision.  SW did give them a  HCP form to preview. Jeffrey Reese reports they did not execute it.     At this time the primary team is working with CL for recommendations for Jeffrey Reese's discharge Jeffrey Reese.  SW provided Jeffrey Reese with information about several outpt programs, but especially [facility] in [facility] that seems to have the hours that Jeffrey Reese would require so he can continue to work & go to both 1:1 as well as group counseling. The problem is that [facility] cannot get Jeffrey Reese in for his intake until next Wednesday, January 11th at 11 am. If Jeffrey Reese were to do partial for a week perhaps writer could set up an intake with [facility] closer to the end date of partial so Jeffrey Reese can transition from one to the other w/out much time in-between.       SW will await recommendations for safe discharge planning.     Plan:   Social Work to follow for:  Jeffrey Reese's safe dc planning & for any identified SW matters.    SW to follow.

## 2010-06-29 NOTE — Progress Notes (Signed)
 Pt received Valium x 1 for CIWA @ 13; positive effect, Pt was able to sleep afterward. Sitter was at his side this entire shift. Pt was upset after receiving a "bad phone call" (per Pt), stating lately all his phone calls have been upsetting. Pt remained in appropriate behavioral control, didn't show any signs of aggressive behavior or impending violence. BP remains on the high side but WNL, asymptomatic.

## 2010-06-29 NOTE — Procedures (Signed)
 ABG drawing on the right artery, modified allen test was positive, site was hold, clean and bandaged, no complication noted.

## 2010-06-29 NOTE — Consults (Signed)
 Toxicology Progress Note    Subjective: Patient states that he feels the same as yesterday when examiner saw him. He states he continues to be comfortable with no diaphoresis or tremor and has been eating/drinking without difficulty. When asked why he was given valium earlier this morning he notes that he was agitated after a phone call and requested it. He is interested in obtaining help for his alcohol abuse but is unsure how to go about this (he voiced yesterday that inpatient would be too expensive for him and that he has no time for outpatient, stating that perhaps AA, about which he has information, may be feasible for him).    Objective:  Vital signs for last 24 hours:  Temp:  [35.1 C (95.2 F)-36.9 C (98.4 F)] 36.4 C (97.5 F)  Heart Rate:  [70-91] 87   Resp:  [18] 18   BP: (137-161)/(94-103) 151/94 mmHg    Active Hospital Medications:  Current facility-administered medications   Medication Dose Route Frequency   . magnesium sulfate in dextrose infusion 2,000 mg  2,000 mg Intravenous Once   . diazepam (VALIUM) tablet 20 mg  20 mg Oral Q2H PRN   . multivitamin per tablet 1 tablet  1 tablet Oral Daily   . thiamine tablet 100 mg  100 mg Oral Daily   . folic acid (FOLVITE) tablet 1 mg  1 mg Oral Daily   . Dalteparin Sodium (FRAGMIN) injection 5,000 Units  5,000 Units Subcutaneous Q24H   . potassium chloride SA (K-DUR,KLOR-CON) CR tablet 40 mEq  40 mEq Oral Q12H SCH   . DISCONTD: lorazepam (ATIVAN) injection 4 mg  4 mg Intravenous Q2H PRN         Scheduled Meds:     . magnesium sulfate in dextrose  2,000 mg Intravenous Once   . multivitamin  1 tablet Oral Daily   . thiamine  100 mg Oral Daily   . folic acid  1 mg Oral Daily   . dalteparin  5,000 Units Subcutaneous Q24H   . potassium chloride SA  40 mEq Oral Q12H SCH       Continuous Infusions:   PRN Meds:diazepam, DISCONTD: lorazepam    Intake/Output last 3 shifts:  I/O last 3 completed shifts:  In: 1770 [P.O.:1320; I.V.:450]  Out: 2075  [Urine:2075]  Intake/Output this shift:  I/O this shift:  In: 360 [P.O.:360]  Out: -          PE:   Gen: Pt lying in bed comfortably; conversant and hearing examiner without difficulty  HEENT: no diaphoresis, no nystagmus, anicteric  Neuro: No hand tremor noted.  GU: without foley  Psych: normal tone and affect; judgment appears normal as pt aware that he needs help for his alcohol use. He did seem to become slightly agitated when discussing paths for alcohol abuse therapy.     Recent Results (from the past 24 hour(s))   MAGNESIUM    Collection Time    06/28/10 12:15 PM   Component Value Range   . Magnesium 1.8  1.3-2.1 (mEq/L)   PHOSPHORUS    Collection Time    06/28/10 12:15 PM   Component Value Range   . Phosphorus 2.6 (*) 2.7-4.5 (mg/dL)   BASIC METABOLIC PANEL    Collection Time    06/28/10 12:15 PM   Component Value Range   . Glucose 84  74-106 (mg/dL)   . Sodium 144  133-145 (mmol/L)   . Potassium 3.9  3.3-5.1 (mmol/L)   . Chloride 106  96-108 (mmol/L)   . CO2 28  20-28 (mmol/L)   . Anion Gap 10  7-16    . UN 5 (*) 6-20 (mg/dL)   . Creatinine 0.89  0.67-1.17 (mg/dL)   . GFR,Caucasian > 59  (*)   . GFR,Black > 59  (*)   . Calcium 9.0  9.0-10.3 (mg/dL)   POCT GLUCOSE    Collection Time    06/28/10 12:42 PM   Component Value Range   . Glucose, POC 109 (*) 74-106 (mg/dL)   CBC AND DIFFERENTIAL    Collection Time    06/29/10  1:14 AM   Component Value Range   . WBC 7.4  4.2-9.1 (THOU/uL)   . RBC 4.7  4.6-6.1 (MIL/uL)   . Hemoglobin 14.9  13.7-17.5 (g/dL)   . Hematocrit 43  40-51 (%)   . MCV 91  79-92 (fL)   . RDW 13.0  11.6-14.4 (%)   . Platelets 256  150-330 (THOU/uL)   . Seg Neut % 58.2  34.0-67.9 (%)   . Lymphocyte % 27.7  21.8-53.1 (%)   . Monocyte % 10.9  5.3-12.2 (%)   . Eosinophil % 2.7  0.8-7.0 (%)   . Basophil % 0.5  0.2-1.2 (%)   . Neut # K/uL 4.3  1.8-5.4 (THOU/uL)   . Lymph # K/uL 2.0  1.3-3.6 (THOU/uL)   . Mono # K/uL 0.8  0.3-0.8 (THOU/uL)   . Eos # K/uL 0.2  0.0-0.5 (THOU/uL)   . Baso # K/uL 0.0  0.0-0.1  (THOU/uL)   BASIC METABOLIC PANEL    Collection Time    06/29/10  1:14 AM   Component Value Range   . Glucose 100  74-106 (mg/dL)   . Sodium 142  133-145 (mmol/L)   . Potassium 4.0  3.3-5.1 (mmol/L)   . Chloride 108  96-108 (mmol/L)   . CO2 24  20-28 (mmol/L)   . Anion Gap 10  7-16    . UN 5 (*) 6-20 (mg/dL)   . Creatinine 0.78  0.67-1.17 (mg/dL)   . GFR,Caucasian > 59  (*)   . GFR,Black > 59  (*)   . Calcium 8.8 (*) 9.0-10.3 (mg/dL)   MAGNESIUM    Collection Time    06/29/10  1:14 AM   Component Value Range   . Magnesium 1.8  1.3-2.1 (mEq/L)   PHOSPHORUS    Collection Time    06/29/10  1:14 AM   Component Value Range   . Phosphorus 3.2  2.7-4.5 (mg/dL)         Assessment/Plan:  30 year old s/p ingestion of 99 325 mg aspirin, 10 tabs nyquil, 48 30 mg sudafed and 12 pack of beer on 06/26/10 who received AC, NAC and bicarb drip for alkalinization of urine now with normal hearing and decreased aspirin levels. The patient continues to look well this morning. He is no longer tachycardic (HR ranging 70's-90's recently) but remains hypertensive (130's-160's/90s'100s). This hypertension may be his baseline as he has a known history of htn (although per pt he recently met with his PCP who deemed antihypertensives no longer needed).   The patient has recovered nicely from his aspirin overdose.   -continue supportive measures (PRN benzos for tachycardia or htn, although pt is unlikely to have alcohol w/d at this point and his tachycardia seems to have resolved).  -again, can consider antihypertensive although per other notes pt seems resistant to starting any in the hospital currently and wishes instead to f/u with PCP for  control of his htn.   -pt verbalizes understanding that he needs assistance with alcohol abuse and would like additional information about community resources from social work.   -please contact toxicology at [phone] for further questions      Medical Toxicology Consult  Patient seen and assessed and discussed with  Tox resident.  I agree with the above note, exam, assessment and plan.    Patient given one dose benzodiazepines for "CIWA 13".  No further tachycardia, tremor, diaphoresis.  Still on [id number].    At this point patient needs f/u with Psychiatry and chemical dependency referral.  No further tox recs.  Call [phone] if questions.    Patient seen on 06/29/2010 at 10:00 for 25 minutes with additional 15 minutes spent in counseling regarding patient's alcohol dependence.  Discussed treatment options, local resources, medication options for craving.  Total time in counseling 15 minutes.

## 2010-06-30 LAB — EKG 12-LEAD
P: 25 degrees
P: 42 degrees
QRS: -4 degrees
QRS: 14 degrees
Rate: 96 {beats}/min
Rate: 117 {beats}/min
Severity: NORMAL
Severity: NORMAL
T: 43 degrees
T: 36 degrees

## 2010-06-30 LAB — BASIC METABOLIC PANEL
Anion Gap: 9 (ref 7–16)
CO2: 25 mmol/L (ref 20–28)
Calcium: 9.1 mg/dL (ref 9.0–10.3)
Chloride: 108 mmol/L (ref 96–108)
Creatinine: 0.88 mg/dL (ref 0.67–1.17)
GFR,Black: >59 *
GFR,Caucasian: >59 *
Glucose: 94 mg/dL (ref 74–106)
Lab: 9 mg/dL (ref 6–20)
Potassium: 4.4 mmol/L (ref 3.3–5.1)
Sodium: 142 mmol/L (ref 133–145)

## 2010-06-30 LAB — CBC AND DIFFERENTIAL
Baso # K/uL: 0 THOU/uL (ref 0.0–0.1)
Basophil %: 0.5 % (ref 0.2–1.2)
Eos # K/uL: 0.3 THOU/uL (ref 0.0–0.5)
Eosinophil %: 3.2 % (ref 0.8–7.0)
Hematocrit: 44 % (ref 40–51)
Hemoglobin: 15.2 g/dL (ref 13.7–17.5)
Lymph # K/uL: 2.6 THOU/uL (ref 1.3–3.6)
Lymphocyte %: 30 % (ref 21.8–53.1)
MCV: 92 fL (ref 79–92)
Mono # K/uL: 0.7 THOU/uL (ref 0.3–0.8)
Monocyte %: 8.7 % (ref 5.3–12.2)
Neut # K/uL: 4.9 THOU/uL (ref 1.8–5.4)
Platelets: 259 THOU/uL (ref 150–330)
RBC: 4.9 MIL/uL (ref 4.6–6.1)
RDW: 12.9 % (ref 11.6–14.4)
Seg Neut %: 57.6 % (ref 34.0–67.9)
WBC: 8.5 THOU/uL (ref 4.2–9.1)

## 2010-06-30 LAB — PHOSPHORUS: Phosphorus: 4.1 mg/dL (ref 2.7–4.5)

## 2010-06-30 LAB — MAGNESIUM: Magnesium: 1.6 meq/L (ref 1.3–2.1)

## 2010-06-30 MED ORDER — MULTIVITAMINS PO TABS *A*
1.0000 | ORAL_TABLET | Freq: Every day | ORAL | Status: AC
Start: 2010-06-30 — End: 2011-06-30

## 2010-06-30 MED ORDER — ATENOLOL 50 MG PO TABS *I*
50.0000 mg | ORAL_TABLET | Freq: Every day | ORAL | Status: AC
Start: 2010-06-30 — End: 2011-06-30

## 2010-06-30 MED ORDER — TRAZODONE HCL 50 MG PO TABS *I*
50.0000 mg | ORAL_TABLET | Freq: Every evening | ORAL | Status: AC
Start: 2010-06-30 — End: 2010-07-10

## 2010-06-30 MED ORDER — FOLIC ACID 1 MG PO TABS *I*
1.0000 mg | ORAL_TABLET | Freq: Every day | ORAL | Status: AC
Start: 2010-06-30 — End: 2011-06-30

## 2010-06-30 MED ORDER — THIAMINE HCL 100 MG PO TABS *WRAPPED*
100.0000 mg | ORAL_TABLET | Freq: Every day | ORAL | Status: AC
Start: 2010-06-30 — End: 2011-06-30

## 2010-06-30 MED ORDER — HYDROXYZINE PAMOATE 50 MG PO CAPS *I*
50.0000 mg | ORAL_CAPSULE | Freq: Three times a day (TID) | ORAL | Status: AC | PRN
Start: 2010-06-30 — End: 2010-07-10

## 2010-06-30 NOTE — Provider Consult (Addendum)
 Subjective/Objective: Pt reports he slept well overall.  Denies suicidal ideation.  Continues to believe that outpatient rehab is the best option for him because he "has to stay active".  Also wants to address what he defines as depression and bipolar tendencies.  When it was explained to him that both chemical dependency and mental health issues can be addressed concomitantly in inpatient rehab, whereas he would need to go to 2 separate places (most likely) to accomplish this on an outpatient basis, he continued to state he would prefer outpatient treatment.  Prentice cites his mother's home as his only housing option if he were to pursue outpatient treatment but also notes that his mother drinks frequently as well.  When asked what he would do if he became angry or irritated, instead of turning to drinking, he could not adequately state who he could turn to but said he might just get in the car and go down to [place] to live with his father.  Today, he states he would possibly be open to [employer] (though he was not enthusiastic about this option yesterday) and that he would attend these in [place], but not [employer].  This morning, the SW office received a phone call from a woman identifying herself as Jerard's ex-girlfriend, stating that he had been calling her all night to say he would commit suicide if he were released.  She declined to leave her name with the SW office and ask that he not be informed that she called.  Taaj was told that an anonymous caller relayed these concerns to the SW office and he denied calling anyone overnight expressing these wishes.  He states he did make phone calls to "tell people I don't need to talk to them anymore", but explicitly denied expressing suicidal ideation or plans to anyone.    Diagnosis: Suicidal ideation [V62.84]  Salicylate overdose [965.1]    Mental Status Exam  Mental Status Exam  Appearance: Groomed  Relationship to Interviewer:  Cooperative  Psychomotor Activity: Normal  Abnormal Movements: None  Speech : Regular rate  Language: Fluent  Mood: Euthymic  Affect: Depressed  Thought Process: Logical  Thought Content: No suicidal ideation  Perceptions/Associations : No hallucinations  Sensorium: Alert;Oriented x3  Cognition: Recent memory intact;Remote memory intact  Fund of Knowledge: Normal  Insight : Poor  Judgement: Fair       Psychiatric Additional Assessments:      Discharge Suicide Risk Assessment  Suicidal Ideation on discharge: None    Discharge Plan includes (See also Patient discharge instructions):   Adequate plan for therapy and treatment  Safe place to live  plan to contact [facility] in the event he feels unsafe, plan to attend daily AA meetings until outpatient rehab begins (at this time, at [facility] will be mid next week)      Assessment of protective factors and risk factors: At high risk of relapsing with alcohol, and then becomes impulsive with his behaviors and decisions.  Without alcohol, not at risk for suicide.    Suicidal Inquiry:  Suicide Ideation for today: None  Suicide plan for today: None  Depression, sadness: Low  Hopeless, overwhelmed: Moderate  Loss of pleasure, interest: None  Anxiety, fearfulness: None  Agitation: None  Psychotic symptoms: None  Cognition problems: None  Sleep disturbance: None  Impulsivity: None - except when intoxicated  Aggressive towards self/others: None - except when intoxicated  Resistance to treatment: Moderate - team recommends inpatient rehab and he is resistant  Withholding information: None  Pain - real or perceived as: None  Perceived loss of health: None  Pessimism if discharged: Low  Lack of support if discharged: Low - if follows through with AA meetings and outpatient rehab, though immediate social supports are thin  Suicide plan outside hospital: None    Assessment of current suicide risk and plan: None         Most recent Vitals:  Patient Vitals in the past 24 hrs:   BP Temp  Temp src Pulse Resp SpO2   06/30/10 1006 137/88 mmHg 36.1 C (97 F) TEMPORAL 83  18  98 %   06/30/10 0626 125/78 mmHg 36 C (96.8 F) TEMPORAL 68  18  98 %   06/30/10 0142 112/69 mmHg 35.9 C (96.6 F) TEMPORAL 59  18  97 %   06/29/10 2300 120/88 mmHg 36.2 C (97.2 F) TEMPORAL 66  18  98 %   06/29/10 1720 137/80 mmHg 36.4 C (97.5 F) TEMPORAL 65  18  97 %   06/29/10 1412 141/87 mmHg 36.2 C (97.2 F) TEMPORAL 77  18  96 %          Pertinent Labs: All labs in the last 24 hours   Recent Results (from the past 24 hour(s))   BLOOD GAS, ARTERIAL    Collection Time    06/29/10 12:36 PM   Component Value Range   . Hemoglobin 15.6  13.7-17.5 (g/dL)   . pH 7.43  7.35-7.43    . pCO2, Arterial 39  36-46 (mm Hg)   . pO2, Arterial 86  80-100 (mm Hg)   . HCO3, Arterial 25 (*) 19-23 (mmol/L)   . Base Excess, Arterial 1  -3-1 (mmol/L)   . CO2,ART (Calc) 26  21-28 (mmol/L)   . FO2 Hb, Arterial 96 (*) 90-95 (%)   . CO 1.1  (%)   . Methemoglobin 0.2  0.0-1.0 (%)   CBC AND DIFFERENTIAL    Collection Time    06/30/10  1:49 AM   Component Value Range   . WBC 8.5  4.2-9.1 (THOU/uL)   . RBC 4.9  4.6-6.1 (MIL/uL)   . Hemoglobin 15.2  13.7-17.5 (g/dL)   . Hematocrit 44  40-51 (%)   . MCV 92  79-92 (fL)   . RDW 12.9  11.6-14.4 (%)   . Platelets 259  150-330 (THOU/uL)   . Seg Neut % 57.6  34.0-67.9 (%)   . Lymphocyte % 30.0  21.8-53.1 (%)   . Monocyte % 8.7  5.3-12.2 (%)   . Eosinophil % 3.2  0.8-7.0 (%)   . Basophil % 0.5  0.2-1.2 (%)   . Neut # K/uL 4.9  1.8-5.4 (THOU/uL)   . Lymph # K/uL 2.6  1.3-3.6 (THOU/uL)   . Mono # K/uL 0.7  0.3-0.8 (THOU/uL)   . Eos # K/uL 0.3  0.0-0.5 (THOU/uL)   . Baso # K/uL 0.0  0.0-0.1 (THOU/uL)   BASIC METABOLIC PANEL    Collection Time    06/30/10  1:49 AM   Component Value Range   . Glucose 94  74-106 (mg/dL)   . Sodium 142  133-145 (mmol/L)   . Potassium 4.4  3.3-5.1 (mmol/L)   . Chloride 108  96-108 (mmol/L)   . CO2 25  20-28 (mmol/L)   . Anion Gap 9  7-16    . UN 9  6-20 (mg/dL)   . Creatinine 0.88  0.67-1.17  (mg/dL)   . GFR,Caucasian > 59  (*)   .  GFR,Black > 59  (*)   . Calcium 9.1  9.0-10.3 (mg/dL)   MAGNESIUM    Collection Time    06/30/10  1:49 AM   Component Value Range   . Magnesium 1.6  1.3-2.1 (mEq/L)   PHOSPHORUS    Collection Time    06/30/10  1:49 AM   Component Value Range   . Phosphorus 4.1  2.7-4.5 (mg/dL)         Impression  30 year old male with alcohol dependence, substance-induced mood disorder as well as possible underlying primary mood disorder admitted after serious suicide attempt by overdose on ASA while intoxicated.  We, along with the primary treatment team, continue to recommend inpatient CD rehab as the primary discharge plan.  However, Jeffrey Reese states he is not ready for inpatient rehab at this time and prefers an outpatient plan.  We discussed with him ways to get help if he were to begin feeling unsafe and could not turn to his mother or his out-of-state father for help, including calling [facility], having [facility] come out, going to the ED, etc.  He verbalized understanding.  He is not at risk of suicide currently but is at risk should he become intoxicated and angry again.    Recommendation  - again, the optimal treatment plan would include inpatient rehab.  However, the patient is unwilling/unable to do this at this time.  - alternatively, patient should be set up with outpatient CD services and outpatient mental health services.  Chari Manning has a program he could attend, however, SW is working on finding alternative options that may be closer to his mother's home.  - if he needs help sooner, finds himself wanting to drink or in danger of hurting himself, he was advised to seek help from his mother or father, call [facility] or go to the ED and he expressed understanding of this.  - strongly advised to begin attending AA meetings in [facility], especially before he is able to get into outpatient CD counseling.  - will hold off on recommending antidepressant at this time given the  overlapping picture of recent substance use and mood disorder.    Seen with Dr. Alveda Reasons, Dr. Dorette Grate, NP from [id number] and Toniann Fail, [place]    Author: Georg Ruddle, MD  as of: 06/30/2010  at: 2:15 PM     Psychiatry C/L Attending.  Chart revie

## 2010-06-30 NOTE — Discharge Summary (Signed)
 Discharge Summary       Admit date: 06/26/2010         Discharge date: 06/30/2010     Admitting Physician: Aletha Halim, MD   Discharge Attending: Arnetha Courser, MD     Patient: Jeffrey Reese Age: 30 y.o. Date of Birth: [birth year] [id number]    Chief Complaint:  Principle Problem: Salicylate overdose    Details of Admission: as per admission H&P    Hospital Course (including key diagnostic test results):  Discharge Diagnoses:  Active Hospital Problems   Diagnoses   . Salicylate overdose [965.1AG]     Pt likely took 99 pills of aspirin, 10 pills of Nyquil and 45 pills of Sudafed. He presented acidotic, seen by toxicology, placed on bicarb drip and admitted to MICU, then transferred to floor bed when salicylate levels safe. Monitored on telemetry, and followed renal functions and lytes, repleted as needed. ABG was still alkalotic on transfer from ICU, 24 hours later ABG was normal. Pt state this was a cry for help. Needs to follow up with outpatient rehabilitation set up for him.        . Suicide attempt [E958.9U]     Has had several escalating suicide attempts in the setting of alcohol intoxication/abuse and likely dependence. He has only now begun to see the connection between his alcohol abuse and his behaviors; stating to attending "I need someone to help me with coping skills." Denies suicidal ideation at this time, reports that he is "jonesing' to go home."  Discussed with pt the relationship of this impulsive act and his alcohol addiction and depression. He does not want a medication for the depression but voices a need to have coping skills. SW has set up outpatient rehabilitation for him at a location close to his residence. He refused inpatient rehabilitation which is what primary team and psychiatry recommended for him.      . Alcohol dependence with alcohol-induced mood disorder [303.90BC]     Admitted to [id number] in 12/11, says he then went to [place] to "escape" and was sober up until 06/26/10, admitted  with a BAC of 197. Placed on CIWAs.   Dr Dorette Grate has suggested he consider a trial of citalopram given his lability of affect and sadness since he was young boy, he has been resistant to any trial of psychotropics in the past but agreed to consider a trial, which he now refuses.  Was ordered trazodone for sleep and vistaril prn for anxiety/agitation while inpatient. Have given a prescription for a 10 day supply, to get him until he his set up with outpatient rehabilitation. Needs follow up with PCP and to find a psychiatric provider suitable to him.        Marland Kitchen HTN (hypertension) [401.9AF]     High blood pressure not related to withdrawal.   Was previously prescribed tenormin but was told by PCP to stop last Friday as he had been off this medication and his BP was 138/89 and to follow up in 3 mos.  Have restarted Tenormin at 25 mg daily. Will need to follow up with PCP before 3 months.         Resolved Hospital Problems   Diagnoses       Jeffrey Reese has been set up with an [facility] rehabilitation intake on 06 July 2010 at 11 am. [address], [place]. [phone].       Key Exam Findings at Discharge:  General Appearance: In no acute distress    Heart: Normal  rate. S1 normal and S2 normal.   Extremities: Normal range of motion.   Neurological: Patient is alert and oriented to person, place and time. Normal strength.   Skin: Warm and dry.   Abdomen: Abdomen is soft. There is no abdominal tenderness.     Vitals: Blood pressure 137/88, pulse 83, temperature 36.1 C (97 F), temperature source Temporal, resp. rate 18, height 1.778 m (5\' 10" ), weight 102.059 kg (225 lb), SpO2 98.00%.    Admission Weight: Weight: 102.059 kg (225 lb)  Discharge Weight: Weight: 102.059 kg (225 lb)       Pending Test Results: None    Consulting Providers: psychiatry    Discharged Condition: good    Discharge medications, instructions, and follow-up plans: as per After Visit Summary  Disposition: Home with no services    Signed: Maurilio Lovely, NP  On: 06/30/2010  at: 2:18 PM

## 2010-06-30 NOTE — Progress Notes (Signed)
 Pt is alert and oriented x 3. No neuro deficits noted. Resting comfortably in bed. Denies SI. Compliant with care and meds. Discharge instructions given, pt states understanding.

## 2010-06-30 NOTE — Progress Notes (Signed)
 Pt alert and oriented x 3.  Calm and cooperative with assessment and medications.  Pt anxious at times.  Pt able to make needs known.  In room resting most of night.  No behavioral issues.  Pt denies suicidal ideations at this time.  Will continue to monitor.

## 2010-06-30 NOTE — Progress Notes (Signed)
 General Daily SOAP Progress Note for Inpatients   LOS: 4 days       Subjective pt feeling better this morning after finally getting to sleep. In better state of mind today. Pt told that anonymous call went to SW office form a woman who did not identify herself stated that he had been calling her all night stating that he was going to kill himself when discharged.  There was no agitation nor disturbance noted by nursing staff overnight and patient denied making this phone calls stating that he has cut the women out of his life and that they are probably trying to get back at him.  I met with Pt and the [facility] team, SW and NP. He was calm in demeanor and placed obstacles in all our suggestions. We offered him all options available for his situation. As he was not actively suicidal nor homicidal and able to plan for himself he did not meet criteria for an involuntary admission to psych, he refused a voluntary admission to psych to deal with mental health issues and then be transferred to inpatient rehab or be set with outpatient program. He refused.  Given that his suicidal acts and manipulations are directly related to alcohol consumption it is our opinion that he need inpatient treatment to deal with this dependency and to also simultaneously treat his mental health issues. He refused. He was only willing to accept outpatient CD treatment and AA group list. Initially acceptable of a MH COPS appt he changed his mind stating that he wanted to find the right psychiatrists.  He was calm without sxs of depression, agitation, mania or psychosis. He was fully oriented and had capacity to make discharge and treatment decision in my opinion.  He thought that he might go home with mother if this might be acceptable.   Vitals: Blood pressure 137/88, pulse 83, temperature 36.1 C (97 F), temperature source Temporal, resp. rate 18, height 1.778 m (5\' 10" ), weight 102.059 kg (225 lb), SpO2 98.00%.   Vital-Signs Ranges: Temp:  [35.9  C (96.6 F)-36.2 C (97.2 F)] 36.1 C (97 F)  Heart Rate:  [59-83] 83   Resp:  [18] 18   BP: (112-137)/(69-88) 137/88 mmHg    O2 Device: Room air humidified (06/30/10 0626)      Intake/Output last 3 shifts:  I/O last 3 completed shifts:  In: 360 [P.O.:360]  Out: -   Intake/Output this shift:       Nursing pain score: Last Nursing documented pain:  0-10 Scale: 0 (06/30/10 0142)      Objective:  General Appearance:  Comfortable.    Vital signs: (most recent): Blood pressure 137/88, pulse 83, temperature 36.1 C (97 F), temperature source Temporal, resp. rate 18, height 1.778 m (5\' 10" ), weight 102.059 kg (225 lb), SpO2 98.00%.  Vital signs are normal.  No fever.    Output: Producing urine and producing stool.    Lungs:  Normal respiratory rate and normal effort.  Breath sounds clear to auscultation.    Heart: Normal rate.  S1 normal and S2 normal.  No murmur.   Extremities: Normal range of motion.    Neurological: Patient is alert and oriented to person, place and time.    Skin:  Warm and dry.    Abdomen: Abdomen is soft.          Lab Results: All labs in the last 24 hours   Recent Results (from the past 24 hour(s))   CBC AND DIFFERENTIAL  Collection Time    06/30/10  1:49 AM   Component Value Range   . WBC 8.5  4.2-9.1 (THOU/uL)   . RBC 4.9  4.6-6.1 (MIL/uL)   . Hemoglobin 15.2  13.7-17.5 (g/dL)   . Hematocrit 44  40-51 (%)   . MCV 92  79-92 (fL)   . RDW 12.9  11.6-14.4 (%)   . Platelets 259  150-330 (THOU/uL)   . Seg Neut % 57.6  34.0-67.9 (%)   . Lymphocyte % 30.0  21.8-53.1 (%)   . Monocyte % 8.7  5.3-12.2 (%)   . Eosinophil % 3.2  0.8-7.0 (%)   . Basophil % 0.5  0.2-1.2 (%)   . Neut # K/uL 4.9  1.8-5.4 (THOU/uL)   . Lymph # K/uL 2.6  1.3-3.6 (THOU/uL)   . Mono # K/uL 0.7  0.3-0.8 (THOU/uL)   . Eos # K/uL 0.3  0.0-0.5 (THOU/uL)   . Baso # K/uL 0.0  0.0-0.1 (THOU/uL)   BASIC METABOLIC PANEL    Collection Time    06/30/10  1:49 AM   Component Value Range   . Glucose 94  74-106 (mg/dL)   . Sodium 142  133-145  (mmol/L)   . Potassium 4.4  3.3-5.1 (mmol/L)   . Chloride 108  96-108 (mmol/L)   . CO2 25  20-28 (mmol/L)   . Anion Gap 9  7-16    . UN 9  6-20 (mg/dL)   . Creatinine 0.88  0.67-1.17 (mg/dL)   . GFR,Caucasian > 59  (*)   . GFR,Black > 59  (*)   . Calcium 9.1  9.0-10.3 (mg/dL)   MAGNESIUM    Collection Time    06/30/10  1:49 AM   Component Value Range   . Magnesium 1.6  1.3-2.1 (mEq/L)   PHOSPHORUS    Collection Time    06/30/10  1:49 AM   Component Value Range   . Phosphorus 4.1  2.7-4.5 (mg/dL)         Radiology impressions (last 3 days):  No results found.    Currently Active/Followed Hospital Problems:  Active Hospital Problems   Diagnoses   . *!*Salicylate overdose     Pt likely took 99 pills of aspirin, 10 pills of Nyquil and 45 pills of Sudafed. He presented acidotic, seen by toxicology, placed on bicarb drip and admitted to MICU, then transferred to floor bed when salicylate levels safe. Monitored on telemetry, and followed renal functions and lytes, repleted as needed. ABG was still alkalotic on transfer from ICU, 24 hours later ABG was normal. Pt state this was a cry for help. Needs to follow up with outpatient rehabilitation set up for him.        . Suicide attempt     Has had several escalating suicide attempts in the setting of alcohol intoxication/abuse and likely dependence. He has only now begun to see the connection between his alcohol abuse and his behaviors; stating to attending "I need someone to help me with coping skills." Denies suicidal ideation at this time, reports that he is "jonesing' to go home."  Discussed with pt the relationship of this impulsive act and his alcohol addiction and depression. He does not want a medication for the depression but voices a need to have coping skills. SW has set up outpatient rehabilitation for him at a location close to his residence. He refused inpatient rehabilitation which is what primary team and psychiatry recommended for him.      . Alcohol dependence  with alcohol-induced mood disorder     Admitted to [phone] in 12/11, says he then went to [place] to "escape" and was sober up until 06/26/10, admitted with a BAC of 197. Placed on CIWAs.   Dr Dorette Grate has suggested he consider a trial of citalopram given his lability of affect and sadness since he was young boy, he has been resistant to any trial of psychotropics in the past but agreed to consider a trial, which he now refuses.  Was ordered trazodone for sleep and vistaril prn for anxiety/agitation while inpatient. Have given a prescription for a 10 day supply, to get him until he his set up with outpatient rehabilitation. Needs follow up with PCP and to find a psychiatric provider suitable to him.        Marland Kitchen HTN (hypertension)     High blood pressure not related to withdrawal.   Was previously prescribed tenormin but was told by PCP to stop last Friday as he had been off this medication and his BP was 138/89 and to follow up in 3 mos.  Have restarted Tenormin at 25 mg daily. Will need to follow up with PCP before 3 months.            Assessment and Plan Section:  Assessment & Plan  I personally examined and reviewed this pt's case and lab findings with [facility] treatment team and agree with the note above. I have personally reviewed the patient's problem list and made addendums where it was appropriate to do so. Briefly this is a 61 you white male with history of alcohol abuse/dependence and poor judgement accompanied by mood dysregulation while under the influence of alcohol particular when it relates to ex-gf. He has not been wanting to go to R-[facility] psych as he feel this was not helpful and that he needs 'real help' with his addiction. Yet he rejected our recommendation to stay on the unit until he would go to an inpatient rehab facility to begin working on sobriety and mood disorder. He refused, making all sorts of excuses. He was clear that while he has heard our recommendations he is not ready to go into  inpatient rehab.    He will therefore be set up with outpatient rehab on 07/06/10 at [facility]. He was provided with list of all AA groups in [facility] . He went home with girlfriend, after stating that he was do

## 2010-06-30 NOTE — Progress Notes (Signed)
 Contacts: Patient, Medical Provider, Nursing Staff, Family and Significant other/Friend        Intervention: Pt to be d/c'd today.  SW set up intake appointment at [facility] in [facility] for Wednesday, January 11th at 11am. Pt aware & approves.  Writer also informed pt that [facility] is in the same building so that he can explore their services for mental health follow up. Pt states he wants to look around for the right psychiatrist.     Pt will be staying with his friend Santina Evans Industrial/product designer) [place] in her apartment at [address] in [place].  Ph [phone].  Pt requested a cab for discharge to take him to parents so that he can pick up his car.  Pt's mother did state pt can stay with them after saying that they didn't think it was a good idea. Also stated they put pt's belongings in his car.  So, pt does have the option to stay with his parents as needed but will be going to [facility] once he picks up his car.       Transportation:  [employer] via SW voucher ; pt does not have medicaid      Plan:   Social Work to follow for:  no further Sw needs have been identified at this time.     Sw is available to assist further as needed.
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
## 2010-06-30 NOTE — Discharge Instructions (Signed)
Brief Summary of Your Hospital Course (including key procedures and diagnostic test results):  You were brought to the hospital after you took an overdose of multiple medications. As it is understood you likely took 99 pills of aspirin, 10 pills of Nyquil and 45 pills of Sudafed. You were acidotic and had to be placed on bicarb drip and admitted to the ICU until most of the aspirin was flushed out of your system. Then you transferred to the [facility] where your labs were watched until you were back to normal. You did not withdraw from alcohol.    Your instructions:  YOU NEED TO STOP DRINKING ALCOHOL. You need to follow up with the rehabilitation options we have set in place for you. Follow up with Dr Deatra Canter about your blood pressure.     What to do after you leave the hospital:  Get in contact with AA and find a sponsor.    Recommended diet: regular diet    Recommended activity: activity as tolerated    Wound Care: none needed    If you experience any of these symptoms within the first 24 hours after discharge:Chest pain, Shortness of breath or Fever of 101 F. or greater please follow up with the discharge attending Arnetha Courser, MD at phone-number: [phone].    If you experience any of these symptoms 24 hours or more after discharge:Chest pain, Shortness of breath, Fever of 101 F. or greater, Irritability, Loss of consciousness or New or worsening headache please follow up with your PCP:  Janeal Holmes, MD, MD [phone]    Suicidal Feelings - How to Help Yourself     Everyone feels sad or unhappy at times, but depressing thoughts and feelings of hopelessness can lead to thoughts of suicide. It can seem as if life is too tough to handle. It is as if the mountain is just too high and your climbing skills are not great enough. At that moment these dark thoughts and feelings may seem overwhelming and never ending. It is important to remember these feelings are temporary! They will go away. If you feel as though you  have reached the point where suicide is the only answer, it is time to let someone know immediately. This is the first step to feeling better. The following steps will move you to safer ground and lead you in a positive direction out of depression.     HOW TO COPE AND PREVENT SUICIDE   Let family, friends, teachers and/or counselors know. Get help. Try not to isolate yourself from those who care about you. Even though you may not feel sociable or think that you are not good company, talk with someone everyday. It is best if it is face to face. Remember, they will want to help you.   Eat a regularly spaced and well-balanced diet, and get plenty of rest.   Avoid alcohol and drugs because they will only make you feel worse and may also lower your inhibitions. Remove them from the home. If you are thinking of taking an overdose of your prescribed medications, give your medicines to someone who can give them to you one day at a time. If you are on antidepressants, let your caregiver know of your feelings so he or she can provide a safer medication, if that is a concern.   Remove weapons or poisons from your home.   Try to stick to routines. That may mean just walking the dog or feeding the cat. Follow a schedule and remind  yourself that you have to keep that schedule every day. Play with your pets. If it is possible, and you do not have a pet, get one. They give you a sense of well-being, lower your blood pressure and make your heart feel good. They need you, and we all want to be needed.   Set some realistic goals and achieve them. Make a list and cross things off as you go. Accomplishments give a sense of worth. Wait until you are feeling better before doing things you find difficult or unpleasant to do.   If you are able, try to start exercising. Even half-hour periods of exercise each day will make you feel better.  Getting out in the sun or into nature helps you recover from depression faster. If you have a  favorite place to walk, take advantage of that.   Increase safe activities that have always given you pleasure. This may include playing your favorite music, reading a good book, painting a picture or playing your favorite instrument. Do whatever takes your mind off your depression and puts a smile on your face.   Keep your living space well lit with windows open, and let the sun shine in. Bright light definitely treats depression, not just people with the seasonal affective disorders (SAD).     Above all else remember, depression is temporary. It will go away. Do not contemplate suicide. Death as a permanent solution is not the answer. Suicide will take away the beautiful rest of life, and do lifelong harm to those around you who love you. Help is available.     National Suicide Help Lines With 24 Hour Help Are:  1-800-SUICIDE  873-755-6364     Document Released: 12/17/2002  Document Re-Released: 07/02/2007  [facility] Patient Information 2011 [place], [place].

## 2010-06-30 NOTE — Discharge Summary (Signed)
 Reviewed discharge summary , please see my note.

## 2010-07-03 NOTE — Miscellaneous (Unsigned)
 Continuity of Care Record  Created: todo  From: ,   From:   From: [employer], EHR [id number]  To: Jeffrey Reese  Purpose: Patient Use;       Problems  Diagnosis: Male Erectile Disorder (302.72)     Social History  Alcohol Use  Smoking (V15.82)   No History of Drug Use    Alerts  Allergy - Latex-asked/denied   Allergy - No Known Drug Allergy     Medications  EpiPen 2-Pak 0.3 MG/0.3ML Device; USE AS DIRECTED. ; Rx

## 2012-05-03 ENCOUNTER — Emergency Department: Payer: Self-pay | Admitting: Emergency Medicine

## 2012-05-03 LAB — COMPREHENSIVE METABOLIC PANEL
Anion Gap: 9 (ref 7–16)
Bilirubin,Total: 0.8 mg/dL (ref 0.2–1.0)
Calcium, Total: 8.9 mg/dL (ref 8.5–10.1)
Co2: 26 mmol/L (ref 21–32)
EGFR (Non-African Amer.): >60
Osmolality: 282 (ref 275–301)
Potassium: 3.7 mmol/L (ref 3.5–5.1)
Sodium: 141 mmol/L (ref 136–145)

## 2012-05-03 LAB — CBC
HGB: 17.2 g/dL (ref 13.0–18.0)
MCV: 90 fL (ref 80–100)
Platelet: 222 10*3/uL (ref 150–440)
RBC: 5.41 10*6/uL (ref 4.40–5.90)
RDW: 13.4 % (ref 11.5–14.5)
WBC: 9.9 10*3/uL (ref 3.8–10.6)

## 2012-05-03 LAB — URINALYSIS, COMPLETE
Bacteria: NONE SEEN
Blood: NEGATIVE
Ketone: NEGATIVE
Leukocyte Esterase: NEGATIVE
Nitrite: NEGATIVE
Protein: NEGATIVE
Specific Gravity: 1.004 (ref 1.003–1.030)
WBC UR: 1 /HPF (ref 0–5)

## 2012-05-03 LAB — ETHANOL: Ethanol %: <0.003 % (ref 0.000–0.080)

## 2012-05-03 LAB — TROPONIN I: Troponin-I: <0.02 ng/mL

## 2014-07-01 ENCOUNTER — Emergency Department: Payer: Self-pay | Admitting: Student

## 2014-07-01 LAB — COMPREHENSIVE METABOLIC PANEL
ALT: 84 U/L — AB
AST: 27 U/L (ref 15–37)
Albumin: 4.2 g/dL (ref 3.4–5.0)
Alkaline Phosphatase: 92 U/L
Anion Gap: 6 — ABNORMAL LOW (ref 7–16)
BUN: 12 mg/dL (ref 7–18)
Bilirubin,Total: 1.3 mg/dL — ABNORMAL HIGH (ref 0.2–1.0)
Calcium, Total: 9 mg/dL (ref 8.5–10.1)
Chloride: 107 mmol/L (ref 98–107)
Co2: 25 mmol/L (ref 21–32)
Creatinine: 1.07 mg/dL (ref 0.60–1.30)
GLUCOSE: 114 mg/dL — AB (ref 65–99)
Osmolality: 276 (ref 275–301)
POTASSIUM: 4.2 mmol/L (ref 3.5–5.1)
SODIUM: 138 mmol/L (ref 136–145)
TOTAL PROTEIN: 7.5 g/dL (ref 6.4–8.2)

## 2014-07-01 LAB — TROPONIN I

## 2014-07-01 LAB — CBC WITH DIFFERENTIAL/PLATELET
BASOS PCT: 0.5 %
Basophil #: 0 10*3/uL (ref 0.0–0.1)
EOS ABS: 0.2 10*3/uL (ref 0.0–0.7)
Eosinophil %: 2.1 %
HCT: 52.4 % — ABNORMAL HIGH (ref 40.0–52.0)
HGB: 17.4 g/dL (ref 13.0–18.0)
LYMPHS ABS: 1.9 10*3/uL (ref 1.0–3.6)
Lymphocyte %: 20.7 %
MCH: 31.8 pg (ref 26.0–34.0)
MCHC: 33.2 g/dL (ref 32.0–36.0)
MCV: 96 fL (ref 80–100)
MONO ABS: 0.9 x10 3/mm (ref 0.2–1.0)
Monocyte %: 10.1 %
NEUTROS ABS: 6.1 10*3/uL (ref 1.4–6.5)
Neutrophil %: 66.6 %
Platelet: 244 10*3/uL (ref 150–440)
RBC: 5.47 10*6/uL (ref 4.40–5.90)
RDW: 13.7 % (ref 11.5–14.5)
WBC: 9.2 10*3/uL (ref 3.8–10.6)

## 2014-07-01 LAB — URINALYSIS, COMPLETE
BACTERIA: NONE SEEN
BILIRUBIN, UR: NEGATIVE
Blood: NEGATIVE
Glucose,UR: NEGATIVE mg/dL (ref 0–75)
Hyaline Cast: 5
Ketone: NEGATIVE
LEUKOCYTE ESTERASE: NEGATIVE
NITRITE: NEGATIVE
Ph: 5 (ref 4.5–8.0)
Protein: NEGATIVE
Specific Gravity: 1.015 (ref 1.003–1.030)
Squamous Epithelial: <1
WBC UR: 13 /HPF (ref 0–5)

## 2015-01-02 ENCOUNTER — Emergency Department
Admission: EM | Admit: 2015-01-02 | Discharge: 2015-01-02 | Disposition: A | Discharge status: Home / Self Care | Payer: BLUE CROSS/BLUE SHIELD | Attending: Emergency Medicine | Admitting: Emergency Medicine

## 2015-01-02 ENCOUNTER — Encounter: Payer: Self-pay | Admitting: Emergency Medicine

## 2015-01-02 DIAGNOSIS — Y9289 Other specified places as the place of occurrence of the external cause: Secondary | ICD-10-CM | POA: Diagnosis not present

## 2015-01-02 DIAGNOSIS — Y998 Other external cause status: Secondary | ICD-10-CM | POA: Diagnosis not present

## 2015-01-02 DIAGNOSIS — Y9389 Activity, other specified: Secondary | ICD-10-CM | POA: Diagnosis not present

## 2015-01-02 DIAGNOSIS — Z79899 Other long term (current) drug therapy: Secondary | ICD-10-CM | POA: Insufficient documentation

## 2015-01-02 DIAGNOSIS — Z7952 Long term (current) use of systemic steroids: Secondary | ICD-10-CM | POA: Insufficient documentation

## 2015-01-02 DIAGNOSIS — T63481A Toxic effect of venom of other arthropod, accidental (unintentional), initial encounter: Secondary | ICD-10-CM

## 2015-01-02 DIAGNOSIS — Z720 Tobacco use: Secondary | ICD-10-CM | POA: Diagnosis not present

## 2015-01-02 DIAGNOSIS — T7840XA Allergy, unspecified, initial encounter: Secondary | ICD-10-CM | POA: Diagnosis not present

## 2015-01-02 DIAGNOSIS — T63441A Toxic effect of venom of bees, accidental (unintentional), initial encounter: Secondary | ICD-10-CM | POA: Diagnosis not present

## 2015-01-02 HISTORY — DX: Bee allergy status: Z91.030

## 2015-01-02 MED ORDER — FAMOTIDINE IN NACL 20-0.9 MG/50ML-% IV SOLN
20.0000 mg | Freq: Once | INTRAVENOUS | Status: AC
  Administered 2015-01-02: 20 mg via INTRAVENOUS

## 2015-01-02 MED ORDER — METHYLPREDNISOLONE SODIUM SUCC 125 MG IJ SOLR
125.0000 mg | Freq: Once | INTRAMUSCULAR | Status: AC
  Administered 2015-01-02: 125 mg via INTRAVENOUS

## 2015-01-02 MED ORDER — PREDNISONE 10 MG PO TABS: 10.0000 mg | ORAL_TABLET | Freq: Every day | ORAL | Status: DC

## 2015-01-02 MED ORDER — EPINEPHRINE 0.3 MG/0.3ML IJ SOAJ: 0.3000 mg | Freq: Once | INTRAMUSCULAR | Status: DC

## 2015-01-02 MED ORDER — SODIUM CHLORIDE 0.9 % IV BOLUS (SEPSIS)
1000.0000 mL | Freq: Once | INTRAVENOUS | Status: AC
  Administered 2015-01-02: 1000 mL via INTRAVENOUS

## 2015-01-02 NOTE — ED Notes (Signed)
Pt with decreased swelling noted to right lateral neck.

## 2015-01-02 NOTE — ED Notes (Signed)
Pt with bee sting to right lateral neck, area of swelling and redness noted. Pt states took  of benadryl prior to arrival. Pt without angioedema noted. Pt with area of redness noted to right lower abd wall and left lower abd wall, pt states was stung by bee there also. No swelling noted to area. Pt denies nausea, vomiting, diarrhea. Pt denies shob. Facial skin flushed.

## 2015-01-02 NOTE — Discharge Instructions (Signed)
Return to the emergency department for any new or worsening condition including any worsening swelling, rash, wheezing, trouble breathing, chest pain, swelling of face, tongue, or throat. Take 25 mg Benadryl every 4 hours while awake for 4 days. Take over-the-counter Prilosec 40 mg once daily for 4 days.   Anaphylactic Reaction An anaphylactic reaction is a sudden, severe allergic reaction that involves the whole body. It can be life threatening. A hospital stay is often required. People with asthma, eczema, or hay fever are slightly more likely to have an anaphylactic reaction. CAUSES  An anaphylactic reaction may be caused by anything to which you are allergic. After being exposed to the allergic substance, your immune system becomes sensitized to it. When you are exposed to that allergic substance again, an allergic reaction can occur. Common causes of an anaphylactic reaction include:  Medicines.  Foods, especially peanuts, wheat, shellfish, milk, and eggs.  Insect bites or stings.  Blood products.  Chemicals, such as dyes, latex, and contrast material used for imaging tests. SYMPTOMS  When an allergic reaction occurs, the body releases histamine and other substances. These substances cause symptoms such as tightening of the airway. Symptoms often develop within seconds or minutes of exposure. Symptoms may include:  Skin rash or hives.  Itching.  Chest tightness.  Swelling of the eyes, tongue, or lips.  Trouble breathing or swallowing.  Lightheadedness or fainting.  Anxiety or confusion.  Stomach pains, vomiting, or diarrhea.  Nasal congestion.  A fast or irregular heartbeat (palpitations). DIAGNOSIS  Diagnosis is based on your history of recent exposure to allergic substances, your symptoms, and a physical exam. Your caregiver may also perform blood or urine tests to confirm the diagnosis. TREATMENT  Epinephrine medicine is the main treatment for an anaphylactic  reaction. Other medicines that may be used for treatment include antihistamines, steroids, and albuterol. In severe cases, fluids and medicine to support blood pressure may be given through an intravenous line (IV). Even if you improve after treatment, you need to be observed to make sure your condition does not get worse. This may require a stay in the hospital. [place] a medical alert bracelet or necklace stating your allergy.  You and your family must learn how to use an anaphylaxis kit or give an epinephrine injection to temporarily treat an emergency allergic reaction. Always carry your epinephrine injection or anaphylaxis kit with you. This can be lifesaving if you have a severe reaction.  Do not drive or perform tasks after treatment until the medicines used to treat your reaction have worn off, or until your caregiver says it is okay.  If you have hives or a rash:  Take medicines as directed by your caregiver.  You may use an over-the-counter antihistamine (diphenhydramine) as needed.  Apply cold compresses to the skin or take baths in cool water. Avoid hot baths or showers. SEEK MEDICAL CARE IF:   You develop symptoms of an allergic reaction to a new substance. Symptoms may start right away or minutes later.  You develop a rash, hives, or itching.  You develop new symptoms. SEEK IMMEDIATE MEDICAL CARE IF:   You have swelling of the mouth, difficulty breathing, or wheezing.  You have a tight feeling in your chest or throat.  You develop hives, swelling, or itching all over your body.  You develop severe vomiting or diarrhea.  You feel faint or pass out. This is an emergency. Use your epinephrine injection or anaphylaxis kit as you  have been instructed. Call your local emergency services (911 in [place] Even if you improve after the injection, you need to be examined at a hospital emergency department. MAKE SURE YOU:   Understand these  instructions.  Will watch your condition.  Will get help right away if you are not doing well or get worse. Document Released: 06/12/2005 Document Revised: 06/17/2013 Document Reviewed: 09/13/2011 [facility] Patient Information 2015 [place], [place]. This information is not intended to replace advice given to you by your health care provider. Make sure you discuss any questions you have with your health care provider.

## 2015-01-02 NOTE — ED Provider Notes (Addendum)
[facility] Emergency Department Provider Note   ____________________________________________  Time seen: On arrival to room I have reviewed the triage vital signs and the triage nursing note.  HISTORY  Chief Complaint Allergic Reaction   Historian Patient  HPI Ryan Faulkner is a 34 y.o. 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 Patient was bit/stung by insect to the abdomen and the neck. This occurred just prior to arrival. The patient did take 100 mg of by mouth Benadryl prior to arrival. He is having localized redness and swelling. He is also having hives to his face. He is having moderate pain at the location of the stings. No trouble breathing, wheezing, swelling of the throat or trouble swallowing.    Past Medical History  Diagnosis Date  . Bee sting allergy     Pt. bit multiple times by hornets    There are no active problems to display for this patient.   Past Surgical History  Procedure Laterality Date  . Tonsillectomy      Current Outpatient Rx  Name  Route  Sig  Dispense  Refill  . diphenhydrAMINE (BENADRYL) 25 mg capsule   Oral   Take 100 mg by mouth every 6 (six) hours as needed for allergies.         Marland Kitchen. EPINEPHrine (EPIPEN 2-PAK) 0.3 mg/0.3 mL IJ SOAJ injection   Intramuscular   Inject 0.3 mLs (0.3 mg total) into the muscle once.   1 Device   2   . predniSONE (DELTASONE) 10 MG tablet   Oral   Take 1 tablet (10 mg total) by mouth daily. Take 40 mg by mouth for 2 days Take 30 mg by mouth for 2 days Takes 20 mg by mouth for 2 days Take 10 mg by mouth for 2 days   20 tablet   0     Allergies Review of patient's allergies indicates no known allergies.  No family history on file.  Social History History  Substance Use Topics  . Smoking status: Current Every Day Smoker  . Smokeless tobacco: Not on file  . Alcohol Use: 14.4 oz/week    24 Cans of beer per week    Review of  Systems  Constitutional: Negative for fever. Eyes: Negative for visual changes. ENT: Negative for sore throat. Cardiovascular: Negative for chest pain. Respiratory: Negative for shortness of breath. Gastrointestinal: Negative for vomiting and diarrhea. Genitourinary:  Musculoskeletal:  Skin: Redness/hives to face Neurological: Negative for headaches, focal weakness or numbness. 10 point Review of Systems otherwise negative ____________________________________________   PHYSICAL EXAM:  VITAL SIGNS: ED Triage Vitals  Enc Vitals Group     BP 01/02/15 2015 154/101 mmHg     Pulse Rate 01/02/15 2015 91     Resp 01/02/15 2015 18     Temp 01/02/15 2015 98.8 F (37.1 C)     Temp src --      SpO2 01/02/15 2015 96 %     Weight 01/02/15 2015 240 lb (108.863 kg)     Height 01/02/15 2015 5\' 10"  (1.778 m)     Head Cir --      Peak Flow --      Pain Score 01/02/15 2016 0     Pain Loc --      Pain Edu? --      Excl. in GC? --      Constitutional: Alert and oriented. Well appearing and in no distress. Eyes: Conjunctivae are  normal. PERRL. Normal extraocular movements. ENT   Head: Normocephalic and atraumatic. Hives/erythema across the cheeks and forehead.   Nose: No congestion/rhinnorhea.   Mouth/Throat: Mucous membranes are moist.   Neck: No stridor. Right-sided neck swelling with redness and bite/sting near the lower side of the right neck. Cardiovascular/Chest: Normal rate, regular rhythm.  No murmurs, rubs, or gallops. Respiratory: Normal respiratory effort without tachypnea nor retractions. Breath sounds are clear and equal bilaterally. No wheezes/rales/rhonchi. Gastrointestinal: Soft. No distention, no guarding, no rebound. Tenderness localized to the area 3 bite/sting marks with surrounding erythema and redness and swelling.  Genitourinary/rectal:Deferred Musculoskeletal: Nontender with normal range of motion in all extremities. No joint effusions.  No lower  extremity tenderness nor edema. Neurologic:  Normal speech and language. No gross focal neurologic deficits are appreciated. Skin:  Skin is warm, dry and intact. No rash noted. Psychiatric: Mood and affect are normal. Speech and behavior are normal. Patient exhibits appropriate insight and judgment.  ____________________________________________   EKG I, Governor Rooks, MD, the attending physician have personally viewed and interpreted all ECGs.  None ____________________________________________  LABS (pertinent positives/negatives)  None  ____________________________________________  RADIOLOGY All Xrays were viewed by me. Imaging interpreted by Radiologist.  None __________________________________________  PROCEDURES  Procedure(s) performed: None Critical Care performed: None  ____________________________________________   ED COURSE / ASSESSMENT AND PLAN  CONSULTATIONS: None  Pertinent labs & imaging results that were available during my care of the patient were reviewed by me and considered in my medical decision making (see chart for details).   The patient has mostly localized significant swelling at the site of the bites/stings, however due to his history of anaphylaxis as well as hives erythema to his face, I am going to go ahead and treat him systemically with soluMedrol as well as H2 blocker. He already took 100 mg of Bentyl prior to arrival. Currently he is not having trouble breathing, hypotension, trouble swallowing, and he does not need epinephrine at this point in time. He is going be observed for a few hours.  11:30 patient was reassessed and the redness is essentially resolved over his abdomen right neck and his face. We discussed return precautions, treatment plan, and follow-up plan.  Patient / Family / Caregiver informed of clinical course, medical decision-making process, and agree with plan.   I discussed return precautions, follow-up instructions, and  discharged instructions with patient and/or family.  ___________________________________________   FINAL CLINICAL IMPRESSION(S) / ED DIAGNOSES   Final diagnoses:  Allergic reaction to insect sting, accidental or unintentional, initial encounter    FOLLOW UP  Referred to: Your primary care doctor   Governor Rooks, MD 01/02/15 2113  Governor Rooks, MD 01/02/15 2330

## 2015-01-02 NOTE — ED Notes (Signed)
Pt without redness noted to abd wall. Pt with stable vs. Pt with decreased swelling and redness noted to right lateral neck. resps unlabored.

## 2015-01-02 NOTE — ED Notes (Signed)
Pt. ambultory into triage.  Pt. States he is allergic to bee and hornet stings.  Pt. Bite on side of neck.  Pt. Also bit multiple times on abdominal area.

## 2017-08-14 ENCOUNTER — Encounter: Payer: Self-pay | Admitting: Emergency Medicine

## 2017-08-14 ENCOUNTER — Emergency Department
Admission: EM | Admit: 2017-08-14 | Discharge: 2017-08-14 | Disposition: A | Discharge status: Home / Self Care | Payer: BLUE CROSS/BLUE SHIELD | Attending: Student in an Organized Health Care Education/Training Program | Admitting: Student in an Organized Health Care Education/Training Program

## 2017-08-14 ENCOUNTER — Emergency Department: Payer: BLUE CROSS/BLUE SHIELD

## 2017-08-14 ENCOUNTER — Other Ambulatory Visit: Payer: Self-pay

## 2017-08-14 DIAGNOSIS — F17200 Nicotine dependence, unspecified, uncomplicated: Secondary | ICD-10-CM | POA: Insufficient documentation

## 2017-08-14 DIAGNOSIS — R079 Chest pain, unspecified: Secondary | ICD-10-CM | POA: Insufficient documentation

## 2017-08-14 DIAGNOSIS — I159 Secondary hypertension, unspecified: Secondary | ICD-10-CM | POA: Insufficient documentation

## 2017-08-14 DIAGNOSIS — I10 Essential (primary) hypertension: Secondary | ICD-10-CM | POA: Insufficient documentation

## 2017-08-14 HISTORY — DX: Essential (primary) hypertension: I10

## 2017-08-14 LAB — CBC
HEMATOCRIT: 53.1 % — AB (ref 40.0–52.0)
Hemoglobin: 18.2 g/dL — ABNORMAL HIGH (ref 13.0–18.0)
MCH: 31.2 pg (ref 26.0–34.0)
MCHC: 34.3 g/dL (ref 32.0–36.0)
MCV: 90.9 fL (ref 80.0–100.0)
Platelets: 240 10*3/uL (ref 150–440)
RBC: 5.84 MIL/uL (ref 4.40–5.90)
RDW: 13.8 % (ref 11.5–14.5)
WBC: 8.8 10*3/uL (ref 3.8–10.6)

## 2017-08-14 LAB — BASIC METABOLIC PANEL
ANION GAP: 11 (ref 5–15)
BUN: 6 mg/dL (ref 6–20)
CO2: 25 mmol/L (ref 22–32)
Calcium: 10 mg/dL (ref 8.9–10.3)
Chloride: 102 mmol/L (ref 101–111)
Creatinine, Ser: 0.84 mg/dL (ref 0.61–1.24)
GFR calc Af Amer: >60 mL/min (ref >60)
GLUCOSE: 115 mg/dL — AB (ref 65–99)
POTASSIUM: 4.2 mmol/L (ref 3.5–5.1)
Sodium: 138 mmol/L (ref 135–145)

## 2017-08-14 LAB — TROPONIN I
Troponin I: <0.03 ng/mL (ref <0.03)
Troponin I: <0.03 ng/mL (ref <0.03)

## 2017-08-14 MED ORDER — AMLODIPINE BESYLATE 5 MG PO TABS: 5.0000 mg | ORAL_TABLET | Freq: Every day | ORAL | 0 refills | Status: DC

## 2017-08-14 MED ORDER — SODIUM CHLORIDE 0.9 % IV BOLUS (SEPSIS)
500.0000 mL | Freq: Once | INTRAVENOUS | Status: AC
  Administered 2017-08-14: 500 mL via INTRAVENOUS

## 2017-08-14 MED ORDER — LORAZEPAM 1 MG PO TABS
1.0000 mg | ORAL_TABLET | Freq: Once | ORAL | Status: AC
  Administered 2017-08-14: 1 mg via ORAL
  Filled 2017-08-14: qty 1

## 2017-08-14 MED ORDER — AMLODIPINE BESYLATE 5 MG PO TABS
5.0000 mg | ORAL_TABLET | Freq: Once | ORAL | Status: AC
  Administered 2017-08-14: 5 mg via ORAL
  Filled 2017-08-14: qty 1

## 2017-08-14 NOTE — ED Triage Notes (Signed)
Chest pain x2-3 days , left chest wall "burning sensation" , HTN .

## 2017-08-14 NOTE — ED Provider Notes (Signed)
[facility] Emergency Department Provider Note    None    (approximate)  I have reviewed the triage vital signs and the nursing notes.   HISTORY  Chief Complaint Chest Pain    HPI Ryan Faulkner is a 37 y.o. male with a history of hypertension not on any antihypertensive medication presents with 3 days of burning left-sided chest pain radiating to his left shoulder.  States is been primarily constant throughout the day.  Does get better when he goes to sleep.  Patient states that his father died 2 weeks ago and thinks this may be secondary to stress.  Denies any diaphoresis.  Noted pain with deep inspiration.  No shortness of breath.  No nausea or vomiting.  No fevers.  No cough.  No previous history of MI or cardiac disease.  No lower extremity swelling.  The pain is rated as mild in severity.  Past Medical History:  Diagnosis Date  . 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 Hypertension    No family history on file. Past Surgical History:  Procedure Laterality Date  . TONSILLECTOMY     There are no active problems to display for this patient.     Prior to Admission medications   Medication Sig Start Date End Date Taking? Authorizing Provider  amLODipine (NORVASC) 5 MG tablet Take 1 tablet (5 mg total) by mouth daily. 08/14/17 08/14/18  Willy Eddy, MD  diphenhydrAMINE (BENADRYL) 25 mg capsule Take 100 mg by mouth every 6 (six) hours as needed for allergies.    [provider]  EPINEPHrine (EPIPEN 2-PAK) 0.3 mg/0.3 mL IJ SOAJ injection Inject 0.3 mLs (0.3 mg total) into the muscle once. 01/02/15   Governor Rooks, MD  predniSONE (DELTASONE) 10 MG tablet Take 1 tablet (10 mg total) by mouth daily. Take 40 mg by mouth for 2 days Take 30 mg by mouth for 2 days Takes 20 mg by mouth for 2 days Take 10 mg by mouth for 2 days Patient not taking: Reported on 08/14/2017 01/02/15   Governor Rooks, MD    Allergies Patient has no  known allergies.    Social History Social History   Tobacco Use  . Smoking status: Current Every Day Smoker  . Smokeless tobacco: Never Used  Substance Use Topics  . Alcohol use: Yes    Alcohol/week: 14.4 oz    Types: 24 Cans of beer per week  . Drug use: No    Review of Systems Patient denies headaches, rhinorrhea, blurry vision, numbness, shortness of breath, chest pain, edema, cough, abdominal pain, nausea, vomiting, diarrhea, dysuria, fevers, rashes or hallucinations unless otherwise stated above in HPI. ____________________________________________   PHYSICAL EXAM:  VITAL SIGNS: Vitals:   08/14/17 1430 08/14/17 1530  BP: (!) 158/116 (!) 141/101  Pulse: 83 81  Resp: 19 19  Temp:    SpO2: 98% 99%    Constitutional: Alert and oriented. in no acute distress. Eyes: Conjunctivae are normal.  Head: Atraumatic. Nose: No congestion/rhinnorhea. Mouth/Throat: Mucous membranes are moist.   Neck: No stridor. Painless ROM.  Cardiovascular: Normal rate, regular rhythm. Grossly normal heart sounds.  Good peripheral circulation. Respiratory: Normal respiratory effort.  No retractions. Lungs CTAB. Gastrointestinal: Soft and nontender. No distention. No abdominal bruits. No CVA tenderness. Genitourinary:  Musculoskeletal: No lower extremity tenderness nor edema.  No joint effusions. Neurologic:  Normal speech and language. No gross focal neurologic deficits are appreciated. No facial droop Skin:  Skin is warm, dry and intact. No rash noted. Psychiatric: anxious appearing, tearful  ____________________________________________   LABS (all labs ordered are listed, but only abnormal results are displayed)  Results for orders placed or performed during the hospital encounter of 08/14/17 (from the past 24 hour(s))  Basic metabolic panel     Status: Abnormal   Collection Time: 08/14/17  1:10 PM  Result Value Ref Range   Sodium 138 135 - 145 mmol/L   Potassium 4.2 3.5 - 5.1 mmol/L    Chloride 102 101 - 111 mmol/L   CO2 25 22 - 32 mmol/L   Glucose, Bld 115 (H) 65 - 99 mg/dL   BUN 6 6 - 20 mg/dL   Creatinine, Ser 1.61 0.61 - 1.24 mg/dL   Calcium 09.6 8.9 - 04.5 mg/dL   GFR calc non Af Amer >60 >60 mL/min   GFR calc Af Amer >60 >60 mL/min   Anion gap 11 5 - 15  CBC     Status: Abnormal   Collection Time: 08/14/17  1:10 PM  Result Value Ref Range   WBC 8.8 3.8 - 10.6 K/uL   RBC 5.84 4.40 - 5.90 MIL/uL   Hemoglobin 18.2 (H) 13.0 - 18.0 g/dL   HCT 40.9 (H) 81.1 - 91.4 %   MCV 90.9 80.0 - 100.0 fL   MCH 31.2 26.0 - 34.0 pg   MCHC 34.3 32.0 - 36.0 g/dL   RDW 78.2 95.6 - 21.3 %   Platelets 240 150 - 440 K/uL  Troponin I     Status: None   Collection Time: 08/14/17  1:10 PM  Result Value Ref Range   Troponin I <0.03 <0.03 ng/mL   ____________________________________________  EKG My review and personal interpretation at Time: 12:49   Indication: chest pain  Rate: 102  Rhythm: sinus Axis: normal Other: poor r wave progression, no stemi ____________________________________________  RADIOLOGY  I personally reviewed all radiographic images ordered to evaluate for the above acute complaints and reviewed radiology reports and findings.  These findings were personally discussed with the patient.  Please see medical record for radiology report.  ____________________________________________   PROCEDURES  Procedure(s) performed:  Procedures    Critical Care performed: no ____________________________________________   INITIAL IMPRESSION / ASSESSMENT AND PLAN / ED COURSE  Pertinent labs & imaging results that were available during my care of the patient were reviewed by me and considered in my medical decision making (see chart for details).  DDX: ACS, pericarditis, esophagitis, boerhaaves, pe, dissection, pna, bronchitis, costochondritis   [name] is a 37 y.o. who presents to the ED with chest pain as described above.  Patient with low risk heart  score of 3 based on family history and hypertension and alcohol use.  Initial troponin and EKG are reassuring and nonischemic.  Will repeat troponin to further risk stratify.  Symptoms seem most clinically consistent with stress induced discomfort.  Possible costochondritis.  No evidence of pericarditis.  His abdominal exam is soft and benign.  Not clinically consistent with dissection or PE.  Low risk by wells criteria and perc negative.  Patient reassessed after given Ativan with some improvement in symptoms.  If repeat troponin negative patient will be appropriate for outpatient follow-up with cardiology.  Have discussed with the patient and available family all diagnostics and treatments performed thus far and all questions were answered to the best of my ability. The patient demonstrates understanding and agreement with plan.       ____________________________________________  FINAL CLINICAL IMPRESSION(S) / ED DIAGNOSES  Final diagnoses:  Chest pain, unspecified type  Secondary hypertension      NEW MEDICATIONS STARTED DURING THIS VISIT:  New Prescriptions   AMLODIPINE (NORVASC) 5 MG TABLET    Take 1 tablet (5 mg total) by mouth daily.     Note:  This document was prepared using Dragon voice recognition software and may include unintentional dictation errors.    Willy Eddyobinson, Linetta Regner, MD 08/14/17 [phone]

## 2018-06-27 DIAGNOSIS — F1010 Alcohol abuse, uncomplicated: Secondary | ICD-10-CM | POA: Insufficient documentation

## 2018-12-15 ENCOUNTER — Other Ambulatory Visit: Payer: Self-pay

## 2018-12-15 DIAGNOSIS — Z91030 Bee allergy status: Secondary | ICD-10-CM

## 2018-12-15 DIAGNOSIS — E876 Hypokalemia: Secondary | ICD-10-CM | POA: Diagnosis present

## 2018-12-15 DIAGNOSIS — F10231 Alcohol dependence with withdrawal delirium: Secondary | ICD-10-CM | POA: Diagnosis not present

## 2018-12-15 DIAGNOSIS — I10 Essential (primary) hypertension: Secondary | ICD-10-CM | POA: Diagnosis present

## 2018-12-15 DIAGNOSIS — Z1159 Encounter for screening for other viral diseases: Secondary | ICD-10-CM

## 2018-12-15 DIAGNOSIS — F329 Major depressive disorder, single episode, unspecified: Secondary | ICD-10-CM | POA: Diagnosis present

## 2018-12-15 DIAGNOSIS — Z79899 Other long term (current) drug therapy: Secondary | ICD-10-CM

## 2018-12-15 DIAGNOSIS — F17200 Nicotine dependence, unspecified, uncomplicated: Secondary | ICD-10-CM | POA: Diagnosis present

## 2018-12-15 MED ORDER — LORAZEPAM 2 MG/ML IJ SOLN
INTRAMUSCULAR | Status: AC
  Filled 2018-12-15: qty 1

## 2018-12-15 MED ORDER — LORAZEPAM 2 MG/ML IJ SOLN
2.0000 mg | Freq: Once | INTRAMUSCULAR | Status: AC
  Administered 2018-12-15: 2 mg via INTRAVENOUS

## 2018-12-15 NOTE — ED Triage Notes (Signed)
Pt arrives via ems with complaint of alcohol withdrawal. Last etoh an hour ago. Pt with tremors noted. Pt is sweaty. Pt with 20g iv in right ac from ems.

## 2018-12-16 ENCOUNTER — Inpatient Hospital Stay
Admission: EM | Admit: 2018-12-16 | Discharge: 2018-12-17 | DRG: 897 | Disposition: A | Discharge status: Home / Self Care | Payer: BC Managed Care – PPO | Attending: Internal Medicine | Admitting: Internal Medicine

## 2018-12-16 ENCOUNTER — Encounter: Payer: Self-pay | Admitting: *Deleted

## 2018-12-16 DIAGNOSIS — Z79899 Other long term (current) drug therapy: Secondary | ICD-10-CM | POA: Diagnosis not present

## 2018-12-16 DIAGNOSIS — Z1159 Encounter for screening for other viral diseases: Secondary | ICD-10-CM | POA: Diagnosis not present

## 2018-12-16 DIAGNOSIS — F329 Major depressive disorder, single episode, unspecified: Secondary | ICD-10-CM | POA: Diagnosis present

## 2018-12-16 DIAGNOSIS — E876 Hypokalemia: Secondary | ICD-10-CM | POA: Diagnosis present

## 2018-12-16 DIAGNOSIS — F10231 Alcohol dependence with withdrawal delirium: Secondary | ICD-10-CM

## 2018-12-16 DIAGNOSIS — Z91030 Bee allergy status: Secondary | ICD-10-CM | POA: Diagnosis not present

## 2018-12-16 DIAGNOSIS — F1010 Alcohol abuse, uncomplicated: Secondary | ICD-10-CM

## 2018-12-16 DIAGNOSIS — F17200 Nicotine dependence, unspecified, uncomplicated: Secondary | ICD-10-CM | POA: Diagnosis present

## 2018-12-16 DIAGNOSIS — F10931 Alcohol use, unspecified with withdrawal delirium: Secondary | ICD-10-CM

## 2018-12-16 DIAGNOSIS — I10 Essential (primary) hypertension: Secondary | ICD-10-CM | POA: Diagnosis present

## 2018-12-16 LAB — CBC WITH DIFFERENTIAL/PLATELET
Abs Immature Granulocytes: 0.06 10*3/uL (ref 0.00–0.07)
Basophils Absolute: 0.1 10*3/uL (ref 0.0–0.1)
Basophils Relative: 1 %
Eosinophils Absolute: 0.1 10*3/uL (ref 0.0–0.5)
Eosinophils Relative: 1 %
HCT: 46.4 % (ref 39.0–52.0)
Hemoglobin: 16.4 g/dL (ref 13.0–17.0)
Immature Granulocytes: 1 %
Lymphocytes Relative: 13 %
Lymphs Abs: 1.3 10*3/uL (ref 0.7–4.0)
MCH: 32.6 pg (ref 26.0–34.0)
MCHC: 35.3 g/dL (ref 30.0–36.0)
MCV: 92.2 fL (ref 80.0–100.0)
Monocytes Absolute: 1 10*3/uL (ref 0.1–1.0)
Monocytes Relative: 9 %
Neutro Abs: 7.8 10*3/uL — ABNORMAL HIGH (ref 1.7–7.7)
Neutrophils Relative %: 75 %
Platelets: 125 10*3/uL — ABNORMAL LOW (ref 150–400)
RBC: 5.03 MIL/uL (ref 4.22–5.81)
RDW: 11.9 % (ref 11.5–15.5)
WBC: 10.3 10*3/uL (ref 4.0–10.5)
nRBC: 0 % (ref 0.0–0.2)

## 2018-12-16 LAB — COMPREHENSIVE METABOLIC PANEL
ALT: 184 U/L — ABNORMAL HIGH (ref 0–44)
ALT: 186 U/L — ABNORMAL HIGH (ref 0–44)
AST: 130 U/L — ABNORMAL HIGH (ref 15–41)
AST: 163 U/L — ABNORMAL HIGH (ref 15–41)
Albumin: 3.7 g/dL (ref 3.5–5.0)
Albumin: 3.6 g/dL (ref 3.5–5.0)
Alkaline Phosphatase: 84 U/L (ref 38–126)
Alkaline Phosphatase: 81 U/L (ref 38–126)
Anion gap: 10 (ref 5–15)
Anion gap: 8 (ref 5–15)
BUN: <5 mg/dL — ABNORMAL LOW (ref 6–20)
BUN: <5 mg/dL — ABNORMAL LOW (ref 6–20)
CO2: 26 mmol/L (ref 22–32)
CO2: 25 mmol/L (ref 22–32)
Calcium: 8.7 mg/dL — ABNORMAL LOW (ref 8.9–10.3)
Calcium: 8.5 mg/dL — ABNORMAL LOW (ref 8.9–10.3)
Chloride: 99 mmol/L (ref 98–111)
Chloride: 105 mmol/L (ref 98–111)
Creatinine, Ser: 0.55 mg/dL — ABNORMAL LOW (ref 0.61–1.24)
Creatinine, Ser: 0.6 mg/dL — ABNORMAL LOW (ref 0.61–1.24)
GFR calc Af Amer: >60 mL/min (ref >60)
GFR calc Af Amer: >60 mL/min (ref >60)
GFR calc non Af Amer: >60 mL/min (ref >60)
GFR calc non Af Amer: >60 mL/min (ref >60)
Glucose, Bld: 110 mg/dL — ABNORMAL HIGH (ref 70–99)
Glucose, Bld: 103 mg/dL — ABNORMAL HIGH (ref 70–99)
Potassium: 3.4 mmol/L — ABNORMAL LOW (ref 3.5–5.1)
Potassium: 3.9 mmol/L (ref 3.5–5.1)
Sodium: 135 mmol/L (ref 135–145)
Sodium: 138 mmol/L (ref 135–145)
Total Bilirubin: 2 mg/dL — ABNORMAL HIGH (ref 0.3–1.2)
Total Bilirubin: 2.5 mg/dL — ABNORMAL HIGH (ref 0.3–1.2)
Total Protein: 6.4 g/dL — ABNORMAL LOW (ref 6.5–8.1)
Total Protein: 6.5 g/dL (ref 6.5–8.1)

## 2018-12-16 LAB — CBC
HCT: 44.3 % (ref 39.0–52.0)
Hemoglobin: 15.4 g/dL (ref 13.0–17.0)
MCH: 32.6 pg (ref 26.0–34.0)
MCHC: 34.8 g/dL (ref 30.0–36.0)
MCV: 93.7 fL (ref 80.0–100.0)
Platelets: 115 10*3/uL — ABNORMAL LOW (ref 150–400)
RBC: 4.73 MIL/uL (ref 4.22–5.81)
RDW: 12.1 % (ref 11.5–15.5)
WBC: 7.5 10*3/uL (ref 4.0–10.5)
nRBC: 0 % (ref 0.0–0.2)

## 2018-12-16 LAB — SARS CORONAVIRUS 2 BY RT PCR (HOSPITAL ORDER, PERFORMED IN ~~LOC~~ HOSPITAL LAB): SARS Coronavirus 2: NEGATIVE

## 2018-12-16 LAB — MAGNESIUM
Magnesium: 1.4 mg/dL — ABNORMAL LOW (ref 1.7–2.4)
Magnesium: 2.3 mg/dL (ref 1.7–2.4)

## 2018-12-16 LAB — ETHANOL: Alcohol, Ethyl (B): <10 mg/dL (ref <10)

## 2018-12-16 MED ORDER — LORAZEPAM 1 MG PO TABS
1.0000 mg | ORAL_TABLET | Freq: Four times a day (QID) | ORAL | Status: DC | PRN
  Administered 2018-12-16 – 2018-12-17 (×3): 1 mg via ORAL
  Filled 2018-12-16 (×3): qty 1

## 2018-12-16 MED ORDER — CLONIDINE HCL 0.1 MG PO TABS: 0.1000 mg | ORAL_TABLET | Freq: Two times a day (BID) | ORAL | Status: DC

## 2018-12-16 MED ORDER — VITAMIN B-1 100 MG PO TABS
100.0000 mg | ORAL_TABLET | Freq: Every day | ORAL | Status: DC
  Administered 2018-12-16 – 2018-12-17 (×2): 100 mg via ORAL
  Filled 2018-12-16 (×2): qty 1

## 2018-12-16 MED ORDER — CLONIDINE HCL 0.1 MG PO TABS: 0.1000 mg | ORAL_TABLET | Freq: Every day | ORAL | Status: DC

## 2018-12-16 MED ORDER — ONDANSETRON HCL 4 MG/2ML IJ SOLN
4.0000 mg | Freq: Once | INTRAMUSCULAR | Status: AC
  Administered 2018-12-16: 4 mg via INTRAVENOUS
  Filled 2018-12-16: qty 2

## 2018-12-16 MED ORDER — NICOTINE 21 MG/24HR TD PT24
21.0000 mg | MEDICATED_PATCH | Freq: Every day | TRANSDERMAL | Status: DC
  Administered 2018-12-16 – 2018-12-17 (×2): 21 mg via TRANSDERMAL
  Filled 2018-12-16 (×2): qty 1

## 2018-12-16 MED ORDER — CLONIDINE HCL 0.1 MG PO TABS: 0.1000 mg | ORAL_TABLET | Freq: Four times a day (QID) | ORAL | Status: DC

## 2018-12-16 MED ORDER — CLONIDINE HCL 0.1 MG PO TABS
0.1000 mg | ORAL_TABLET | Freq: Four times a day (QID) | ORAL | Status: DC
  Administered 2018-12-16 – 2018-12-17 (×6): 0.1 mg via ORAL
  Filled 2018-12-16 (×6): qty 1

## 2018-12-16 MED ORDER — ADULT MULTIVITAMIN W/MINERALS CH
1.0000 | ORAL_TABLET | Freq: Every day | ORAL | Status: DC
  Administered 2018-12-16 – 2018-12-17 (×2): 1 via ORAL
  Filled 2018-12-16 (×2): qty 1

## 2018-12-16 MED ORDER — LORAZEPAM 2 MG/ML IJ SOLN: 1.0000 mg | Freq: Four times a day (QID) | INTRAMUSCULAR | Status: DC | PRN

## 2018-12-16 MED ORDER — ONDANSETRON HCL 4 MG PO TABS: 4.0000 mg | ORAL_TABLET | Freq: Four times a day (QID) | ORAL | Status: DC | PRN

## 2018-12-16 MED ORDER — DIPHENHYDRAMINE HCL 25 MG PO CAPS: 100.0000 mg | ORAL_CAPSULE | Freq: Four times a day (QID) | ORAL | Status: DC | PRN

## 2018-12-16 MED ORDER — ONDANSETRON HCL 4 MG/2ML IJ SOLN: 4.0000 mg | Freq: Four times a day (QID) | INTRAMUSCULAR | Status: DC | PRN

## 2018-12-16 MED ORDER — ACETAMINOPHEN 650 MG RE SUPP: 650.0000 mg | Freq: Four times a day (QID) | RECTAL | Status: DC | PRN

## 2018-12-16 MED ORDER — ENOXAPARIN SODIUM 40 MG/0.4ML ~~LOC~~ SOLN
40.0000 mg | SUBCUTANEOUS | Status: DC
  Administered 2018-12-16 – 2018-12-17 (×2): 40 mg via SUBCUTANEOUS
  Filled 2018-12-16 (×2): qty 0.4

## 2018-12-16 MED ORDER — LISINOPRIL 10 MG PO TABS
10.0000 mg | ORAL_TABLET | Freq: Every day | ORAL | Status: DC
  Administered 2018-12-16 – 2018-12-17 (×2): 10 mg via ORAL
  Filled 2018-12-16 (×2): qty 1

## 2018-12-16 MED ORDER — MAGNESIUM SULFATE 2 GM/50ML IV SOLN
2.0000 g | Freq: Once | INTRAVENOUS | Status: AC
  Administered 2018-12-16: 02:00:00 2 g via INTRAVENOUS
  Filled 2018-12-16: qty 50

## 2018-12-16 MED ORDER — ESCITALOPRAM OXALATE 10 MG PO TABS
20.0000 mg | ORAL_TABLET | Freq: Every day | ORAL | Status: DC
  Administered 2018-12-16 – 2018-12-17 (×2): 20 mg via ORAL
  Filled 2018-12-16 (×2): qty 2

## 2018-12-16 MED ORDER — THIAMINE HCL 100 MG/ML IJ SOLN
Freq: Once | INTRAVENOUS | Status: AC
  Administered 2018-12-16: 03:00:00 via INTRAVENOUS
  Filled 2018-12-16: qty 1000

## 2018-12-16 MED ORDER — AMLODIPINE BESYLATE 5 MG PO TABS
5.0000 mg | ORAL_TABLET | Freq: Every day | ORAL | Status: DC
  Administered 2018-12-16 – 2018-12-17 (×2): 5 mg via ORAL
  Filled 2018-12-16 (×2): qty 1

## 2018-12-16 MED ORDER — ACETAMINOPHEN 325 MG PO TABS: 650.0000 mg | ORAL_TABLET | Freq: Four times a day (QID) | ORAL | Status: DC | PRN

## 2018-12-16 MED ORDER — SODIUM CHLORIDE 0.9 % IV SOLN
INTRAVENOUS | Status: DC
  Administered 2018-12-16 – 2018-12-17 (×3): via INTRAVENOUS

## 2018-12-16 MED ORDER — LORAZEPAM 1 MG PO TABS: 1.0000 mg | ORAL_TABLET | ORAL | Status: DC | PRN

## 2018-12-16 MED ORDER — THIAMINE HCL 100 MG/ML IJ SOLN: 100.0000 mg | Freq: Every day | INTRAMUSCULAR | Status: DC

## 2018-12-16 MED ORDER — TRAZODONE HCL 50 MG PO TABS
25.0000 mg | ORAL_TABLET | Freq: Every evening | ORAL | Status: DC | PRN
  Administered 2018-12-16: 25 mg via ORAL
  Filled 2018-12-16: qty 1

## 2018-12-16 MED ORDER — ONDANSETRON HCL 4 MG PO TABS: 4.0000 mg | ORAL_TABLET | Freq: Three times a day (TID) | ORAL | Status: DC | PRN

## 2018-12-16 MED ORDER — MAGNESIUM HYDROXIDE 400 MG/5ML PO SUSP: 30.0000 mL | Freq: Every day | ORAL | Status: DC | PRN

## 2018-12-16 MED ORDER — CHLORDIAZEPOXIDE HCL 25 MG PO CAPS
50.0000 mg | ORAL_CAPSULE | Freq: Once | ORAL | Status: AC
  Administered 2018-12-16: 50 mg via ORAL
  Filled 2018-12-16: qty 2

## 2018-12-16 MED ORDER — FOLIC ACID 1 MG PO TABS
1.0000 mg | ORAL_TABLET | Freq: Every day | ORAL | Status: DC
  Administered 2018-12-16 – 2018-12-17 (×2): 1 mg via ORAL
  Filled 2018-12-16 (×2): qty 1

## 2018-12-16 MED ORDER — POTASSIUM CHLORIDE CRYS ER 20 MEQ PO TBCR
40.0000 meq | EXTENDED_RELEASE_TABLET | Freq: Once | ORAL | Status: AC
  Administered 2018-12-16: 40 meq via ORAL
  Filled 2018-12-16: qty 2

## 2018-12-16 NOTE — ED Notes (Signed)
Pt resting comfortably when this RN entered room. This RN will continue to monitor.

## 2018-12-16 NOTE — H&P (Signed)
[facility] [place] [facility]   PATIENT NAME: Ryan Faulkner    MR#:  [id number]  DATE OF BIRTH:  09/22/1980  DATE OF ADMISSION:  12/16/2018  PRIMARY CARE PHYSICIAN: Patient, No Pcp Per   REQUESTING/REFERRING PHYSICIAN: Cecil CobbsVeronese, Caroline, MD  CHIEF COMPLAINT:   Chief Complaint  Patient presents with  . Delirium Tremens (DTS)  Tremors and shaking  HISTORY OF PRESENT ILLNESS:  Ryan GreenerBrandon Johann  is a 38 y.o. Caucasian male with a known history of hypertension, tobacco and alcohol abuse, who presented to the emergency room with acute onset of severe tremors with associated shaking and jitteriness.  He was concerned he was going to have seizure.  He usually drinks about 8 beers per day and has been slowing down recently as his usual is about 24 beer a day.  His last alcoholic drink was at 2 AM yesterday.  He denied any nausea or vomiting or abdominal pain.  No chest pain or dyspnea or palpitations.  No fever or chills.  No recent sick exposures.  Upon presentation to the emergency room, blood pressure was 136/94 with otherwise normal vital signs.  Labs were remarkable for mild hypokalemia of 3.4 and hypomagnesemia of 1.4 with hemoconcentration and alcohol level less than 10.  The patient was given p.o. Librium in the emergency room as well as IV Ativan and Zofran banana bag.  His potassium and magnesium were replaced.  He will be admitted to a medical monitored bed for further evaluation and management. PAST MEDICAL HISTORY:   Past Medical History:  Diagnosis Date  . 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 Hypertension   Alcohol abuse  PAST SURGICAL HISTORY:   Past Surgical History:  Procedure Laterality Date  . TONSILLECTOMY      SOCIAL HISTORY:   Social History   Tobacco Use  . Smoking status: Current Every Day Smoker  . Smokeless tobacco: Never Used  Substance Use Topics  . Alcohol use: Yes    Alcohol/week: 24.0 standard drinks    Types: 24 Cans of beer per week    FAMILY HISTORY:  No family history on file.  DRUG ALLERGIES:  No Known Allergies  REVIEW OF SYSTEMS:   ROS As per history of present illness. All pertinent systems were reviewed above. Constitutional,  HEENT, cardiovascular, respiratory, GI, GU, musculoskeletal, neuro, psychiatric, endocrine,  integumentary and hematologic systems were reviewed and are otherwise  negative/unremarkable except for positive findings mentioned above in the HPI.   MEDICATIONS AT HOME:   Prior to Admission medications   Medication Sig Start Date End Date Taking? Authorizing Provider  amLODipine (NORVASC) 5 MG tablet Take 1 tablet (5 mg total) by mouth daily. 08/14/17 12/16/18 Yes Willy Eddyobinson, Patrick, MD  escitalopram (LEXAPRO) 20 MG tablet Take 1 tablet by mouth daily. 06/27/18  Yes [provider]  lisinopril (ZESTRIL) 10 MG tablet Take 1 tablet by mouth daily. 04/16/18  Yes [provider]  diphenhydrAMINE (BENADRYL) 25 mg capsule Take 100 mg by mouth every 6 (six) hours as needed for allergies.    [provider]  EPINEPHrine (EPIPEN 2-PAK) 0.3 mg/0.3 mL IJ SOAJ injection Inject 0.3 mLs (0.3 mg total) into the muscle once. Patient not taking: Reported on 12/16/2018 01/02/15   Governor RooksLord, Rebecca, MD  predniSONE (DELTASONE) 10 MG tablet Take 1 tablet (10 mg total) by mouth daily. Take 40 mg by mouth for 2 days Take 30 mg by mouth for 2 days  Takes 20 mg by mouth for 2 days Take 10 mg by mouth for 2 days Patient not taking: Reported on 08/14/2017 01/02/15   Lisa Roca, MD      VITAL SIGNS:  Blood pressure (!) 147/103, pulse 87, temperature 98.8 F (37.1 C), temperature source Oral, resp. rate (!) 23, height 5\' 10"  (1.778 m), weight 95.3 kg, SpO2 95 %.  PHYSICAL EXAMINATION:  Physical Exam  GENERAL:  38 y.o.-year-old Caucasian male patient lying in the bed with no acute distress.  EYES: Pupils equal, round, reactive to light and accommodation. No  scleral icterus. Extraocular muscles intact.  HEENT: Head atraumatic, normocephalic. Oropharynx and nasopharynx clear.  NECK:  Supple, no jugular venous distention. No thyroid enlargement, no tenderness.  LUNGS: Normal breath sounds bilaterally, no wheezing, rales,rhonchi or crepitation. No use of accessory muscles of respiration.  CARDIOVASCULAR: Regular rate and rhythm, S1, S2 normal. No murmurs, rubs, or gallops.  ABDOMEN: Soft, nondistended, nontender. Bowel sounds present. No organomegaly or mass.  EXTREMITIES: No pedal edema, cyanosis, or clubbing.  NEUROLOGIC: Cranial nerves II through XII are intact. Muscle strength 5/5 in all extremities. Sensation intact. Gait not checked.  He has tremors worsening with movement. PSYCHIATRIC: The patient is alert and oriented x 3.  Normal affect and good eye contact. SKIN: No obvious rash, lesion, or ulcer.   LABORATORY PANEL:   CBC Recent Labs  Lab 12/15/18 2353  WBC 10.3  HGB 16.4  HCT 46.4  PLT 125*   ------------------------------------------------------------------------------------------------------------------  Chemistries  Recent Labs  Lab 12/16/18 0115  NA 135  K 3.4*  CL 99  CO2 26  GLUCOSE 110*  BUN <5*  CREATININE 0.55*  CALCIUM 8.7*  MG 1.4*  AST 130*  ALT 184*  ALKPHOS 84  BILITOT 2.0*   ------------------------------------------------------------------------------------------------------------------  Cardiac Enzymes No results for input(s): TROPONINI in the last 168 hours. ------------------------------------------------------------------------------------------------------------------  RADIOLOGY:  No results found.    IMPRESSION AND PLAN:   1.  Delirium tremens associated with alcohol withdrawal.  The patient will be admitted to a medical monitored bed.  We will place him on CIWA protocol.  PRN IV Ativan will be provided every hour as needed.  He was counseled for cessation.  2.  Hypokalemia.   Potassium was replaced.  It will be followed.  3.  Hypomagnesemia.  Magnesium was replaced and will be followed.  4.  Ongoing tobacco abuse.  I counseled him for cessation and he will receive further counseling here.  5.  Hypertension.  We will continue his amlodipine.  6.  Depression.  Lexapro and Wellbutrin XL will be resumed.  7.  DVT prophylaxis.  Subcutaneous Lovenox  All the records are reviewed and case discussed with ED provider. The plan of care was discussed in details with the patient (and family). I answered all questions. The patient agreed to proceed with the above mentioned plan. Further management will depend upon hospital course.   CODE STATUS: Full code  TOTAL TIME TAKING CARE OF THIS PATIENT: 45 minutes.    Christel Mormon M.D on 12/16/2018 at 4:05 AM  Pager - [phone]  After 6pm go to [email or web address] - Proofreader  [facility]  Office  [phone]  CC: Primary care physician; Patient, No Pcp Per   Note: This dictation was prepared with Dragon dictation along with smaller phrase technology. Any transcriptional errors that result from this process are unintentional.

## 2018-12-16 NOTE — ED Notes (Signed)
This RN contacted pharmacy about missing medication. Pharmacy informed this RN that they would have to verify medication and make it before sending.

## 2018-12-16 NOTE — ED Notes (Signed)
.. ED TO INPATIENT HANDOFF REPORT  ED Nurse Name and Phone #: Pattricia Bossnnie [phone]  S Name/Age/Gender Ryan Faulkner 38 y.o. male Room/Bed: ED01A/ED01A  Code Status   Code Status: Full Code  Home/SNF/Other Home Patient oriented to: self, place, time and situation Is this baseline? Yes   Triage Complete: Triage complete  Chief Complaint [facility] - Alcohol withdrawals  Triage Note Pt arrives via ems with complaint of alcohol withdrawal. Last etoh an hour ago. Pt with tremors noted. Pt is sweaty. Pt with 20g iv in right ac from ems.    Allergies No Known Allergies  Level of Care/Admitting Diagnosis ED Disposition    ED Disposition Condition Comment   Admit  Hospital Area: [facility] [[id number]]  Level of Care: Med-Surg [16]  Covid Evaluation: Screening Protocol (No Symptoms)  Diagnosis: Delirium tremens [facility](HCC) [[id number]][[id number]]  Admitting Physician: Hannah BeatMANSY, JAN A [[id number]]  Attending Physician: Hannah BeatMANSY, JAN A [[id number]]  Estimated length of stay: past midnight tomorrow  Certification:: I certify this patient will need inpatient services for at least 2 midnights  PT Class (Do Not Modify): Inpatient [101]  PT Acc Code (Do Not Modify): Private [1]       B Medical/Surgery History Past Medical History:  Diagnosis Date  . 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 Hypertension    Past Surgical History:  Procedure Laterality Date  . TONSILLECTOMY       A IV Location/Drains/Wounds Patient Lines/Drains/Airways Status   Active Line/Drains/Airways    Name:   Placement date:   Placement time:   Site:   Days:   Peripheral IV 12/15/18 Right Antecubital   12/15/18    -    Antecubital   1          Intake/Output Last 24 hours No intake or output data in the 24 hours ending 12/16/18 0252  Labs/Imaging Results for orders placed or performed during the hospital encounter of 12/16/18 (from the past 48 hour(s))  CBC with Differential     Status: Abnormal    Collection Time: 12/15/18 11:53 PM  Result Value Ref Range   WBC 10.3 4.0 - 10.5 K/uL   RBC 5.03 4.22 - 5.81 MIL/uL   Hemoglobin 16.4 13.0 - 17.0 g/dL   HCT 08.646.4 57.839.0 - 46.952.0 %   MCV 92.2 80.0 - 100.0 fL   MCH 32.6 26.0 - 34.0 pg   MCHC 35.3 30.0 - 36.0 g/dL   RDW 62.911.9 52.811.5 - 41.315.5 %   Platelets 125 (L) 150 - 400 K/uL   nRBC 0.0 0.0 - 0.2 %   Neutrophils Relative % 75 %   Neutro Abs 7.8 (H) 1.7 - 7.7 K/uL   Lymphocytes Relative 13 %   Lymphs Abs 1.3 0.7 - 4.0 K/uL   Monocytes Relative 9 %   Monocytes Absolute 1.0 0.1 - 1.0 K/uL   Eosinophils Relative 1 %   Eosinophils Absolute 0.1 0.0 - 0.5 K/uL   Basophils Relative 1 %   Basophils Absolute 0.1 0.0 - 0.1 K/uL   Immature Granulocytes 1 %   Abs Immature Granulocytes 0.06 0.00 - 0.07 K/uL    Comment: Performed at [facility], [address] [place], [place] [address]  Ethanol     Status: None   Collection Time: 12/16/18  1:15 AM  Result Value Ref Range   Alcohol, Ethyl (B) <10 <10 mg/dL    Comment: (NOTE) Lowest detectable limit for serum alcohol is  10 mg/dL. For medical purposes only. Performed at [facility], [address] [place], [place] [address]   Comprehensive metabolic panel     Status: Abnormal   Collection Time: 12/16/18  1:15 AM  Result Value Ref Range   Sodium 135 135 - 145 mmol/L   Potassium 3.4 (L) 3.5 - 5.1 mmol/L   Chloride 99 98 - 111 mmol/L   CO2 26 22 - 32 mmol/L   Glucose, Bld 110 (H) 70 - 99 mg/dL   BUN <5 (L) 6 - 20 mg/dL   Creatinine, Ser 1.910.55 (L) 0.61 - 1.24 mg/dL   Calcium 8.7 (L) 8.9 - 10.3 mg/dL   Total Protein 6.4 (L) 6.5 - 8.1 g/dL   Albumin 3.7 3.5 - 5.0 g/dL   AST 478130 (H) 15 - 41 U/L   ALT 184 (H) 0 - 44 U/L   Alkaline Phosphatase 84 38 - 126 U/L   Total Bilirubin 2.0 (H) 0.3 - 1.2 mg/dL   GFR calc non Af Amer >60 >60 mL/min   GFR calc Af Amer >60 >60 mL/min   Anion gap 10 5 - 15    Comment: Performed at [facility], [address]  [place], [place] [address]  Magnesium     Status: Abnormal   Collection Time: 12/16/18  1:15 AM  Result Value Ref Range   Magnesium 1.4 (L) 1.7 - 2.4 mg/dL    Comment: Performed at [facility], [address] [place], [place] [address]   No results found.  Pending Labs Unresulted Labs (From admission, onward)    Start     Ordered   12/23/18 0500  Creatinine, serum  (enoxaparin (LOVENOX)    CrCl >/= 30 ml/min)  Weekly,   STAT    Comments: while on enoxaparin therapy    12/16/18 0234   12/16/18 0500  Comprehensive metabolic panel  Tomorrow morning,   STAT     12/16/18 0234   12/16/18 0500  CBC  Tomorrow morning,   STAT     12/16/18 0234   12/16/18 0231  HIV antibody (Routine Testing)  Once,   STAT     12/16/18 0234   12/16/18 0141  SARS Coronavirus 2 (CEPHEID - Performed in [facility] hospital lab), Hosp Order  (Asymptomatic Patients Labs)  Once,   STAT    Question:  Rule Out  Answer:  Yes   12/16/18 0140          Vitals/Pain Today's Vitals   12/16/18 0055 12/16/18 0100 12/16/18 0109 12/16/18 0200  BP: (!) 157/106 (!) 156/108 (!) 156/108 (!) 143/100  Pulse: (!) 102 (!) 105 98 91  Resp: 17 18  19   Temp:      TempSrc:      SpO2: 99% 94%  97%  Weight:      Height:      PainSc:        Isolation Precautions No active isolations  Medications Medications  cloNIDine (CATAPRES) tablet 0.1 mg (0.1 mg Oral Given 12/16/18 0106)    Followed by  cloNIDine (CATAPRES) tablet 0.1 mg (has no administration in time range)    Followed by  cloNIDine (CATAPRES) tablet 0.1 mg (has no administration in time range)  magnesium sulfate IVPB 2 g 50 mL (2 g Intravenous New Bag/Given 12/16/18 0225)  amLODipine (NORVASC) tablet 5 mg (has no administration in time range)  lisinopril (ZESTRIL) tablet 10 mg (has no administration in time range)  escitalopram (LEXAPRO) tablet 20 mg (has  no administration in time range)  diphenhydrAMINE (BENADRYL) capsule 100 mg (has no administration in  time range)  LORazepam (ATIVAN) tablet 1 mg (has no administration in time range)    Or  LORazepam (ATIVAN) injection 1 mg (has no administration in time range)  thiamine (VITAMIN B-1) tablet 100 mg (has no administration in time range)    Or  thiamine (B-1) injection 100 mg (has no administration in time range)  folic acid (FOLVITE) tablet 1 mg (has no administration in time range)  multivitamin with minerals tablet 1 tablet (has no administration in time range)  enoxaparin (LOVENOX) injection 40 mg (has no administration in time range)  0.9 %  sodium chloride infusion (has no administration in time range)  acetaminophen (TYLENOL) tablet 650 mg (has no administration in time range)    Or  acetaminophen (TYLENOL) suppository 650 mg (has no administration in time range)  traZODone (DESYREL) tablet 25 mg (has no administration in time range)  magnesium hydroxide (MILK OF MAGNESIA) suspension 30 mL (has no administration in time range)  ondansetron (ZOFRAN) tablet 4 mg (has no administration in time range)    Or  ondansetron (ZOFRAN) injection 4 mg (has no administration in time range)  LORazepam (ATIVAN) injection 2 mg (2 mg Intravenous Given 12/15/18 2353)  sodium chloride 0.9 % 1,000 mL with thiamine 798 mg, folic acid 1 mg, multivitamins adult 10 mL infusion ( Intravenous New Bag/Given 12/16/18 0251)  ondansetron (ZOFRAN) injection 4 mg (4 mg Intravenous Given 12/16/18 0107)  chlordiazePOXIDE (LIBRIUM) capsule 50 mg (50 mg Oral Given 12/16/18 0106)  potassium chloride SA (K-DUR) CR tablet 40 mEq (40 mEq Oral Given 12/16/18 0234)    Mobility walks Moderate fall risk   Focused Assessments Neuro Assessment Handoff:  Swallow screen pass? Yes          Neuro Assessment:   Neuro Checks:      Last Documented NIHSS Modified Score:   Has TPA been given? No If patient is a Neuro Trauma and patient is going to OR before floor call report to [place] nurse: [phone] or  [phone]     R Recommendations: See Admitting Provider Note  Report given to:   Additional Notes:

## 2018-12-16 NOTE — ED Provider Notes (Addendum)
[facility] Emergency Department Provider Note  ____________________________________________  Time seen: Approximately 1:38 AM  I have reviewed the triage vital signs and the nursing notes.   HISTORY  Chief Complaint Delirium Tremens (DTS)   HPI Ryan Faulkner is a 38 y.o. male with a history of hypertension alcohol abuse who presents for evaluation of DTs.  Patient reports that he has been drinking 8 beers a day for several years.  Over the last 2 weeks he has been drinking 24 beers a day.  His last drink was at 2 AM yesterday.  Patient reports that just prior to arrival he started having severe shakes, he was afraid he was going to have a seizure, vomiting and diarrhea.  He drank a beer to try to help improve his symptoms and called 911.  Patient received Ativan in route and feels improved at this time.  Patient reports that he has not been sober for more than a few days for years. No prior h/o of DTs or withdrawal seizures.  He denies chest pain or shortness of breath, cough or fever.  Past Medical History:  Diagnosis Date  . 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 Hypertension     Patient Active Problem List   Diagnosis Date Noted  . Delirium tremens (HCC) 12/16/2018    Past Surgical History:  Procedure Laterality Date  . TONSILLECTOMY      Prior to Admission medications   Medication Sig Start Date End Date Taking? Authorizing Provider  amLODipine (NORVASC) 5 MG tablet Take 1 tablet (5 mg total) by mouth daily. 08/14/17 12/16/18 Yes Willy Eddyobinson, Patrick, MD  escitalopram (LEXAPRO) 20 MG tablet Take 1 tablet by mouth daily. 06/27/18  Yes [provider]  lisinopril (ZESTRIL) 10 MG tablet Take 1 tablet by mouth daily. 04/16/18  Yes [provider]  diphenhydrAMINE (BENADRYL) 25 mg capsule Take 100 mg by mouth every 6 (six) hours as needed for allergies.    [provider]  EPINEPHrine (EPIPEN 2-PAK) 0.3 mg/0.3  mL IJ SOAJ injection Inject 0.3 mLs (0.3 mg total) into the muscle once. Patient not taking: Reported on 12/16/2018 01/02/15   Governor RooksLord, Rebecca, MD  predniSONE (DELTASONE) 10 MG tablet Take 1 tablet (10 mg total) by mouth daily. Take 40 mg by mouth for 2 days Take 30 mg by mouth for 2 days Takes 20 mg by mouth for 2 days Take 10 mg by mouth for 2 days Patient not taking: Reported on 08/14/2017 01/02/15   Governor RooksLord, Rebecca, MD    Allergies Patient has no known allergies.  No family history on file.  Social History Social History   Tobacco Use  . Smoking status: Current Every Day Smoker  . Smokeless tobacco: Never Used  Substance Use Topics  . Alcohol use: Yes    Alcohol/week: 24.0 standard drinks    Types: 24 Cans of beer per week  . Drug use: No    Review of Systems  Constitutional: Negative for fever. Eyes: Negative for visual changes. ENT: Negative for sore throat. Neck: No neck pain  Cardiovascular: Negative for chest pain. Respiratory: Negative for shortness of breath. Gastrointestinal: Negative for abdominal pain. + vomiting and diarrhea. Genitourinary: Negative for dysuria. Musculoskeletal: Negative for back pain. Skin: Negative for rash. Neurological: Negative for headaches, weakness or numbness. Psych: No SI or HI. + tremors  ____________________________________________   PHYSICAL EXAM:  VITAL SIGNS: ED Triage Vitals [12/15/18 2338]  Enc Vitals Group  BP (!) 155/95     Pulse Rate (!) 111     Resp 20     Temp 98.8 F (37.1 C)     Temp Source Oral     SpO2 97 %     Weight 210 lb (95.3 kg)     Height 5\' 10"  (1.778 m)     Head Circumference      Peak Flow      Pain Score 0     Pain Loc      Pain Edu?      Excl. in [place]?     Constitutional: Alert and oriented, looks anxious, tremors noted.  HEENT:      Head: Normocephalic and atraumatic.         Eyes: Conjunctivae are normal. Sclera is non-icteric.       Mouth/Throat: Mucous membranes are moist.        Neck: Supple with no signs of meningismus. Cardiovascular: Tachycardic with regular rhythm. No murmurs, gallops, or rubs.  Respiratory: Normal respiratory effort. Lungs are clear to auscultation bilaterally. No wheezes, crackles, or rhonchi.  Gastrointestinal: Soft, non tender, and non distended with positive bowel sounds. No rebound or guarding. Musculoskeletal: Nontender with normal range of motion in all extremities. No edema, cyanosis, or erythema of extremities. Neurologic: Normal speech and language. Face is symmetric. Moving all extremities. No gross focal neurologic deficits are appreciated. Skin: Skin is warm, dry and intact. No rash noted. Psychiatric: Mood and affect are normal. Speech and behavior are normal.  ____________________________________________   LABS (all labs ordered are listed, but only abnormal results are displayed)  Labs Reviewed  CBC WITH DIFFERENTIAL/PLATELET - Abnormal; Notable for the following components:      Result Value   Platelets 125 (*)    Neutro Abs 7.8 (*)    All other components within normal limits  COMPREHENSIVE METABOLIC PANEL - Abnormal; Notable for the following components:   Potassium 3.4 (*)    Glucose, Bld 110 (*)    BUN <5 (*)    Creatinine, Ser 0.55 (*)    Calcium 8.7 (*)    Total Protein 6.4 (*)    AST 130 (*)    ALT 184 (*)    Total Bilirubin 2.0 (*)    All other components within normal limits  MAGNESIUM - Abnormal; Notable for the following components:   Magnesium 1.4 (*)    All other components within normal limits  SARS CORONAVIRUS 2 (HOSPITAL ORDER, [place])  ETHANOL  HIV ANTIBODY (ROUTINE TESTING W REFLEX)  COMPREHENSIVE METABOLIC PANEL  CBC   ____________________________________________  EKG  ED ECG REPORT I, Rudene Re, the attending physician, personally viewed and interpreted this ECG.  Normal sinus rhythm, rate of 88, normal intervals, normal axis, anterior Q waves, no  ST elevations or depressions.  Unchanged from prior. ____________________________________________  RADIOLOGY  none ____________________________________________   PROCEDURES  Procedure(s) performed: None Procedures Critical Care performed: yes  CRITICAL CARE Performed by: Rudene Re  ?  Total critical care time: 30 min  Critical care time was exclusive of separately billable procedures and treating other patients.  Critical care was necessary to treat or prevent imminent or life-threatening deterioration.  Critical care was time spent personally by me on the following activities: development of treatment plan with patient and/or surrogate as well as nursing, discussions with consultants, evaluation of patient's response to treatment, examination of patient, obtaining history from patient or surrogate, ordering and performing treatments and  interventions, ordering and review of laboratory studies, ordering and review of radiographic studies, pulse oximetry and re-evaluation of patient's condition.  ____________________________________________   INITIAL IMPRESSION / ASSESSMENT AND PLAN / ED COURSE   38 y.o. male with a history of hypertension alcohol abuse who presents for evaluation of DTs. Patient with tachycardia, hypertension, tremors, anxiety, diaphoresis, vomiting and diarrhea consistent with complicated withdrawal.  Patient was started on CIWA protocol with Ativan PRN. Initial CIWA of 15, received 2mg  of IV ativan. Will start on librium taper. Will give banana bag, clonidine, and zofran. Will monitor on telemetry. Will get labs to rule out alcoholic ketoacidosis, AKI, electrolyte abnormalities.  Anticipate admission.    _________________________ 2:05 AM on 12/16/2018 -----------------------------------------  Mild hypokalemia and hypomagnesemia with no EKG changes.  Undetectable alcohol level.  Slightly elevated LFTs concerning for liver injury from chronic alcohol  abuse.  Patient looks markedly more comfortable after above medications were given.  Will admit to the hospitalist service.   As part of my medical decision making, I reviewed the following data within the electronic MEDICAL RECORD NUMBER Nursing notes reviewed and incorporated, Labs reviewed , Old chart reviewed, Discussed with admitting physician , Notes from prior ED visits and [place] Controlled Substance Database    Pertinent labs & imaging results that were available during my care of the patient were reviewed by me and considered in my medical decision making (see chart for details).    ____________________________________________   FINAL CLINICAL IMPRESSION(S) / ED DIAGNOSES  Final diagnoses:  Delirium tremens (HCC)  Hypomagnesemia  Hypokalemia  Alcohol abuse      NEW MEDICATIONS STARTED DURING THIS VISIT:  ED Discharge Orders    None       Note:  This document was prepared using Dragon voice recognition software and may include unintentional dictation errors.    Don PerkingVeronese, [place], MD 12/16/18 [id number]    Don PerkingVeronese, [place], MD 12/16/18 [phone]

## 2018-12-16 NOTE — Progress Notes (Signed)
1.  Delirium tremens associated with alcohol withdrawal.  The patient will be admitted to a medical monitored bed.  We will place him on CIWA protocol.  PRN IV Ativan will be provided every hour as needed.  He was counseled for cessation.  2.  Hypokalemia.  Potassium was replaced.  It will be followed.  3.  Hypomagnesemia.  Magnesium was replaced and will be followed.  4.  Ongoing tobacco abuse.  I counseled him for cessation and he will receive further counseling here.  5.  Hypertension.  We will continue his amlodipine.  6.  Depression.  Lexapro and Wellbutrin XL will be resumed.  7.  DVT prophylaxis.  Subcutaneous Lovenox  Agree with above

## 2018-12-16 NOTE — ED Notes (Signed)
Pt given urinal at this time. Pt able to use urinal independently at this time.

## 2018-12-16 NOTE — Plan of Care (Addendum)
The patient was given two doses of oral ativan for anxiety and tremors. Eating well. Voiding. The patient had an accident in the bed today. Nicotine patch order for cravings. The patient reports to Smoke 2 1/2 pack of cigarettes a day. The unsteady at times when ambulating.   Problem: Education: Goal: Knowledge of General Education information will improve Description: Including pain rating scale, medication(s)/side effects and non-pharmacologic comfort measures Outcome: Progressing   Problem: Health Behavior/Discharge Planning: Goal: Ability to manage health-related needs will improve Outcome: Progressing   Problem: Clinical Measurements: Goal: Ability to maintain clinical measurements within normal limits will improve Outcome: Progressing Goal: Will remain free from infection Outcome: Progressing Goal: Diagnostic test results will improve Outcome: Progressing Goal: Respiratory complications will improve Outcome: Progressing Goal: Cardiovascular complication will be avoided Outcome: Progressing   Problem: Activity: Goal: Risk for activity intolerance will decrease Outcome: Progressing   Problem: Nutrition: Goal: Adequate nutrition will be maintained Outcome: Progressing   Problem: Coping: Goal: Level of anxiety will decrease Outcome: Progressing   Problem: Elimination: Goal: Will not experience complications related to bowel motility Outcome: Progressing Goal: Will not experience complications related to urinary retention Outcome: Progressing   Problem: Pain Managment: Goal: General experience of comfort will improve Outcome: Progressing   Problem: Safety: Goal: Ability to remain free from injury will improve Outcome: Progressing   Problem: Skin Integrity: Goal: Risk for impaired skin integrity will decrease Outcome: Progressing

## 2018-12-16 NOTE — BH Assessment (Signed)
Assessment Note  Ryan Faulkner is an 38 y.o. male. Ryan Faulkner arrived to the ED by way of EMS due to symptoms of withdrawal.  He denied symptoms of depression.  He reports mild symptoms of anxiety being triggered by his lack of knowledge on what is occurring around him.  He denied having auditory or visual hallucinations.  He denied suicidal or homicidal ideation or intent.  He reports use of alcohol, but denied the use of drugs.  He reports facing stress from a current relationship break up (2 weeks ago), job loss a week ago (Monday), and his grandfather having a stroke on Saturday.   He reports that he is currently drinking a case of beer a day. He is currently seeking treatment for his alcohol, "so I don't freaking die from alcohol". He reports that he has never had any substance abuse treatment prior. Patient reports that he cannot do inpatient treatment at this time due to personal responsibilities.   Diagnosis: Alcohol Abuse  Past Medical History:  Past Medical History:  Diagnosis Date  . 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 Hypertension     Past Surgical History:  Procedure Laterality Date  . TONSILLECTOMY      Family History: No family history on file.  Social History:  reports that he has been smoking. He has never used smokeless tobacco. He reports current alcohol use of about 24.0 standard drinks of alcohol per week. He reports that he does not use drugs.  Additional Social History:  Alcohol / Drug Use History of alcohol / drug use?: Yes Substance #1 Name of Substance 1: Alcohol 1 - Age of First Use: 14 1 - Amount (size/oz): 1 case of beer 1 - Frequency: daily 1 - Last Use / Amount: 12/15/2018  CIWA: CIWA-Ar BP: (!) 143/100 Pulse Rate: 91 Nausea and Vomiting: mild nausea with no vomiting Tactile Disturbances: none Tremor: five Auditory Disturbances: not present Paroxysmal Sweats: three Visual Disturbances: not present Anxiety: mildly  anxious Headache, Fullness in Head: none present Agitation: somewhat more than normal activity Orientation and Clouding of Sensorium: oriented and can do serial additions CIWA-Ar Total: 11 COWS:    Allergies: No Known Allergies  Home Medications: (Not in a hospital admission)   OB/GYN Status:  No LMP for male patient.  General Assessment Data Location of Assessment: [facility] ED TTS Assessment: In system Is this a Tele or Face-to-Face Assessment?: Face-to-Face Is this an Initial Assessment or a Re-assessment for this encounter?: Initial Assessment Patient Accompanied by:: N/A Language Other than English: No Living Arrangements: Other (Comment)(Private residence) What gender do you identify as?: Male Marital status: Single Living Arrangements: Alone Can pt return to current living arrangement?: Yes Admission Status: Voluntary Is patient capable of signing voluntary admission?: Yes Referral Source: Self/Family/Friend Insurance type: [place] Screening Exam ([place]) Medical Exam completed: Yes  Crisis Care Plan Living Arrangements: Alone Legal Guardian: Other:(Self) Name of Psychiatrist: None Name of Therapist: None  Education Status Is patient currently in school?: No Is the patient employed, unemployed or receiving disability?: Unemployed  Risk to self with the past 6 months Suicidal Ideation: No Has patient been a risk to self within the past 6 months prior to admission? : No Suicidal Intent: No Has patient had any suicidal intent within the past 6 months prior to admission? : No Is patient at risk for suicide?: No Suicidal Plan?: No Has patient had any suicidal plan within the  past 6 months prior to admission? : No Access to Means: No What has been your use of drugs/alcohol within the last 12 months?: daily use of alcohol Previous Attempts/Gestures: No How many times?: 0 Other Self Harm Risks: denied by patient Triggers for Past Attempts: None  known Intentional Self Injurious Behavior: None Family Suicide History: No Recent stressful life event(s): Other (Comment), Job Loss(Relationship break up, Grandfather's stroke) Persecutory voices/beliefs?: No Depression: No Depression Symptoms: (denied by patient) Substance abuse history and/or treatment for substance abuse?: Yes Suicide prevention information given to non-admitted patients: Not applicable  Risk to Others within the past 6 months Homicidal Ideation: No Does patient have any lifetime risk of violence toward others beyond the six months prior to admission? : No Thoughts of Harm to Others: No Current Homicidal Intent: No Current Homicidal Plan: No Access to Homicidal Means: No Identified Victim: None identified History of harm to others?: No Assessment of Violence: None Noted Does patient have access to weapons?: Yes (Comment)("couple hand guns, shot gun") Criminal Charges Pending?: No Does patient have a court date: No Is patient on probation?: No  Psychosis Hallucinations: None noted Delusions: None noted  Mental Status Report Appearance/Hygiene: In hospital gown Eye Contact: Fair Motor Activity: Unremarkable Speech: Logical/coherent Level of Consciousness: Alert Mood: Euthymic Affect: Appropriate to circumstance Anxiety Level: Minimal Thought Processes: Coherent Judgement: Unimpaired Orientation: Appropriate for developmental age Obsessive Compulsive Thoughts/Behaviors: None  Cognitive Functioning Concentration: Normal Memory: Recent Intact Is patient IDD: No Insight: Fair Impulse Control: Fair Appetite: Good Have you had any weight changes? : No Change Sleep: Decreased(Depends on if he is drinking, without drinking, can't sleep) Vegetative Symptoms: None  ADLScreening [facility] Assessment Services) Patient's cognitive ability adequate to safely complete daily activities?: Yes Patient able to express need for assistance with ADLs?:  Yes Independently performs ADLs?: Yes (appropriate for developmental age)  Prior Inpatient Therapy Prior Inpatient Therapy: No  Prior Outpatient Therapy Prior Outpatient Therapy: No Does patient have an ACCT team?: No Does patient have Intensive In-House Services?  : No Does patient have [facility] services? : No Does patient have P4CC services?: No  ADL Screening (condition at time of admission) Patient's cognitive ability adequate to safely complete daily activities?: Yes Is the patient deaf or have difficulty hearing?: No Does the patient have difficulty seeing, even when wearing glasses/contacts?: No Does the patient have difficulty concentrating, remembering, or making decisions?: No Patient able to express need for assistance with ADLs?: Yes Does the patient have difficulty dressing or bathing?: No Independently performs ADLs?: Yes (appropriate for developmental age) Does the patient have difficulty walking or climbing stairs?: No Weakness of Legs: None Weakness of Arms/Hands: None  Home Assistive Devices/Equipment Home Assistive Devices/Equipment: None    Abuse/Neglect Assessment (Assessment to be complete while patient is alone) Abuse/Neglect Assessment Can Be Completed: (Patient denied a history of abuse)     Advance Directives (For Healthcare) Does Patient Have a Medical Advance Directive?: No          Disposition:  Disposition Initial Assessment Completed for this Encounter: Yes  On Site Evaluation by:   Reviewed with Physician:    Ryan Faulkner 12/16/2018 2:36 AM

## 2018-12-17 LAB — HIV ANTIBODY (ROUTINE TESTING W REFLEX): HIV Screen 4th Generation wRfx: NONREACTIVE

## 2018-12-17 MED ORDER — CHLORDIAZEPOXIDE HCL 25 MG PO CAPS: ORAL_CAPSULE | ORAL | 0 refills | Status: DC

## 2018-12-17 NOTE — Discharge Summary (Addendum)
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  He was concerned that he was going to have a seizure.  He would rate slightly increase his alcohol intake from 8 beers a day to 24 beers a day.  He was admitted for further management.  He was given IV fluids and IV Ativan PRN for withdrawal symptoms.  His symptoms greatly improved.  Alcohol cessation was discussed.  Patient stated that he would like to stop drinking alcohol.  He was discharged home on a Librium taper.  DISCHARGE CONDITIONS:  Alcohol abuse Tobacco abuse Hypertension Depression CONSULTS OBTAINED:  None DRUG ALLERGIES:  No Known Allergies DISCHARGE MEDICATIONS:   Allergies as of 12/17/2018   No Known Allergies     Medication List    STOP taking these medications   EPINEPHrine 0.3 mg/0.3 mL Soaj injection Commonly known as: EpiPen 2-Pak   predniSONE 10 MG tablet Commonly known as: DELTASONE     TAKE these medications   amLODipine 5 MG tablet Commonly known as: NORVASC Take 1 tablet (5 mg total) by mouth daily.   chlordiazePOXIDE 25 MG capsule Commonly known as: LIBRIUM Take 1 tablet (25mg ) by mouth 4 times a day for 2 days, then take 1 tablet by mouth 3 times a day for 2 days, then take 1  tablet by mouth twice a day for 2 days, then take 1 tablet by mouth once a day for 2 days   diphenhydrAMINE 25 mg capsule Commonly known as: BENADRYL Take 100 mg by mouth every 6 (six) hours as needed for allergies.   escitalopram 20 MG tablet Commonly known as: LEXAPRO Take 1 tablet by mouth daily.   lisinopril 10 MG tablet Commonly known as: ZESTRIL Take 1 tablet by mouth daily.        DISCHARGE INSTRUCTIONS:  1.  Follow-up with PCP in 5 days 2.  Take Librium taper as prescribed DIET:  Cardiac diet DISCHARGE CONDITION:  Stable ACTIVITY:  Activity as tolerated OXYGEN:  Home Oxygen: No.  Oxygen Delivery: room air DISCHARGE LOCATION:  home   If you experience worsening of your admission symptoms, develop shortness of breath, life threatening emergency, suicidal or homicidal thoughts you must seek medical attention immediately by calling 911 or calling your MD immediately  if symptoms less severe.  You Must read complete instructions/literature along with all the possible adverse reactions/side effects for all the Medicines you take and that have been prescribed to you. Take any new Medicines after you have completely understood and accpet all the possible adverse reactions/side effects.   Please note  You were cared for by a hospitalist during your hospital stay. If you have any questions about your discharge medications or the care you received while you were in the hospital  after you are discharged, you can call the unit and asked to speak with the hospitalist on call if the hospitalist that took care of you is not available. Once you are discharged, your primary care physician will handle any further medical issues. Please note that NO REFILLS for any discharge medications will be authorized once you are discharged, as it is imperative that you return to your primary care physician (or establish a relationship with a primary care physician if you do not have one) for your  aftercare needs so that they can reassess your need for medications and monitor your lab values.    On the day of Discharge:  VITAL SIGNS:  Blood pressure (!) 126/93, pulse 74, temperature 98.7 F (37.1 C), temperature source Oral, resp. rate 16, height 5\' 10"  (1.778 m), weight 95.3 kg, SpO2 98 %. PHYSICAL EXAMINATION:  GENERAL:  38 y.o.-year-old patient lying in the bed with no acute distress.  EYES: Pupils equal, round, reactive to light and accommodation. No scleral icterus. Extraocular muscles intact.  HEENT: Head atraumatic, normocephalic. Oropharynx and nasopharynx clear. + Erythema of both cheeks NECK:  Supple, no jugular venous distention. No thyroid enlargement, no tenderness.  LUNGS: Normal breath sounds bilaterally, no wheezing, rales,rhonchi or crepitation. No use of accessory muscles of respiration.  CARDIOVASCULAR: S1, S2 normal. No murmurs, rubs, or gallops.  ABDOMEN: Soft, non-tender, non-distended. Bowel sounds present. No organomegaly or mass.  EXTREMITIES: No pedal edema, cyanosis, or clubbing.  NEUROLOGIC: Cranial nerves II through XII are intact. Muscle strength 5/5 in all extremities. Sensation intact. Gait not checked.  PSYCHIATRIC: The patient is alert and oriented x 3.  SKIN: No obvious rash, lesion, or ulcer.  DATA REVIEW:   CBC Recent Labs  Lab 12/16/18 0611  WBC 7.5  HGB 15.4  HCT 44.3  PLT 115*    Chemistries  Recent Labs  Lab 12/16/18 0611  NA 138  K 3.9  CL 105  CO2 25  GLUCOSE 103*  BUN <5*  CREATININE 0.60*  CALCIUM 8.5*  MG 2.3  AST 163*  ALT 186*  ALKPHOS 81  BILITOT 2.5*     Microbiology Results  Results for orders placed or performed during the hospital encounter of 12/16/18  SARS Coronavirus 2 (CEPHEID - Performed in [facility] hospital lab), Hosp Order     Status: None   Collection Time: 12/16/18  1:51 AM   Specimen: Nasopharyngeal Swab  Result Value Ref Range Status   SARS Coronavirus 2 NEGATIVE NEGATIVE Final     Comment: (NOTE) If result is NEGATIVE SARS-CoV-2 target nucleic acids are NOT DETECTED. The SARS-CoV-2 RNA is generally detectable in upper and lower  respiratory specimens during the acute phase of infection. The lowest  concentration of SARS-CoV-2 viral copies this assay can detect is 250  copies / mL. A negative result does not preclude SARS-CoV-2 infection  and should not be used as the sole basis for treatment or other  patient management decisions.  A negative result may occur with  improper specimen collection / handling, submission of specimen other  than nasopharyngeal swab, presence of viral mutation(s) within the  areas targeted by this assay, and inadequate number of viral copies  (<250 copies / mL). A negative result must be combined with clinical  observations, patient history, and epidemiological information. If result is POSITIVE SARS-CoV-2 target nucleic acids are DETECTED. The SARS-CoV-2 RNA is generally detectable in upper and lower  respiratory specimens dur ing the acute phase of infection.  Positive  results are indicative of active infection with SARS-CoV-2.  Clinical  correlation with patient history and other diagnostic information is  necessary to determine patient infection status.  Positive results do  not rule out bacterial infection or co-infection with other viruses. If result is PRESUMPTIVE POSTIVE SARS-CoV-2 nucleic acids MAY BE PRESENT.   A presumptive positive result was obtained on the submitted specimen  and confirmed on repeat testing.  While 2019 novel coronavirus  (SARS-CoV-2) nucleic acids may be present in the submitted sample  additional confirmatory testing may be necessary for epidemiological  and / or clinical management purposes  to differentiate between  SARS-CoV-2 and other Sarbecovirus currently known to infect humans.  If clinically indicated additional testing with an alternate test  methodology [phone]([id number]) is advised. The SARS-CoV-2  RNA is generally  detectable in upper and lower respiratory sp ecimens during the acute  phase of infection. The expected result is Negative. Fact Sheet for Patients:  BoilerBrush.com.cyhttps://www.fda.gov/media/136312/download Fact Sheet for Healthcare Providers: https://pope.com/https://www.fda.gov/media/136313/download This test is not yet approved or cleared by the [place] FDA and has been authorized for detection and/or diagnosis of SARS-CoV-2 by FDA under an Emergency Use Authorization (EUA).  This EUA will remain in effect (meaning this test can be used) for the duration of the COVID-19 declaration under Section 564(b)(1) of the Act, 21 U.S.C. section 360bbb-3(b)(1), unless the authorization is terminated or revoked sooner. Performed at [facility], [address] [place], [place] [address]     RADIOLOGY:  No results found.   Management plans discussed with the patient, family and they are in agreement.  CODE STATUS: Full Code   TOTAL TIME TAKING CARE OF THIS PATIENT: 40 minutes.    Jinny BlossomKaty D Ziaire Hagos M.D on 12/17/2018 at 1:24 PM  Between 7am to 6pm - Pager - [phone]  After 6pm go to [email or web address] - Social research officer, governmentpassword EPAS [facility]  [facility]  Office  [phone]  CC: Primary care physician; Patient, No Pcp Per   Note: This dictation was prepared with Dragon dictation along with smaller phrase technology. Any transcriptional errors that result from this process are unintentional.

## 2018-12-17 NOTE — Progress Notes (Signed)
MD ordered patient to be discharged home.  Discharge instructions were reviewed with the patient and he voiced understanding.  Follow-up appointment was made.  Prescription given to the patient.  IV was removed with catheter intact by the patient.  All patients questions were answered.  I walked the patient out to his ride.  His ride was not here yet and I tried to get him to come back up to his room but he refused and said he wanted to wait outside by the entrance.

## 2018-12-17 NOTE — Discharge Instructions (Signed)
It was so nice to meet you during this hospitalization!  You came into the hospital with alcohol withdrawal.  I am discharging you with a medicine called Librium to help you with alcohol withdrawal symptoms at home. Please take this medicine as follows: -Take 1 tablet (25mg ) 4 times a day for 2 days -Take 1 tablet (25mg ) 3 times a day for 2 days -Take 1 tablet (25mg ) 2 times a day for 2 days -Take 1 tablet (25mg ) 1 time a day for 2 days  Take care, Dr. Brett Albino

## 2019-12-09 ENCOUNTER — Emergency Department
Admission: EM | Admit: 2019-12-09 | Discharge: 2019-12-09 | Disposition: A | Discharge status: Home / Self Care | Payer: Self-pay | Attending: Emergency Medicine | Admitting: Emergency Medicine

## 2019-12-09 ENCOUNTER — Other Ambulatory Visit: Payer: Self-pay

## 2019-12-09 DIAGNOSIS — Y999 Unspecified external cause status: Secondary | ICD-10-CM | POA: Insufficient documentation

## 2019-12-09 DIAGNOSIS — S70361A Insect bite (nonvenomous), right thigh, initial encounter: Secondary | ICD-10-CM | POA: Insufficient documentation

## 2019-12-09 DIAGNOSIS — L03115 Cellulitis of right lower limb: Secondary | ICD-10-CM | POA: Insufficient documentation

## 2019-12-09 DIAGNOSIS — I10 Essential (primary) hypertension: Secondary | ICD-10-CM | POA: Insufficient documentation

## 2019-12-09 DIAGNOSIS — Z79899 Other long term (current) drug therapy: Secondary | ICD-10-CM | POA: Insufficient documentation

## 2019-12-09 DIAGNOSIS — W57XXXA Bitten or stung by nonvenomous insect and other nonvenomous arthropods, initial encounter: Secondary | ICD-10-CM | POA: Insufficient documentation

## 2019-12-09 DIAGNOSIS — F17200 Nicotine dependence, unspecified, uncomplicated: Secondary | ICD-10-CM | POA: Insufficient documentation

## 2019-12-09 DIAGNOSIS — Y929 Unspecified place or not applicable: Secondary | ICD-10-CM | POA: Insufficient documentation

## 2019-12-09 DIAGNOSIS — Y9389 Activity, other specified: Secondary | ICD-10-CM | POA: Insufficient documentation

## 2019-12-09 MED ORDER — NAPROXEN 500 MG PO TABS: 500.0000 mg | ORAL_TABLET | Freq: Two times a day (BID) | ORAL | Status: DC

## 2019-12-09 MED ORDER — NAPROXEN 500 MG PO TABS
500.0000 mg | ORAL_TABLET | Freq: Once | ORAL | Status: AC
  Administered 2019-12-09: 500 mg via ORAL
  Filled 2019-12-09: qty 1

## 2019-12-09 MED ORDER — SULFAMETHOXAZOLE-TRIMETHOPRIM 800-160 MG PO TABS
1.0000 | ORAL_TABLET | Freq: Once | ORAL | Status: AC
  Administered 2019-12-09: 1 via ORAL
  Filled 2019-12-09: qty 1

## 2019-12-09 MED ORDER — SULFAMETHOXAZOLE-TRIMETHOPRIM 800-160 MG PO TABS: 1.0000 | ORAL_TABLET | Freq: Two times a day (BID) | ORAL | 0 refills | Status: DC

## 2019-12-09 NOTE — Discharge Instructions (Signed)
Follow discharge care instruction take medication as directed. °

## 2019-12-09 NOTE — ED Notes (Signed)
See triage note  Presents with possible insect bite to posterior left upper leg  States he notice area about 2 days ago  Now area is red with black center  No fever

## 2019-12-09 NOTE — ED Provider Notes (Signed)
[facility] Emergency Department Provider Note   ____________________________________________   First MD Initiated Contact with Patient 12/09/19 [phone]     (approximate)  I have reviewed the triage vital signs and the nursing notes.   HISTORY  Chief Complaint Abscess    HPI Ryan Faulkner is a 39 y.o. male patient complain abscess to posterior right thigh for 3 days.  Patient state unsure what type of insect bit him.  Patient denies fever, nausea, vomiting, diarrhea.  Patient denies headache or vision disturbance.  Patient rates his pain as a 5/10.  Patient described pain is "achy".  No palliative measures for complaint.         Past Medical History:  Diagnosis Date  . 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 Hypertension     Patient Active Problem List   Diagnosis Date Noted  . Delirium tremens ([place]) 12/16/2018    Past Surgical History:  Procedure Laterality Date  . TONSILLECTOMY      Prior to Admission medications   Medication Sig Start Date End Date Taking? Authorizing Provider  amLODipine (NORVASC) 5 MG tablet Take 1 tablet (5 mg total) by mouth daily. 08/14/17 12/16/18  Merlyn Lot, MD  chlordiazePOXIDE (LIBRIUM) 25 MG capsule Take 1 tablet (25mg ) by mouth 4 times a day for 2 days, then take 1 tablet by mouth 3 times a day for 2 days, then take 1 tablet by mouth twice a day for 2 days, then take 1 tablet by mouth once a day for 2 days 12/17/18   Mayo, Pete Pelt, MD  escitalopram (LEXAPRO) 20 MG tablet Take 1 tablet by mouth daily. 06/27/18   [provider]  lisinopril (ZESTRIL) 10 MG tablet Take 1 tablet by mouth daily. 04/16/18   [provider]  naproxen (NAPROSYN) 500 MG tablet Take 1 tablet (500 mg total) by mouth 2 (two) times daily with a meal. 12/09/19   Sable Feil, PA-C  sulfamethoxazole-trimethoprim (BACTRIM DS) 800-160 MG tablet Take 1 tablet by mouth 2 (two) times daily. 12/09/19   Sable Feil, PA-C    Allergies Patient has no known allergies.  No family history on file.  Social History Social History   Tobacco Use  . Smoking status: Current Every Day Smoker  . Smokeless tobacco: Never Used  Substance Use Topics  . Alcohol use: Yes    Alcohol/week: 24.0 standard drinks    Types: 24 Cans of beer per week  . Drug use: No    Review of Systems Constitutional: No fever/chills Eyes: No visual changes. ENT: No sore throat. Cardiovascular: Denies chest pain. Respiratory: Denies shortness of breath. Gastrointestinal: No abdominal pain.  No nausea, no vomiting.  No diarrhea.  No constipation. Genitourinary: Negative for dysuria. Musculoskeletal: Negative for back pain. Skin: Negative for rash. Neurological: Negative for headaches, focal weakness or numbness. Psychiatric: Depression  Endocrine:  Hypertension   ____________________________________________   PHYSICAL EXAM:  VITAL SIGNS: ED Triage Vitals  Enc Vitals Group     BP 12/09/19 0414 (!) 174/106     Pulse Rate 12/09/19 0414 95     Resp 12/09/19 0414 15     Temp 12/09/19 0414 98.5 F (36.9 C)     Temp Source 12/09/19 0414 Oral     SpO2 12/09/19 0414 98 %     Weight 12/09/19 0412 195 lb (88.5 kg)     Height 12/09/19 0412 5\' 10"  (1.778 m)  Head Circumference --      Peak Flow --      Pain Score 12/09/19 0412 5     Pain Loc --      Pain Edu? --      Excl. in GC? --     Constitutional: Alert and oriented. Well appearing and in no acute distress. Cardiovascular: Normal rate, regular rhythm. Grossly normal heart sounds.  Good peripheral circulation.  Elevated blood pressure. Respiratory: Normal respiratory effort.  No retractions. Lungs CTAB. Skin: Papular lesion on erythematous base posterior right thigh.  Psychiatric: Mood and affect are normal. Speech and behavior are normal.  ____________________________________________   LABS (all labs ordered are listed, but only abnormal results  are displayed)  Labs Reviewed - No data to display ____________________________________________  EKG   ____________________________________________  RADIOLOGY  ED MD interpretation:    Official radiology report(s): No results found.  ____________________________________________   PROCEDURES  Procedure(s) performed (including Critical Care):  Procedures   ____________________________________________   INITIAL IMPRESSION / ASSESSMENT AND PLAN / ED COURSE  As part of my medical decision making, I reviewed the following data within the electronic MEDICAL RECORD NUMBER      Patient presents with pain edema posterior right thigh secondary to insect bite.  Patient given discharge care instruction, prescription for Bactrim DS and naproxen.  Patient advised [facility] with [facility].    Ryan Faulkner was evaluated in Emergency Department on 12/09/2019 for the symptoms described in the history of present illness. He was evaluated in the context of the global COVID-19 pandemic, which necessitated consideration that the patient might be at risk for infection with the SARS-CoV-2 virus that causes COVID-19. Institutional protocols and algorithms that pertain to the evaluation of patients at risk for COVID-19 are in a state of rapid change based on information released by regulatory bodies including the CDC and federal and state organizations. These policies and algorithms were followed during the patient's care in the ED.       ____________________________________________   FINAL CLINICAL IMPRESSION(S) / ED DIAGNOSES  Final diagnoses:  Bug bite with infection, initial encounter     ED Discharge Orders         Ordered    sulfamethoxazole-trimethoprim (BACTRIM DS) 800-160 MG tablet  2 times daily     Discontinue  Reprint     12/09/19 0750    naproxen (NAPROSYN) 500 MG tablet  2 times daily with meals     Discontinue  Reprint     12/09/19 0750           Note:   This document was prepared using Dragon voice recognition software and may include unintentional dictation errors.    Joni Reining, PA-C 12/09/19 3428    Dionne Bucy, MD 12/09/19 1159

## 2019-12-09 NOTE — ED Triage Notes (Signed)
Pt arrives to ED via POV from home with c/o abscess r/t possible bug bite on the posterior left thigh since Sunday. Pt unsure of actual insect bite; states pain has gradually increased. No fever; no c/o N/V/D. Pt is A&O, in NAD; RR even, regular, and unlabored.

## 2019-12-30 ENCOUNTER — Telehealth: Payer: Self-pay | Admitting: General Practice

## 2019-12-30 NOTE — Telephone Encounter (Signed)
Spoke with individual regarding ED referral and they stated that their work will be providing insurance in the next two weeks.

## 2020-02-15 ENCOUNTER — Other Ambulatory Visit: Payer: Self-pay

## 2020-02-15 ENCOUNTER — Emergency Department
Admission: EM | Admit: 2020-02-15 | Discharge: 2020-02-17 | Disposition: A | Discharge status: Home / Self Care | Payer: Self-pay | Attending: Emergency Medicine | Admitting: Emergency Medicine

## 2020-02-15 DIAGNOSIS — R45851 Suicidal ideations: Secondary | ICD-10-CM | POA: Insufficient documentation

## 2020-02-15 DIAGNOSIS — Y908 Blood alcohol level of 240 mg/100 ml or more: Secondary | ICD-10-CM | POA: Insufficient documentation

## 2020-02-15 DIAGNOSIS — F17200 Nicotine dependence, unspecified, uncomplicated: Secondary | ICD-10-CM | POA: Insufficient documentation

## 2020-02-15 DIAGNOSIS — F1020 Alcohol dependence, uncomplicated: Secondary | ICD-10-CM | POA: Diagnosis present

## 2020-02-15 DIAGNOSIS — Z20822 Contact with and (suspected) exposure to covid-19: Secondary | ICD-10-CM | POA: Insufficient documentation

## 2020-02-15 DIAGNOSIS — R000 Tachycardia, unspecified: Secondary | ICD-10-CM | POA: Insufficient documentation

## 2020-02-15 DIAGNOSIS — Z79899 Other long term (current) drug therapy: Secondary | ICD-10-CM | POA: Insufficient documentation

## 2020-02-15 DIAGNOSIS — I10 Essential (primary) hypertension: Secondary | ICD-10-CM | POA: Insufficient documentation

## 2020-02-15 DIAGNOSIS — F332 Major depressive disorder, recurrent severe without psychotic features: Secondary | ICD-10-CM | POA: Insufficient documentation

## 2020-02-15 DIAGNOSIS — F1010 Alcohol abuse, uncomplicated: Secondary | ICD-10-CM | POA: Insufficient documentation

## 2020-02-15 LAB — CBC
HCT: 52.8 % — ABNORMAL HIGH (ref 39.0–52.0)
Hemoglobin: 18.6 g/dL — ABNORMAL HIGH (ref 13.0–17.0)
MCH: 32.1 pg (ref 26.0–34.0)
MCHC: 35.2 g/dL (ref 30.0–36.0)
MCV: 91.2 fL (ref 80.0–100.0)
Platelets: 245 10*3/uL (ref 150–400)
RBC: 5.79 MIL/uL (ref 4.22–5.81)
RDW: 12.9 % (ref 11.5–15.5)
WBC: 12.3 10*3/uL — ABNORMAL HIGH (ref 4.0–10.5)
nRBC: 0 % (ref 0.0–0.2)

## 2020-02-15 LAB — COMPREHENSIVE METABOLIC PANEL
ALT: 79 U/L — ABNORMAL HIGH (ref 0–44)
AST: 57 U/L — ABNORMAL HIGH (ref 15–41)
Albumin: 4.8 g/dL (ref 3.5–5.0)
Alkaline Phosphatase: 125 U/L (ref 38–126)
Anion gap: 17 — ABNORMAL HIGH (ref 5–15)
BUN: 7 mg/dL (ref 6–20)
CO2: 22 mmol/L (ref 22–32)
Calcium: 9.1 mg/dL (ref 8.9–10.3)
Chloride: 97 mmol/L — ABNORMAL LOW (ref 98–111)
Creatinine, Ser: 0.76 mg/dL (ref 0.61–1.24)
GFR calc Af Amer: >60 mL/min (ref >60)
GFR calc non Af Amer: >60 mL/min (ref >60)
Glucose, Bld: 116 mg/dL — ABNORMAL HIGH (ref 70–99)
Potassium: 3.9 mmol/L (ref 3.5–5.1)
Sodium: 136 mmol/L (ref 135–145)
Total Bilirubin: 1.3 mg/dL — ABNORMAL HIGH (ref 0.3–1.2)
Total Protein: 8.9 g/dL — ABNORMAL HIGH (ref 6.5–8.1)

## 2020-02-15 LAB — URINE DRUG SCREEN, QUALITATIVE (ARMC ONLY)
Amphetamines, Ur Screen: NOT DETECTED
Barbiturates, Ur Screen: NOT DETECTED
Benzodiazepine, Ur Scrn: NOT DETECTED
Cannabinoid 50 Ng, Ur ~~LOC~~: NOT DETECTED
Cocaine Metabolite,Ur ~~LOC~~: NOT DETECTED
MDMA (Ecstasy)Ur Screen: NOT DETECTED
Methadone Scn, Ur: NOT DETECTED
Opiate, Ur Screen: NOT DETECTED
Phencyclidine (PCP) Ur S: NOT DETECTED
Tricyclic, Ur Screen: NOT DETECTED

## 2020-02-15 LAB — ETHANOL: Alcohol, Ethyl (B): 317 mg/dL (ref <10)

## 2020-02-15 LAB — SALICYLATE LEVEL: Salicylate Lvl: <7 mg/dL — ABNORMAL LOW (ref 7.0–30.0)

## 2020-02-15 LAB — SARS CORONAVIRUS 2 BY RT PCR (HOSPITAL ORDER, PERFORMED IN ~~LOC~~ HOSPITAL LAB): SARS Coronavirus 2: NEGATIVE

## 2020-02-15 LAB — ACETAMINOPHEN LEVEL: Acetaminophen (Tylenol), Serum: <10 ug/mL — ABNORMAL LOW (ref 10–30)

## 2020-02-15 MED ORDER — NICOTINE 21 MG/24HR TD PT24
21.0000 mg | MEDICATED_PATCH | Freq: Every day | TRANSDERMAL | Status: DC
  Administered 2020-02-15 – 2020-02-17 (×3): 21 mg via TRANSDERMAL
  Filled 2020-02-15 (×3): qty 1

## 2020-02-15 MED ORDER — THIAMINE HCL 100 MG/ML IJ SOLN: 100.0000 mg | Freq: Every day | INTRAMUSCULAR | Status: DC

## 2020-02-15 MED ORDER — LORAZEPAM 2 MG PO TABS
0.0000 mg | ORAL_TABLET | Freq: Four times a day (QID) | ORAL | Status: AC
  Administered 2020-02-15 – 2020-02-17 (×6): 2 mg via ORAL
  Filled 2020-02-15 (×6): qty 1

## 2020-02-15 MED ORDER — THIAMINE HCL 100 MG PO TABS
100.0000 mg | ORAL_TABLET | Freq: Every day | ORAL | Status: DC
  Administered 2020-02-15 – 2020-02-17 (×3): 100 mg via ORAL
  Filled 2020-02-15 (×3): qty 1

## 2020-02-15 MED ORDER — LORAZEPAM 2 MG/ML IJ SOLN: 0.0000 mg | Freq: Four times a day (QID) | INTRAMUSCULAR | Status: AC

## 2020-02-15 MED ORDER — LORAZEPAM 2 MG/ML IJ SOLN: 0.0000 mg | Freq: Two times a day (BID) | INTRAMUSCULAR | Status: DC

## 2020-02-15 MED ORDER — LORAZEPAM 2 MG PO TABS: 0.0000 mg | ORAL_TABLET | Freq: Two times a day (BID) | ORAL | Status: DC

## 2020-02-15 NOTE — ED Notes (Addendum)
Hourly rounding reveals patient laying in bed. Meal tray provided.  Pt reports feeling warm, perspiration noted.  No other complaints, stable, in no acute distress. Q15 minute rounds and monitoring via Psychologist, counselling to continue.

## 2020-02-15 NOTE — ED Notes (Signed)
Dressed out into burgundy scrubs from grey shirt, blue underpants, black shoes, black belt, grey pants.

## 2020-02-15 NOTE — ED Notes (Signed)
Hourly rounding reveals patient sleeping in room. No complaints, stable, in no acute distress. Q15 minute rounds and monitoring via Rover and Officer to continue.  

## 2020-02-15 NOTE — Consult Note (Signed)
[facility] Face-to-Face Psychiatry Consult   Reason for Consult:  Suicide threat Referring Physician:  EDP Patient Identification: Ryan Faulkner MRN:  [id number] Principal Diagnosis: Major depressive disorder, recurrent severe without psychotic features (HCC) Diagnosis:  Principal Problem:   Major depressive disorder, recurrent severe without psychotic features (HCC) Active Problems:   Alcohol use disorder, severe, dependence (HCC)  Total Time spent with patient: 45 minutes  Subjective:   Ryan Faulkner is a 39 y.o. male patient admitted with suicide threat.  Patient seen and evaluated in person by this provider and TTS.  He presents to the ED with suicidal thoughts and plan to shoot himself with a gun.  He endorses feelings of hopelessness, helplessness, and worthlessness.  His depression increases with alcohol use drinking per patient 12-15 beers a day since the age of 40 with 2 previous DWIs.  His longer sobriety was 2 days when he was here in the past and was having DTs from withdrawal.  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  Depression is constant through the day and greater in the evening.  He works as a Chartered certified accountant during the week.  Reports a history of depression starting at the age of 26 when he was treated with Wellbutrin and Depakote.  Seems to emphasize the bipolar despite being treated per patient's report not since the age of 56, questionable diagnosis of bipolar.  Appears to be morbid depressive symptoms.  Lives alone with no support system.  No homicidal ideations, hallucinations, mania, and paranoia, or other symptoms not noted above.  Recommend inpatient hospitalization.  HPI per MD: Ryan Faulkner is a 39 y.o. male with a history of alcohol abuse who presents with complaints of depression and feelings of SI.  Patient admits to drinking between 8 and 24 beers a day, he says that he is a chronic functioning alcoholic.  He reports that he has had thoughts of  harming himself.  Does not have a distinct plan.  Reports that he would like to go to the [place] where he apparently does get treatment.  Has no physical complaints at this time  Past Psychiatric History: alcohol dependence, depression  Risk to Self: Suicidal Ideation: Yes-Currently Present Suicidal Intent: No Is patient at risk for suicide?: No, but patient needs Medical Clearance Suicidal Plan?: Yes-Currently Present Specify Current Suicidal Plan: Shoot himself  Access to Means: Yes (owns a gun ) Specify Access to Suicidal Means: Per pt current charges pt has access to a gun  What has been your use of drugs/alcohol within the last 12 months?: alcohol & marijuana  How many times?: 0 Other Self Harm Risks: None reported  Triggers for Past Attempts: None known Intentional Self Injurious Behavior: None Risk to Others: Homicidal Ideation: No Thoughts of Harm to Others: No Current Homicidal Intent: No Current Homicidal Plan: No Access to Homicidal Means: No Identified Victim: n/a History of harm to others?: No Assessment of Violence: None Noted Violent Behavior Description: n/a Does patient have access to weapons?: No Criminal Charges Pending?: Yes Describe Pending Criminal Charges:  nonconcealed weapon Does patient have a court date: Yes Court Date: 03/03/20 Prior Inpatient Therapy: Prior Inpatient Therapy: Yes Prior Therapy Dates: Pt report Mid 2000's Prior Therapy Facilty/Provider(s): [place]  Reason for Treatment: MH Prior Outpatient Therapy: Prior Outpatient Therapy: No Does patient have an ACCT team?: No Does patient have Intensive In-House Services?  : No Does patient have [facility] services? : Unknown Does patient have P4CC  services?: Unknown  Past Medical History:  Past Medical History:  Diagnosis Date  . 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 Hypertension     Past Surgical History:  Procedure Laterality Date  . TONSILLECTOMY     Family History: History  reviewed. No pertinent family history. Family Psychiatric  History: none Social History:  Social History   Substance and Sexual Activity  Alcohol Use Yes  . Alcohol/week: 24.0 standard drinks  . Types: 24 Cans of beer per week     Social History   Substance and Sexual Activity  Drug Use No    Social History   Socioeconomic History  . Marital status: Single    Spouse name: Not on file  . Number of children: Not on file  . Years of education: Not on file  . Highest education level: Not on file  Occupational History  . Not on file  Tobacco Use  . Smoking status: Current Every Day Smoker  . Smokeless tobacco: Never Used  Substance and Sexual Activity  . Alcohol use: Yes    Alcohol/week: 24.0 standard drinks    Types: 24 Cans of beer per week  . Drug use: No  . Sexual activity: Not on file  Other Topics Concern  . Not on file  Social History Narrative  . Not on file   Social Determinants of Health   Financial Resource Strain:   . Difficulty of Paying Living Expenses: Not on file  Food Insecurity:   . Worried About Programme researcher, broadcasting/film/video in the Last Year: Not on file  . Ran Out of Food in the Last Year: Not on file  Transportation Needs:   . Lack of Transportation (Medical): Not on file  . Lack of Transportation (Non-Medical): Not on file  Physical Activity:   . Days of Exercise per Week: Not on file  . Minutes of Exercise per Session: Not on file  Stress:   . Feeling of Stress : Not on file  Social Connections:   . Frequency of Communication with Friends and Family: Not on file  . Frequency of Social Gatherings with Friends and Family: Not on file  . Attends Religious Services: Not on file  . Active Member of Clubs or Organizations: Not on file  . Attends Banker Meetings: Not on file  . Marital Status: Not on file   Additional Social History:    Allergies:  No Known Allergies  Labs:  Results for orders placed or performed during the hospital  encounter of 02/15/20 (from the past 48 hour(s))  Comprehensive metabolic panel     Status: Abnormal   Collection Time: 02/15/20 10:31 AM  Result Value Ref Range   Sodium 136 135 - 145 mmol/L   Potassium 3.9 3.5 - 5.1 mmol/L   Chloride 97 (L) 98 - 111 mmol/L   CO2 22 22 - 32 mmol/L   Glucose, Bld 116 (H) 70 - 99 mg/dL    Comment: Glucose reference range applies only to samples taken after fasting for at least 8 hours.   BUN 7 6 - 20 mg/dL   Creatinine, Ser 9.73 0.61 - 1.24 mg/dL   Calcium 9.1 8.9 - 53.2 mg/dL   Total Protein 8.9 (H) 6.5 - 8.1 g/dL   Albumin 4.8 3.5 - 5.0 g/dL   AST 57 (H) 15 - 41 U/L   ALT 79 (H) 0 - 44 U/L   Alkaline Phosphatase 125 38 - 126  U/L   Total Bilirubin 1.3 (H) 0.3 - 1.2 mg/dL   GFR calc non Af Amer >60 >60 mL/min   GFR calc Af Amer >60 >60 mL/min   Anion gap 17 (H) 5 - 15    Comment: Performed at [facility], [address] [place], [place] [address]  Ethanol     Status: Abnormal   Collection Time: 02/15/20 10:31 AM  Result Value Ref Range   Alcohol, Ethyl (B) 317 (HH) <10 mg/dL    Comment: CRITICAL RESULT CALLED TO, READ BACK BY AND VERIFIED WITH STEPHANIE RUDD 02/15/20 1117 [facility] (NOTE) Lowest detectable limit for serum alcohol is 10 mg/dL.  For medical purposes only. Performed at [facility], [address] [place], [place] [address]   Salicylate level     Status: Abnormal   Collection Time: 02/15/20 10:31 AM  Result Value Ref Range   Salicylate Lvl <7.0 (L) 7.0 - 30.0 mg/dL    Comment: Performed at [facility], [address] [place], [place] [address]  Acetaminophen level     Status: Abnormal   Collection Time: 02/15/20 10:31 AM  Result Value Ref Range   Acetaminophen (Tylenol), Serum <10 (L) 10 - 30 ug/mL    Comment: (NOTE) Therapeutic concentrations vary significantly. A range of 10-30 ug/mL  may be an effective concentration for many patients. However, some  are best treated at concentrations  outside of this range. Acetaminophen concentrations >150 ug/mL at 4 hours after ingestion  and >50 ug/mL at 12 hours after ingestion are often associated with  toxic reactions.  Performed at [facility], [address] [place], [place] [address]   cbc     Status: Abnormal   Collection Time: 02/15/20 10:31 AM  Result Value Ref Range   WBC 12.3 (H) 4.0 - 10.5 K/uL   RBC 5.79 4.22 - 5.81 MIL/uL   Hemoglobin 18.6 (H) 13.0 - 17.0 g/dL   HCT 41.352.8 (H) 39 - 52 %   MCV 91.2 80.0 - 100.0 fL   MCH 32.1 26.0 - 34.0 pg   MCHC 35.2 30.0 - 36.0 g/dL   RDW 24.412.9 01.011.5 - 27.215.5 %   Platelets 245 150 - 400 K/uL   nRBC 0.0 0.0 - 0.2 %    Comment: Performed at [facility], [address] [place], [place] [address]  Urine Drug Screen, Qualitative     Status: None   Collection Time: 02/15/20 10:31 AM  Result Value Ref Range   Tricyclic, Ur Screen NONE DETECTED NONE DETECTED   Amphetamines, Ur Screen NONE DETECTED NONE DETECTED   MDMA (Ecstasy)Ur Screen NONE DETECTED NONE DETECTED   Cocaine Metabolite,Ur Oxly NONE DETECTED NONE DETECTED   Opiate, Ur Screen NONE DETECTED NONE DETECTED   Phencyclidine (PCP) Ur S NONE DETECTED NONE DETECTED   Cannabinoid 50 Ng, Ur Achille NONE DETECTED NONE DETECTED   Barbiturates, Ur Screen NONE DETECTED NONE DETECTED   Benzodiazepine, Ur Scrn NONE DETECTED NONE DETECTED   Methadone Scn, Ur NONE DETECTED NONE DETECTED    Comment: (NOTE) Tricyclics + metabolites, urine    Cutoff 1000 ng/mL Amphetamines + metabolites, urine  Cutoff 1000 ng/mL MDMA (Ecstasy), urine              Cutoff 500 ng/mL Cocaine Metabolite, urine          Cutoff 300 ng/mL Opiate + metabolites, urine        Cutoff 300 ng/mL Phencyclidine (PCP), urine  Cutoff 25 ng/mL Cannabinoid, urine                 Cutoff 50 ng/mL Barbiturates + metabolites, urine  Cutoff 200 ng/mL Benzodiazepine, urine              Cutoff 200 ng/mL Methadone, urine                   Cutoff 300  ng/mL  The urine drug screen provides only a preliminary, unconfirmed analytical test result and should not be used for non-medical purposes. Clinical consideration and professional judgment should be applied to any positive drug screen result due to possible interfering substances. A more specific alternate chemical method must be used in order to obtain a confirmed analytical result. Gas chromatography / mass spectrometry (GC/MS) is the preferred confirm atory method. Performed at [facility], [address] [place], [place] [address]     Current Facility-Administered Medications  Medication Dose Route Frequency Provider Last Rate Last Admin  . LORazepam (ATIVAN) injection 0-4 mg  0-4 mg Intravenous Q6H Jene Every, MD       Or  . LORazepam (ATIVAN) tablet 0-4 mg  0-4 mg Oral Q6H Jene Every, MD      . Melene Muller ON 02/17/2020] LORazepam (ATIVAN) injection 0-4 mg  0-4 mg Intravenous Q12H Jene Every, MD       Or  . Melene Muller ON 02/17/2020] LORazepam (ATIVAN) tablet 0-4 mg  0-4 mg Oral Q12H Jene Every, MD      . nicotine (NICODERM CQ - dosed in mg/24 hours) patch 21 mg  21 mg Transdermal Daily Jene Every, MD   21 mg at 02/15/20 1424  . thiamine tablet 100 mg  100 mg Oral Daily Jene Every, MD   100 mg at 02/15/20 1424   Or  . thiamine (B-1) injection 100 mg  100 mg Intravenous Daily Jene Every, MD       Current Outpatient Medications  Medication Sig Dispense Refill  . amLODipine (NORVASC) 5 MG tablet Take 1 tablet (5 mg total) by mouth daily. 30 tablet 0  . chlordiazePOXIDE (LIBRIUM) 25 MG capsule Take 1 tablet (25mg ) by mouth 4 times a day for 2 days, then take 1 tablet by mouth 3 times a day for 2 days, then take 1 tablet by mouth twice a day for 2 days, then take 1 tablet by mouth once a day for 2 days 20 capsule 0  . escitalopram (LEXAPRO) 20 MG tablet Take 1 tablet by mouth daily.    lisinopril (ZESTRIL) 10 MG tablet Take 1 tablet by mouth daily.     . naproxen (NAPROSYN) 500 MG tablet Take 1 tablet (500 mg total) by mouth 2 (two) times daily with a meal. 20 tablet 00  . sulfamethoxazole-trimethoprim (BACTRIM DS) 800-160 MG tablet Take 1 tablet by mouth 2 (two) times daily. 20 tablet 0    Musculoskeletal: Strength & Muscle Tone: decreased Gait & Station: normal Patient leans: N/A  Psychiatric Specialty Exam: Physical Exam Vitals and nursing note reviewed.  Constitutional:      Appearance: Normal appearance.  HENT:     Head: Normocephalic.     Nose: Nose normal.  Musculoskeletal:        General: Normal range of motion.     Cervical back: Normal range of motion.  Neurological:     General: No focal deficit present.     Mental Status: He is alert and oriented to person, place, and time.  Psychiatric:        Attention and Perception: Perception normal. He is inattentive.        Mood and Affect: Mood is anxious and depressed.        Speech: Speech normal.        Behavior: Behavior normal. Behavior is cooperative.        Thought Content: Thought content includes suicidal ideation. Thought content includes suicidal plan.        Cognition and Memory: Cognition and memory normal.        Judgment: Judgment normal.     Review of Systems  Psychiatric/Behavioral: Positive for dysphoric mood and suicidal ideas. The patient is nervous/anxious.   All other systems reviewed and are negative.   Blood pressure (!) 168/98, pulse (!) 108, temperature 98.4 F (36.9 C), resp. rate 14, SpO2 98 %.There is no height or weight on file to calculate BMI.  General Appearance: Disheveled  Eye Contact:  Fair  Speech:  Normal Rate  Volume:  Normal  Mood:  Depressed, irritable  Affect:  Congruent  Thought Process:  Coherent and Descriptions of Associations: Intact  Orientation:  Full (Time, Place, and Person)  Thought Content:  Rumination  Suicidal Thoughts:  Yes.  with intent/plan  Homicidal Thoughts:  No  Memory:  Immediate;   Fair Recent;    Fair Remote;   Fair  Judgement:  Impaired  Insight:  Fair  Psychomotor Activity:  Decreased  Concentration:  Concentration: Fair and Attention Span: Fair  Recall:  [employer] of Knowledge:  Fair  Language:  Good  Akathisia:  No  Handed:  Right  AIMS (if indicated):     Assets:  Housing Leisure Time Physical Health Resilience  ADL's:  Intact  Cognition:  WNL  Sleep:        Treatment Plan Summary: Daily contact with patient to assess and evaluate symptoms and progress in treatment, Medication management and Plan major depressive disorder, recurrent,severe without psychosis:  -[facility]  Alcohol use disorder, severe: -Ativan alcohol detox started  Disposition: Recommend psychiatric Inpatient admission when medically cleared.  Nanine Means, NP 02/15/2020 3:07 PM

## 2020-02-15 NOTE — ED Provider Notes (Signed)
[facility] Emergency Department Provider Note   ____________________________________________    I have reviewed the triage vital signs and the nursing notes.   HISTORY  Chief Complaint Suicidal     HPI Ryan Faulkner is a 39 y.o. male with a history of alcohol abuse who presents with complaints of depression and feelings of SI.  Patient admits to drinking between 8 and 24 beers a day, he says that he is a chronic functioning alcoholic.  He reports that he has had thoughts of harming himself.  Does not have a distinct plan.  Reports that he would like to go to the [place] where he apparently does get treatment.  Has no physical complaints at this time  Past Medical History:  Diagnosis Date  . 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 Hypertension     Patient Active Problem List   Diagnosis Date Noted  . Delirium tremens (HCC) 12/16/2018    Past Surgical History:  Procedure Laterality Date  . TONSILLECTOMY      Prior to Admission medications   Medication Sig Start Date End Date Taking? Authorizing Provider  amLODipine (NORVASC) 5 MG tablet Take 1 tablet (5 mg total) by mouth daily. 08/14/17 12/16/18  Willy Eddy, MD  chlordiazePOXIDE (LIBRIUM) 25 MG capsule Take 1 tablet (25mg ) by mouth 4 times a day for 2 days, then take 1 tablet by mouth 3 times a day for 2 days, then take 1 tablet by mouth twice a day for 2 days, then take 1 tablet by mouth once a day for 2 days 12/17/18   Mayo, 12/19/18, MD  escitalopram (LEXAPRO) 20 MG tablet Take 1 tablet by mouth daily. 06/27/18   [provider]  lisinopril (ZESTRIL) 10 MG tablet Take 1 tablet by mouth daily. 04/16/18   [provider]  naproxen (NAPROSYN) 500 MG tablet Take 1 tablet (500 mg total) by mouth 2 (two) times daily with a meal. 12/09/19   12/11/19, PA-C  sulfamethoxazole-trimethoprim (BACTRIM DS) 800-160 MG tablet Take 1 tablet by mouth 2 (two) times  daily. 12/09/19   12/11/19, PA-C     Allergies Patient has no known allergies.  History reviewed. No pertinent family history.  Social History Social History   Tobacco Use  . Smoking status: Current Every Day Smoker  . Smokeless tobacco: Never Used  Substance Use Topics  . Alcohol use: Yes    Alcohol/week: 24.0 standard drinks    Types: 24 Cans of beer per week  . Drug use: No    Review of Systems  Constitutional: No fever/chills Eyes: No visual changes.  ENT: No sore throat. Cardiovascular: Denies chest pain. Respiratory: Denies shortness of breath. Gastrointestinal: No abdominal pain. Genitourinary: Negative for dysuria. Musculoskeletal: Negative for back pain. Skin: Negative for rash. Neurological: Negative for headaches or weakness   ____________________________________________   PHYSICAL EXAM:  VITAL SIGNS: ED Triage Vitals [02/15/20 1021]  Enc Vitals Group     BP (!) 179/109     Pulse Rate (!) 125     Resp 14     Temp 98.4 F (36.9 C)     Temp src      SpO2 98 %     Weight      Height      Head Circumference      Peak Flow      Pain Score 0     Pain Loc  Pain Edu?      Excl. in GC?     Constitutional: Alert and oriented.   Nose: No congestion/rhinnorhea. Mouth/Throat: Mucous membranes are moist.    Cardiovascular: Normal rate, regular rhythm. Grossly normal heart sounds.  Good peripheral circulation. Respiratory: Normal respiratory effort.  No retractions. Lungs CTAB. Gastrointestinal: Soft and nontender. No distention.   Musculoskeletal: No lower extremity tenderness nor edema.  Warm and well perfused Neurologic:  Normal speech and language. No gross focal neurologic deficits are appreciated.  Skin:  Skin is warm, dry and intact. No rash noted. Psychiatric: Mood and affect are normal. Speech and behavior are normal.  ____________________________________________   LABS (all labs ordered are listed, but only abnormal results  are displayed)  Labs Reviewed  COMPREHENSIVE METABOLIC PANEL - Abnormal; Notable for the following components:      Result Value   Chloride 97 (*)    Glucose, Bld 116 (*)    Total Protein 8.9 (*)    AST 57 (*)    ALT 79 (*)    Total Bilirubin 1.3 (*)    Anion gap 17 (*)    All other components within normal limits  ETHANOL - Abnormal; Notable for the following components:   Alcohol, Ethyl (B) 317 (*)    All other components within normal limits  SALICYLATE LEVEL - Abnormal; Notable for the following components:   Salicylate Lvl <7.0 (*)    All other components within normal limits  ACETAMINOPHEN LEVEL - Abnormal; Notable for the following components:   Acetaminophen (Tylenol), Serum <10 (*)    All other components within normal limits  CBC - Abnormal; Notable for the following components:   WBC 12.3 (*)    Hemoglobin 18.6 (*)    HCT 52.8 (*)    All other components within normal limits  URINE DRUG SCREEN, QUALITATIVE ([facility] ONLY)   ____________________________________________  EKG None  ____________________________________________  RADIOLOGY  None ____________________________________________   PROCEDURES  Procedure(s) performed: No  Procedures   Critical Care performed: No ____________________________________________   INITIAL IMPRESSION / ASSESSMENT AND PLAN / ED COURSE  Pertinent labs & imaging results that were available during my care of the patient were reviewed by me and considered in my medical decision making (see chart for details).  Patient well-appearing and in no acute distress.  Mild tachycardia upon arrival improved significantly upon my seeing him  We will initiate CIWA protocol given history of alcoholism.  Have consulted TTS and psychiatry.  Patient consult for safety, he is here voluntarily.  Lab work is overall reassuring, alcohol level noted to be 300+  Mild elevation in AST and ALT likely related to alcohol use  The patient has  been placed in psychiatric observation due to the need to provide a safe environment for the patient while obtaining psychiatric consultation and evaluation, as well as ongoing medical and medication management to treat the patient's condition.  The patient has not been placed under full IVC at this time.     ____________________________________________   FINAL CLINICAL IMPRESSION(S) / ED DIAGNOSES  Final diagnoses:  Suicidal ideation  Alcohol abuse        Note:  This document was prepared using Dragon voice recognition software and may include unintentional dictation errors.   Jene Every, MD 02/15/20 [phone]

## 2020-02-15 NOTE — ED Notes (Signed)
Report to include Situation, Background, Assessment, and Recommendations received from Rebeka RN. Patient alert, warm and dry, in no acute distress. Patient denies SI, HI, AVH and pain. Patient made aware of Q15 minute rounds and Rover and Officer presence for their safety. Patient instructed to come to me with needs or concerns.  

## 2020-02-15 NOTE — ED Notes (Signed)
Pt brought into ED BHU via sally port and wand with metal detector for safety by [place] Security officer. Patient oriented to unit/care area: Pt informed of unit policies and procedures.  Informed that, for their safety, care areas are designed for safety and monitored by security cameras at all times. Patient verbalizes understanding, and verbal contract for safety obtained.Pt shown to their room.  

## 2020-02-15 NOTE — ED Notes (Signed)
[facility]. They referred him to nearest ED and had an officer to drive him, pt is voluntary at present. When asked what has been stressing him, pt states "why are you wearing a mask", states "a lot of things'. Pt is very vague and does not want to talk about why he is here.

## 2020-02-15 NOTE — ED Notes (Signed)
Pt states his last alcoholic drink (beer) was just before he came in (he arrived at 10 am).  He reports drinking daily.  He states he requires at least a six pack every day to keep from going into withdrawals.

## 2020-02-15 NOTE — ED Notes (Signed)
Hourly rounding reveals patient asleep in room. No complaints, stable, in no acute distress. Q15 minute rounds and monitoring via Rover and Officer to continue.  

## 2020-02-15 NOTE — BH Assessment (Signed)
Assessment Note  Ryan Faulkner is an 39 y.o. male who presents to [facility] ED voluntarily for treatment. Per triage note, Pt presents via Sheriff with c/o SI. Reports wants to kill himself. Pt denies details of having a plan 'because im not in my kitchen" Pt voluntary.  During TTS assessment pt presents calm and oriented x 3, irritable but cooperative, and mood-congruent with affect. The pt does not appear to be responding to internal or external stimuli. Neither is the pt presenting with any delusional thinking. Pt verified the information provided to triage RN. Pt stated "I'm trying to get some help to not hurt myself". Pt reports endorsing SI with the plan to shoot himself with a gun. Pt appeared to be intoxicated (slurred speech, thought blocking, smelled of beer). Pt was unclear about triggers to his current symptoms stating "a lot of things". Pt denies a hx of SI or attempts. Pt reports to live alone and to be employed as a Chartered certified accountant in [employer]. Pt admits to having access to weapons but was unclear about his access. Pt reports an INPT hx in [place] for MH but was unable to remember when stating "mid 2000's in [place]". Pt reports a MH hx of Bipolar and depression at 39 years old but denies any current medication compliance or OPT hx. Pt reports to feel more depressed at night than during the day. Pt reports a SA hx with alcohol (12-15 beers daily) and marijuana (every few days). Pt reports drinking alcohol daily since he was 39 years old and identified a sobriety period of 2 days over a year ago. Pt denied any current withdrawal symptoms and identified his last drink to be before his admission. Pt was unclear about his marijuana use. 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 Pt continues to endorse SI but denies any current HI/AH/VH and reports to be unable to provide collateral information at this time stating, "I couldn't even call myself right now".    Per Nanine Means, NP recommend psychiatric Inpatient admission when medically cleared.  Diagnosis: MDD  Past Medical History:  Past Medical History:  Diagnosis Date  . 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 Hypertension     Past Surgical History:  Procedure Laterality Date  . TONSILLECTOMY      Family History: History reviewed. No pertinent family history.  Social History:  reports that he has been smoking. He has never used smokeless tobacco. He reports current alcohol use of about 24.0 standard drinks of alcohol per week. He reports that he does not use drugs.  Additional Social History:  Alcohol / Drug Use Pain Medications: see mar Prescriptions: see mar Over the Counter: see mar History of alcohol / drug use?: Yes Substance #1 Name of Substance 1: alcohol (12-15 beers daily since 39 years old) Substance #2 Name of Substance 2: Marijuana  CIWA: CIWA-Ar BP: (!) 168/98 Pulse Rate: (!) 108 Nausea and Vomiting: no nausea and no vomiting Tactile Disturbances: none Tremor: no tremor Auditory Disturbances: not present Paroxysmal Sweats: no sweat visible Visual Disturbances: not present Anxiety: no anxiety, at ease Headache, Fullness in Head: none present Agitation: normal activity Orientation and Clouding of Sensorium: oriented and can do serial additions CIWA-Ar Total: 0 COWS:    Allergies: No Known Allergies  Home Medications: (Not in a hospital admission)   OB/GYN Status:  No LMP for male patient.  General Assessment Data Location of Assessment: [facility] ED TTS Assessment: In system Is this a Tele or Face-to-Face Assessment?: Face-to-Face Is this an Initial Assessment or a Re-assessment for this encounter?: Initial Assessment Patient Accompanied by:: N/A Language Other than English: No Living Arrangements: Other (Comment) (Private Home ) What gender do you identify as?: Male Date Telepsych consult ordered in [facility]: 02/15/20 Marital  status: Single Maiden name: n/a Pregnancy Status: No Living Arrangements: Alone Can pt return to current living arrangement?: Yes Admission Status: Voluntary Is patient capable of signing voluntary admission?: Yes Referral Source: Self/Family/Friend Insurance type: None      Crisis Care Plan Living Arrangements: Alone Legal Guardian:  (self) Name of Psychiatrist: None reported  Name of Therapist: None reported   Education Status Is patient currently in school?: No Is the patient employed, unemployed or receiving disability?: Employed  Risk to self with the past 6 months Suicidal Ideation: Yes-Currently Present Has patient been a risk to self within the past 6 months prior to admission? : No Suicidal Intent: No Has patient had any suicidal intent within the past 6 months prior to admission? : No Is patient at risk for suicide?: No, but patient needs Medical Clearance Suicidal Plan?: Yes-Currently Present Has patient had any suicidal plan within the past 6 months prior to admission? : No Specify Current Suicidal Plan: Shoot himself  Access to Means: Yes (owns a gun ) Specify Access to Suicidal Means: Per pt current charges pt has access to a gun  What has been your use of drugs/alcohol within the last 12 months?: alcohol & marijuana  Previous Attempts/Gestures: No How many times?: 0 Other Self Harm Risks: None reported  Triggers for Past Attempts: None known Intentional Self Injurious Behavior: None Family Suicide History: Unknown Recent stressful life event(s): Other (Comment) (Pt reports a lot of stuff ) Persecutory voices/beliefs?: No Depression: Yes Depression Symptoms: Feeling angry/irritable Substance abuse history and/or treatment for substance abuse?: Yes Suicide prevention information given to non-admitted patients: Yes  Risk to Others within the past 6 months Homicidal Ideation: No Does patient have any lifetime risk of violence toward others beyond the six  months prior to admission? : No Thoughts of Harm to Others: No Current Homicidal Intent: No Current Homicidal Plan: No Access to Homicidal Means: No Identified Victim: n/a History of harm to others?: No Assessment of Violence: None Noted Violent Behavior Description: n/a Does patient have access to weapons?: No Criminal Charges Pending?: Yes Describe Pending Criminal Charges:  nonconcealed weapon Does patient have a court date: Yes Court Date: 03/03/20 Is patient on probation?: Unknown  Psychosis Hallucinations: None noted Delusions: None noted  Mental Status Report Appearance/Hygiene: In scrubs Eye Contact: Good Motor Activity: Freedom of movement Speech: Logical/coherent Level of Consciousness: Restless, Irritable Mood: Depressed, Irritable Affect: Depressed, Irritable Anxiety Level: None Thought Processes: Coherent, Relevant Judgement: Partial Orientation: Appropriate for developmental age Obsessive Compulsive Thoughts/Behaviors: None  Cognitive Functioning Concentration: Fair Memory: Recent Intact, Remote Intact Is patient IDD: No Insight: Poor Impulse Control: Good Appetite: Good Have you had any weight changes? : No Change Sleep: No Change Total Hours of Sleep: 8 Vegetative Symptoms: None     Prior Inpatient Therapy Prior Inpatient Therapy: Yes Prior Therapy Dates: Pt report Mid 2000's Prior Therapy Facilty/Provider(s): [place]  Reason for Treatment: MH  Prior Outpatient Therapy Prior Outpatient Therapy: No Does patient have an ACCT team?: No Does patient have Intensive In-House Services?  : No Does patient have [facility] services? : Unknown Does patient have  P4CC services?: Unknown  ADL Screening (condition at time of admission) Is the patient deaf or have difficulty hearing?: No Does the patient have difficulty seeing, even when wearing glasses/contacts?: No Does the patient have difficulty concentrating, remembering, or making decisions?: No Does the  patient have difficulty dressing or bathing?: No Does the patient have difficulty walking or climbing stairs?: No Weakness of Legs: None Weakness of Arms/Hands: None  Home Assistive Devices/Equipment Home Assistive Devices/Equipment: None  Therapy Consults (therapy consults require a physician order) PT Evaluation Needed: No OT Evalulation Needed: No SLP Evaluation Needed: No Abuse/Neglect Assessment (Assessment to be complete while patient is alone) Abuse/Neglect Assessment Can Be Completed: Yes Physical Abuse: Denies Verbal Abuse: Denies Sexual Abuse: Denies Exploitation of patient/patient's resources: Denies Self-Neglect: Denies Values / Beliefs Cultural Requests During Hospitalization: None Spiritual Requests During Hospitalization: None Consults Spiritual Care Consult Needed: No Transition of Care Team Consult Needed: No            Disposition:  Disposition Initial Assessment Completed for this Encounter: Yes Patient referred to: Other (Comment)  On Site Evaluation by:   Reviewed with Physician:    Opal Sidles 02/15/2020 2:56 PM

## 2020-02-15 NOTE — BH Assessment (Addendum)
Referral information for Psychiatric Hospitalization faxed to the following facility at the request of the patient  . [facility] [place] [phone] [phone]) Unable to accept patient, currently on diversion

## 2020-02-15 NOTE — ED Triage Notes (Signed)
Pt presents via Sheriff with c/o SI. Reports wants to kill himself. Pt denies details of having a plan 'because im not in my kitchen" Pt voluntary.

## 2020-02-15 NOTE — ED Notes (Signed)
Hourly rounding reveals patient pacing in room, sweat is visible on forehead.  Ice water provided per pt request, he states he is very thirsty.  3-4 drinks have been provided in the past two hours.  No other requests, stable, displaying some anxiety but in no acute distress. Q15 minute rounds and monitoring via Psychologist, counselling to continue.

## 2020-02-15 NOTE — ED Notes (Signed)
Hourly rounding reveals patient sitting in bed.  No complaints, stable, in no acute distress. Q15 minute rounds and monitoring via Psychologist, counselling to continue.

## 2020-02-16 MED ORDER — AMLODIPINE BESYLATE 5 MG PO TABS
5.0000 mg | ORAL_TABLET | Freq: Every day | ORAL | Status: DC
  Administered 2020-02-16 – 2020-02-17 (×2): 5 mg via ORAL
  Filled 2020-02-16 (×2): qty 1

## 2020-02-16 MED ORDER — ESCITALOPRAM OXALATE 10 MG PO TABS
20.0000 mg | ORAL_TABLET | Freq: Every day | ORAL | Status: DC
  Administered 2020-02-16 – 2020-02-17 (×2): 20 mg via ORAL
  Filled 2020-02-16 (×2): qty 2

## 2020-02-16 MED ORDER — NAPROXEN 500 MG PO TABS
500.0000 mg | ORAL_TABLET | Freq: Two times a day (BID) | ORAL | Status: DC
  Administered 2020-02-17: 500 mg via ORAL
  Filled 2020-02-16 (×4): qty 1

## 2020-02-16 MED ORDER — IBUPROFEN 600 MG PO TABS
600.0000 mg | ORAL_TABLET | Freq: Once | ORAL | Status: AC
  Administered 2020-02-16: 600 mg via ORAL
  Filled 2020-02-16: qty 1

## 2020-02-16 MED ORDER — ZOLPIDEM TARTRATE 5 MG PO TABS
5.0000 mg | ORAL_TABLET | Freq: Once | ORAL | Status: AC
  Administered 2020-02-16: 5 mg via ORAL
  Filled 2020-02-16: qty 1

## 2020-02-16 MED ORDER — LISINOPRIL 5 MG PO TABS
10.0000 mg | ORAL_TABLET | Freq: Every day | ORAL | Status: DC
  Administered 2020-02-16 – 2020-02-17 (×2): 10 mg via ORAL
  Filled 2020-02-16 (×2): qty 2

## 2020-02-16 NOTE — ED Notes (Signed)
Pt up to the bathroom then to nursing station to request ibuprofen. Dr Manson Passey informed and will place order.

## 2020-02-16 NOTE — ED Provider Notes (Signed)
Emergency Medicine Observation Re-evaluation Note  Ryan Faulkner is a 39 y.o. male, seen on rounds today.  Pt initially presented to the ED for complaints of Suicidal Currently, the patient is sleeping.  Physical Exam  BP (!) 138/100 (BP Location: Left Arm)   Pulse (!) 106   Temp 98.5 F (36.9 C) (Oral)   Resp 16   SpO2 95%  Physical Exam General: resting Cardiac: well perfused Lungs: spontaneous resp Psych: calm  ED Course / MDM  EKG:    I have reviewed the labs performed to date as well as medications administered while in observation.  Recent changes in the last 24 hours include none.  Plan  Current plan is for psych/sw/cm dispo. Patient is under full IVC at this time.   Willy Eddy, MD 02/16/20 [phone]

## 2020-02-16 NOTE — ED Notes (Signed)
Patient took shower, ask for phone, he is calm and cooperative, will continue to monitor.

## 2020-02-16 NOTE — ED Notes (Addendum)
Pt refused snack 

## 2020-02-17 ENCOUNTER — Inpatient Hospital Stay: Admission: AD | Admit: 2020-02-17 | Payer: Self-pay | Source: Intra-hospital | Admitting: Psychiatry

## 2020-02-17 NOTE — ED Notes (Signed)
Pt reports he received a call that his landlord is evicting him. Pt is asking to be discharged. Pt reports he wants the help but he cannot stay here and end up losing all his stuff. Requesting to speak with psych MD, advised writer will let MD know. He verbalized understanding.

## 2020-02-17 NOTE — ED Notes (Signed)
Vol/pending admit to BMU after 8:30 AM

## 2020-02-17 NOTE — ED Notes (Addendum)
Awaiting orders for BMU

## 2020-02-17 NOTE — Final Progress Note (Signed)
Physician Final Progress Note  Patient ID: POWELL HALBERT MRN: [id number] DOB/AGE: 1980/10/04 39 y.o.  Admit date: 02/15/2020 Admitting provider: No admitting provider for patient encounter. Discharge date: 02/17/2020   Admission Diagnoses:  ETOH intoxication and withdrawal  Major depression moderate recurrent   Voluntary status   Discharge Diagnoses:  Principal Problem:   Major depressive disorder, recurrent severe without psychotic features (HCC) Active Problems:   Alcohol use disorder, severe, dependence (HCC)  Post ETOH intoxication, withdrawal and Mjaor depression moderate recurrent   Consults: TTS / MD Psych /MD ER   Significant Findings/ Diagnostic Studies:  BAL ----elevate d  Procedures: MD rounds ciwa protocol   Discharge Condition: fair  Disposition:  home    Patient initially admitted --for detox and he voiced SI and depression  However he is sober and stable now post CIWA protocol   On the day of finding a bed, he know says he needs to go home because he is e      Was supposed to be voluntarily admitted but now changed his mind due to eviction notice today    He says he safe for discharge and contracts for safety no other SI HI or plans he says    Alert, oriented times four Appearance ---unkept  Messy  Rapport and eye contact okay  Speech normal rate tone volume  Mood and affect still depressed and anxious No shakes tics tremors Thought process and content normal Judgement insight reliability fair Intelligence and fund of knowledge normal No active SI HI or plans Abstraction oK Concentration and attention fair  Consciousness not fluctuant or clouded Memory remote recent immediate okay    Assets  Not clear Cognition improved ADL;s okay  Handedness not known Leans not known  Musculoskeletal  Okay  Gait and station okay  Language normla Sleep improving  Psychomotor activity normal  Akathisia none                      Diet:  As tolerated  Discharge Activity:   AA NA related groups  Day treatment men's groups service work  Vocational rehab        Total time spent taking care of this patient:  30-40  minutes  Signed: Roselind Messier 02/17/2020, 12:21 PM

## 2020-02-17 NOTE — ED Notes (Signed)
Discharge teaching done and pt verbalized understanding. Pt given all his personal belongings. Escorted to lobby, ambulatory and in NAD.

## 2020-02-17 NOTE — ED Provider Notes (Signed)
-----------------------------------------   1:26 PM on 02/17/2020 -----------------------------------------  Patient has been seen and evaluated by psychiatry.  They believe the patient safe for discharge home from a psychiatric standpoint.  Patient's medical work-up is been largely nonrevealing besides an elevated ethanol level upon arrival which is now over 50 hours ago.  Patient will be discharged from the emergency department with outpatient resources.   Minna Antis, MD 02/17/20 1327

## 2020-02-17 NOTE — Discharge Instructions (Addendum)
You have been seen in the emergency department for a  psychiatric concern. You have been evaluated both medically as well as psychiatrically. Please follow-up with your outpatient resources provided. Return to the emergency department for any worsening symptoms, or any thoughts of hurting yourself or anyone else so that we may attempt to help you. 

## 2020-02-17 NOTE — BH Assessment (Signed)
TTS completed reassessment. Pt presents alert, calm and oriented x 4. Pt expressed desires to be discharged after receiving eviction notice by phone. Pt appears sober and stable. Pt denies any current SI/HI/AH/VH and contracted for safety.   Per Dr. Smith Robert pt will be discharged with the recommendation to follow up with resources provided

## 2020-02-17 NOTE — BH Assessment (Signed)
PATIENT BED AVAILABLE AFTER 8:30AM on 02/17/20  Patient is to be admitted to [facility] Attending Physician will be Dr. Toni Amend.      Intake Paper Work has been signed and placed on patient chart.  ER staff is aware of the admission:  Melody ER Secretary    Dr. Larinda Buttery, ER MD   Dewayne Hatch Patient's Nurse   Rosey Bath Patient Access.

## 2020-03-09 ENCOUNTER — Encounter: Payer: Self-pay | Admitting: *Deleted

## 2020-03-09 ENCOUNTER — Emergency Department
Admission: EM | Admit: 2020-03-09 | Discharge: 2020-03-10 | Disposition: A | Discharge status: Other Acute Inpatient Hospital | Payer: BC Managed Care – PPO | Attending: Emergency Medicine | Admitting: Emergency Medicine

## 2020-03-09 ENCOUNTER — Other Ambulatory Visit: Payer: Self-pay

## 2020-03-09 DIAGNOSIS — Z79899 Other long term (current) drug therapy: Secondary | ICD-10-CM | POA: Insufficient documentation

## 2020-03-09 DIAGNOSIS — F329 Major depressive disorder, single episode, unspecified: Secondary | ICD-10-CM | POA: Insufficient documentation

## 2020-03-09 DIAGNOSIS — I10 Essential (primary) hypertension: Secondary | ICD-10-CM | POA: Insufficient documentation

## 2020-03-09 DIAGNOSIS — F17200 Nicotine dependence, unspecified, uncomplicated: Secondary | ICD-10-CM | POA: Insufficient documentation

## 2020-03-09 DIAGNOSIS — Z20822 Contact with and (suspected) exposure to covid-19: Secondary | ICD-10-CM | POA: Insufficient documentation

## 2020-03-09 DIAGNOSIS — F10129 Alcohol abuse with intoxication, unspecified: Secondary | ICD-10-CM | POA: Diagnosis present

## 2020-03-09 DIAGNOSIS — R45851 Suicidal ideations: Secondary | ICD-10-CM | POA: Insufficient documentation

## 2020-03-09 DIAGNOSIS — F332 Major depressive disorder, recurrent severe without psychotic features: Secondary | ICD-10-CM

## 2020-03-09 LAB — CBC
HCT: 46.1 % (ref 39.0–52.0)
Hemoglobin: 17.3 g/dL — ABNORMAL HIGH (ref 13.0–17.0)
MCH: 33.6 pg (ref 26.0–34.0)
MCHC: 37.5 g/dL — ABNORMAL HIGH (ref 30.0–36.0)
MCV: 89.5 fL (ref 80.0–100.0)
Platelets: 191 10*3/uL (ref 150–400)
RBC: 5.15 MIL/uL (ref 4.22–5.81)
RDW: 12 % (ref 11.5–15.5)
WBC: 9.9 10*3/uL (ref 4.0–10.5)
nRBC: 0 % (ref 0.0–0.2)

## 2020-03-09 LAB — COMPREHENSIVE METABOLIC PANEL
ALT: 64 U/L — ABNORMAL HIGH (ref 0–44)
AST: 53 U/L — ABNORMAL HIGH (ref 15–41)
Albumin: 4.7 g/dL (ref 3.5–5.0)
Alkaline Phosphatase: 101 U/L (ref 38–126)
Anion gap: 15 (ref 5–15)
BUN: <5 mg/dL — ABNORMAL LOW (ref 6–20)
CO2: 21 mmol/L — ABNORMAL LOW (ref 22–32)
Calcium: 8.8 mg/dL — ABNORMAL LOW (ref 8.9–10.3)
Chloride: 102 mmol/L (ref 98–111)
Creatinine, Ser: 0.72 mg/dL (ref 0.61–1.24)
GFR calc Af Amer: >60 mL/min (ref >60)
GFR calc non Af Amer: >60 mL/min (ref >60)
Glucose, Bld: 110 mg/dL — ABNORMAL HIGH (ref 70–99)
Potassium: 3.8 mmol/L (ref 3.5–5.1)
Sodium: 138 mmol/L (ref 135–145)
Total Bilirubin: 1.5 mg/dL — ABNORMAL HIGH (ref 0.3–1.2)
Total Protein: 8.5 g/dL — ABNORMAL HIGH (ref 6.5–8.1)

## 2020-03-09 LAB — URINE DRUG SCREEN, QUALITATIVE (ARMC ONLY)
Amphetamines, Ur Screen: NOT DETECTED
Barbiturates, Ur Screen: NOT DETECTED
Benzodiazepine, Ur Scrn: POSITIVE — AB
Cannabinoid 50 Ng, Ur ~~LOC~~: NOT DETECTED
Cocaine Metabolite,Ur ~~LOC~~: NOT DETECTED
MDMA (Ecstasy)Ur Screen: NOT DETECTED
Methadone Scn, Ur: NOT DETECTED
Opiate, Ur Screen: NOT DETECTED
Phencyclidine (PCP) Ur S: NOT DETECTED
Tricyclic, Ur Screen: NOT DETECTED

## 2020-03-09 LAB — ETHANOL: Alcohol, Ethyl (B): 336 mg/dL (ref <10)

## 2020-03-09 LAB — SALICYLATE LEVEL: Salicylate Lvl: <7 mg/dL — ABNORMAL LOW (ref 7.0–30.0)

## 2020-03-09 LAB — SARS CORONAVIRUS 2 BY RT PCR (HOSPITAL ORDER, PERFORMED IN ~~LOC~~ HOSPITAL LAB): SARS Coronavirus 2: NEGATIVE

## 2020-03-09 LAB — ACETAMINOPHEN LEVEL: Acetaminophen (Tylenol), Serum: <10 ug/mL — ABNORMAL LOW (ref 10–30)

## 2020-03-09 MED ORDER — THIAMINE HCL 100 MG PO TABS
100.0000 mg | ORAL_TABLET | Freq: Every day | ORAL | Status: DC
  Administered 2020-03-09: 100 mg via ORAL
  Filled 2020-03-09: qty 1

## 2020-03-09 MED ORDER — NICOTINE 21 MG/24HR TD PT24
21.0000 mg | MEDICATED_PATCH | Freq: Every day | TRANSDERMAL | Status: DC
  Administered 2020-03-09: 21 mg via TRANSDERMAL
  Filled 2020-03-09: qty 1

## 2020-03-09 MED ORDER — LORAZEPAM 2 MG PO TABS: 0.0000 mg | ORAL_TABLET | Freq: Two times a day (BID) | ORAL | Status: DC

## 2020-03-09 MED ORDER — IBUPROFEN 600 MG PO TABS: 600.0000 mg | ORAL_TABLET | Freq: Three times a day (TID) | ORAL | Status: DC | PRN

## 2020-03-09 MED ORDER — BENZTROPINE MESYLATE 0.5 MG PO TABS
0.2500 mg | ORAL_TABLET | Freq: Two times a day (BID) | ORAL | Status: DC
  Administered 2020-03-09: 0.25 mg via ORAL
  Filled 2020-03-09 (×3): qty 1

## 2020-03-09 MED ORDER — ALUM & MAG HYDROXIDE-SIMETH 200-200-20 MG/5ML PO SUSP: 30.0000 mL | Freq: Four times a day (QID) | ORAL | Status: DC | PRN

## 2020-03-09 MED ORDER — CLONAZEPAM 0.5 MG PO TABS
1.0000 mg | ORAL_TABLET | Freq: Two times a day (BID) | ORAL | Status: DC
  Administered 2020-03-09: 1 mg via ORAL
  Filled 2020-03-09: qty 1

## 2020-03-09 MED ORDER — LORAZEPAM 2 MG/ML IJ SOLN: 0.0000 mg | Freq: Two times a day (BID) | INTRAMUSCULAR | Status: DC

## 2020-03-09 MED ORDER — DULOXETINE HCL 30 MG PO CPEP
30.0000 mg | ORAL_CAPSULE | Freq: Every day | ORAL | Status: DC
  Administered 2020-03-09: 30 mg via ORAL
  Filled 2020-03-09 (×2): qty 1

## 2020-03-09 MED ORDER — LORAZEPAM 2 MG/ML IJ SOLN: 0.0000 mg | Freq: Four times a day (QID) | INTRAMUSCULAR | Status: DC

## 2020-03-09 MED ORDER — HALOPERIDOL 0.5 MG PO TABS
0.2500 mg | ORAL_TABLET | Freq: Three times a day (TID) | ORAL | Status: DC
  Administered 2020-03-09 (×3): 0.25 mg via ORAL
  Filled 2020-03-09 (×2): qty 0.5
  Filled 2020-03-09: qty 1
  Filled 2020-03-09 (×2): qty 0.5
  Filled 2020-03-09: qty 1

## 2020-03-09 MED ORDER — THIAMINE HCL 100 MG/ML IJ SOLN: 100.0000 mg | Freq: Every day | INTRAMUSCULAR | Status: DC

## 2020-03-09 MED ORDER — LORAZEPAM 2 MG PO TABS
0.0000 mg | ORAL_TABLET | Freq: Four times a day (QID) | ORAL | Status: DC
  Administered 2020-03-09 (×3): 2 mg via ORAL
  Filled 2020-03-09 (×3): qty 1

## 2020-03-09 MED ORDER — ONDANSETRON HCL 4 MG PO TABS: 4.0000 mg | ORAL_TABLET | Freq: Three times a day (TID) | ORAL | Status: DC | PRN

## 2020-03-09 NOTE — ED Notes (Signed)
Teal tee shirt Wallace Cullens jeans Wallace Cullens sneakers White socks Black hat Cell phone  cigarettes Black wallet 55.00 cash 44 cents Black belt aqua boxers

## 2020-03-09 NOTE — ED Notes (Addendum)
Hourly rounding reveals patient awake in hallway chair. No complaints, stable, in no acute distress. Q15 minute rounds and monitoring via Psychologist, counselling to continue.

## 2020-03-09 NOTE — ED Notes (Signed)
Snack and beverage given. #4&5 cups of water for pt

## 2020-03-09 NOTE — ED Notes (Signed)
Hourly rounding reveals patient awake and sitting in hallway bed. No complaints, stable, in no acute distress. Q15 minute rounds and monitoring via Psychologist, counselling to continue.

## 2020-03-09 NOTE — Final Progress Note (Signed)
Physician Final Progress Note  Patient ID: Ryan Faulkner MRN: [id number] DOB/AGE: 1981-02-26 39 y.o.  Admit date: 03/09/2020 Admitting provider: No admitting provider for patient encounter. Discharge date : pending bed transfer to [facility] or other inpatient psych facility   Seen last month in similar situation BAL in 336 range   Patient seen by TTS last night and is coming down from ETOH withdrawal   CIWA with other preventive meds started today   Pending detox and then inpatient stay   Mental Status   Somewhat Alert cooperative oriented person place and time not clouded or fluctuant  Mood depressed and Affect flat   Ruddy Complexion ---looks disheveled and haggard forlorn ---  Speech somewhat angry loud   Thought process and content depressive and victim issues  Judgement insight reliability poor Fund of knowledge and intelligence declining  SI and HI fair unclear safety margin alludes to harming self unclear plan   Has some shakes tremors and tics   Abstraction poor  Rapport and eye contact poor   Concentration and attention fair to poor Cognition not clouded or fluctuant   Has some slight shakes and tremors   Memory --somewhat vague in general  Leans not known  Handedness not known Akathisia none Recall fair to poor Psychomotor slightly elevated Language English Cognition poor with ETOH  Assets  Seeks help  ADL's limited with ETOH     A/P  Patient awaits bed transfer to [place] or outlying admission.  Recent visit with risk factors needing bed   Age, Military no supports, ETOH relapse, severe depression, unsafe safety margin and psych social depression   CIWA and regular meds started while waiting                  Admission Diagnoses:  ETOH dependence, intoxication withdrawal   Major depression severe recurrent  Dysthymia, Generalized anxiety   Personality disorder NOS   History of Possible PTSD   Discharge Diagnoses:  Active  Problems:   * No active hospital problems. *  Same  Needs bed for dual sianmgo  Consults:   TTS  Psych MD ER MD   Significant Findings/ Diagnostic Studies: none recently   Procedures:   CIWA and rounds   Transfer  Condition:   Pending  Disposition:  awaits in patient treatment   Diet:    As tolerated  Discharge Activity:  per unit he goes to   Total time spent taking care of this patient: 30-40 minutes---    Signed: Roselind Messier 03/09/2020, 11:24 AM

## 2020-03-09 NOTE — ED Notes (Addendum)
Pt wallet, cards, and cash collected and secured in envelop. Secuity collected and placing in lock box. Awaiting key. Pt items counted and collected by this nurse and verified by vanessa, RN. Pt requests other items in wallet be listed at this time and include: 3 [employer]BS cards, 1 [employer]ed[employer] card, 1 [employer] card, 1 [employer] card, BBS visa Card, 1 goodrx card, 1 driver license, 1 [employer] w photo id, and 1 [employer] ID.

## 2020-03-09 NOTE — ED Notes (Signed)
Report to include Situation, Background, Assessment, and Recommendations received from Amy RN. Patient alert and oriented, warm and dry, in no acute distress. Patient denies SI, HI, AVH and pain. Patient made aware of Q15 minute rounds and security cameras for their safety. Patient instructed to come to me with needs or concerns.  

## 2020-03-09 NOTE — ED Notes (Signed)
Pt is angry and uncooperative at this time due to not being able to make phone call at this time. Pt is cursing and angry in hallway in chair. Pt is not answering questions and refusing to talk to staff at this time due to not being allowed to make phone call. Pt educated on phone call times and visitor hours. States, "you can make a phone call in jail."

## 2020-03-09 NOTE — BH Assessment (Signed)
Assessment Note  Ryan Faulkner is an 39 y.o. male presenting to [facility] ED under IVC. Per triage note Pt brought in by [place] sheriff dept handcuffed.  Pt is IVC.  Pt states SI.  Pt reports etoh use tonight.  Pt denies drug use.  Pt cooperative. During assessment patient appears alert and oriented x4, angry, irritable, verbally aggressive and fixated on using the phone to "check on my fucking dog." When asked why patient was presenting to the ED patient reported "I brewed up this strange concoction with alcohol and had suicidal thoughts, the officers refused to buy me alcohol." Patient reported suicidal thoughts being "I self mutilate" and showed clinician his fingers "I chew my fingers down until l don't have a nail anymore." "When I get depressed I just say fuck it!" "or when they don't let me use the phone because they think I have some kind of privilege!" Patient continued to be fixated on using the phone and reported "that officer got to use his phone." Patient reported having poor sleep and appetite. When asked if patient is experiencing HI he reported "I don't know maybe." Patient denies AH/VH. Patient BAL upon arrival is 336.  Patient disposition pending, Patient to be seen by Psyc Provider  Diagnosis: Alcohol Intoxication, Depression by history  Past Medical History:  Past Medical History:  Diagnosis Date  . 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 Hypertension     Past Surgical History:  Procedure Laterality Date  . TONSILLECTOMY      Family History: No family history on file.  Social History:  reports that he has been smoking. He has never used smokeless tobacco. He reports current alcohol use of about 24.0 standard drinks of alcohol per week. He reports that he does not use drugs.  Additional Social History:  Alcohol / Drug Use Pain Medications: See MAR Prescriptions: See MAR Over the Counter: See MAR History of alcohol / drug use?: Yes Substance #1 Name  of Substance 1: Alcohol  CIWA: CIWA-Ar BP: (!) 143/105 Pulse Rate: 99 COWS:    Allergies:  Allergies  Allergen Reactions  . Bee Venom Anaphylaxis    Home Medications: (Not in a hospital admission)   OB/GYN Status:  No LMP for male patient.  General Assessment Data Location of Assessment: [facility] ED TTS Assessment: In system Is this a Tele or Face-to-Face Assessment?: Face-to-Face Is this an Initial Assessment or a Re-assessment for this encounter?: Initial Assessment Patient Accompanied by:: N/A Language Other than English: No Living Arrangements: Other (Comment) What gender do you identify as?: Male Marital status: Single Pregnancy Status: No Living Arrangements: Alone Can pt return to current living arrangement?: Yes Admission Status: Involuntary Petitioner: Police Is patient capable of signing voluntary admission?: No Referral Source: Other Insurance type: None  Medical Screening Exam [facility] Walk-in ONLY) Medical Exam completed: Yes  Crisis Care Plan Living Arrangements: Alone Legal Guardian: Other: (Self) Name of Psychiatrist: None reported  Name of Therapist: None reported   Education Status Is patient currently in school?: No Is the patient employed, unemployed or receiving disability?: Employed  Risk to self with the past 6 months Suicidal Ideation: Yes-Currently Present Has patient been a risk to self within the past 6 months prior to admission? : No Suicidal Intent: No Has patient had any suicidal intent within the past 6 months prior to admission? : No Is patient at risk for suicide?: No Suicidal Plan?: No Has patient had any suicidal plan  within the past 6 months prior to admission? : No Specify Current Suicidal Plan: None Access to Means:  (Patient does own a gun) What has been your use of drugs/alcohol within the last 12 months?: Alcohol Abuse Previous Attempts/Gestures: No How many times?: 0 Other Self Harm Risks: None Triggers for Past  Attempts: None known Intentional Self Injurious Behavior:  (Bitting his finger nails) Family Suicide History: Unknown Recent stressful life event(s): Other (Comment) (None reported) Persecutory voices/beliefs?: No Depression: Yes Depression Symptoms: Feeling angry/irritable Substance abuse history and/or treatment for substance abuse?: Yes Suicide prevention information given to non-admitted patients: Not applicable  Risk to Others within the past 6 months Homicidal Ideation: No Does patient have any lifetime risk of violence toward others beyond the six months prior to admission? : No Thoughts of Harm to Others: No Current Homicidal Intent: No Current Homicidal Plan: No Access to Homicidal Means: No Identified Victim: None History of harm to others?: No Assessment of Violence: None Noted Violent Behavior Description: None Does patient have access to weapons?: No Criminal Charges Pending?: Yes Describe Pending Criminal Charges: Nonconcealed weapon Does patient have a court date: Yes Court Date: 03/03/20 Is patient on probation?: Unknown  Psychosis Hallucinations: None noted Delusions: None noted  Mental Status Report Appearance/Hygiene: In scrubs Eye Contact: Fair Motor Activity: Freedom of movement Speech: Loud Level of Consciousness: Irritable, Restless Mood: Irritable, Angry Affect: Angry, Irritable, Threatening Anxiety Level: None Thought Processes: Coherent Judgement: Partial Orientation: Person, Place, Time, Situation, Appropriate for developmental age Obsessive Compulsive Thoughts/Behaviors: None  Cognitive Functioning Concentration: Fair Memory: Recent Intact, Remote Intact Is patient IDD: No Insight: Poor Impulse Control: Poor Appetite: Poor Have you had any weight changes? : No Change Sleep: Decreased Total Hours of Sleep: 0 Vegetative Symptoms: None  ADLScreening [facility] Assessment Services) Patient's cognitive ability adequate to safely complete  daily activities?: Yes Patient able to express need for assistance with ADLs?: Yes Independently performs ADLs?: Yes (appropriate for developmental age)  Prior Inpatient Therapy Prior Inpatient Therapy: Yes Prior Therapy Dates: Pt report Mid 2000's Prior Therapy Facilty/Provider(s): [place]  Reason for Treatment: MH  Prior Outpatient Therapy Prior Outpatient Therapy: No Does patient have an ACCT team?: No Does patient have Intensive In-House Services?  : No Does patient have [facility] services? : No Does patient have P4CC services?: No  ADL Screening (condition at time of admission) Patient's cognitive ability adequate to safely complete daily activities?: Yes Is the patient deaf or have difficulty hearing?: No Does the patient have difficulty seeing, even when wearing glasses/contacts?: No Does the patient have difficulty concentrating, remembering, or making decisions?: No Patient able to express need for assistance with ADLs?: Yes Does the patient have difficulty dressing or bathing?: No Independently performs ADLs?: Yes (appropriate for developmental age) Does the patient have difficulty walking or climbing stairs?: No Weakness of Legs: None Weakness of Arms/Hands: None  Home Assistive Devices/Equipment Home Assistive Devices/Equipment: None  Therapy Consults (therapy consults require a physician order) PT Evaluation Needed: No OT Evalulation Needed: No SLP Evaluation Needed: No Abuse/Neglect Assessment (Assessment to be complete while patient is alone) Abuse/Neglect Assessment Can Be Completed: Unable to assess, patient is non-responsive or altered mental status Values / Beliefs Cultural Requests During Hospitalization: None Spiritual Requests During Hospitalization: None Consults Spiritual Care Consult Needed: No Transition of Care Team Consult Needed: No Advance Directives (For Healthcare) Does Patient Have a Medical Advance Directive?: No          Disposition:  Patient disposition pending, Patient to  be seen by Psyc Provider Disposition Initial Assessment Completed for this Encounter: Yes  On Site Evaluation by:   Reviewed with Physician:    Benay Pike MS LCASA 03/09/2020 4:09 AM

## 2020-03-09 NOTE — ED Notes (Signed)
Hourly rounding reveals patient awake in hallway recliner. Stable, in no acute distress. Q15 minute rounds and monitoring via Psychologist, counselling to continue.

## 2020-03-09 NOTE — ED Triage Notes (Addendum)
Pt brought in by [employer] dept handcuffed.  Pt is IVC.  Pt states SI.  Pt reports etoh use tonight.  Pt denies drug use.  Pt cooperative.

## 2020-03-09 NOTE — ED Notes (Signed)
Pt given sandwich tray and ginger ale with ice at this time; no other needs voiced. Will continue to monitor Q15 minute rounds.

## 2020-03-09 NOTE — BH Assessment (Signed)
Referral information for Psychiatric Hospitalization faxed to:   [place] ([phone] or [phone])  . [facility] ([phone])  . [facility] ([phone])  . [facility] ([phone]-or- [phone])  . [facility] ([phone] -or- [phone]),  . [facility]  ([phone]) 

## 2020-03-09 NOTE — ED Notes (Signed)
Pt. Transferred from Triage to room after dressing out and screening for contraband. Report to include Situation, Background, Assessment and Recommendations from Amy, RN. Pt. Oriented to Quad including Q15 minute rounds as well as Psychologist, counselling for their protection. Patient is alert and oriented, warm and dry in no acute distress. Patient denies SI, HI, and AVH. Pt. Encouraged to let me know if needs arise.

## 2020-03-09 NOTE — ED Notes (Signed)
Security returns at this time with key for pt valuables. Key #[id number]

## 2020-03-09 NOTE — ED Notes (Signed)
Pt up to desk, pt is asking for more water and cursing about how officer made a phone call and he could not. Pt is asked to return to his seat at this time and pt becomes increasingly unhappy due to this response. Dr. Don Perking then informs pt that he needs to sit down and be respectable to our staff. Pt then sits down and holds up middle finger in direction of officer and mumbles, "bitch," then sits quietly.

## 2020-03-09 NOTE — ED Provider Notes (Signed)
[facility] Emergency Department Provider Note  ____________________________________________  Time seen: Approximately 4:33 AM  I have reviewed the triage vital signs and the nursing notes.   HISTORY  Chief Complaint Behavior Problem  Level 5 caveat:  Portions of the history and physical were unable to be obtained due to alcohol intoxication and lack of patient cooperation   HPI Ryan Faulkner is a 39 y.o. male history of alcohol abuse and major depressive disorder who was brought in under IVC by police for suicidal ideation.  Patient reports that he has been very depressed and called the crisis hotline this evening with suicidal ideation.  Patient will not tell me his plan.  Patient endorses daily alcohol use.  No signs of complicated withdrawals.   Denies any other drug use.  Denies any medical problems.  Patient is very rude to staff and refuses to answer any other questions.    Past Medical History:  Diagnosis Date  . 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 Hypertension     Patient Active Problem List   Diagnosis Date Noted  . 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 Alcohol use disorder, severe, dependence (HCC) 02/15/2020  . Delirium tremens (HCC) 12/16/2018    Past Surgical History:  Procedure Laterality Date  . TONSILLECTOMY      Prior to Admission medications   Medication Sig Start Date End Date Taking? Authorizing Provider  amLODipine (NORVASC) 10 MG tablet Take 10 mg by mouth daily.    [provider]  EPINEPHrine 0.3 mg/0.3 mL IJ SOAJ injection Inject 0.3 mg into the muscle as needed for anaphylaxis.    [provider]  escitalopram (LEXAPRO) 20 MG tablet Take 1 tablet by mouth daily.  06/27/18   [provider]  lisinopril (ZESTRIL) 10 MG tablet Take 1 tablet by mouth daily. 04/16/18   [provider]  naproxen (NAPROSYN) 500 MG  tablet Take 1 tablet (500 mg total) by mouth 2 (two) times daily with a meal. 12/09/19   Joni Reining, PA-C    Allergies Bee venom  No family history on file.  Social History Social History   Tobacco Use  . Smoking status: Current Every Day Smoker  . Smokeless tobacco: Never Used  Substance Use Topics  . Alcohol use: Yes    Alcohol/week: 24.0 standard drinks    Types: 24 Cans of beer per week  . Drug use: No    Review of Systems  Constitutional: Negative for fever. Eyes: Negative for visual changes. ENT: Negative for sore throat. Neck: No neck pain  Cardiovascular: Negative for chest pain. Respiratory: Negative for shortness of breath. Gastrointestinal: Negative for abdominal pain, vomiting or diarrhea. Genitourinary: Negative for dysuria. Musculoskeletal: Negative for back pain. Skin: Negative for rash. Neurological: Negative for headaches, weakness or numbness. Psych: No SI or HI  ____________________________________________   PHYSICAL EXAM:  VITAL SIGNS: ED Triage Vitals  Enc Vitals Group     BP 03/09/20 0239 (!) 143/105     Pulse Rate 03/09/20 0239 99     Resp 03/09/20 0239 18     Temp 03/09/20 0239 98.6 F (37 C)     Temp Source 03/09/20 0239 Oral     SpO2 03/09/20 0239 95 %     Weight 03/09/20 0240 195 lb (88.5 kg)     Height 03/09/20 0240 5\' 10"  (1.778 m)     Head Circumference --  Peak Flow --      Pain Score 03/09/20 0239 4     Pain Loc --      Pain Edu? --      Excl. in GC? --     Constitutional: Alert and oriented, clinically intoxicated.  HEENT:      Head: Normocephalic and atraumatic.         Eyes: Conjunctivae are normal. Sclera is non-icteric.       Mouth/Throat: Mucous membranes are moist.       Neck: Supple with no signs of meningismus. Cardiovascular: Regular rate and rhythm.  Respiratory: Normal respiratory effort.  Gastrointestinal: Soft, non tender, and non distended. Musculoskeletal: No edema, cyanosis, or erythema of  extremities. Neurologic: Normal speech and language. Face is symmetric. Moving all extremities. No gross focal neurologic deficits are appreciated. Skin: Skin is warm, dry and intact. No rash noted. Psychiatric: Mood and affect are normal. Speech and behavior are normal.  ____________________________________________   LABS (all labs ordered are listed, but only abnormal results are displayed)  Labs Reviewed  COMPREHENSIVE METABOLIC PANEL - Abnormal; Notable for the following components:      Result Value   CO2 21 (*)    Glucose, Bld 110 (*)    BUN <5 (*)    Calcium 8.8 (*)    Total Protein 8.5 (*)    AST 53 (*)    ALT 64 (*)    Total Bilirubin 1.5 (*)    All other components within normal limits  ETHANOL - Abnormal; Notable for the following components:   Alcohol, Ethyl (B) 336 (*)    All other components within normal limits  SALICYLATE LEVEL - Abnormal; Notable for the following components:   Salicylate Lvl <7.0 (*)    All other components within normal limits  ACETAMINOPHEN LEVEL - Abnormal; Notable for the following components:   Acetaminophen (Tylenol), Serum <10 (*)    All other components within normal limits  CBC - Abnormal; Notable for the following components:   Hemoglobin 17.3 (*)    MCHC 37.5 (*)    All other components within normal limits  SARS CORONAVIRUS 2 BY RT PCR (HOSPITAL ORDER, PERFORMED IN [facility])  URINE DRUG SCREEN, QUALITATIVE ([facility] ONLY)   ____________________________________________  EKG  none  ____________________________________________  RADIOLOGY  none  ____________________________________________   PROCEDURES  Procedure(s) performed: None Procedures Critical Care performed:  None ____________________________________________   INITIAL IMPRESSION / ASSESSMENT AND PLAN / ED COURSE  39 y.o. male history of alcohol abuse and major depressive disorder who was brought in under IVC by police for suicidal ideation.   History is limited due to patient's lack of cooperation and intoxication.  Is clearly intoxicated with normal vital signs, no signs of trauma.  No medical complaints.  Patient arrives under IVC.  Will keep IVC papers. Labs for medical clearance showing stable mild transaminitis, EtOH of 336. VS WNL. Will consult psychiatry.  The patient has been placed in psychiatric observation due to the need to provide a safe environment for the patient while obtaining psychiatric consultation and evaluation, as well as ongoing medical and medication management to treat the patient's condition.  The patient has been placed under full IVC at this time.       Please note:  Patient was evaluated in Emergency Department today for the symptoms described in the history of present illness. Patient was evaluated in the context of the global COVID-19 pandemic, which necessitated consideration that the patient might  be at risk for infection with the SARS-CoV-2 virus that causes COVID-19. Institutional protocols and algorithms that pertain to the evaluation of patients at risk for COVID-19 are in a state of rapid change based on information released by regulatory bodies including the CDC and federal and state organizations. These policies and algorithms were followed during the patient's care in the ED.  Some ED evaluations and interventions may be delayed as a result of limited staffing during the pandemic.   ____________________________________________   FINAL CLINICAL IMPRESSION(S) / ED DIAGNOSES   Final diagnoses:  Alcohol abuse with intoxication (HCC)  Severe episode of recurrent major depressive disorder, without psychotic features (HCC)      NEW MEDICATIONS STARTED DURING THIS VISIT:  ED Discharge Orders    None       Note:  This document was prepared using Dragon voice recognition software and may include unintentional dictation errors.    Don Perking, [place], MD 03/09/20 [phone]

## 2020-03-09 NOTE — BH Assessment (Addendum)
PATIENT IS SCHEDULED FOR ADMISSION TOMORROW MORNING   Patient has been accepted to [facility]   Patient assigned to: [facility]  Accepting physician is Dr. Daphane Shepherd Call report to [phone] Representative was [facility]    ER Staff is aware of it:  [facility] ER Gerrit Heck, ER MD  Amy Patient's Nurse  Patient has not provided consent for family/support sytem to be contacted at this time

## 2020-03-09 NOTE — ED Notes (Signed)
Hourly rounding reveals patient asleep in room. No complaints, stable, in no acute distress. Q15 minute rounds and monitoring via Rover and Officer to continue.  

## 2020-03-09 NOTE — ED Notes (Signed)
Psych consulting pt at this time

## 2020-03-09 NOTE — ED Notes (Signed)
Pt comes to [facility] at this time, triage nurse provides this nurse with paper of phone numbers and website. This paper is placed in pt belongings bag.

## 2020-03-09 NOTE — ED Notes (Signed)
Hourly rounding reveals patient in room. No complaints, stable, in no acute distress. Q15 minute rounds and monitoring via Security Cameras to continue. 

## 2020-03-09 NOTE — ED Notes (Signed)
Dr. Don Perking notified of Pt critical alcohol level - 336 at this time. No orders currenlty

## 2020-03-09 NOTE — ED Notes (Signed)
Breakfast tray was given with juice. 

## 2020-03-09 NOTE — ED Notes (Signed)
Pt provided with 2 cups of water

## 2020-03-09 NOTE — ED Notes (Signed)
IVC  GOING  [facility]  [facility]  IN  THE  [place]

## 2020-03-10 NOTE — ED Notes (Signed)
Hourly rounding reveals patient in room. No complaints, stable, in no acute distress. Q15 minute rounds and monitoring via Security Cameras to continue. 

## 2020-03-10 NOTE — ED Provider Notes (Signed)
Emergency Medicine Observation Re-evaluation Note  Ryan Faulkner is a 39 y.o. male, seen on rounds today.  Pt initially presented to the ED for complaints of Behavior Problem Currently, the patient is without acute complaints.  Physical Exam  BP (!) 164/105 (BP Location: Right Arm)   Pulse (!) 113   Temp 98.9 F (37.2 C) (Oral)   Resp 18   Ht 5\' 10"  (1.778 m)   Wt 88.5 kg   SpO2 99%   BMI 27.98 kg/m  Physical Exam General: nad Psych: calm, not agitated  ED Course / MDM  EKG:    I have reviewed the labs performed to date as well as medications administered while in observation.  Recent changes in the last 24 hours include improving alc withdrawal. .  Plan  Current plan is for psych reassessment, [facility]. Patient is not under full IVC at this time.   , MD 03/10/20 [phone]

## 2020-03-10 NOTE — ED Notes (Addendum)
Pt asked for snack, tech informed pt that snack time as over and it was too late for anything. Pt went back to bedroom.   lw edt

## 2020-07-06 ENCOUNTER — Encounter (HOSPITAL_COMMUNITY): Payer: Self-pay | Admitting: Internal Medicine

## 2020-07-06 ENCOUNTER — Inpatient Hospital Stay (HOSPITAL_COMMUNITY)
Admission: EM | Admit: 2020-07-06 | Discharge: 2020-07-14 | DRG: 917 | Disposition: A | Discharge status: Other Acute Inpatient Hospital | Payer: BC Managed Care – PPO | Attending: Internal Medicine | Admitting: Internal Medicine

## 2020-07-06 ENCOUNTER — Emergency Department (HOSPITAL_COMMUNITY): Payer: BC Managed Care – PPO

## 2020-07-06 ENCOUNTER — Other Ambulatory Visit: Payer: Self-pay

## 2020-07-06 ENCOUNTER — Inpatient Hospital Stay (HOSPITAL_COMMUNITY): Payer: BC Managed Care – PPO

## 2020-07-06 DIAGNOSIS — R52 Pain, unspecified: Secondary | ICD-10-CM

## 2020-07-06 DIAGNOSIS — G928 Other toxic encephalopathy: Secondary | ICD-10-CM | POA: Diagnosis present

## 2020-07-06 DIAGNOSIS — G894 Chronic pain syndrome: Secondary | ICD-10-CM | POA: Diagnosis present

## 2020-07-06 DIAGNOSIS — R7401 Elevation of levels of liver transaminase levels: Secondary | ICD-10-CM | POA: Diagnosis present

## 2020-07-06 DIAGNOSIS — E876 Hypokalemia: Secondary | ICD-10-CM | POA: Diagnosis present

## 2020-07-06 DIAGNOSIS — F319 Bipolar disorder, unspecified: Secondary | ICD-10-CM | POA: Diagnosis present

## 2020-07-06 DIAGNOSIS — G249 Dystonia, unspecified: Secondary | ICD-10-CM | POA: Diagnosis present

## 2020-07-06 DIAGNOSIS — Z9151 Personal history of suicidal behavior: Secondary | ICD-10-CM

## 2020-07-06 DIAGNOSIS — F17200 Nicotine dependence, unspecified, uncomplicated: Secondary | ICD-10-CM | POA: Diagnosis present

## 2020-07-06 DIAGNOSIS — R471 Dysarthria and anarthria: Secondary | ICD-10-CM | POA: Diagnosis present

## 2020-07-06 DIAGNOSIS — T50902A Poisoning by unspecified drugs, medicaments and biological substances, intentional self-harm, initial encounter: Secondary | ICD-10-CM | POA: Diagnosis not present

## 2020-07-06 DIAGNOSIS — F418 Other specified anxiety disorders: Secondary | ICD-10-CM | POA: Diagnosis present

## 2020-07-06 DIAGNOSIS — T43592A Poisoning by other antipsychotics and neuroleptics, intentional self-harm, initial encounter: Principal | ICD-10-CM | POA: Diagnosis present

## 2020-07-06 DIAGNOSIS — F1290 Cannabis use, unspecified, uncomplicated: Secondary | ICD-10-CM | POA: Diagnosis present

## 2020-07-06 DIAGNOSIS — F314 Bipolar disorder, current episode depressed, severe, without psychotic features: Secondary | ICD-10-CM | POA: Diagnosis not present

## 2020-07-06 DIAGNOSIS — R4182 Altered mental status, unspecified: Secondary | ICD-10-CM | POA: Diagnosis not present

## 2020-07-06 DIAGNOSIS — F1020 Alcohol dependence, uncomplicated: Secondary | ICD-10-CM | POA: Diagnosis present

## 2020-07-06 DIAGNOSIS — Z20822 Contact with and (suspected) exposure to covid-19: Secondary | ICD-10-CM | POA: Diagnosis present

## 2020-07-06 DIAGNOSIS — W19XXXA Unspecified fall, initial encounter: Secondary | ICD-10-CM | POA: Diagnosis present

## 2020-07-06 DIAGNOSIS — Z781 Physical restraint status: Secondary | ICD-10-CM

## 2020-07-06 DIAGNOSIS — Z91030 Bee allergy status: Secondary | ICD-10-CM | POA: Diagnosis not present

## 2020-07-06 DIAGNOSIS — T50904A Poisoning by unspecified drugs, medicaments and biological substances, undetermined, initial encounter: Secondary | ICD-10-CM | POA: Diagnosis present

## 2020-07-06 DIAGNOSIS — E66811 Obesity, class 1: Secondary | ICD-10-CM

## 2020-07-06 DIAGNOSIS — F332 Major depressive disorder, recurrent severe without psychotic features: Secondary | ICD-10-CM | POA: Diagnosis not present

## 2020-07-06 DIAGNOSIS — Z6830 Body mass index (BMI) 30.0-30.9, adult: Secondary | ICD-10-CM | POA: Diagnosis not present

## 2020-07-06 DIAGNOSIS — E6609 Other obesity due to excess calories: Secondary | ICD-10-CM | POA: Diagnosis present

## 2020-07-06 DIAGNOSIS — R45851 Suicidal ideations: Secondary | ICD-10-CM | POA: Diagnosis present

## 2020-07-06 DIAGNOSIS — I10 Essential (primary) hypertension: Secondary | ICD-10-CM | POA: Diagnosis present

## 2020-07-06 DIAGNOSIS — R7989 Other specified abnormal findings of blood chemistry: Secondary | ICD-10-CM | POA: Diagnosis present

## 2020-07-06 DIAGNOSIS — Z5900 Homelessness unspecified: Secondary | ICD-10-CM | POA: Diagnosis not present

## 2020-07-06 DIAGNOSIS — I119 Hypertensive heart disease without heart failure: Secondary | ICD-10-CM | POA: Diagnosis present

## 2020-07-06 DIAGNOSIS — R748 Abnormal levels of other serum enzymes: Secondary | ICD-10-CM | POA: Diagnosis present

## 2020-07-06 DIAGNOSIS — Z791 Long term (current) use of non-steroidal anti-inflammatories (NSAID): Secondary | ICD-10-CM

## 2020-07-06 HISTORY — DX: Alcohol dependence, uncomplicated: F10.20

## 2020-07-06 HISTORY — DX: Suicidal ideations: R45.851

## 2020-07-06 LAB — CBC WITH DIFFERENTIAL/PLATELET
Abs Immature Granulocytes: 0.06 10*3/uL (ref 0.00–0.07)
Basophils Absolute: 0 10*3/uL (ref 0.0–0.1)
Basophils Relative: 0 %
Eosinophils Absolute: 0.1 10*3/uL (ref 0.0–0.5)
Eosinophils Relative: 1 %
HCT: 44.3 % (ref 39.0–52.0)
Hemoglobin: 14.8 g/dL (ref 13.0–17.0)
Immature Granulocytes: 1 %
Lymphocytes Relative: 19 %
Lymphs Abs: 1.8 10*3/uL (ref 0.7–4.0)
MCH: 31.4 pg (ref 26.0–34.0)
MCHC: 33.4 g/dL (ref 30.0–36.0)
MCV: 93.9 fL (ref 80.0–100.0)
Monocytes Absolute: 0.9 10*3/uL (ref 0.1–1.0)
Monocytes Relative: 9 %
Neutro Abs: 6.6 10*3/uL (ref 1.7–7.7)
Neutrophils Relative %: 70 %
Platelets: 166 10*3/uL (ref 150–400)
RBC: 4.72 MIL/uL (ref 4.22–5.81)
RDW: 13.1 % (ref 11.5–15.5)
WBC: 9.5 10*3/uL (ref 4.0–10.5)
nRBC: 0 % (ref 0.0–0.2)

## 2020-07-06 LAB — COMPREHENSIVE METABOLIC PANEL
ALT: 70 U/L — ABNORMAL HIGH (ref 0–44)
AST: 70 U/L — ABNORMAL HIGH (ref 15–41)
Albumin: 3.8 g/dL (ref 3.5–5.0)
Alkaline Phosphatase: 67 U/L (ref 38–126)
Anion gap: 14 (ref 5–15)
BUN: 6 mg/dL (ref 6–20)
CO2: 23 mmol/L (ref 22–32)
Calcium: 9.2 mg/dL (ref 8.9–10.3)
Chloride: 98 mmol/L (ref 98–111)
Creatinine, Ser: 0.72 mg/dL (ref 0.61–1.24)
GFR, Estimated: >60 mL/min (ref >60)
Glucose, Bld: 144 mg/dL — ABNORMAL HIGH (ref 70–99)
Potassium: 3.8 mmol/L (ref 3.5–5.1)
Sodium: 135 mmol/L (ref 135–145)
Total Bilirubin: 1.2 mg/dL (ref 0.3–1.2)
Total Protein: 6.9 g/dL (ref 6.5–8.1)

## 2020-07-06 LAB — RESP PANEL BY RT-PCR (FLU A&B, COVID) ARPGX2
Influenza A by PCR: NEGATIVE
Influenza B by PCR: NEGATIVE
SARS Coronavirus 2 by RT PCR: NEGATIVE

## 2020-07-06 LAB — I-STAT ARTERIAL BLOOD GAS, ED
Acid-base deficit: 3 mmol/L — ABNORMAL HIGH (ref 0.0–2.0)
Bicarbonate: 23.5 mmol/L (ref 20.0–28.0)
Calcium, Ion: 1.21 mmol/L (ref 1.15–1.40)
HCT: 44 % (ref 39.0–52.0)
Hemoglobin: 15 g/dL (ref 13.0–17.0)
O2 Saturation: 94 %
Patient temperature: 97.6
Potassium: 3.6 mmol/L (ref 3.5–5.1)
Sodium: 135 mmol/L (ref 135–145)
TCO2: 25 mmol/L (ref 22–32)
pCO2 arterial: 43 mmHg (ref 32.0–48.0)
pH, Arterial: 7.343 — ABNORMAL LOW (ref 7.350–7.450)
pO2, Arterial: 73 mmHg — ABNORMAL LOW (ref 83.0–108.0)

## 2020-07-06 LAB — RAPID URINE DRUG SCREEN, HOSP PERFORMED
Amphetamines: NOT DETECTED
Barbiturates: NOT DETECTED
Benzodiazepines: NOT DETECTED
Cocaine: NOT DETECTED
Opiates: NOT DETECTED
Tetrahydrocannabinol: NOT DETECTED

## 2020-07-06 LAB — CBG MONITORING, ED: Glucose-Capillary: 138 mg/dL — ABNORMAL HIGH (ref 70–99)

## 2020-07-06 LAB — ETHANOL: Alcohol, Ethyl (B): 96 mg/dL — ABNORMAL HIGH (ref <10)

## 2020-07-06 LAB — ACETAMINOPHEN LEVEL: Acetaminophen (Tylenol), Serum: <10 ug/mL — ABNORMAL LOW (ref 10–30)

## 2020-07-06 MED ORDER — ONDANSETRON HCL 4 MG/2ML IJ SOLN: 4.0000 mg | Freq: Four times a day (QID) | INTRAMUSCULAR | Status: DC | PRN

## 2020-07-06 MED ORDER — POLYETHYLENE GLYCOL 3350 17 G PO PACK: 17.0000 g | PACK | Freq: Every day | ORAL | Status: DC | PRN

## 2020-07-06 MED ORDER — LORAZEPAM 2 MG/ML IJ SOLN
1.0000 mg | INTRAMUSCULAR | Status: AC | PRN
  Administered 2020-07-06 – 2020-07-07 (×4): 2 mg via INTRAVENOUS
  Administered 2020-07-07: 3 mg via INTRAVENOUS
  Administered 2020-07-07 (×2): 2 mg via INTRAVENOUS
  Administered 2020-07-07: 1 mg via INTRAVENOUS
  Filled 2020-07-06 (×7): qty 1

## 2020-07-06 MED ORDER — SODIUM CHLORIDE 0.9 % IV SOLN: INTRAVENOUS | Status: DC

## 2020-07-06 MED ORDER — SODIUM CHLORIDE 0.9 % IV SOLN
3.0000 g | Freq: Four times a day (QID) | INTRAVENOUS | Status: DC
  Administered 2020-07-06 – 2020-07-07 (×4): 3 g via INTRAVENOUS
  Filled 2020-07-06: qty 3
  Filled 2020-07-06: qty 8
  Filled 2020-07-06: qty 3
  Filled 2020-07-06: qty 8
  Filled 2020-07-06: qty 3
  Filled 2020-07-06: qty 8
  Filled 2020-07-06: qty 3

## 2020-07-06 MED ORDER — ENOXAPARIN SODIUM 40 MG/0.4ML ~~LOC~~ SOLN
40.0000 mg | SUBCUTANEOUS | Status: DC
  Administered 2020-07-06 – 2020-07-11 (×6): 40 mg via SUBCUTANEOUS
  Filled 2020-07-06 (×8): qty 0.4

## 2020-07-06 MED ORDER — SODIUM CHLORIDE 0.9 % IV BOLUS
1000.0000 mL | Freq: Once | INTRAVENOUS | Status: AC
  Administered 2020-07-06: 1000 mL via INTRAVENOUS

## 2020-07-06 MED ORDER — THIAMINE HCL 100 MG/ML IJ SOLN
100.0000 mg | Freq: Every day | INTRAMUSCULAR | Status: DC
  Administered 2020-07-06: 100 mg via INTRAVENOUS
  Filled 2020-07-06: qty 2

## 2020-07-06 MED ORDER — HYDRALAZINE HCL 20 MG/ML IJ SOLN
5.0000 mg | INTRAMUSCULAR | Status: DC | PRN
  Administered 2020-07-07: 5 mg via INTRAVENOUS
  Filled 2020-07-06: qty 1

## 2020-07-06 MED ORDER — LORAZEPAM 1 MG PO TABS
1.0000 mg | ORAL_TABLET | ORAL | Status: AC | PRN
Start: 2020-07-06 — End: 2020-07-09

## 2020-07-06 MED ORDER — LACTATED RINGERS IV BOLUS
1000.0000 mL | Freq: Once | INTRAVENOUS | Status: AC
  Administered 2020-07-06: 1000 mL via INTRAVENOUS

## 2020-07-06 MED ORDER — THIAMINE HCL 100 MG PO TABS: 100.0000 mg | ORAL_TABLET | Freq: Every day | ORAL | Status: DC

## 2020-07-06 MED ORDER — FOLIC ACID 1 MG PO TABS: 1.0000 mg | ORAL_TABLET | Freq: Every day | ORAL | Status: DC

## 2020-07-06 MED ORDER — FOLIC ACID 5 MG/ML IJ SOLN
1.0000 mg | Freq: Every day | INTRAMUSCULAR | Status: DC
  Administered 2020-07-06 – 2020-07-09 (×4): 1 mg via INTRAVENOUS
  Filled 2020-07-06 (×4): qty 0.2

## 2020-07-06 MED ORDER — ACETAMINOPHEN 650 MG RE SUPP: 650.0000 mg | Freq: Four times a day (QID) | RECTAL | Status: DC | PRN

## 2020-07-06 MED ORDER — BISACODYL 5 MG PO TBEC: 5.0000 mg | DELAYED_RELEASE_TABLET | Freq: Every day | ORAL | Status: DC | PRN

## 2020-07-06 MED ORDER — LORAZEPAM 2 MG/ML IJ SOLN
0.0000 mg | INTRAMUSCULAR | Status: AC
  Administered 2020-07-06 (×2): 1 mg via INTRAVENOUS
  Administered 2020-07-07 (×3): 2 mg via INTRAVENOUS
  Administered 2020-07-07: 3 mg via INTRAVENOUS
  Administered 2020-07-07 (×2): 2 mg via INTRAVENOUS
  Administered 2020-07-08: 3 mg via INTRAVENOUS
  Filled 2020-07-06: qty 2
  Filled 2020-07-06 (×5): qty 1
  Filled 2020-07-06: qty 2
  Filled 2020-07-06 (×2): qty 1
  Filled 2020-07-06: qty 2

## 2020-07-06 MED ORDER — LORAZEPAM 2 MG/ML IJ SOLN: 0.0000 mg | Freq: Three times a day (TID) | INTRAMUSCULAR | Status: AC

## 2020-07-06 MED ORDER — ADULT MULTIVITAMIN W/MINERALS CH
1.0000 | ORAL_TABLET | Freq: Every day | ORAL | Status: DC
  Administered 2020-07-09 – 2020-07-14 (×6): 1 via ORAL
  Filled 2020-07-06 (×6): qty 1

## 2020-07-06 MED ORDER — ONDANSETRON HCL 4 MG PO TABS: 4.0000 mg | ORAL_TABLET | Freq: Four times a day (QID) | ORAL | Status: DC | PRN

## 2020-07-06 MED ORDER — SODIUM CHLORIDE 0.9% FLUSH
3.0000 mL | Freq: Two times a day (BID) | INTRAVENOUS | Status: DC
  Administered 2020-07-06 – 2020-07-12 (×9): 3 mL via INTRAVENOUS

## 2020-07-06 MED ORDER — ACETAMINOPHEN 325 MG PO TABS
650.0000 mg | ORAL_TABLET | Freq: Four times a day (QID) | ORAL | Status: DC | PRN
  Administered 2020-07-13 – 2020-07-14 (×2): 650 mg via ORAL
  Filled 2020-07-06 (×3): qty 2

## 2020-07-06 MED ORDER — NICOTINE 14 MG/24HR TD PT24
14.0000 mg | MEDICATED_PATCH | Freq: Every day | TRANSDERMAL | Status: DC
  Administered 2020-07-07 – 2020-07-08 (×2): 14 mg via TRANSDERMAL
  Filled 2020-07-06 (×2): qty 1

## 2020-07-06 MED ORDER — MORPHINE SULFATE (PF) 2 MG/ML IV SOLN
2.0000 mg | INTRAVENOUS | Status: DC | PRN
  Administered 2020-07-07 – 2020-07-08 (×3): 2 mg via INTRAVENOUS
  Filled 2020-07-06 (×3): qty 1

## 2020-07-06 MED ORDER — ALBUTEROL SULFATE (2.5 MG/3ML) 0.083% IN NEBU: 2.5000 mg | INHALATION_SOLUTION | RESPIRATORY_TRACT | Status: DC | PRN

## 2020-07-06 MED ORDER — THIAMINE HCL 100 MG/ML IJ SOLN
500.0000 mg | Freq: Every day | INTRAMUSCULAR | Status: DC
  Filled 2020-07-06: qty 6

## 2020-07-06 NOTE — ED Provider Notes (Signed)
[facility] EMERGENCY DEPARTMENT Provider Note   CSN: [id number] Arrival date & time: 07/06/20  1250     History Chief Complaint  Patient presents with  . Drug Overdose    Ryan Faulkner is a 40 y.o. male.  40 year old male presents via EMS after reported overdose of unknown amount of Seroquel.  Patient reportedly took these pills 6 hours ago.  Patient may have also taken BuSpar.  EMS was called after patient found unresponsive by his girlfriend.  On their arrival, he was able to protect his airway.  They did place a nasal trumpet for precautionary measures.  CBG was over 100.  Was given 4 mg of Narcan without relief.  Transported here for further management        Past Medical History:  Diagnosis Date  . 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 Hypertension     Patient Active Problem List   Diagnosis Date Noted  . 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 Alcohol use disorder, severe, dependence (HCC) 02/15/2020  . Delirium tremens (HCC) 12/16/2018    Past Surgical History:  Procedure Laterality Date  . TONSILLECTOMY         No family history on file.  Social History   Tobacco Use  . Smoking status: Current Every Day Smoker  . Smokeless tobacco: Never Used  Substance Use Topics  . Alcohol use: Yes    Alcohol/week: 24.0 standard drinks    Types: 24 Cans of beer per week  . Drug use: No    Home Medications Prior to Admission medications   Medication Sig Start Date End Date Taking? Authorizing Provider  amLODipine (NORVASC) 10 MG tablet Take 10 mg by mouth daily.    [provider]  EPINEPHrine 0.3 mg/0.3 mL IJ SOAJ injection Inject 0.3 mg into the muscle as needed for anaphylaxis.    [provider]  escitalopram (LEXAPRO) 20 MG tablet Take 1 tablet by mouth daily.  06/27/18   [provider]  lisinopril (ZESTRIL) 10 MG tablet Take 1 tablet by  mouth daily. 04/16/18   [provider]  naproxen (NAPROSYN) 500 MG tablet Take 1 tablet (500 mg total) by mouth 2 (two) times daily with a meal. 12/09/19   Joni Reining, PA-C    Allergies    Bee venom  Review of Systems   Review of Systems  Unable to perform ROS: Psychiatric disorder    Physical Exam Updated Vital Signs BP (!) 130/96 (BP Location: Right Arm)   Pulse (!) 126   Resp 14   Ht 1.778 m (5\' 10" )   Wt 95.3 kg   SpO2 100%   BMI 30.13 kg/m   Physical Exam Vitals and nursing note reviewed.  Constitutional:      General: He is not in acute distress.    Appearance: Normal appearance. He is well-developed and well-nourished. He is not toxic-appearing.  HENT:     Head: Normocephalic and atraumatic.  Eyes:     General: Lids are normal.     Extraocular Movements: EOM normal.     Conjunctiva/sclera: Conjunctivae normal.     Pupils: Pupils are equal, round, and reactive to light.  Neck:     Thyroid: No thyroid mass.     Trachea: No tracheal deviation.  Cardiovascular:     Rate and Rhythm: Regular rhythm. Bradycardia present.     Heart sounds: Normal heart sounds. No  murmur heard. No gallop.   Pulmonary:     Effort: Pulmonary effort is normal. No respiratory distress.     Breath sounds: Normal breath sounds. No stridor. No decreased breath sounds, wheezing, rhonchi or rales.  Abdominal:     General: Bowel sounds are normal. There is no distension.     Palpations: Abdomen is soft.     Tenderness: There is no abdominal tenderness. There is no CVA tenderness or rebound.  Musculoskeletal:        General: No tenderness or edema. Normal range of motion.     Cervical back: Normal range of motion and neck supple.  Skin:    General: Skin is warm and dry.     Findings: No abrasion or rash.  Neurological:     Mental Status: He is lethargic and disoriented.     GCS: GCS eye subscore is 2. GCS verbal subscore is 3. GCS motor subscore is 4.     Cranial Nerves: No  facial asymmetry.     Motor: No tremor.     Deep Tendon Reflexes: Strength normal.  Psychiatric:        Attention and Perception: He is inattentive.        Mood and Affect: Mood and affect normal.     ED Results / Procedures / Treatments   Labs (all labs ordered are listed, but only abnormal results are displayed) Labs Reviewed  RESP PANEL BY RT-PCR (FLU A&B, COVID) [id number]  COMPREHENSIVE METABOLIC PANEL  ETHANOL  ACETAMINOPHEN LEVEL  CBC  RAPID URINE DRUG SCREEN, HOSP PERFORMED  RAPID URINE DRUG SCREEN, HOSP PERFORMED  CBC WITH DIFFERENTIAL/PLATELET  BLOOD GAS, ARTERIAL  CBG MONITORING, ED    EKG None  Radiology No results found.  Procedures Procedures (including critical care time)  Medications Ordered in ED Medications  0.9 %  sodium chloride infusion (has no administration in time range)    ED Course  I have reviewed the triage vital signs and the nursing notes.  Pertinent labs & imaging results that were available during my care of the patient were reviewed by me and considered in my medical decision making (see chart for details).    MDM Rules/Calculators/A&P                          Patient's blood gas noted here and shows no evidence of CO2 retention.  He is able to protect his airway here.  Poison control contacted and their recommendations noted.  Head CT and chest x-ray without acute findings.  Does have elevated alcohol level as well 2.  Rechecked multiple times and continues to be sleeping and only arousable to deep stimulation.  Will admit to the hospitalist service for serial monitoring   CRITICAL CARE Performed by: Toy Baker Total critical care time: 50 minutes Critical care time was exclusive of separately billable procedures and treating other patients. Critical care was necessary to treat or prevent imminent or life-threatening deterioration. Critical care was time spent personally by me on the following activities: development of  treatment plan with patient and/or surrogate as well as nursing, discussions with consultants, evaluation of patient's response to treatment, examination of patient, obtaining history from patient or surrogate, ordering and performing treatments and interventions, ordering and review of laboratory studies, ordering and review of radiographic studies, pulse oximetry and re-evaluation of patient's condition.  Final Clinical Impression(s) / ED Diagnoses Final diagnoses:  None    Rx / DC Orders  ED Discharge Orders    None       Lorre Nick, MD 07/06/20 1444

## 2020-07-06 NOTE — ED Triage Notes (Signed)
Arrived via EMS; found face down in a hotel room at approx 0700, reported taking unknown amount of Seroquel d/t depression and argument with significant other, unknown other substance use. EMS endorsed given narcan w/ no change. Patient arrived in ED w/ NPA in placed; spontaneous respiration. RA Sp02 100%; Responds to pain,

## 2020-07-06 NOTE — Progress Notes (Signed)
EEG complete - results pending 

## 2020-07-06 NOTE — ED Notes (Signed)
Poison control 800 [id number] case # [id number]; watch out for CNS depression, NV, tachycardia, hypotension, anticholinergic effects, prolonged QTC (12 lead EKG report, vitals and patient's current state shared with Misty Stanley w/ poison control as well. Recommends IV fluids, cardiac monitor, avoid Geodon and Haldol. Keep Mag and potassium level at the high end of normal. Will call back for updates, but reach out sooner if [place].
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
## 2020-07-06 NOTE — Consult Note (Addendum)
NAME:  Ryan Faulkner, MRN:  [id number], DOB:  01/03/1981, LOS: 0 ADMISSION DATE:  07/06/2020, CONSULTATION DATE:  07/06/20 REFERRING MD:  Ophelia Charter - [facility] , CHIEF COMPLAINT:  Encephalopathy // intentional overdose   Brief History:  40 yo with MDD + SI in ED following intentional ingestion of 6g of Seroquel.  History of Present Illness:  40 yo M PMH MDD with SI, HTN, EtOH use disorder, Tobacco use disorder. Found down in hotel room 07/06/20 0700, had involuntary movements of extremities, encephalopathy. Per patient's mother, pt left communication indicative of SI, and took 6g Seroquel (possible co BusPar ingestion as well).  Pt with no response to narcan with EMS. In ED, CT H unremarkable for acute intracranial process. Ethyl alcohol 96. APAP < 10. QTc 486. Normotensive, tachycardic with HR 120s  Poison Control engaged - rec IVF, cardiac monitoring, optimize mag and K, avoidance of antipsychotics  Neurology consulted and recommend critical care consultation   Past Medical History:  MDD with SI EtOH abuse Tobacco use disorder HTN   Significant Hospital Events:  1/11 ED for encephalopathy after being found in hotel room following intentional seroquel ingestion (6g) in apparent Suicide attempt. [facility] consulted for admit. Neuro consulted for recs. PCCM consulted    Consults:  PCCM Neuro  Procedures:    Significant Diagnostic Tests:  1/11 CT H> no acute intracranial abnormality 1/11 EEG >>>   Micro Data:   1/11 SARS Cov2> neg  Antimicrobials:    Interim History / Subjective:  Seen by neurology  [facility] has been able to contact pts mother   Objective   Blood pressure (!) 144/94, pulse (!) 127, temperature (!) 96.7 F (35.9 C), temperature source Axillary, resp. rate 18, height 5\' 10"  (1.778 m), weight 95.3 kg, SpO2 100 %.       No intake or output data in the 24 hours ending 07/06/20 1645 Filed Weights   07/06/20 1259  Weight: 95.3 kg    Examination: General: Acutely and  chronically ill appearing adult M supine NAD  HENT: Nasal trumpet. Tacky mm. Flushed face. Mildly injected sclera, anicteric Lungs: Symmetrical chest expansion, even. No accessory use. CTAb Cardiovascular: tachycardic rate reg rhythm cap refill brisk  Abdomen: Soft round + bowel sounds Extremities: No obvious joint deformity no cyanosis or clubbing no edema Neuro: Awakens to noxious stimuli. Follows simple commands. Mild hypertonicity BUE. Lower extremity reflexia difficult to discern due to pt spontaneous movement. Protecting airway. PERRL 92mm. Akasthesias GU: condom cath   [facility] Problem list     Assessment & Plan:   MDD with SI -home meds: depakote ER 500mg , buspar 10mg  BID, lexapro 20mg , seroquel 200mg  P -holding home meds -needs psych consult  Acute toxic metabolic encephalopathy in setting of intentional overdose: Seroquel overdose, intentional -is possible that pt also ingested other home meds  -sx mostly c/w EPS -- not currently c/w NMS -not c/w anticholinergic tox, serotonin syndrome at this time P -poison control engaged in ED -Neuro consulted, recs are pending -cont IVF -- I have ordered additional bolus but anticipate pt will need more aggressive ongoing IVF -cardiac monitoring, cont QTc monitoring, monitor for airway protection, serial ECGs  -optimize mag, K, calcium -- I will add on an ionized cal to these labs -check CK, LDH -benadryl PRN for extrapyramidal symptoms  -monitor for s/sx NMS -check VPA level  -if QRS prolonged, could consider bicarb gtt, but would rec discussing with poison control first   Bibasilar pulmonary opacities -infiltrates vs atelectasis on  CXR -clinically, very possible that pt aspirated -- rec short course of empiric abx   Transaminitis, mild -in setting of etoh use disorder vs overdose above P -trend LFTs  EtOH abuse disorder P -CIWA -cessation counseling when appropriate    Thank you for consulting critical care.  At this time the patient remains stable for non-ICU admission. PCCM will sign off. Please re-engage if we can be of further assistance or if patient's clinical status changes.   Best practice (evaluated daily)  Diet: NPO Pain/Anxiety/Delirium protocol (if indicated): na VAP protocol (if indicated): na DVT prophylaxis: lovenox GI prophylaxis: na Glucose control: SSI Mobility: BR Disposition:SDU   Goals of Care:  Last date of multidisciplinary goals of care discussion:-- Family and staff present: -- Summary of discussion: -- Follow up goals of care discussion due: -- Code Status: Full  Labs   CBC: Recent Labs  Lab 07/06/20 1307 07/06/20 1438  WBC 9.5  --   NEUTROABS 6.6  --   HGB 14.8 15.0  HCT 44.3 44.0  MCV 93.9  --   PLT 166  --     Basic Metabolic Panel: Recent Labs  Lab 07/06/20 1307 07/06/20 1438  NA 135 135  K 3.8 3.6  CL 98  --   CO2 23  --   GLUCOSE 144*  --   BUN 6  --   CREATININE 0.72  --   CALCIUM 9.2  --    GFR: Estimated Creatinine Clearance: 143.6 mL/min (by C-G formula based on SCr of 0.72 mg/dL). Recent Labs  Lab 07/06/20 1307  WBC 9.5    Liver Function Tests: Recent Labs  Lab 07/06/20 1307  AST 70*  ALT 70*  ALKPHOS 67  BILITOT 1.2  PROT 6.9  ALBUMIN 3.8   No results for input(s): LIPASE, AMYLASE in the last 168 hours. No results for input(s): AMMONIA in the last 168 hours.  ABG    Component Value Date/Time   PHART 7.343 (L) 07/06/2020 1438   PCO2ART 43.0 07/06/2020 1438   PO2ART 73 (L) 07/06/2020 1438   HCO3 23.5 07/06/2020 1438   TCO2 25 07/06/2020 1438   ACIDBASEDEF 3.0 (H) 07/06/2020 1438   O2SAT 94.0 07/06/2020 1438     Coagulation Profile: No results for input(s): INR, PROTIME in the last 168 hours.  Cardiac Enzymes: No results for input(s): CKTOTAL, CKMB, CKMBINDEX, TROPONINI in the last 168 hours.  HbA1C: No results found for: HGBA1C  CBG: No results for input(s): GLUCAP in the last 168  hours.  Review of Systems:   Unable to obtain due to encephalopathy   Past Medical History:  He,  has a past medical history of Alcohol dependence (HCC), Bee sting allergy, Hypertension, and Suicidal ideation.   Surgical History:   Past Surgical History:  Procedure Laterality Date  . TONSILLECTOMY       Social History:   reports that he has been smoking. He has never used smokeless tobacco. He reports current alcohol use of about 24.0 standard drinks of alcohol per week. He reports that he does not use drugs.   Family History:  His family history is not on file.   Allergies Allergies  Allergen Reactions  . Bee Venom Anaphylaxis     Home Medications  Prior to Admission medications   Medication Sig Start Date End Date Taking? Authorizing Provider  amLODipine (NORVASC) 10 MG tablet Take 10 mg by mouth daily.    [provider]  busPIRone (BUSPAR) 10 MG tablet Take 10  mg by mouth 2 (two) times daily. 06/17/20   [provider]  divalproex (DEPAKOTE ER) 500 MG 24 hr tablet Take 500 mg by mouth at bedtime. 06/17/20   [provider]  EPINEPHrine 0.3 mg/0.3 mL IJ SOAJ injection Inject 0.3 mg into the muscle as needed for anaphylaxis.    [provider]  escitalopram (LEXAPRO) 20 MG tablet Take 1 tablet by mouth daily.  06/27/18   [provider]  lisinopril (ZESTRIL) 10 MG tablet Take 1 tablet by mouth daily. 04/16/18   [provider]  naproxen (NAPROSYN) 500 MG tablet Take 1 tablet (500 mg total) by mouth 2 (two) times daily with a meal. 12/09/19   Joni Reining, PA-C  QUEtiapine (SEROQUEL) 200 MG tablet Take 200 mg by mouth at bedtime. 06/17/20   [provider]     Tessie Fass MSN, AGACNP-BC [facility] [phone] If no answer, [phone] 07/06/2020, 5:25 PM

## 2020-07-06 NOTE — ED Notes (Signed)
EEG at bedside.

## 2020-07-06 NOTE — Consult Note (Addendum)
Neurology Consultation  Reason for Consult: Concern for seizures Referring Physician: Dr. Jonah BlueJennifer Yates  RU:[id number]:concern for seizures  History is obtained from:EMR   HPI: Ryan SmartBrandon L Faulkner is a 40 y.o. male past medical history significant for hypertension, depression/anxiety with history of suicidal ideation, alcohol dependence, tobacco dependence presented with excessive movements of extremities with concern for seizures.  Patient overdosed on unknown amount of Seroquel +/-BuSpar, found facedown at hotel room at approx 0700.  EMS gave Narcan w/no change.  Patient blood pressure has been running high since admission with systolic of 102-152 and diastolic of 80 to 120 mmHg.  Patient is tachycardic with pulse rate ranging from 106-127/minute.  He is afebrile and satting well at room air. CT head negative for acute intracranial pathology.  Ethyl alcohol level is high at 96.  AST and ALT mildly elevated at 53 and 64 respectively.  Acetaminophen level <10.  QTc mildly elevated at 486. Pt has alcohol use disorder and was admitted for alcohol withdrawal recently.  LKW: unknown  ROS:  Unable to obtain due to altered mental status.   Past Medical History:  Diagnosis Date  . 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 Hypertension      No family history on file.   Social History:   reports that he has been smoking. He has never used smokeless tobacco. He reports current alcohol use of about 24.0 standard drinks of alcohol per week. He reports that he does not use drugs.  Medications  Current Facility-Administered Medications:  .  0.9 %  sodium chloride infusion, , Intravenous, Continuous, Lorre NickAllen, Anthony, MD, Last Rate: 125 mL/hr at 07/06/20 1342, New Bag at 07/06/20 1342  Current Outpatient Medications:  .  amLODipine (NORVASC) 10 MG tablet, Take 10 mg by mouth daily., Disp: , Rfl:  .  busPIRone (BUSPAR) 10 MG tablet, Take 10 mg by mouth 2 (two) times daily., Disp: , Rfl:  .   divalproex (DEPAKOTE ER) 500 MG 24 hr tablet, Take 500 mg by mouth at bedtime., Disp: , Rfl:  .  EPINEPHrine 0.3 mg/0.3 mL IJ SOAJ injection, Inject 0.3 mg into the muscle as needed for anaphylaxis., Disp: , Rfl:  .  escitalopram (LEXAPRO) 20 MG tablet, Take 1 tablet by mouth daily. , Disp: , Rfl:  .  lisinopril (ZESTRIL) 10 MG tablet, Take 1 tablet by mouth daily., Disp: , Rfl:  .  naproxen (NAPROSYN) 500 MG tablet, Take 1 tablet (500 mg total) by mouth 2 (two) times daily with a meal., Disp: 20 tablet, Rfl: 00 .  QUEtiapine (SEROQUEL) 200 MG tablet, Take 200 mg by mouth at bedtime., Disp: , Rfl:    Exam: Current vital signs: BP (!) 144/94   Pulse (!) 127   Temp (!) 96.7 F (35.9 C) (Axillary) Comment (Src): patient responded to probe under arm.   Resp 18   Ht 5\' 10"  (1.778 m)   Wt 95.3 kg   SpO2 100%   BMI 30.13 kg/m  Vital signs in last 24 hours: Temp:  [96.7 F (35.9 C)] 96.7 F (35.9 C) (01/11 1316) Pulse Rate:  [106-127] 127 (01/11 1615) Resp:  [10-27] 18 (01/11 1615) BP: (102-152)/(80-120) 144/94 (01/11 1615) SpO2:  [93 %-100 %] 100 % (01/11 1615) Weight:  [95.3 kg] 95.3 kg (01/11 1259)  GENERAL: Somnolent, breathing heavily and snoring in between. Arousable by sternal rub and painful stimuli.   Moving all 4 limbs spontaneously.  He is not oriented to  place, person, time.  HEENT: - Normocephalic and atraumatic, mucous membranes dry, face flushed, no LN++, no Thyromegally LUNGS -symmetric chest rise,Normal respiratory effort CV -tachycardia present, no JVD, no peripheral edema ABDOMEN - Soft,  nondistended  Ext: warm, well perfused,  no edema  NEURO:  Mental Status: Somnolent, breathing heavily and snoring in between. Arousable by sternal rub and painful stimuli.  Moving all 4 limbs.   He is not oriented to place, person, time.  Language: On painful stimuli patient started cursing on staff. Not able to assess fully due to AMS.  Cranial Nerves: PERRL, EOM- Not able to  assess  motor: Tone normal, bulk is normal, excessive movements of all 4 extremities and excessive facial movements observed.  Pt is restless.   Reflexes- Increased in LE's.    Sensation- Intact to touch as evidence by grimacing and withdrawal.  Coordination: Not able to assess due to AMS Gait- deferred   Labs  I have reviewed labs in epic and the results pertinent to this consultation are:   CBC    Component Value Date/Time   WBC 9.5 07/06/2020 1307   RBC 4.72 07/06/2020 1307   HGB 15.0 07/06/2020 1438   HGB 17.4 07/01/2014 0842   HCT 44.0 07/06/2020 1438   HCT 52.4 (H) 07/01/2014 0842   PLT 166 07/06/2020 1307   PLT 244 07/01/2014 0842   MCV 93.9 07/06/2020 1307   MCV 96 07/01/2014 0842   MCH 31.4 07/06/2020 1307   MCHC 33.4 07/06/2020 1307   RDW 13.1 07/06/2020 1307   RDW 13.7 07/01/2014 0842   LYMPHSABS 1.8 07/06/2020 1307   LYMPHSABS 1.9 07/01/2014 0842   MONOABS 0.9 07/06/2020 1307   MONOABS 0.9 07/01/2014 0842   EOSABS 0.1 07/06/2020 1307   EOSABS 0.2 07/01/2014 0842   BASOSABS 0.0 07/06/2020 1307   BASOSABS 0.0 07/01/2014 0842    CMP     Component Value Date/Time   NA 135 07/06/2020 1438   NA 138 07/01/2014 0842   K 3.6 07/06/2020 1438   K 4.2 07/01/2014 0842   CL 98 07/06/2020 1307   CL 107 07/01/2014 0842   CO2 23 07/06/2020 1307   CO2 25 07/01/2014 0842   GLUCOSE 144 (H) 07/06/2020 1307   GLUCOSE 114 (H) 07/01/2014 0842   BUN 6 07/06/2020 1307   BUN 12 07/01/2014 0842   CREATININE 0.72 07/06/2020 1307   CREATININE 1.07 07/01/2014 0842   CALCIUM 9.2 07/06/2020 1307   CALCIUM 9.0 07/01/2014 0842   PROT 6.9 07/06/2020 1307   PROT 7.5 07/01/2014 0842   ALBUMIN 3.8 07/06/2020 1307   ALBUMIN 4.2 07/01/2014 0842   AST 70 (H) 07/06/2020 1307   AST 27 07/01/2014 0842   ALT 70 (H) 07/06/2020 1307   ALT 84 (H) 07/01/2014 0842   ALKPHOS 67 07/06/2020 1307   ALKPHOS 92 07/01/2014 0842   BILITOT 1.2 07/06/2020 1307   BILITOT 1.3 (H) 07/01/2014 0842    GFRNONAA >60 07/06/2020 1307   GFRNONAA >60 07/01/2014 0842   GFRNONAA >60 05/03/2012 2215   GFRAA >60 03/09/2020 0236   GFRAA >60 07/01/2014 0842   GFRAA >60 05/03/2012 2215    Lipid Panel  No results found for: CHOL, TRIG, HDL, CHOLHDL, VLDL, LDLCALC, LDLDIRECT   Imaging I have reviewed the images obtained:   CT Head Wo Contrast  Result Date: 07/06/2020 CLINICAL DATA:  40 year old male found unresponsive, cerebral hemorrhage suspected. EXAM: CT HEAD WITHOUT CONTRAST TECHNIQUE: Contiguous axial images were obtained from the  base of the skull through the vertex without intravenous contrast. COMPARISON:  05/03/2012 FINDINGS: Brain: No evidence of acute infarction, hemorrhage, hydrocephalus, extra-axial collection or mass lesion/mass effect. Vascular: No hyperdense vessel or unexpected calcification. Skull: Normal. Negative for fracture or focal lesion. Sinuses/Orbits: No acute finding. Other: None. IMPRESSION: No acute intracranial abnormality. Electronically Signed   By: Marliss Coots MD   On: 07/06/2020 14:30   DG Chest Port 1 View  Result Date: 07/06/2020 CLINICAL DATA:  Cough. EXAM: PORTABLE CHEST 1 VIEW COMPARISON:  08/14/2017. FINDINGS: Mediastinum hilar structures normal. Low lung volumes with bibasilar atelectasis/infiltrates. No pleural effusion or pneumothorax. Stable cardiomegaly. No pulmonary venous congestion. Thoracic spine scoliosis. IMPRESSION: Low lung volumes with bibasilar atelectasis/infiltrates. Electronically Signed   By: Maisie Fus  Register   On: 07/06/2020 13:25   MRI examination of the brain  Assessment: Seroquel +/- BuSpar overdose -Extrapyramidal and Anticholinergic symptoms -Akathisia -? Seizure -?Alcohol withdrawal  Impression:Pt is a 40 year old male with PMH significant for hypertension, depression, anxiety with history of suicidal ideation, alcohol dependence, tobacco dependence presented with excessive movements of extremities with concern for seizures.   Pt overdosed on unknown amount of Seroquel +/-BuSpar s/p Narcan w/no change. BP running high in ED, tachycardic.  He is afebrile and satting well at room air. CT head negative for acute intracranial pathology.  Ethyl alcohol level is high at 96.  QTc mildly elevated at 486. Pt's symptoms most likely related to overdose of Seroquel +/-Buspar leading to EPS / akathisia/anticholinergic side effects. No seizure-like activity seen on examination but he is at high risk for having a seizures due to Seroquel +/-BuSpar overdose so we will keep it as one of the differential. We will order EEG to monitor for seizures. Will not start antiepileptics at this time but will consider it if EEG shows seizures. Less likely Neuroleptic Malignant syndrome as Pt is Afebrile with normal tone and no stiffness. Pt has alcohol use disorder and was admitted for alcohol withdrawal recently, Pt may be withdrawing in addition to overdose. Would recommend monitoring QTc.  Recommendations: -Supportive care. -Avoid antipsychotics. -Continue IV fluids. -Can give Benadryl to help with EPS -EEg -Urine toxicology screen.  -Monitor vitals -Monitor QTc  Dr. Karsten Ro MD PGY1 [facility]    Attending addendum   HPI:  Patient seen and examined at the request of Dr. Ophelia Charter for concern for seizures in the setting of intentional overdose with Seroquel plus minus BuSpar.  Agree with the history and physical documented above. I have obtained some more history as below. I got some more history after the initial encounter and rounding with the neurology team. Dr. Ophelia Charter was able to contact the mother who said that he is attempted suicide in the past.  This time around he reports taking at least 6 g of Seroquel on Sunday.  He also posted on social media that this is his last bolus then he is going to die. Unclear if he took buspirone or not but there was a question so that has been documented in the note. Patient unable to provide any  history Unable to perform review of systems due to patient's mentation Unable to provide any other history from the patient.    Objective  On examination, blood pressure 150/90, heart rate 127, afebrile. General examination: Obtunded with sonorous breathing but arousable to sternal rub. HEENT: Normocephalic atraumatic with dry oral mucous membranes Lungs: Chest clear to auscultation Cardiovascular: Tachycardia but no JVD or peripheral edema Abdomen soft nondistended nontender  Extremities warm well perfused Neurological exam He is appearing obtunded but on noxious stimulation wakes up and starts yelling obscenities. Speech is clear when he is yelling obscenities. He does not follow any commands  Spontaneously sometimes showing some writhing movements-question akathisia. Cranial nerves: Pupils equal round reactive to light, extraocular movements difficult to assess but gaze appears midline, does not blink to threat from either side, face appears symmetric. Motor exam: Moves all 4 extremities spontaneously as well as to noxious stimulation with full strength. Sensory exam: As above Coordination cannot be tested due to his mentation.    His tone did appear to be mildly increased in all 4 extremities but no clonus. His DTRs revealed mildly brisk ankle jerks but normal biceps reflexes and brachioradialis reflexes. Toes are downgoing  I have personally reviewed CT head-no acute intracranial abnormality  Labs-blood gas unremarkable.  BMP unremarkable with the exception of elevated glucose at 144.  Liver function tests show AST and ALT elevated at 70.  Normal GFR.  CBC within normal limits.      Assessment  40 year old with past medical history significant for hypertension depression anxiety depression suicidal ideation and prior suicide attempt, alcohol dependence, tobacco dependence with intentional Seroquel overdose and questionable BuSpar overdose as well. Remains hypotensive and  tachycardic Question if some of the movements noted on trying to examine him were seizures but I think he has more akathisia and some extrapyramidal symptoms such as stiffness. I do not suspect he has neuroleptic malignant syndrome.   Recommendations  - Supportive care per primary team - Follow poison control findings - Can use Benadryl for treatment of extraparametal symptoms - Avoid QTC prolonging drugs. - Spot EEG stat. - If the Spot EEG shows any evidence of seizures, will consider long-term EEG.  -MRI brain without contrast at some point when stable.    Plan discussed with Dr. Ophelia Charter    --  Milon Dikes, MD  Neurologist  [facility]  Pager: [phone]

## 2020-07-06 NOTE — ED Notes (Signed)
Poison control called for update, repeat EKG obtained and noted improved QTC. Recommends maybe a CK level, but kidney function is reassuring.

## 2020-07-07 DIAGNOSIS — E6609 Other obesity due to excess calories: Secondary | ICD-10-CM

## 2020-07-07 DIAGNOSIS — Z6830 Body mass index (BMI) 30.0-30.9, adult: Secondary | ICD-10-CM

## 2020-07-07 DIAGNOSIS — F332 Major depressive disorder, recurrent severe without psychotic features: Secondary | ICD-10-CM

## 2020-07-07 DIAGNOSIS — F1020 Alcohol dependence, uncomplicated: Secondary | ICD-10-CM

## 2020-07-07 DIAGNOSIS — F17200 Nicotine dependence, unspecified, uncomplicated: Secondary | ICD-10-CM

## 2020-07-07 LAB — MAGNESIUM: Magnesium: 1.7 mg/dL (ref 1.7–2.4)

## 2020-07-07 LAB — CBC
HCT: 40.4 % (ref 39.0–52.0)
Hemoglobin: 13.5 g/dL (ref 13.0–17.0)
MCH: 31.5 pg (ref 26.0–34.0)
MCHC: 33.4 g/dL (ref 30.0–36.0)
MCV: 94.4 fL (ref 80.0–100.0)
Platelets: 169 10*3/uL (ref 150–400)
RBC: 4.28 MIL/uL (ref 4.22–5.81)
RDW: 13.2 % (ref 11.5–15.5)
WBC: 9.9 10*3/uL (ref 4.0–10.5)
nRBC: 0 % (ref 0.0–0.2)

## 2020-07-07 LAB — BASIC METABOLIC PANEL
Anion gap: 10 (ref 5–15)
BUN: <5 mg/dL — ABNORMAL LOW (ref 6–20)
CO2: 22 mmol/L (ref 22–32)
Calcium: 8.4 mg/dL — ABNORMAL LOW (ref 8.9–10.3)
Chloride: 106 mmol/L (ref 98–111)
Creatinine, Ser: 0.75 mg/dL (ref 0.61–1.24)
GFR, Estimated: >60 mL/min (ref >60)
Glucose, Bld: 93 mg/dL (ref 70–99)
Potassium: 4 mmol/L (ref 3.5–5.1)
Sodium: 138 mmol/L (ref 135–145)

## 2020-07-07 LAB — TSH: TSH: 1.138 u[IU]/mL (ref 0.350–4.500)

## 2020-07-07 LAB — VALPROIC ACID LEVEL: Valproic Acid Lvl: <10 ug/mL — ABNORMAL LOW (ref 50.0–100.0)

## 2020-07-07 LAB — CK: Total CK: 804 U/L — ABNORMAL HIGH (ref 49–397)

## 2020-07-07 LAB — LACTATE DEHYDROGENASE: LDH: 185 U/L (ref 98–192)

## 2020-07-07 LAB — HIV ANTIBODY (ROUTINE TESTING W REFLEX): HIV Screen 4th Generation wRfx: NONREACTIVE

## 2020-07-07 MED ORDER — DIPHENHYDRAMINE HCL 50 MG/ML IJ SOLN
25.0000 mg | Freq: Four times a day (QID) | INTRAMUSCULAR | Status: DC | PRN
  Administered 2020-07-07: 25 mg via INTRAVENOUS
  Filled 2020-07-07: qty 1

## 2020-07-07 MED ORDER — THIAMINE HCL 100 MG/ML IJ SOLN
500.0000 mg | Freq: Every day | INTRAVENOUS | Status: AC
  Administered 2020-07-07 – 2020-07-09 (×3): 500 mg via INTRAVENOUS
  Filled 2020-07-07 (×3): qty 5

## 2020-07-07 MED ORDER — DIPHENHYDRAMINE HCL 50 MG/ML IJ SOLN
25.0000 mg | Freq: Once | INTRAMUSCULAR | Status: AC
  Administered 2020-07-07: 25 mg via INTRAVENOUS
  Filled 2020-07-07: qty 1

## 2020-07-07 MED ORDER — DEXTROSE IN LACTATED RINGERS 5 % IV SOLN: INTRAVENOUS | Status: DC

## 2020-07-07 NOTE — ED Notes (Signed)
Paged Dr Loney Loh to [facility]

## 2020-07-07 NOTE — Progress Notes (Signed)
CSW received request to IVC patient. CSW faxed IVC to magistrate and confirmed receipt. CSW contacted [employer] to serve IVC. Paperwork on the front of patient's hard chart.   Gwendlyon Zumbro LCSW

## 2020-07-07 NOTE — Consult Note (Incomplete Revision)
   Ryan Faulkner is a 39 y.o. male past medical history significant for hypertension, depression/anxiety with history of suicidal ideation, alcohol dependence, tobacco dependence presented with excessive movements of extremities with concern for seizures.  Patient overdosed on unknown amount of Seroquel +/-BuSpar, found facedown at hotel room at approx 0700.  EMS gave Narcan w/no change.  Patient blood pressure has been running high since admission with systolic of 102-152 and diastolic of 80 to 120 mmHg.  Patient is tachycardic with pulse rate ranging from 106-127/minute.  He is afebrile and satting well at room air. CT head negative for acute intracranial pathology.  Ethyl alcohol level is high at 96.  AST and ALT mildly elevated at 53 and 64 respectively.  Acetaminophen level <10.  QTc mildly elevated at 486. Pt has alcohol use disorder and was admitted for alcohol withdrawal recently.  Psych consult placed for suicide attempt. As noted above patient presented yesterday after suicide attempt. He allegedly contacted his grandmother prior to the attempt and told her goodbye.  Patient has a psychiatric history of depression and bipolar, severe alcohol use disorder. His history is notable for DT, most recently as 2020 when he was admitted to [facility] for treatment of DTs. Patient has recent ER visit in 01/2020 where he endorsed suicidal ideations and depression. He has an extensive history of alcohol use for over 20 years drinking about 12-15 beers a day. [facility] There are no known records of suicide attempt at this time.   Patient continues to be somnolent and difficult to arouse. He is unable to participate in psychiatric evaluation at this time. Will attempt to reassess patient tomorrow. Patient at high risk for DT's at this time, recommend continue CIWA protocol at this time.   - Due to his overdose on antipsychotic and Buspar.  will avoid any additional  antipsychotics at this time. Patient may require other sedatives and chemical restraints for agitation and aggression.   -Patient with high risk for suicide completion will benefit from inpatient admission for crisis stabilization and medication management.  Patient continues to meet criteria for IVC as he is a risk to self ( suicide attempt).   -Continue safety sitter.   -Recommend inpatient admission. Work closely with SW to get patient placed at inpatient psych facility once he is medically stable.   - 

## 2020-07-07 NOTE — Progress Notes (Addendum)
Neurology Progress Note   S:// Patient seen and examined today.   Pt Obtunded, Doesn't answer questions or follow commands.   O:// Current vital signs: BP (!) 144/113   Pulse (!) 139   Temp 99.2 F (37.3 C) (Axillary)   Resp 17   Ht 5\' 10"  (1.778 m)   Wt 95.3 kg   SpO2 96%   BMI 30.13 kg/m  Vital signs in last 24 hours: Temp:  [96.7 F (35.9 C)-99.2 F (37.3 C)] 99.2 F (37.3 C) (01/12 0332) Pulse Rate:  [98-139] 139 (01/12 0700) Resp:  [10-27] 17 (01/12 0700) BP: (102-164)/(80-126) 144/113 (01/12 0700) SpO2:  [90 %-100 %] 96 % (01/12 0700) Weight:  [95.3 kg] 95.3 kg (01/11 1259) GENERAL: Obtunded with labored breathing but arousable to noxious stimuli.  Opens eyes in between and snoring sometimes.  HEENT: - Normocephalic and atraumatic, dry mucous membranes. LUNGS -patient snoring and making upper airway sounds. CV -tachycardia but no JVD or peripheral edema ABDOMEN - Soft, nontender, nondistended  Ext: warm, well perfused,no peripheral edema NEURO:  Mental Status: He appears obtunded but opens eyes on noxious stimulus. Language: Patient speaking something in sleep, speech was slurred and unintelligable.  He does not follow any commands. Cranial Nerves: PERRL, EOM difficult to assess but appears midline when he opens eyes. no facial asymmetry Motor: Moves all 4 extremities spontaneously as well as to noxious stimulus with full strength.  Reflexes-brisk in LE's more than yesterday and normal in biceps, brachioradialis. Excessive movements of all 4 extremities present.  Tone: Mildly increased, bulk normal, no clonus Sensation-appears intact, moves extremities to noxious stimulus. Toes-downgoing Coordination: Not able to assess due to poor mentation. Gait- deferred   Medications  Current Facility-Administered Medications:  .  0.9 %  sodium chloride infusion, , Intravenous, Continuous, 12-06-1988, MD, Last Rate: 125 mL/hr at 07/07/20 0628, New Bag at 07/07/20  09/04/20 .  acetaminophen (TYLENOL) tablet 650 mg, 650 mg, Oral, Q6H PRN **OR** acetaminophen (TYLENOL) suppository 650 mg, 650 mg, Rectal, Q6H PRN, 1791, MD .  albuterol (PROVENTIL) (2.5 MG/3ML) 0.083% nebulizer solution 2.5 mg, 2.5 mg, Nebulization, Q2H PRN, Jonah Blue, MD .  Ampicillin-Sulbactam (UNASYN) 3 g in sodium chloride 0.9 % 100 mL IVPB, 3 g, Intravenous, Q6H, Jonah Blue, RPH, Stopped at 07/07/20 0726 .  bisacodyl (DULCOLAX) EC tablet 5 mg, 5 mg, Oral, Daily PRN, 09/04/20, MD .  enoxaparin (LOVENOX) injection 40 mg, 40 mg, Subcutaneous, Q24H, Jonah Blue, MD, 40 mg at 07/06/20 1938 .  folic acid injection 1 mg, 1 mg, Intravenous, Daily, 09/03/20, MD, 1 mg at 07/06/20 1937 .  hydrALAZINE (APRESOLINE) injection 5 mg, 5 mg, Intravenous, Q4H PRN, 09/03/20, MD .  LORazepam (ATIVAN) injection 0-4 mg, 0-4 mg, Intravenous, Q4H, 2 mg at 07/07/20 0506 **FOLLOWED BY** [START ON 07/08/2020] LORazepam (ATIVAN) injection 0-4 mg, 0-4 mg, Intravenous, Q8H, 07/10/2020, MD .  LORazepam (ATIVAN) tablet 1-4 mg, 1-4 mg, Oral, Q1H PRN **OR** LORazepam (ATIVAN) injection 1-4 mg, 1-4 mg, Intravenous, Q1H PRN, Jonah Blue, MD, 2 mg at 07/07/20 0651 .  morphine 2 MG/ML injection 2 mg, 2 mg, Intravenous, Q2H PRN, 09/04/20, MD, 2 mg at 07/07/20 0348 .  multivitamin with minerals tablet 1 tablet, 1 tablet, Oral, Daily, 09/04/20, MD .  nicotine (NICODERM CQ - dosed in mg/24 hours) patch 14 mg, 14 mg, Transdermal, q1800, Jonah Blue, MD .  ondansetron [facility]) tablet 4 mg, 4 mg, Oral, Q6H PRN **OR** ondansetron (ZOFRAN) injection 4  mg, 4 mg, Intravenous, Q6H PRN, Jonah Blue, MD .  polyethylene glycol (MIRALAX / GLYCOLAX) packet 17 g, 17 g, Oral, Daily PRN, Jonah Blue, MD .  sodium chloride flush (NS) 0.9 % injection 3 mL, 3 mL, Intravenous, Q12H, Jonah Blue, MD, 3 mL at 07/06/20 2124 .  [DISCONTINUED] thiamine tablet 100 mg, 100 mg, Oral,  Daily **OR** thiamine (B-1) injection 500 mg, 500 mg, Intravenous, Daily, Jonah Blue, MD  Current Outpatient Medications:  .  amLODipine (NORVASC) 10 MG tablet, Take 10 mg by mouth daily., Disp: , Rfl:  .  busPIRone (BUSPAR) 10 MG tablet, Take 10 mg by mouth 2 (two) times daily., Disp: , Rfl:  .  divalproex (DEPAKOTE ER) 500 MG 24 hr tablet, Take 500 mg by mouth at bedtime., Disp: , Rfl:  .  EPINEPHrine 0.3 mg/0.3 mL IJ SOAJ injection, Inject 0.3 mg into the muscle as needed for anaphylaxis., Disp: , Rfl:  .  escitalopram (LEXAPRO) 20 MG tablet, Take 1 tablet by mouth daily. , Disp: , Rfl:  .  lisinopril (ZESTRIL) 10 MG tablet, Take 1 tablet by mouth daily., Disp: , Rfl:  .  naproxen (NAPROSYN) 500 MG tablet, Take 1 tablet (500 mg total) by mouth 2 (two) times daily with a meal., Disp: 20 tablet, Rfl: 00 .  QUEtiapine (SEROQUEL) 200 MG tablet, Take 200 mg by mouth at bedtime., Disp: , Rfl:  Labs CBC    Component Value Date/Time   WBC 9.9 07/07/2020 0500   RBC 4.28 07/07/2020 0500   HGB 13.5 07/07/2020 0500   HGB 17.4 07/01/2014 0842   HCT 40.4 07/07/2020 0500   HCT 52.4 (H) 07/01/2014 0842   PLT 169 07/07/2020 0500   PLT 244 07/01/2014 0842   MCV 94.4 07/07/2020 0500   MCV 96 07/01/2014 0842   MCH 31.5 07/07/2020 0500   MCHC 33.4 07/07/2020 0500   RDW 13.2 07/07/2020 0500   RDW 13.7 07/01/2014 0842   LYMPHSABS 1.8 07/06/2020 1307   LYMPHSABS 1.9 07/01/2014 0842   MONOABS 0.9 07/06/2020 1307   MONOABS 0.9 07/01/2014 0842   EOSABS 0.1 07/06/2020 1307   EOSABS 0.2 07/01/2014 0842   BASOSABS 0.0 07/06/2020 1307   BASOSABS 0.0 07/01/2014 0842    CMP     Component Value Date/Time   NA 138 07/07/2020 0500   NA 138 07/01/2014 0842   K 4.0 07/07/2020 0500   K 4.2 07/01/2014 0842   CL 106 07/07/2020 0500   CL 107 07/01/2014 0842   CO2 22 07/07/2020 0500   CO2 25 07/01/2014 0842   GLUCOSE 93 07/07/2020 0500   GLUCOSE 114 (H) 07/01/2014 0842   BUN <5 (L) 07/07/2020 0500    BUN 12 07/01/2014 0842   CREATININE 0.75 07/07/2020 0500   CREATININE 1.07 07/01/2014 0842   CALCIUM 8.4 (L) 07/07/2020 0500   CALCIUM 9.0 07/01/2014 0842   PROT 6.9 07/06/2020 1307   PROT 7.5 07/01/2014 0842   ALBUMIN 3.8 07/06/2020 1307   ALBUMIN 4.2 07/01/2014 0842   AST 70 (H) 07/06/2020 1307   AST 27 07/01/2014 0842   ALT 70 (H) 07/06/2020 1307   ALT 84 (H) 07/01/2014 0842   ALKPHOS 67 07/06/2020 1307   ALKPHOS 92 07/01/2014 0842   BILITOT 1.2 07/06/2020 1307   BILITOT 1.3 (H) 07/01/2014 0842   GFRNONAA >60 07/07/2020 0500   GFRNONAA >60 07/01/2014 0842   GFRNONAA >60 05/03/2012 2215   GFRAA >60 03/09/2020 0236   GFRAA >60 07/01/2014 7408  GFRAA >60 05/03/2012 2215      Lipid Panel  No results found for: CHOL, TRIG, HDL, CHOLHDL, VLDL, LDLCALC, LDLDIRECT   Imaging I have reviewed images in epic and the results pertinent to this consultation are:  CT Head Wo Contrast  Result Date: 07/06/2020 CLINICAL DATA:  40 year old male found unresponsive, cerebral hemorrhage suspected. EXAM: CT HEAD WITHOUT CONTRAST TECHNIQUE: Contiguous axial images were obtained from the base of the skull through the vertex without intravenous contrast. COMPARISON:  05/03/2012 FINDINGS: Brain: No evidence of acute infarction, hemorrhage, hydrocephalus, extra-axial collection or mass lesion/mass effect. Vascular: No hyperdense vessel or unexpected calcification. Skull: Normal. Negative for fracture or focal lesion. Sinuses/Orbits: No acute finding. Other: None. IMPRESSION: No acute intracranial abnormality. Electronically Signed   By: Marliss Cootsylan  Suttle MD   On: 07/06/2020 14:30   DG Chest Port 1 View  Result Date: 07/06/2020 CLINICAL DATA:  Cough. EXAM: PORTABLE CHEST 1 VIEW COMPARISON:  08/14/2017. FINDINGS: Mediastinum hilar structures normal. Low lung volumes with bibasilar atelectasis/infiltrates. No pleural effusion or pneumothorax. Stable cardiomegaly. No pulmonary venous congestion. Thoracic  spine scoliosis. IMPRESSION: Low lung volumes with bibasilar atelectasis/infiltrates. Electronically Signed   By: Maisie Fushomas  Register   On: 07/06/2020 13:25   EEG adult  Result Date: 07/07/2020 Charlsie QuestYadav, Priyanka O, MD     07/07/2020  8:34 AM Patient Name: Ryan Faulkner MRN: [id number] Epilepsy Attending: Charlsie QuestPriyanka O Yadav Referring Physician/Provider: Dr. Jonah BlueJennifer Yates Date: 07/06/2020 Duration: 23.59 mins Patient history: 40 year old male with concern for seizures in the setting of intentional overdose with Seroquel. EEG to evaluate for seizures. Level of alertness: Awake, drowsy, sleep, comatose, lethargic AEDs during EEG study: Lorazepam Technical aspects: This EEG study was done with scalp electrodes positioned according to the 10-20 International system of electrode placement. Electrical activity was acquired at a sampling rate of 500Hz  and reviewed with a high frequency filter of 70Hz  and a low frequency filter of 1Hz . EEG data were recorded continuously and digitally stored. Description: The posterior dominant rhythm consists of 9 Hz activity of moderate voltage (25-35 uV) seen predominantly in posterior head regions, symmetric and reactive to eye opening and eye closing. At around 1800, patient was noted to have an episode where patient's left arm was extended and abducted while he was laying in the bed and suddenly had a jerking movement with elevation of left arm as well as brief generalized whole body twitching. He then continued to have nonrhythmic whole-body movements with eyes closed. Concomitant EEG before, during and after the event did not show any EEG to suggest seizure. Hyperventilation and photic stimulation were not performed.   IMPRESSION: This study is within normal limits. No seizures or epileptiform discharges were seen throughout the recording. At around 1800, patient was noted to have an episode of jerking as described above without concomitant EEG change and was not epileptic. Charlsie QuestPriyanka O  Yadav    Assessment: Pt is a 40 year old male with PMH significant for hypertension, depression, anxiety with history of suicidal ideation, alcohol dependence, tobacco dependence presented with excessive movements of extremities with concern for seizures.  Pt overdosed on unknown amount of Seroquel +/-BuSpar. He has been Afebrile. Blood pressure has been running high with systolic of 102-164 and diastolic of 80 to 126 mmHg.  Patient is tachycardic with PR of 98-139/minute.  He is satting well at room air.  EEG normal with no seizure or epileptiform discharges seen on recording.  Patient was noted to have an episode of jerking without concomitant  EEG change and was not epileptic.  CK high at 804, WBCs 9.9, urine tox screen negative.patient symptoms are most likely due to akathisia, EPS such as stiffness.  Do not suspect it as NMS. Patient was started on Benadryl to help with EPS.  Patient was also started on Unasyn for possible aspiration.    Impression: -Seroquel +/- BuSpar overdose -Extrapyramidal and Anticholinergic symptoms -?Akathisia -?Alcohol withdrawal  Recommendations: - Supportive care per primary team - Follow poison control recs. - Can increase Benadryl to 25 mg Q6H for treatment of extraparametal symptoms --Avoid Antiposychotics - Avoid QTC prolonging drugs. -MRI brain without contrast at some point when stable  Dr. Elige Ko MD PGY1 [facility] Neurology    Attending Neurohospitalist Addendum Patient seen and examined with APP/Resident. Agree with the history and physical as documented above. Agree with the plan as documented, which I helped formulate. I have independently reviewed the chart, obtained history, review of systems and examined the patient.I have personally reviewed pertinent head/neck/spine imaging (CT/MRI). Please feel free to call with any questions. --- Milon Dikes, MD [facility] Pager: [phone]  If 7pm to 7am, please call on call  as listed on AMION.

## 2020-07-07 NOTE — Progress Notes (Signed)
Called and updated mother Steward Drone about patient transfer to floor and restraint use

## 2020-07-07 NOTE — ED Notes (Signed)
Provider paged.

## 2020-07-07 NOTE — Progress Notes (Signed)
PROGRESS NOTE    Ryan Faulkner  GGY:[id number] DOB: Apr 22, 1981 DOA: 07/06/2020 PCP: Patient, No Pcp Per    Brief Narrative:  Ryan Faulkner was admitted to the hospital with a working diagnosis of intentional overdose with quetiapine, complicated with extrapyramidal and anticholinergic syndrome.  40 year old male with past medical history for hypertension, depression, anxiety, alcohol abuse and tobacco dependence who presents after an intentional overdose.  He attempted suicide via overdose on 01/09, he called his mother to say goodbye.  The following day he was delusional and disoriented. 01/11 he was found down at the hotel room around 7 in the morning, apparently overdosed with unknown amount of quetiapine +/-buspiron he he received naloxone by EMS with no significant change.  He was brought to the hospital for further evaluation. On his initial physical examination blood pressure 123/112, heart rate 123, respirate 16, oxygen saturation 99%, lungs clear to auscultation bilaterally, heart S1-S2, present, tachycardic, abdomen soft, no lower extremity edema.  Chest radiograph with bibasilar atelectasis, no frank infiltrates.   EKG 120 bpm, normal axis, normal intervals, sinus rhythm, no st segment or T wave changes.   Assessment & Plan:   Principal Problem:   Intentional overdose of drug in tablet form (HCC) Active Problems:   Major depressive disorder, recurrent severe without psychotic features (HCC)   Alcohol use disorder, severe, dependence (HCC)   Essential hypertension   Tobacco dependence   Class 1 obesity due to excess calories with body mass index (BMI) of 30.0 to 30.9 in adult   1. Suicidal attempt, quetiapine and buspirone overdose, complicated with extrapyramidal syndrome and anticholinergic syndrome.  Patient continue to be agitated, dry mouth, confused and disorientated. Continue with supportive medical care. Neuro checks, fall and aspiration precautions.  IV fluids with  dextrose, continue with multivitamins including thiamine (high dose).  EEG no active seizures.   Will consult psychiatry for evaluation and will place patient on one to one observation.  He needs involuntary commitment for now due to suicidal risk.   2. HTN. Continue blood pressure control.   3. Tobacco and alcohol dependence. Continue neuro checks q 4 h, as needed lorazepam per CIWA protocol.  Elevation of AST and ALT 70-70, close follow up.   4. Hypomagnesemia. Mg is 1,7, will correct with mag sulfate. K is 4.0 and renal function stable with serum cr at 0,75.   5. Obesity type 1. Calculated BMI is 30,1. High risk for inpatient complications.   Patient continue to be at high risk for worsening encephalopathy   Status is: Inpatient  Remains inpatient appropriate because:IV treatments appropriate due to intensity of illness or inability to take PO   Dispo: The patient is from: Home              Anticipated d/c is to: Home              Anticipated d/c date is: 3 days              Patient currently is not medically stable to d/c.   DVT prophylaxis: Enoxaparin   Code Status:   full  Family Communication:  No family at the bedside    Consultants:   Psych   Neurology     Subjective: Patient is confused and disorientated, agitated and aggressive, no apparent pain.   Objective: Vitals:   07/07/20 1315 07/07/20 1330 07/07/20 1345 07/07/20 1400  BP:    (!) 157/104  Pulse: (!) 47 68 (!) 107 (!) 106  Resp: Marland Kitchen)  23 (!) 23 (!) 23 (!) 22  Temp:      TempSrc:      SpO2: (!) 46% (!) 46% 99% 99%  Weight:      Height:       No intake or output data in the 24 hours ending 07/07/20 1500 Filed Weights   07/06/20 1259  Weight: 95.3 kg    Examination:   General: Not in pain or dyspnea, deconditioned  Neurology: Awake, confused and disorientated, agitated. Positive dyskinesia. No nystagmus  E ENT: positive pallor, no icterus, oral mucosa very dry  Cardiovascular: No JVD. S1-S2  present, rhythmic, no gallops, rubs, or murmurs. No lower extremity edema. Pulmonary: positive breath sounds bilaterally, Gastrointestinal. Abdomen soft and non tender Skin. No rashes Musculoskeletal: no joint deformities     Data Reviewed: I have personally reviewed following labs and imaging studies  CBC: Recent Labs  Lab 07/06/20 1307 07/06/20 1438 07/07/20 0500  WBC 9.5  --  9.9  NEUTROABS 6.6  --   --   HGB 14.8 15.0 13.5  HCT 44.3 44.0 40.4  MCV 93.9  --  94.4  PLT 166  --  169   Basic Metabolic Panel: Recent Labs  Lab 07/06/20 1307 07/06/20 1438 07/07/20 0500 07/07/20 0533  NA 135 135 138  --   K 3.8 3.6 4.0  --   CL 98  --  106  --   CO2 23  --  22  --   GLUCOSE 144*  --  93  --   BUN 6  --  <5*  --   CREATININE 0.72  --  0.75  --   CALCIUM 9.2  --  8.4*  --   MG  --   --   --  1.7   GFR: Estimated Creatinine Clearance: 143.6 mL/min (by C-G formula based on SCr of 0.75 mg/dL). Liver Function Tests: Recent Labs  Lab 07/06/20 1307  AST 70*  ALT 70*  ALKPHOS 67  BILITOT 1.2  PROT 6.9  ALBUMIN 3.8   No results for input(s): LIPASE, AMYLASE in the last 168 hours. No results for input(s): AMMONIA in the last 168 hours. Coagulation Profile: No results for input(s): INR, PROTIME in the last 168 hours. Cardiac Enzymes: Recent Labs  Lab 07/07/20 0533  CKTOTAL 804*   BNP (last 3 results) No results for input(s): PROBNP in the last 8760 hours. HbA1C: No results for input(s): HGBA1C in the last 72 hours. CBG: Recent Labs  Lab 07/06/20 1304  GLUCAP 138*   Lipid Profile: No results for input(s): CHOL, HDL, LDLCALC, TRIG, CHOLHDL, LDLDIRECT in the last 72 hours. Thyroid Function Tests: Recent Labs    07/07/20 0533  TSH 1.138   Anemia Panel: No results for input(s): VITAMINB12, FOLATE, FERRITIN, TIBC, IRON, RETICCTPCT in the last 72 hours.    Radiology Studies: I have reviewed all of the imaging during this hospital visit  personally     Scheduled Meds: . enoxaparin (LOVENOX) injection  40 mg Subcutaneous Q24H  . folic acid  1 mg Intravenous Daily  . LORazepam  0-4 mg Intravenous Q4H   Followed by  . [START ON 07/08/2020] LORazepam  0-4 mg Intravenous Q8H  . multivitamin with minerals  1 tablet Oral Daily  . nicotine  14 mg Transdermal q1800  . sodium chloride flush  3 mL Intravenous Q12H   Continuous Infusions: . sodium chloride 125 mL/hr at 07/07/20 0628  . ampicillin-sulbactam (UNASYN) IV Stopped (07/07/20 1234)  . thiamine  injection Stopped (07/07/20 1417)     LOS: 1 day        Ivanna Kocak Annett Gula, MD

## 2020-07-07 NOTE — ED Notes (Signed)
Spoke with provider regarding pt continuing to have jerking and thrashing episodes despite ativan. Pt states she will take a look at pts chart and return a phone call.

## 2020-07-07 NOTE — Procedures (Addendum)
Patient Name: Ryan Faulkner  MRN: [id number]  Epilepsy Attending: Charlsie Quest  Referring Physician/Provider: Dr. Jonah Blue Date: 07/06/2020  Duration: 23.59 mins  Patient history: 40 year old male with concern for seizures in the setting of intentional overdose with Seroquel. EEG to evaluate for seizures.  Level of alertness: Awake  AEDs during EEG study: Lorazepam  Technical aspects: This EEG study was done with scalp electrodes positioned according to the 10-20 International system of electrode placement. Electrical activity was acquired at a sampling rate of 500Hz  and reviewed with a high frequency filter of 70Hz  and a low frequency filter of 1Hz . EEG data were recorded continuously and digitally stored.   Description: The posterior dominant rhythm consists of 9 Hz activity of moderate voltage (25-35 uV) seen predominantly in posterior head regions, symmetric and reactive to eye opening and eye closing.  At around 1800, patient was noted to have an episode where patient's left arm was extended and abducted while he was laying in the bed and suddenly had a jerking movement with elevation of left arm as well as brief generalized whole body twitching. He then continued to have nonrhythmic whole-body movements with eyes closed. Concomitant EEG before, during and after the event did not show any EEG to suggest seizure.  Hyperventilation and photic stimulation were not performed.     IMPRESSION: This study is within normal limits. No seizures or epileptiform discharges were seen throughout the recording.  At around 1800, patient was noted to have an episode of jerking as described above without concomitant EEG change and was not epileptic.   Antoino Westhoff 

## 2020-07-07 NOTE — Plan of Care (Signed)
  Problem: Safety: Goal: Non-violent Restraint(s) Outcome: Progressing   Problem: Clinical Measurements: Goal: Will remain free from infection Outcome: Progressing Goal: Diagnostic test results will improve Outcome: Progressing Goal: Respiratory complications will improve Outcome: Progressing Goal: Cardiovascular complication will be avoided Outcome: Progressing   Problem: Activity: Goal: Risk for activity intolerance will decrease Outcome: Progressing   Problem: Elimination: Goal: Will not experience complications related to urinary retention Outcome: Progressing   Problem: Skin Integrity: Goal: Risk for impaired skin integrity will decrease Outcome: Progressing

## 2020-07-07 NOTE — Consult Note (Addendum)
   Ryan WILLHITE is a 40 y.o. male past medical history significant for hypertension, depression/anxiety with history of suicidal ideation, alcohol dependence, tobacco dependence presented with excessive movements of extremities with concern for seizures.  Patient overdosed on unknown amount of Seroquel +/-BuSpar, found facedown at hotel room at approx 0700.  EMS gave Narcan w/no change.  Patient blood pressure has been running high since admission with systolic of 102-152 and diastolic of 80 to 120 mmHg.  Patient is tachycardic with pulse rate ranging from 106-127/minute.  He is afebrile and satting well at room air. CT head negative for acute intracranial pathology.  Ethyl alcohol level is high at 96.  AST and ALT mildly elevated at 53 and 64 respectively.  Acetaminophen level <10.  QTc mildly elevated at 486. Pt has alcohol use disorder and was admitted for alcohol withdrawal recently.  Psych consult placed for suicide attempt. As noted above patient presented yesterday after suicide attempt. He allegedly contacted his grandmother prior to the attempt and told her goodbye.  Patient has a psychiatric history of depression and bipolar, severe alcohol use disorder. His history is notable for DT, most recently as 2020 when he was admitted to [facility] for treatment of DTs. Patient has recent ER visit in 01/2020 where he endorsed suicidal ideations and depression. He has an extensive history of alcohol use for over 20 years drinking about 12-15 beers a day. He is currently receiving services at [facility] outpatient. There are no known records of suicide attempt at this time.   Patient continues to be somnolent and difficult to arouse. He is unable to participate in psychiatric evaluation at this time. Will attempt to reassess patient tomorrow. Patient at high risk for DT's at this time, recommend continue CIWA protocol at this time.   - Due to his overdose on antipsychotic and Buspar.  will avoid any additional  antipsychotics at this time. Patient may require other sedatives and chemical restraints for agitation and aggression.   -Patient with high risk for suicide completion will benefit from inpatient admission for crisis stabilization and medication management.  Patient continues to meet criteria for IVC as he is a risk to self ( suicide attempt).   -Patent attorney.   -Recommend inpatient admission. Work closely with SW to get patient placed at inpatient psych facility once he is medically stable.   -

## 2020-07-08 DIAGNOSIS — I10 Essential (primary) hypertension: Secondary | ICD-10-CM

## 2020-07-08 LAB — CALCIUM, IONIZED: Calcium, Ionized, Serum: 4.9 mg/dL (ref 4.5–5.6)

## 2020-07-08 LAB — COMPREHENSIVE METABOLIC PANEL
ALT: 69 U/L — ABNORMAL HIGH (ref 0–44)
AST: 65 U/L — ABNORMAL HIGH (ref 15–41)
Albumin: 3.3 g/dL — ABNORMAL LOW (ref 3.5–5.0)
Alkaline Phosphatase: 64 U/L (ref 38–126)
Anion gap: 12 (ref 5–15)
BUN: <5 mg/dL — ABNORMAL LOW (ref 6–20)
CO2: 24 mmol/L (ref 22–32)
Calcium: 8.9 mg/dL (ref 8.9–10.3)
Chloride: 101 mmol/L (ref 98–111)
Creatinine, Ser: 0.63 mg/dL (ref 0.61–1.24)
GFR, Estimated: >60 mL/min (ref >60)
Glucose, Bld: 96 mg/dL (ref 70–99)
Potassium: 3.3 mmol/L — ABNORMAL LOW (ref 3.5–5.1)
Sodium: 137 mmol/L (ref 135–145)
Total Bilirubin: 1.4 mg/dL — ABNORMAL HIGH (ref 0.3–1.2)
Total Protein: 6.1 g/dL — ABNORMAL LOW (ref 6.5–8.1)

## 2020-07-08 MED ORDER — POTASSIUM CHLORIDE 10 MEQ/100ML IV SOLN
10.0000 meq | INTRAVENOUS | Status: AC
  Administered 2020-07-08 (×4): 10 meq via INTRAVENOUS
  Filled 2020-07-08 (×3): qty 100

## 2020-07-08 MED ORDER — AMLODIPINE BESYLATE 10 MG PO TABS
10.0000 mg | ORAL_TABLET | Freq: Every day | ORAL | Status: DC
Start: 2020-07-08 — End: 2020-07-14
  Administered 2020-07-08 – 2020-07-14 (×7): 10 mg via ORAL
  Filled 2020-07-08 (×8): qty 1

## 2020-07-08 MED ORDER — LISINOPRIL 10 MG PO TABS
10.0000 mg | ORAL_TABLET | Freq: Every day | ORAL | Status: DC
  Administered 2020-07-08 – 2020-07-14 (×7): 10 mg via ORAL
  Filled 2020-07-08 (×7): qty 1

## 2020-07-08 MED ORDER — NAPROXEN 250 MG PO TABS
500.0000 mg | ORAL_TABLET | Freq: Two times a day (BID) | ORAL | Status: DC
  Administered 2020-07-08 – 2020-07-14 (×12): 500 mg via ORAL
  Filled 2020-07-08 (×12): qty 2

## 2020-07-08 NOTE — Plan of Care (Signed)
  Problem: Safety: Goal: Non-violent Restraint(s) Outcome: Progressing   Problem: Education: Goal: Knowledge of General Education information will improve Description: Including pain rating scale, medication(s)/side effects and non-pharmacologic comfort measures Outcome: Progressing   Problem: Health Behavior/Discharge Planning: Goal: Ability to manage health-related needs will improve Outcome: Progressing   Problem: Clinical Measurements: Goal: Ability to maintain clinical measurements within normal limits will improve Outcome: Progressing Goal: Will remain free from infection Outcome: Progressing Goal: Diagnostic test results will improve Outcome: Progressing Goal: Respiratory complications will improve Outcome: Progressing Goal: Cardiovascular complication will be avoided Outcome: Progressing   Problem: Activity: Goal: Risk for activity intolerance will decrease Outcome: Progressing   Problem: Nutrition: Goal: Adequate nutrition will be maintained Outcome: Progressing   Problem: Coping: Goal: Level of anxiety will decrease Outcome: Progressing   Problem: Elimination: Goal: Will not experience complications related to bowel motility Outcome: Progressing Goal: Will not experience complications related to urinary retention Outcome: Progressing   Problem: Pain Managment: Goal: General experience of comfort will improve Outcome: Progressing   Problem: Safety: Goal: Ability to remain free from injury will improve Outcome: Progressing   Problem: Skin Integrity: Goal: Risk for impaired skin integrity will decrease Outcome: Progressing   Problem: Education: Goal: Expressions of having a comfortable level of knowledge regarding the disease process will increase Outcome: Progressing   Problem: Coping: Goal: Ability to adjust to condition or change in health will improve Outcome: Progressing Goal: Ability to identify appropriate support needs will  improve Outcome: Progressing   Problem: Health Behavior/Discharge Planning: Goal: Compliance with prescribed medication regimen will improve Outcome: Progressing   Problem: Medication: Goal: Risk for medication side effects will decrease Outcome: Progressing   Problem: Clinical Measurements: Goal: Complications related to the disease process, condition or treatment will be avoided or minimized Outcome: Progressing Goal: Diagnostic test results will improve Outcome: Progressing   Problem: Safety: Goal: Verbalization of understanding the information provided will improve Outcome: Progressing   Problem: Self-Concept: Goal: Level of anxiety will decrease Outcome: Progressing Goal: Ability to verbalize feelings about condition will improve Outcome: Progressing   

## 2020-07-08 NOTE — Progress Notes (Addendum)
Neurology Progress Note   S:// Pt is seen and examined today. Pt is alert and oriented to self, age, knows year but doesn't know month. Thinks its february. Able to follow commands.  Attending addendum: To me, he was able to tell me in a very dysarthric speech that he took multiple 25 mg alcohol tablets.  He said he also took ibuprofen and Naprosyn.  O:// Current vital signs: BP (!) 148/105 (BP Location: Left Arm)   Pulse 100   Temp 98.1 F (36.7 C) (Oral)   Resp 20   Ht 5\' 10"  (1.778 m)   Wt 95.3 kg   SpO2 98%   BMI 30.13 kg/m  Vital signs in last 24 hours: Temp:  [98 F (36.7 C)-99.1 F (37.3 C)] 98.1 F (36.7 C) (01/13 1151) Pulse Rate:  [28-153] 100 (01/13 1151) Resp:  [18-26] 20 (01/13 1151) BP: (103-171)/(74-118) 148/105 (01/13 1151) SpO2:  [46 %-100 %] 98 % (01/13 1151) GENERAL: Awake, alert in NAD HEENT: - Normocephalic and atraumatic, dry mucous membranes. LUNGS - symmetrical chest rise, No labored breathing noted. CV - No JVD, No peripheral edema. ABDOMEN - Soft,  nondistended  Ext: warm, well perfused, no edema NEURO:  Mental Status: Sleeping, opens eyes to voice, is able to answer simple questions. Oriented to self and place. Speech is dysarthric-mostly due to dry mouth Poor attention concentration Cranial Nerves: PERRL,  EOMI, visual fields full, no facial asymmetry, facial sensation intact, hearing intact, tongue/uvula/soft palate midline, normal sternocleidomastoid and trapezius muscle strength. No evidence of tongue atrophy or fibrillations. Fissured Tongue  Motor: B/L Hand strength 5/5. Not able to assess Arms and LE's strength as Pt has restrains in all four extremities.  Tone: is normal and bulk is normal Sensation- Intact to light touch bilaterally Coordination: Not able to perform because of Restrains. DTRs: Brisk in the knees. Gait- deferred  Medications  Current Facility-Administered Medications:  .  acetaminophen (TYLENOL) tablet 650 mg, 650  mg, Oral, Q6H PRN **OR** acetaminophen (TYLENOL) suppository 650 mg, 650 mg, Rectal, Q6H PRN, 10-01-1998, MD .  bisacodyl (DULCOLAX) EC tablet 5 mg, 5 mg, Oral, Daily PRN, Jonah Blue, MD .  dextrose 5 % in lactated ringers infusion, , Intravenous, Continuous, Arrien, Jonah Blue, MD, Last Rate: 75 mL/hr at 07/08/20 0824, New Bag at 07/08/20 0824 .  diphenhydrAMINE (BENADRYL) injection 25 mg, 25 mg, Intravenous, Q6H PRN, 07/10/20, MD, 25 mg at 07/07/20 2042 .  enoxaparin (LOVENOX) injection 40 mg, 40 mg, Subcutaneous, Q24H, 2043, MD, 40 mg at 07/07/20 1809 .  folic acid injection 1 mg, 1 mg, Intravenous, Daily, 09/04/20, MD, 1 mg at 07/08/20 1107 .  hydrALAZINE (APRESOLINE) injection 5 mg, 5 mg, Intravenous, Q4H PRN, 07/10/20, MD, 5 mg at 07/07/20 2042 .  LORazepam (ATIVAN) injection 0-4 mg, 0-4 mg, Intravenous, Q4H, 3 mg at 07/08/20 0158 **FOLLOWED BY** LORazepam (ATIVAN) injection 0-4 mg, 0-4 mg, Intravenous, Q8H, 07/10/20, MD .  LORazepam (ATIVAN) tablet 1-4 mg, 1-4 mg, Oral, Q1H PRN **OR** LORazepam (ATIVAN) injection 1-4 mg, 1-4 mg, Intravenous, Q1H PRN, Jonah Blue, MD, 2 mg at 07/07/20 2254 .  morphine 2 MG/ML injection 2 mg, 2 mg, Intravenous, Q2H PRN, 09/04/20, MD, 2 mg at 07/08/20 0158 .  multivitamin with minerals tablet 1 tablet, 1 tablet, Oral, Daily, 07/10/20, MD .  nicotine (NICODERM CQ - dosed in mg/24 hours) patch 14 mg, 14 mg, Transdermal, q1800, Jonah Blue, MD, 14 mg at 07/07/20 1809 .  ondansetron (ZOFRAN) tablet 4 mg, 4 mg, Oral, Q6H PRN **OR** ondansetron (ZOFRAN) injection 4 mg, 4 mg, Intravenous, Q6H PRN, Jonah Blue, MD .  polyethylene glycol (MIRALAX / GLYCOLAX) packet 17 g, 17 g, Oral, Daily PRN, Jonah Blue, MD .  sodium chloride flush (NS) 0.9 % injection 3 mL, 3 mL, Intravenous, Q12H, Jonah Blue, MD, 3 mL at 07/08/20 1114 .  thiamine 500mg  in normal saline (47ml) IVPB, 500 mg,  Intravenous, Daily, Pahwani, Ravi, MD, Last Rate: 100 mL/hr at 07/08/20 1112, 500 mg at 07/08/20 1112 Labs CBC    Component Value Date/Time   WBC 9.9 07/07/2020 0500   RBC 4.28 07/07/2020 0500   HGB 13.5 07/07/2020 0500   HGB 17.4 07/01/2014 0842   HCT 40.4 07/07/2020 0500   HCT 52.4 (H) 07/01/2014 0842   PLT 169 07/07/2020 0500   PLT 244 07/01/2014 0842   MCV 94.4 07/07/2020 0500   MCV 96 07/01/2014 0842   MCH 31.5 07/07/2020 0500   MCHC 33.4 07/07/2020 0500   RDW 13.2 07/07/2020 0500   RDW 13.7 07/01/2014 0842   LYMPHSABS 1.8 07/06/2020 1307   LYMPHSABS 1.9 07/01/2014 0842   MONOABS 0.9 07/06/2020 1307   MONOABS 0.9 07/01/2014 0842   EOSABS 0.1 07/06/2020 1307   EOSABS 0.2 07/01/2014 0842   BASOSABS 0.0 07/06/2020 1307   BASOSABS 0.0 07/01/2014 0842    CMP     Component Value Date/Time   NA 137 07/08/2020 0324   NA 138 07/01/2014 0842   K 3.3 (L) 07/08/2020 0324   K 4.2 07/01/2014 0842   CL 101 07/08/2020 0324   CL 107 07/01/2014 0842   CO2 24 07/08/2020 0324   CO2 25 07/01/2014 0842   GLUCOSE 96 07/08/2020 0324   GLUCOSE 114 (H) 07/01/2014 0842   BUN <5 (L) 07/08/2020 0324   BUN 12 07/01/2014 0842   CREATININE 0.63 07/08/2020 0324   CREATININE 1.07 07/01/2014 0842   CALCIUM 8.9 07/08/2020 0324   CALCIUM 9.0 07/01/2014 0842   PROT 6.1 (L) 07/08/2020 0324   PROT 7.5 07/01/2014 0842   ALBUMIN 3.3 (L) 07/08/2020 0324   ALBUMIN 4.2 07/01/2014 0842   AST 65 (H) 07/08/2020 0324   AST 27 07/01/2014 0842   ALT 69 (H) 07/08/2020 0324   ALT 84 (H) 07/01/2014 0842   ALKPHOS 64 07/08/2020 0324   ALKPHOS 92 07/01/2014 0842   BILITOT 1.4 (H) 07/08/2020 0324   BILITOT 1.3 (H) 07/01/2014 0842   GFRNONAA >60 07/08/2020 0324   GFRNONAA >60 07/01/2014 0842   GFRNONAA >60 05/03/2012 2215   GFRAA >60 03/09/2020 0236   GFRAA >60 07/01/2014 0842   GFRAA >60 05/03/2012 2215      Lipid Panel  No results found for: CHOL, TRIG, HDL, CHOLHDL, VLDL, LDLCALC,  LDLDIRECT   Imaging I have reviewed images in epic and the results pertinent to this consultation are:  CT-scan of the brain Negative for acute intracranial abnormality  Assessment: Pt is a 40 year old male with PMHsignificant for hypertension, depression,anxiety with history of suicidal ideation, alcohol dependence, tobacco dependence presented with excessive movements of extremitieswithconcern for seizures. Pt overdosed on unknown amount of Seroquel+/-BuSpar. Pt is making improvement. Pt has been running high in the ED with systolic of 103-171 and diastolic of 74-129 mmHg. He has been Afebrile. K low at 3.3, AST/ALT 65/69. Pt was put on Lorazepam taper, Benadryl was given once yesterday for Agitation. Valproic acid level <10, CK 804, TSH 1.138, Mg 1.7, Urinalysis  negative for infection. CT negative, EEG normal.   Impression: -Seroquel+/-BuSpar overdose -Extrapyramidaland Anticholinergicsymptoms -?Alcohol withdrawal  Recommendations: -Supportive care per primary team --Continue Alcohol withdrawal protocol with Ativan, thiamin and Folic acid per primary.  -Follow poison control recs. --Replete electrolytes if low by primary team. -Continue Benadryl 25 mg Q6H for treatment of extraparametal symptoms -- Avoid Antipsychotics. -Avoid QTC prolonging drugs. - MRI brain without contrast when stable - possibly tomorrow.   Dr. Karsten Ro MD PGY1 [facility] Neurology   Attending addendum Patient seen and examined Imaging reviewed personally Plan for MRI tomorrow Supportive care per primary team Spoke with mother over the phone that she is in [place], and updated her with the plan.  -- Milon Dikes, MD Neurologist [facility] Pager: [phone]

## 2020-07-08 NOTE — Plan of Care (Signed)
  Problem: Medication: Goal: Risk for medication side effects will decrease Outcome: Progressing   Problem: Clinical Measurements: Goal: Complications related to the disease process, condition or treatment will be avoided or minimized Outcome: Progressing Goal: Diagnostic test results will improve Outcome: Progressing   Problem: Safety: Goal: Verbalization of understanding the information provided will improve Outcome: Progressing   Problem: Self-Concept: Goal: Level of anxiety will decrease Outcome: Progressing Goal: Ability to verbalize feelings about condition will improve Outcome: Progressing

## 2020-07-08 NOTE — Plan of Care (Signed)

## 2020-07-08 NOTE — Progress Notes (Signed)
PROGRESS NOTE    Ryan Faulkner  GYI:[id number] DOB: 05/15/1981 DOA: 07/06/2020 PCP: Patient, No Pcp Per    Brief Narrative:  [name] was admitted to the hospital with a working diagnosis of intentional overdose with quetiapine/ buspirone, complicated with extrapyramidal and anticholinergic syndrome.  40 year old male with past medical history for hypertension, depression, anxiety, alcohol abuse and tobacco dependence who presents after an intentional overdose.  He attempted suicide via overdose on 01/09, he called his mother to say goodbye.  The following day he was delusional and disoriented. 01/11 he was found down at the hotel room around 7 in the morning, apparently overdosed with unknown amount of quetiapine +/-buspiron he he received naloxone by EMS with no significant change.  He was brought to the hospital for further evaluation. On his initial physical examination blood pressure 123/112, heart rate 123, respirate 16, oxygen saturation 99%, lungs clear to auscultation bilaterally, heart S1-S2, present, tachycardic, abdomen soft, no lower extremity edema.  Chest radiograph with bibasilar atelectasis, no frank infiltrates.   EKG 120 bpm, normal axis, normal intervals, sinus rhythm, no st segment or T wave changes.   Assessment & Plan:   Principal Problem:   Intentional overdose of drug in tablet form (HCC) Active Problems:   Major depressive disorder, recurrent severe without psychotic features (HCC)   Alcohol use disorder, severe, dependence (HCC)   Essential hypertension   Tobacco dependence   Class 1 obesity due to excess calories with body mass index (BMI) of 30.0 to 30.9 in adult    1. Suicidal attempt, quetiapine and buspirone overdose, complicated with extrapyramidal syndrome and anticholinergic syndrome. EEG with no seizures.  This am is more awake and alert, continue to have tremors and dyskinesia but improved from yesterday. Patient is under IVC.   Attempt to  remove physical restrains and advance diet to regular.  Continue close neuro checks and one to one precautions, due to suicidal attempt.   Continue with IV fluids with dextrose, continue with multivitamins including thiamine (high dose).   Psychiatry has recommended inpatient psych, will follow with social work for placement.  For now will hold on divalproex, buspirone, escitalopram and quetiapine.   2. HTN. Blood pressure 155/107 mmHg. Patient has been on 4 point restrains. At home patient on lisinopril and amlodipine that will be resumed. Continue blood pressure monitoring.   3. Tobacco and alcohol dependence.  Mentation has improved, positive tremors in his hands.  LFT's with liver enzymes trending down.  Neuro checks q 4 h. Continue with needed lorazepam per CIWA protocol.  Advance diet, continue vitamins and thiamine.   4. Hypomagnesemia/ hypokalemia. Renal function stable with serum cr at 0,63, K is 3,3 and bicarbonate at 24. Continue hydration with balanced electrolyte solutions with dextrose. Add 40 meq Kcl IV and follow up renal panel in am.   5. Obesity type 1/ chronic pain. Calculated BMI is 30,1. Continue to be at high risk for inpatient complications. Consult PT and OT  Resume naproxen and continue with as needed morphine.   Patient continue to be at high risk for worsening encephalopathy   Status is: Inpatient  Remains inpatient appropriate because:IV treatments appropriate due to intensity of illness or inability to take PO   Dispo: The patient is from: Home              Anticipated d/c is to: Inpatient psych               Anticipated d/c date is: 2 days  Patient currently is not medically stable to d/c.  DVT prophylaxis: Enoxaparin   Code Status:   full  Family Communication:  I spoke over the phone with the patient's mother about patient's  condition, plan of care, prognosis and all questions were addressed.   Consultants:   Neurology    Psychiatry       Subjective: Patient is more awake today, no nausea or vomiting, positive appetite and asking for nicotine patch.   Objective: Vitals:   07/08/20 0000 07/08/20 0430 07/08/20 0805 07/08/20 1151  BP: 133/87 103/74 (!) 155/107 (!) 148/105  Pulse: (!) 110 (!) 101 98 100  Resp: 20  20 20   Temp: 98 F (36.7 C) 98.2 F (36.8 C) 98.1 F (36.7 C) 98.1 F (36.7 C)  TempSrc: Oral Oral Oral Oral  SpO2: 99% 98% 98% 98%  Weight:      Height:        Intake/Output Summary (Last 24 hours) at 07/08/2020 1246 Last data filed at 07/08/2020 0600 Gross per 24 hour  Intake 898.52 ml  Output 1650 ml  Net -751.48 ml   Filed Weights   07/06/20 1259  Weight: 95.3 kg    Examination:   General: Not in pain or dyspnea, deconditioned  Neurology: Awake and alert, non focal, he is orientated times place and person. Positive tremors bilateral hands at rest. Mild dyskinesia.  E ENT: no pallor, no icterus, oral mucosa moist Cardiovascular: No JVD. S1-S2 present, rhythmic, no gallops, rubs, or murmurs. No lower extremity edema. Pulmonary: positive breath sounds bilaterally, with no wheezing, rhonchi or rales. Gastrointestinal. Abdomen soft and non tender Skin. Multiple ecchymosis upper and lower extremities.  Musculoskeletal: no joint deformities     Data Reviewed: I have personally reviewed following labs and imaging studies  CBC: Recent Labs  Lab 07/06/20 1307 07/06/20 1438 07/07/20 0500  WBC 9.5  --  9.9  NEUTROABS 6.6  --   --   HGB 14.8 15.0 13.5  HCT 44.3 44.0 40.4  MCV 93.9  --  94.4  PLT 166  --  169   Basic Metabolic Panel: Recent Labs  Lab 07/06/20 1307 07/06/20 1438 07/07/20 0500 07/07/20 0533 07/08/20 0324  NA 135 135 138  --  137  K 3.8 3.6 4.0  --  3.3*  CL 98  --  106  --  101  CO2 23  --  22  --  24  GLUCOSE 144*  --  93  --  96  BUN 6  --  <5*  --  <5*  CREATININE 0.72  --  0.75  --  0.63  CALCIUM 9.2  --  8.4*  --  8.9  MG  --   --   --   1.7  --    GFR: Estimated Creatinine Clearance: 143.6 mL/min (by C-G formula based on SCr of 0.63 mg/dL). Liver Function Tests: Recent Labs  Lab 07/06/20 1307 07/08/20 0324  AST 70* 65*  ALT 70* 69*  ALKPHOS 67 64  BILITOT 1.2 1.4*  PROT 6.9 6.1*  ALBUMIN 3.8 3.3*   No results for input(s): LIPASE, AMYLASE in the last 168 hours. No results for input(s): AMMONIA in the last 168 hours. Coagulation Profile: No results for input(s): INR, PROTIME in the last 168 hours. Cardiac Enzymes: Recent Labs  Lab 07/07/20 0533  CKTOTAL 804*   BNP (last 3 results) No results for input(s): PROBNP in the last 8760 hours. HbA1C: No results for input(s): HGBA1C in the last  72 hours. CBG: Recent Labs  Lab 07/06/20 1304  GLUCAP 138*   Lipid Profile: No results for input(s): CHOL, HDL, LDLCALC, TRIG, CHOLHDL, LDLDIRECT in the last 72 hours. Thyroid Function Tests: Recent Labs    07/07/20 0533  TSH 1.138   Anemia Panel: No results for input(s): VITAMINB12, FOLATE, FERRITIN, TIBC, IRON, RETICCTPCT in the last 72 hours.    Radiology Studies: I have reviewed all of the imaging during this hospital visit personally     Scheduled Meds: . enoxaparin (LOVENOX) injection  40 mg Subcutaneous Q24H  . folic acid  1 mg Intravenous Daily  . LORazepam  0-4 mg Intravenous Q4H   Followed by  . LORazepam  0-4 mg Intravenous Q8H  . multivitamin with minerals  1 tablet Oral Daily  . nicotine  14 mg Transdermal q1800  . sodium chloride flush  3 mL Intravenous Q12H   Continuous Infusions: . dextrose 5% lactated ringers 75 mL/hr at 07/08/20 0824  . thiamine injection 500 mg (07/08/20 1112)     LOS: 2 days        Harvest Deist Annett Gula, MD

## 2020-07-08 NOTE — Evaluation (Signed)
Physical Therapy Evaluation Patient Details Name: Ryan Faulkner MRN: [id number] DOB: 1981/05/26 Today's Date: 07/08/2020   History of Present Illness  Pt is a 40 y.o. male with a PMH of suicidal ideation, HTN, and alcohol dependence who presents to the hospital 2/2 intentional overdose of Seroquel 6000 mg. EtOH present on blood work. Per chart, pt is homeless and had told family and friends goodbye prior to this event. Pt with acute toxic metabolic encephalpathy in setting of OD. EEG negative for seizures. CT negative for acute intracranial abnormalities.  Clinical Impression  Pt presents with condition mentioned above and deficits mentioned below, see PT Problem List. PTA pt was recently evicted and living with friends or in motels. 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 PTA pt was independent with all functional mob and ADLs without AD/AE. Pt displays coordination and balance deficits that impact his safety with mobility, placing him below his baseline level of function. He demonstrates ataxic movements with short gait bouts (to fatigue) with and without RW in his room with min guard assist, placing him at risk for falls. Furthermore, pt's impaired cognition compared to baseline also impacts his safety as he is unaware of his deficits and his surroundings. Will continue to follow acutely. Recommending pt follow-up with therapy in SNF to address his deficits to maximize his safety and independence with functional mobility at this time.   Follow Up Recommendations SNF;Supervision/Assistance - 24 hour (may change as pt progresses though)    Equipment Recommendations  Rolling walker with 5" wheels (may change as pt progresses)    Recommendations for Other Services       Precautions / Restrictions Precautions Precautions: Fall Precaution Comments: suicide risk Restrictions Weight Bearing Restrictions: No      Mobility  Bed Mobility Overal bed mobility: Needs  Assistance Bed Mobility: Rolling;Sidelying to Sit;Sit to Supine Rolling: Min guard Sidelying to sit: Min guard   Sit to supine: Min guard   General bed mobility comments: Min guard for safety secondary to impaired cognition and impulsivity, but otherwise pt able to perform all bed mob with increased time due to poor coordination. Pt drags R arm when coming to sit up and scoot anteriorly to EOB.    Transfers Overall transfer level: Needs assistance Equipment used: None;Rolling walker (2 wheeled) Transfers: Sit to/from Stand Sit to Stand: Min guard         General transfer comment: Pt imulsively coming to stand, min guard for safety due to trunk sway but no overt LOB.  Ambulation/Gait Ambulation/Gait assistance: Min guard Gait Distance (Feet): 40 Feet Assistive device: Rolling walker (2 wheeled);None Gait Pattern/deviations: Step-through pattern;Decreased step length - right;Decreased step length - left;Decreased stride length;Ataxic;Narrow base of support Gait velocity: reduced Gait velocity interpretation: 1.31 - 2.62 ft/sec, indicative of limited community ambulator General Gait Details: Ambulates with ataxic movements, displaying decreased step length despite cues to correct. No overt LOB, but trunk sway noted progressing from utilizing RW to no AD. Increased stability with RW. Min guard for safety.  Stairs            Wheelchair Mobility    Modified Rankin (Stroke Patients Only) Modified Rankin (Stroke Patients Only) Pre-Morbid Rankin Score: No symptoms Modified Rankin: Moderately severe disability     Balance Overall balance assessment: Needs assistance Sitting-balance support: No upper extremity supported;Feet supported Sitting balance-Leahy Scale: Good Sitting balance - Comments: Able to reach off BOS to donn socks, but undershooting (dysmetria?) often requiring assistance to initiate  socks on feet, min guard for safety.   Standing balance support: No upper  extremity supported;During functional activity Standing balance-Leahy Scale: Fair Standing balance comment: No UE support required and no overt LOB, but unsteadiness noted and min guard required for safety with standing mobility.                             Pertinent Vitals/Pain Pain Assessment: Faces Faces Pain Scale: Hurts a little bit Pain Location: L side of body Pain Descriptors / Indicators: Aching Pain Intervention(s): Limited activity within patient's tolerance;Monitored during session;Repositioned    Home Living Family/patient expects to be discharged to:: Shelter/Homeless                 Additional Comments: Per mother via phone call, pt was recently evicted from trailer home. He has been staying with friends and in motel rooms since the eviction. 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 Mother reports unknown plan at d/c for where he will live and if anyone is available to assist him. Does not own any AD/AE.    Prior Function Level of Independence: Independent         Comments: Independent with all functional mob and ADL's, per mother.     Hand Dominance        Extremity/Trunk Assessment   Upper Extremity Assessment Upper Extremity Assessment: Defer to OT evaluation    Lower Extremity Assessment Lower Extremity Assessment: RLE deficits/detail;LLE deficits/detail RLE Deficits / Details: MMT scores of 4+ to 5 grossly RLE Sensation: WNL RLE Coordination: decreased gross motor LLE Deficits / Details: MMT scores of 4+ to 5 grossly LLE Sensation: WNL LLE Coordination: decreased gross motor    Cervical / Trunk Assessment Cervical / Trunk Assessment: Normal  Communication   Communication: No difficulties  Cognition Arousal/Alertness: Awake/alert Behavior During Therapy: Impulsive Overall Cognitive Status: Impaired/Different from baseline Area of Impairment: Orientation;Attention;Memory;Following  commands;Safety/judgement;Awareness;Problem solving                 Orientation Level: Disoriented to;Place;Time;Situation Current Attention Level: Sustained Memory: Decreased short-term memory Following Commands: Follows one step commands consistently;Follows one step commands with increased time Safety/Judgement: Decreased awareness of safety;Decreased awareness of deficits Awareness: Emergent Problem Solving: Slow processing;Requires verbal cues;Difficulty sequencing General Comments: Pt disoriented to location, situation, and date ("January or February 2021"). Pt unaware of deficits or lines/leads impacting his safety, impulsively trying to come to stand often despite cues otherwise.      General Comments      Exercises     Assessment/Plan    PT Assessment Patient needs continued PT services  PT Problem List Decreased activity tolerance;Decreased balance;Decreased mobility;Decreased coordination;Decreased cognition;Decreased knowledge of use of DME;Decreased safety awareness;Decreased knowledge of precautions       PT Treatment Interventions DME instruction;Gait training;Stair training;Functional mobility training;Therapeutic activities;Therapeutic exercise;Balance training;Neuromuscular re-education;Cognitive remediation;Patient/family education    PT Goals (Current goals can be found in the Care Plan section)  Acute Rehab PT Goals Patient Stated Goal: did not state; mother desires for pt to improve PT Goal Formulation: With patient/family Time For Goal Achievement: 07/22/20 Potential to Achieve Goals: Good    Frequency Min 3X/week   Barriers to discharge Decreased caregiver support;Inaccessible home environment (pt homeless with unknown level of support at d/c)      Co-evaluation               AM-PAC PT "6 Clicks" Mobility  Outcome Measure Help needed  turning from your back to your side while in a flat bed without using bedrails?: A Little Help needed  moving from lying on your back to sitting on the side of a flat bed without using bedrails?: A Little Help needed moving to and from a bed to a chair (including a wheelchair)?: A Little Help needed standing up from a chair using your arms (e.g., wheelchair or bedside chair)?: A Little Help needed to walk in hospital room?: A Little Help needed climbing 3-5 steps with a railing? : A Lot 6 Click Score: 17    End of Session Equipment Utilized During Treatment: Gait belt Activity Tolerance: Patient limited by fatigue Patient left: in bed;with call bell/phone within reach;with bed alarm set;with nursing/sitter in room Nurse Communication: Mobility status PT Visit Diagnosis: Unsteadiness on feet (R26.81);Other abnormalities of gait and mobility (R26.89);Ataxic gait (R26.0);Difficulty in walking, not elsewhere classified (R26.2)    Time: 5400-8676 PT Time Calculation (min) (ACUTE ONLY): 17 min   Charges:   PT Evaluation $PT Eval Low Complexity: 1 Low          Raymond Gurney, PT, DPT Acute Rehabilitation Services  Pager: [phone] Office: [phone]   Jewel Baize 07/08/2020, 2:12 PM

## 2020-07-09 ENCOUNTER — Inpatient Hospital Stay (HOSPITAL_COMMUNITY): Payer: BC Managed Care – PPO

## 2020-07-09 LAB — BASIC METABOLIC PANEL
Anion gap: 10 (ref 5–15)
BUN: 7 mg/dL (ref 6–20)
CO2: 24 mmol/L (ref 22–32)
Calcium: 9.2 mg/dL (ref 8.9–10.3)
Chloride: 105 mmol/L (ref 98–111)
Creatinine, Ser: 0.82 mg/dL (ref 0.61–1.24)
GFR, Estimated: >60 mL/min (ref >60)
Glucose, Bld: 110 mg/dL — ABNORMAL HIGH (ref 70–99)
Potassium: 3.9 mmol/L (ref 3.5–5.1)
Sodium: 139 mmol/L (ref 135–145)

## 2020-07-09 LAB — MAGNESIUM: Magnesium: 2.2 mg/dL (ref 1.7–2.4)

## 2020-07-09 MED ORDER — NICOTINE 21 MG/24HR TD PT24
21.0000 mg | MEDICATED_PATCH | Freq: Every day | TRANSDERMAL | Status: DC
  Administered 2020-07-09 – 2020-07-13 (×5): 21 mg via TRANSDERMAL
  Filled 2020-07-09 (×5): qty 1

## 2020-07-09 MED ORDER — THIAMINE HCL 100 MG PO TABS
100.0000 mg | ORAL_TABLET | Freq: Every day | ORAL | Status: DC
Start: 2020-07-09 — End: 2020-07-09

## 2020-07-09 MED ORDER — DICLOFENAC SODIUM 1 % EX GEL
4.0000 g | Freq: Four times a day (QID) | CUTANEOUS | Status: DC
  Administered 2020-07-09 – 2020-07-10 (×3): 4 g via TOPICAL
  Filled 2020-07-09: qty 100

## 2020-07-09 MED ORDER — THIAMINE HCL 100 MG PO TABS
100.0000 mg | ORAL_TABLET | Freq: Every day | ORAL | Status: DC
  Administered 2020-07-10 – 2020-07-14 (×5): 100 mg via ORAL
  Filled 2020-07-09 (×7): qty 1

## 2020-07-09 MED ORDER — GADOBUTROL 1 MMOL/ML IV SOLN
10.0000 mL | Freq: Once | INTRAVENOUS | Status: AC | PRN
  Administered 2020-07-09: 10 mL via INTRAVENOUS

## 2020-07-09 MED ORDER — FOLIC ACID 1 MG PO TABS
1.0000 mg | ORAL_TABLET | Freq: Every day | ORAL | Status: DC
  Administered 2020-07-10 – 2020-07-14 (×5): 1 mg via ORAL
  Filled 2020-07-09 (×5): qty 1

## 2020-07-09 MED ORDER — DIPHENHYDRAMINE HCL 25 MG PO CAPS: 25.0000 mg | ORAL_CAPSULE | Freq: Four times a day (QID) | ORAL | Status: DC | PRN

## 2020-07-09 NOTE — Evaluation (Signed)
Occupational Therapy Evaluation Patient Details Name: Ryan Faulkner MRN: [id number] DOB: Jan 15, 1981 Today's Date: 07/09/2020    History of Present Illness Pt is a 40 y.o. male with a PMH of suicidal ideation, HTN, and alcohol dependence who presents to the hospital 2/2 intentional overdose of Seroquel 6000 mg. EtOH present on blood work. Per chart, pt is homeless and had told family and friends goodbye prior to this event. Pt with acute toxic metabolic encephalpathy in setting of OD. EEG negative for seizures. CT negative for acute intracranial abnormalities.   Clinical Impression   PTA patient was independent with ADLs/IADLs and was working full-time as a Pharmacist, community. Patient currently presents with deficits in balance and cognition demonstrating mild STM deficits, decreased safety awareness, and decreased problem solving. Patient also demonstrates deficits in RUE AROM with limited shoulder flexion noting difficulties at baseline that have since exacerbated. Patient currently requiring Min guard to Min A grossly for ADLs without AD/AE. Patient with no recollection of injury to R shoulder. Patient would benefit from continued acute OT services in prep for safe d/c. Current recommendation for OPOT pending safe d/c plan. PTA patient reports homelessness staying between friends homes and motels. OT to continue to follow acutely.     Follow Up Recommendations  Outpatient OT;Supervision - Intermittent    Equipment Recommendations  None recommended by OT    Recommendations for Other Services       Precautions / Restrictions Precautions Precautions: Fall Precaution Comments: 1:1 sitter, suicide risk, flight risk Restrictions Weight Bearing Restrictions: No      Mobility Bed Mobility Overal bed mobility: Needs Assistance Bed Mobility: Supine to Sit     Supine to sit: Supervision     General bed mobility comments: Supervision A for safety.    Transfers Overall transfer level: Needs  assistance Equipment used: None Transfers: Sit to/from Stand Sit to Stand: Min guard         General transfer comment: Min guard for safety. Patient with impulsivity standing before therapist instructed patient to do so.    Balance Overall balance assessment: Needs assistance Sitting-balance support: No upper extremity supported;Feet supported Sitting balance-Leahy Scale: Good Sitting balance - Comments: Able to doff/don footwear seated EOB in figure-4 position with noted difficulty 2/2 decreased AROM in R shoulder. No overt LOB.   Standing balance support: No upper extremity supported;During functional activity Standing balance-Leahy Scale: Fair Standing balance comment: No external assist or AD required to maintain standing balance this date.                           ADL either performed or assessed with clinical judgement   ADL Overall ADL's : Needs assistance/impaired Eating/Feeding: Set up;Sitting   Grooming: Set up;Sitting           Upper Body Dressing : Minimal assistance;Sitting   Lower Body Dressing: Minimal assistance;Sit to/from stand   Toilet Transfer: Hydrographic surveyor Details (indicate cue type and reason): Simulated with tranfser to recliner. No AD.         Functional mobility during ADLs: Min guard General ADL Comments: Min guard for short distance functional mobility in hospital room with Min guard and no AD.     Vision Baseline Vision/History: No visual deficits Patient Visual Report: No change from baseline Vision Assessment?: No apparent visual deficits     Perception     Praxis      Pertinent Vitals/Pain Pain Assessment: 0-10 Pain Score: 6  Pain Location: R shoulder Pain Descriptors / Indicators: Sore Pain Intervention(s): Limited activity within patient's tolerance;Monitored during session;Repositioned     Hand Dominance     Extremity/Trunk Assessment Upper Extremity Assessment Upper Extremity Assessment: RUE  deficits/detail RUE Deficits / Details: PROM WFL. Very limited active shoulder flexion POA but patient reports worsening. Able to utilize RUE in donning footwear. AROM in elbow (limited 2/2 placement of IV), wrist, and digits WFL. RUE Sensation: WNL RUE Coordination: decreased gross motor   Lower Extremity Assessment Lower Extremity Assessment: Defer to PT evaluation   Cervical / Trunk Assessment Cervical / Trunk Assessment: Normal   Communication Communication Communication: No difficulties   Cognition Arousal/Alertness: Awake/alert Behavior During Therapy: Impulsive Overall Cognitive Status: Impaired/Different from baseline Area of Impairment: Safety/judgement;Awareness;Problem solving                         Safety/Judgement: Decreased awareness of safety   Problem Solving: Slow processing General Comments: A&Ox4. Patient impulsive requiring cues for safety.   General Comments  Significant redness/bruising to RUE>LUE.    Exercises     Shoulder Instructions      Home Living Family/patient expects to be discharged to:: Shelter/Homeless                                 Additional Comments: Per mother via phone call, pt was recently evicted from trailer home. He has been staying with friends and in motel rooms since the eviction. 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 Mother reports unknown plan at d/c for where he will live and if anyone is available to assist him. Does not own any AD/AE.      Prior Functioning/Environment Level of Independence: Independent        Comments: Working full-time as a Sales promotion account executive Problem List: Pain      OT Treatment/Interventions: Environmental manager;Therapeutic exercise;DME and/or AE instruction;Therapeutic activities;Patient/family education;Balance training    OT Goals(Current goals can be found in the care plan section) Acute Rehab OT Goals Patient Stated Goal: To return to work. OT  Goal Formulation: With patient Time For Goal Achievement: 07/23/20 Potential to Achieve Goals: Good ADL Goals Additional ADL Goal #1: Patient will complete A.M. ADLs with Mod I and AE PRN demonstrating good safety awareness. Additional ADL Goal #2: Patient will complete RUE HEP x2 daily to improve AROM and strength in prep for return to work. Additional ADL Goal #3: Patient will recall 3 stress management technqiues in prep for safe d/c.  OT Frequency: Min 2X/week   Barriers to D/C:            Co-evaluation              AM-PAC OT "6 Clicks" Daily Activity     Outcome Measure Help from another person eating meals?: A Little Help from another person taking care of personal grooming?: A Little Help from another person toileting, which includes using toliet, bedpan, or urinal?: A Little Help from another person bathing (including washing, rinsing, drying)?: A Little Help from another person to put on and taking off regular upper body clothing?: A Little Help from another person to put on and taking off regular lower body clothing?: A Little 6 Click Score: 18   End of Session Equipment Utilized During Treatment: Gait belt  Activity Tolerance: Patient tolerated treatment well Patient left: in  chair;with call bell/phone within reach;with chair alarm set;with nursing/sitter in room  OT Visit Diagnosis: Muscle weakness (generalized) (M62.81);Pain Pain - Right/Left: Right Pain - part of body: Shoulder                Time: 2505-3976 OT Time Calculation (min): 22 min Charges:  OT General Charges $OT Visit: 1 Visit OT Evaluation $OT Eval Low Complexity: 1 Low  Destanae H. OTR/L Supplemental OT, Department of rehab services [phone]  Destanae R H. 07/09/2020, 10:09 AM

## 2020-07-09 NOTE — Progress Notes (Addendum)
Neurology Progress Note   S://  Patient is seen and examined today.  He is alert and oriented to self, age, knows the year, month and date.  Today, he stated that he took 25 tablets of BuSpar only.  He states that he has some pain and weakness in right arm and he is not able to keep it up against gravity.   O:// Current vital signs: BP (!) 145/98 (BP Location: Left Arm)   Pulse 94   Temp 98.2 F (36.8 C) (Oral)   Resp 18   Ht 5\' 10"  (1.778 m)   Wt 95.3 kg   SpO2 98%   BMI 30.13 kg/m  Vital signs in last 24 hours: Temp:  [97.3 F (36.3 C)-98.5 F (36.9 C)] 98.2 F (36.8 C) (01/14 0756) Pulse Rate:  [90-108] 94 (01/14 0756) Resp:  [18-20] 18 (01/14 0756) BP: (126-149)/(89-108) 145/98 (01/14 0756) SpO2:  [97 %-99 %] 98 % (01/14 0756) GENERAL: Awake, alert in NAD HEENT: - Normocephalic and atraumatic, dry mucous membranes. LUNGS - Symmetrical chest rise, No labored breathing noted CV - no JVD, No Peripheral Edema ABDOMEN - Soft,  nondistended  Ext: warm, well perfused, intact peripheral pulses, no Peripheral edema. NEURO:  Mental Status: AA&Ox4  Oriented to self, age, place, and why Pt  is here.  Good attention and Concentration.  Language: speech is mildly dysarthric (mostly due to dry mouth).  Pt is able to name simple objects, repetition intact,  fluency, and comprehension intact. Cranial Nerves: PERRL, EOMI, visual fields full, no facial asymmetry, facial sensation intact, hearing intact, tongue/uvula/soft palate midline, normal sternocleidomastoid and trapezius muscle strength. No evidence of tongue atrophy or fibrillations Motor: Can move Rt arm up against gravity for few seconds only but much lower as compared to Lt arm. Pt not able to keep Rt hand up against gravity for long and has painful restrictive movement at Rt shoulder and arm. Drift in Rt upper extremity noted, No drift in Lt UE. No Drift in b/l LE's. Strength 3+/5 in Rt UE, 5/5 in Lt UE, Strength in Rt LE  5/5, Lt  LE 5/5 Reflexes- UE -2+, LE- Mildly brisk , B/L Planters- Down going Tone: is normal and bulk is normal Sensation- Intact to light touch on Lt UE, Decreased on RUE, Intact on LUE and symmetrical on B/L LE's.   Coordination: FTN intact let side, Not able to assess with RT hand because of weakness and restriction due to pain. , no ataxia in BLE. Gait- deferred   Medications  Current Facility-Administered Medications:  .  acetaminophen (TYLENOL) tablet 650 mg, 650 mg, Oral, Q6H PRN **OR** acetaminophen (TYLENOL) suppository 650 mg, 650 mg, Rectal, Q6H PRN, 10-30-1997, MD .  amLODipine (NORVASC) tablet 10 mg, 10 mg, Oral, Daily, Arrien, Jonah Blue, MD, 10 mg at 07/09/20 0956 .  bisacodyl (DULCOLAX) EC tablet 5 mg, 5 mg, Oral, Daily PRN, 07/11/20, MD .  dextrose 5 % in lactated ringers infusion, , Intravenous, Continuous, Arrien, Jonah Blue, MD, Last Rate: 75 mL/hr at 07/08/20 2240, New Bag at 07/08/20 2240 .  diphenhydrAMINE (BENADRYL) injection 25 mg, 25 mg, Intravenous, Q6H PRN, 07/10/20, MD, 25 mg at 07/07/20 2042 .  enoxaparin (LOVENOX) injection 40 mg, 40 mg, Subcutaneous, Q24H, 2043, MD, 40 mg at 07/08/20 1704 .  folic acid injection 1 mg, 1 mg, Intravenous, Daily, 07/10/20, MD, 1 mg at 07/09/20 0956 .  hydrALAZINE (APRESOLINE) injection 5 mg, 5 mg, Intravenous, Q4H PRN, 07/11/20,  MD, 5 mg at 07/07/20 2042 .  lisinopril (ZESTRIL) tablet 10 mg, 10 mg, Oral, Daily, Arrien, York Ram, MD, 10 mg at 07/09/20 0956 .  [EXPIRED] LORazepam (ATIVAN) injection 0-4 mg, 0-4 mg, Intravenous, Q4H, 3 mg at 07/08/20 0158 **FOLLOWED BY** LORazepam (ATIVAN) injection 0-4 mg, 0-4 mg, Intravenous, Q8H, Jonah Blue, MD .  LORazepam (ATIVAN) tablet 1-4 mg, 1-4 mg, Oral, Q1H PRN **OR** LORazepam (ATIVAN) injection 1-4 mg, 1-4 mg, Intravenous, Q1H PRN, Jonah Blue, MD, 2 mg at 07/07/20 2254 .  morphine 2 MG/ML injection 2 mg, 2 mg, Intravenous, Q2H  PRN, Jonah Blue, MD, 2 mg at 07/08/20 0158 .  multivitamin with minerals tablet 1 tablet, 1 tablet, Oral, Daily, Jonah Blue, MD, 1 tablet at 07/09/20 0956 .  naproxen (NAPROSYN) tablet 500 mg, 500 mg, Oral, BID WC, Arrien, York Ram, MD, 500 mg at 07/09/20 0956 .  nicotine (NICODERM CQ - dosed in mg/24 hours) patch 14 mg, 14 mg, Transdermal, q1800, Jonah Blue, MD, 14 mg at 07/08/20 1705 .  ondansetron (ZOFRAN) tablet 4 mg, 4 mg, Oral, Q6H PRN **OR** ondansetron (ZOFRAN) injection 4 mg, 4 mg, Intravenous, Q6H PRN, Jonah Blue, MD .  polyethylene glycol (MIRALAX / GLYCOLAX) packet 17 g, 17 g, Oral, Daily PRN, Jonah Blue, MD .  sodium chloride flush (NS) 0.9 % injection 3 mL, 3 mL, Intravenous, Q12H, Jonah Blue, MD, 3 mL at 07/09/20 0958 .  thiamine 500mg  in normal saline (68ml) IVPB, 500 mg, Intravenous, Daily, Pahwani, Ravi, MD, Last Rate: 100 mL/hr at 07/09/20 1006, 500 mg at 07/09/20 1006 Labs CBC    Component Value Date/Time   WBC 9.9 07/07/2020 0500   RBC 4.28 07/07/2020 0500   HGB 13.5 07/07/2020 0500   HGB 17.4 07/01/2014 0842   HCT 40.4 07/07/2020 0500   HCT 52.4 (H) 07/01/2014 0842   PLT 169 07/07/2020 0500   PLT 244 07/01/2014 0842   MCV 94.4 07/07/2020 0500   MCV 96 07/01/2014 0842   MCH 31.5 07/07/2020 0500   MCHC 33.4 07/07/2020 0500   RDW 13.2 07/07/2020 0500   RDW 13.7 07/01/2014 0842   LYMPHSABS 1.8 07/06/2020 1307   LYMPHSABS 1.9 07/01/2014 0842   MONOABS 0.9 07/06/2020 1307   MONOABS 0.9 07/01/2014 0842   EOSABS 0.1 07/06/2020 1307   EOSABS 0.2 07/01/2014 0842   BASOSABS 0.0 07/06/2020 1307   BASOSABS 0.0 07/01/2014 0842    CMP     Component Value Date/Time   NA 139 07/09/2020 0413   NA 138 07/01/2014 0842   K 3.9 07/09/2020 0413   K 4.2 07/01/2014 0842   CL 105 07/09/2020 0413   CL 107 07/01/2014 0842   CO2 24 07/09/2020 0413   CO2 25 07/01/2014 0842   GLUCOSE 110 (H) 07/09/2020 0413   GLUCOSE 114 (H) 07/01/2014 0842    BUN 7 07/09/2020 0413   BUN 12 07/01/2014 0842   CREATININE 0.82 07/09/2020 0413   CREATININE 1.07 07/01/2014 0842   CALCIUM 9.2 07/09/2020 0413   CALCIUM 9.0 07/01/2014 0842   PROT 6.1 (L) 07/08/2020 0324   PROT 7.5 07/01/2014 0842   ALBUMIN 3.3 (L) 07/08/2020 0324   ALBUMIN 4.2 07/01/2014 0842   AST 65 (H) 07/08/2020 0324   AST 27 07/01/2014 0842   ALT 69 (H) 07/08/2020 0324   ALT 84 (H) 07/01/2014 0842   ALKPHOS 64 07/08/2020 0324   ALKPHOS 92 07/01/2014 0842   BILITOT 1.4 (H) 07/08/2020 0324   BILITOT 1.3 (H) 07/01/2014  0842   GFRNONAA >60 07/09/2020 0413   GFRNONAA >60 07/01/2014 0842   GFRNONAA >60 05/03/2012 2215   GFRAA >60 03/09/2020 0236   GFRAA >60 07/01/2014 0842   GFRAA >60 05/03/2012 2215    glycosylated hemoglobin  Lipid Panel  No results found for: CHOL, TRIG, HDL, CHOLHDL, VLDL, LDLCALC, LDLDIRECT   Imaging I have reviewed images in epic and the results pertinent to this consultation are:  CT-scan of the brain Negative for acute intracranial abnormality   Assessment: Pt is a 40 year old male with PMHsignificant for hypertension, depression,anxiety with history of suicidal ideation, alcohol dependence, tobacco dependence presented with excessive movements of extremitieswithconcern for seizures. Pt overdosed on unknown amount of BuSpar+/-Seroquel. Pt is on Lorazepam taper. Valproic acid level <10, CK 804 (07/07/20),  CT negative, EEG normal. Pt is making improvement from Neurological standpoint. Pt shows some focal weakness and painful restrictive movements of Rt UE and will need X rays. This is  likely due to fall and injury to RT shoulder/arm when he overdosed. We let the primary team know about that.  No other neurological testing or work up needed from Neurological standpoint. Neurology will sign off if MRI normal.   Impression: BuSpar +/-Seroquel  overdose -Extrapyramidaland Anticholinergicsymptoms -?Alcohol withdrawal -? Shoulder  dislocation/fracture.  Recommendations: -Supportive care per primary team --Continue Alcohol withdrawal protocol with Ativan, thiamin and Folic acid per primary.  - Benadryl as needed, p.o. -- Avoid Antipsychotics. -Avoid QTC prolonging drugs. -- Primary team was made aware about possible ? Rt Shoulder dislocation/fracture - MRI Brain W WO contrast today-please call if any acute abnormality seen. -- Psychiatry follow-up Neurology will be available as needed.  Please call with questions.   Dr. Karsten Ro MD PGY1 [facility]   Attending Neurohospitalist Addendum Patient seen and examined with APP/Resident. Agree with the history and physical as documented above. Agree with the plan as documented, which I helped formulate. I have independently reviewed the chart, obtained history, review of systems and examined the patient.I have personally reviewed pertinent head/neck/spine imaging (CT/MRI). Please feel free to call with any questions. --- Milon Dikes, MD [facility] Pager: [phone]  If 7pm to 7am, please call on call as listed on AMION.

## 2020-07-09 NOTE — Progress Notes (Signed)
PT Cancellation Note  Patient Details Name: Ryan Faulkner MRN: [id number] DOB: [birth year]   Cancelled Treatment:    Reason Eval/Treat Not Completed: Patient at procedure or test/unavailable. Pt out of room getting MRI. Will follow-up as able.  Raymond Gurney, PT, DPT Acute Rehabilitation Services  Pager: [phone] Office: [phone]    Jewel Baize 07/09/2020, 1:28 PM

## 2020-07-09 NOTE — Progress Notes (Signed)
PROGRESS NOTE    CHRISTYAN REGER  MVH:[id number] DOB: 1980/10/02 DOA: 07/06/2020 PCP: Patient, No Pcp Per    Brief Narrative:  Mr. Mcgreal admitted to the hospital with a working diagnosis of intentional overdose with quetiapine/ buspirone,complicated withextrapyramidal andanticholinergicsyndrome.  40 year old male with past medical history for hypertension, depression, anxiety, alcohol abuse and tobacco dependence who presents after an intentional overdose. He attempted suicide via overdose on 01/09,he called his mother to say goodbye. The following day he was delusional and disoriented. 01/11he was found down at the hotel room around 7 in the morning, apparently overdosed with unknown amount of quetiapine +/-buspiron he he received naloxone by EMS with no significant change. He was brought to the hospital for further evaluation. On his initial physical examination blood pressure 123/112, heart rate 123, respirate 16, oxygen saturation 99%, lungs clear to auscultation bilaterally, heart S1-S2, present, tachycardic, abdomen soft, no lower extremity edema.  Chest radiograph with bibasilar atelectasis, no frank infiltrates.   EKG 120 bpm, normal axis, normal intervals, sinus rhythm, no st segment or T wave changes.  Patient placed under supportive medical care with slow improvement of his symptoms.  Initially required 4 point restrains.   Psychiatry consulted and recommendations for inpatient psych. Patient is involuntary committed due to high suicidal risk.    Assessment & Plan:   Principal Problem:   Intentional overdose of drug in tablet form (HCC) Active Problems:   Major depressive disorder, recurrent severe without psychotic features (HCC)   Alcohol use disorder, severe, dependence (HCC)   Essential hypertension   Tobacco dependence   Class 1 obesity due to excess calories with body mass index (BMI) of 30.0 to 30.9 in adult   1. Suicidal attempt, quetiapine and  buspirone overdose, complicated with extrapyramidal syndrome and anticholinergic syndrome. EEG with no seizures.  Patient's mentation continue to improve, now off restrains, continue with one to one sitter for suicidal precautions. He is calm and cooperative.  Brain MRI with no acute changes, questionable changes in T2 signal in the medial thalami to hypothalamic region, can indicate Wernicke encephalopathy.   Patient at home on divalproex, buspirone, escitalopram and quetiapine.   Continue suicidal precautions, pending transfer to inpatient psych, now patient is medically stable for transfer. Continue IVC   2. HTN. Blood pressure 145/98. Continue blood pressure control with amlodipine and lisinopril.   3. Tobacco and alcohol dependence.  Symptoms have been improving, no significant tremors.   On CIWA protocol, last dose on 07/07/20. Continue neuro checks per unit protocol On multivitamins and thiamine.   4. Hypomagnesemia/ hypokalemia. electrolytes have been corrected with k at 3,9 and Na at 139 with mg at 2,2 and bicarbonate at 24. Renal function with cr at 0,82.   5. Obesity type 1/ chronic pain/ right shoulder pain, suspected rotator cuff tear. Calculated BMI is 30,1. Continue with naproxen and continue with as needed morphine.  Films from right shoulder with no fractures. Add topical diclofenac and continue physical therapy.     Status is: Inpatient  Remains inpatient appropriate because:Unsafe d/c plan   Dispo: The patient is from: Home              Anticipated d/c is to: inpatient psych              Anticipated d/c date is: 1 day              Patient currently is medically stable to d/c.   DVT prophylaxis: Enoxaparin   Code Status:  full  Family Communication:  No family at the bedside      Consultants:   Neurology   Psychiatry     Subjective: Patient seating at the side of the bed, calm and not confused. Positive right shoulder pain, worse with  movement, no nausea or vomiting.   Objective: Vitals:   07/08/20 2354 07/09/20 0338 07/09/20 0756 07/09/20 1144  BP: (!) 136/108 (!) 149/107 (!) 145/98 (!) 139/105  Pulse: 91 90 94 100  Resp: 18 20 18 20   Temp: 98 F (36.7 C) 97.9 F (36.6 C) 98.2 F (36.8 C) 98.6 F (37 C)  TempSrc: Oral Oral Oral Oral  SpO2: 99% 98% 98% 98%  Weight:      Height:        Intake/Output Summary (Last 24 hours) at 07/09/2020 1311 Last data filed at 07/09/2020 07/11/2020 Gross per 24 hour  Intake 2058.29 ml  Output 1800 ml  Net 258.29 ml   Filed Weights   07/06/20 1259  Weight: 95.3 kg    Examination:   General: Not in pain or dyspnea  Neurology: Awake and alert, non focal  E ENT: mild pallor, no icterus, oral mucosa moist Cardiovascular: No JVD. S1-S2 present, rhythmic, no gallops, rubs, or murmurs. No lower extremity edema. Pulmonary: positive breath sounds bilaterally, adequate air movement, no wheezing, rhonchi or rales. Gastrointestinal. Abdomen soft and non tender Skin. Right heal blister.  Musculoskeletal: positive pain to palpation at the right shoulder     Data Reviewed: I have personally reviewed following labs and imaging studies  CBC: Recent Labs  Lab 07/06/20 1307 07/06/20 1438 07/07/20 0500  WBC 9.5  --  9.9  NEUTROABS 6.6  --   --   HGB 14.8 15.0 13.5  HCT 44.3 44.0 40.4  MCV 93.9  --  94.4  PLT 166  --  169   Basic Metabolic Panel: Recent Labs  Lab 07/06/20 1307 07/06/20 1438 07/07/20 0500 07/07/20 0533 07/08/20 0324 07/09/20 0413  NA 135 135 138  --  137 139  K 3.8 3.6 4.0  --  3.3* 3.9  CL 98  --  106  --  101 105  CO2 23  --  22  --  24 24  GLUCOSE 144*  --  93  --  96 110*  BUN 6  --  <5*  --  <5* 7  CREATININE 0.72  --  0.75  --  0.63 0.82  CALCIUM 9.2  --  8.4*  --  8.9 9.2  MG  --   --   --  1.7  --  2.2   GFR: Estimated Creatinine Clearance: 140.1 mL/min (by C-G formula based on SCr of 0.82 mg/dL). Liver Function Tests: Recent Labs  Lab  07/06/20 1307 07/08/20 0324  AST 70* 65*  ALT 70* 69*  ALKPHOS 67 64  BILITOT 1.2 1.4*  PROT 6.9 6.1*  ALBUMIN 3.8 3.3*   No results for input(s): LIPASE, AMYLASE in the last 168 hours. No results for input(s): AMMONIA in the last 168 hours. Coagulation Profile: No results for input(s): INR, PROTIME in the last 168 hours. Cardiac Enzymes: Recent Labs  Lab 07/07/20 0533  CKTOTAL 804*   BNP (last 3 results) No results for input(s): PROBNP in the last 8760 hours. HbA1C: No results for input(s): HGBA1C in the last 72 hours. CBG: Recent Labs  Lab 07/06/20 1304  GLUCAP 138*   Lipid Profile: No results for input(s): CHOL, HDL, LDLCALC, TRIG, CHOLHDL, LDLDIRECT in the  last 72 hours. Thyroid Function Tests: Recent Labs    07/07/20 0533  TSH 1.138   Anemia Panel: No results for input(s): VITAMINB12, FOLATE, FERRITIN, TIBC, IRON, RETICCTPCT in the last 72 hours.    Radiology Studies: I have reviewed all of the imaging during this hospital visit personally     Scheduled Meds: . amLODipine  10 mg Oral Daily  . enoxaparin (LOVENOX) injection  40 mg Subcutaneous Q24H  . [START ON 07/10/2020] folic acid  1 mg Oral Daily  . lisinopril  10 mg Oral Daily  . LORazepam  0-4 mg Intravenous Q8H  . multivitamin with minerals  1 tablet Oral Daily  . naproxen  500 mg Oral BID WC  . nicotine  21 mg Transdermal q1800  . sodium chloride flush  3 mL Intravenous Q12H   Continuous Infusions: . dextrose 5% lactated ringers 75 mL/hr at 07/08/20 2240     LOS: 3 days        Daaiyah Baumert Annett Gula, MD

## 2020-07-09 NOTE — Progress Notes (Signed)
Physical Therapy Treatment Patient Details Name: Ryan Faulkner MRN: [id number] DOB: 1980-10-15 Today's Date: 07/09/2020    History of Present Illness Pt is a 40 y.o. male with a PMH of suicidal ideation, HTN, and alcohol dependence who presents to the hospital 2/2 intentional overdose of Seroquel 6000 mg. EtOH present on blood work. Per chart, pt is homeless and had told family and friends goodbye prior to this event. Pt with acute toxic metabolic encephalpathy in setting of OD. EEG negative for seizures. CT negative for acute intracranial abnormalities.    PT Comments    Pt significantly progressed in cognition and functional status compared to yesterday's session. Pt A&Ox4, but does continue to display minor deficits in safety awareness. However, his balance and coordination have improved in that he required supervision for ambulation in hallways without AD/AE and no LOB. His DGI score of 20 this date also suggests low risk for falls. Pt does display some minor coordination deficits with stairs though, resulting in poor foot clearance and minor tripping with min guard to recover. Pt reports this is due to the pain from "blisters" on his L heel and R foot (examined and notified RN of these). Pt does report R shoulder pain with him being (-) for R shoulder Lift-off test and infraspinatus test but testing (+) for Drop arm sign test and pain with palpation to muscle belly and insertion site of supraspinatus muscle on R shoulder, notified MD. X-ray of shoulder this date was negative though. Will continue to follow-up acutely, but pt is close to his reported and assumed baseline level of function and expect he will return to baseline as cognition continues to improve.    Follow Up Recommendations  No PT follow up     Equipment Recommendations  None recommended by PT    Recommendations for Other Services       Precautions / Restrictions Precautions Precautions: Fall Precaution Comments: 1:1  sitter, suicide risk, flight risk Restrictions Weight Bearing Restrictions: No    Mobility  Bed Mobility               General bed mobility comments: Pt sitting EOB upon arrival.  Transfers Overall transfer level: Needs assistance Equipment used: None Transfers: Sit to/from Stand Sit to Stand: Supervision         General transfer comment: Pt able to come to stand quickly without LOB, minor trunk sway, supervision for safety.  Ambulation/Gait Ambulation/Gait assistance: Supervision Gait Distance (Feet): 300 Feet Assistive device: None Gait Pattern/deviations: Step-through pattern;Decreased stride length Gait velocity: WFL (when cued to increase speed) Gait velocity interpretation: >4.37 ft/sec, indicative of normal walking speed General Gait Details: Ambulates with decreased stride length without LOB, supervision for safety.   Stairs Stairs: Yes Stairs assistance: Min guard Stair Management: Alternating pattern;One rail Right;One rail Left Number of Stairs: 7 (4 standard height, 3 shorter steps) General stair comments: Ascending and descending stairs quickly, with moments of poor coordination to dorsiflex foot resulting in tripping on edge of stair intermittently, min guard to recover. Cued to slow down and focus on safety.   Wheelchair Mobility    Modified Rankin (Stroke Patients Only) Modified Rankin (Stroke Patients Only) Pre-Morbid Rankin Score: No symptoms Modified Rankin: Moderately severe disability     Balance Overall balance assessment: Needs assistance Sitting-balance support: No upper extremity supported;Feet supported Sitting balance-Leahy Scale: Good Sitting balance - Comments: No LOB sitting EOB, supervision for safety.   Standing balance support: No upper extremity supported;During functional activity Standing balance-Leahy  Scale: Good Standing balance comment: No UE support for mobility. Minor LOB with stairs and min guard to recover.                  Standardized Balance Assessment Standardized Balance Assessment : Dynamic Gait Index   Dynamic Gait Index Level Surface: Normal Change in Gait Speed: Normal Gait with Horizontal Head Turns: Mild Impairment Gait with Vertical Head Turns: Mild Impairment Gait and Pivot Turn: Normal Step Over Obstacle: Normal Step Around Obstacles: Mild Impairment Steps: Mild Impairment Total Score: 20      Cognition Arousal/Alertness: Awake/alert Behavior During Therapy: WFL for tasks assessed/performed Overall Cognitive Status: Impaired/Different from baseline Area of Impairment: Safety/judgement;Awareness;Problem solving                         Safety/Judgement: Decreased awareness of safety Awareness: Anticipatory Problem Solving: Slow processing;Requires verbal cues General Comments: A&Ox4. Pt anticipates need to change gait with obstacles, but pt quick with ascending and descending stairs even with some coordination deficits resulting in minor tripping on stairs, min guard to recover. Pt states it is due to blister on L heel.      Exercises      General Comments General comments (skin integrity, edema, etc.): Bruising, redness, and wounds greater on R side of body than L; Noted white boggy "blister" on L heel and "blister" on lateral plantar surface of R foot that had been present per pt PTA (RN notified); (-) R shoulder Lift-off test and infraspinatus test; (+) Drop arm sign test and pain with palpation to muscle belly and insertion site of supraspinatus muscle on R shoulder      Pertinent Vitals/Pain Pain Assessment: Faces Faces Pain Scale: Hurts even more Pain Location: R shoulder with flexion AROM and palpation; L heel with mobility Pain Descriptors / Indicators: Grimacing;Guarding Pain Intervention(s): Limited activity within patient's tolerance;Monitored during session;Repositioned    Home Living                      Prior Function             PT Goals (current goals can now be found in the care plan section) Acute Rehab PT Goals Patient Stated Goal: to improve his R shoulder PT Goal Formulation: With patient/family Time For Goal Achievement: 07/22/20 Potential to Achieve Goals: Good    Frequency    Min 3X/week      PT Plan Discharge plan needs to be updated    Co-evaluation              AM-PAC PT "6 Clicks" Mobility   Outcome Measure  Help needed turning from your back to your side while in a flat bed without using bedrails?: None Help needed moving from lying on your back to sitting on the side of a flat bed without using bedrails?: None Help needed moving to and from a bed to a chair (including a wheelchair)?: A Little Help needed standing up from a chair using your arms (e.g., wheelchair or bedside chair)?: A Little Help needed to walk in hospital room?: A Little Help needed climbing 3-5 steps with a railing? : A Little 6 Click Score: 20    End of Session Equipment Utilized During Treatment: Gait belt Activity Tolerance: Patient tolerated treatment well Patient left: with nursing/sitter in room (standing with sitter) Nurse Communication: Mobility status;Other (comment) (blisters on feet) PT Visit Diagnosis: Unsteadiness on feet (R26.81);Other abnormalities of gait and mobility (  R26.89);Ataxic gait (R26.0);Difficulty in walking, not elsewhere classified (R26.2)     Time: 4098-1191 PT Time Calculation (min) (ACUTE ONLY): 14 min  Charges:  $Gait Training: 8-22 mins                     Raymond Gurney, PT, DPT Acute Rehabilitation Services  Pager: [phone] Office: [phone]    Jewel Baize 07/09/2020, 3:36 PM

## 2020-07-09 NOTE — Consult Note (Signed)
[facility]patient [facility] Face-to-Face Psychiatry Consult   Reason for Consult: Suicide attempt Referring Physician:  Dr. Ella Jubilee Patient Identification: Ryan Faulkner MRN:  [id number] Principal Diagnosis: Intentional overdose of drug in tablet form [facility]) Diagnosis:  Principal Problem:   Intentional overdose of drug in tablet form (HCC) Active Problems:   Major depressive disorder, recurrent severe without psychotic features (HCC)   Alcohol use disorder, severe, dependence (HCC)   Essential hypertension   Tobacco dependence   Class 1 obesity due to excess calories with body mass index (BMI) of 30.0 to 30.9 in adult  Total Time spent with patient: 45 minutes  Subjective:   Ryan Faulkner is a 40 y.o. male patient admitted with suicide attempt.  Patient seen and evaluated by this provider.  He presented to the emergency department, after being found down at the hotel.  Patient endorses feelings of sadness, hopelessness, helplessness, and worthlessness " I do not have anything for myself.  It is a bunch of chaos.  I have been through multiple jobs, failed relationships, it is my father's 1 year anniversary of his death, homeless, and no car."  Patient reports his depression is worse with increase in alcohol use, which led to his most recent suicide attempt.  He states after being intoxicated, he decided to take between" 25-30 buspirone, and took 2 Seroquel tablets.  I took the Seroquel as prescribed.  And I just said fuck it."  When assessing for psychiatric history he reports a diagnosis of bipolar manic depression.  He reports he is receiving [facility] with [name], and receiving therapeutic services from [employer].  He reports numerous attempts at suicide " too many to count".  He reports 2 previous suicide attempts by overdose that resulted in medical intervention.  He reports 5-6 previous inpatient hospitalizations, with the last hospitalization being in August 2021 where he  was admitted inpatient in the [place].  He continues to endorse suicidal thoughts at this time.  Patient continues to have morbid depressive symptoms, with no support system, and remains hopeless we will continue to recommend inpatient admission at this time.  HPI: Ryan Faulkner a 39 y.o.malepast medical history significant for hypertension, depression/anxiety with history of suicidal ideation, alcohol dependence, tobacco dependence presented with excessive movements of extremitieswithconcern for seizures.Patient overdosed on unknown amount of Seroquel +/-BuSpar,found facedown at hotel room atapprox 0700.EMS gave Narcan w/nochange.Patient blood pressure has been running high since admission with systolic of 102-152 and diastolic of 80 to 120 mmHg.Patient is tachycardic with pulse rate ranging from 106-127/minute.He is afebrile and satting well at room air.CT head negative for acute intracranial pathology.Ethyl alcohol level is high at 96.AST andALT mildly elevated at 53 and 64 respectively.Acetaminophen level <10.QTc mildly elevated at 486. Pt has alcohol use disorder and was admitted for alcohol withdrawal recently.  Past Psychiatric History: See above Legal charges: Pending for assault by pointing a weapon, carrying concealed weapon with no license, carrying concealed weapon while intoxicated with no license, DWI, communicating threat, open container, and driving without taillight.   Risk to Self:  Yes Risk to Others:  No Prior Inpatient Therapy:  Multiple inpatient psychiatric admissions, also [facility] Prior Outpatient Therapy:  Currently receiving [facility] [place].  Past Medical History:  Past Medical History:  Diagnosis Date  . Alcohol dependence (HCC)   . 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 Hypertension   . Suicidal ideation  Past Surgical History:  Procedure Laterality Date  . TONSILLECTOMY      Family History: No family history on file. Family Psychiatric  History: As per patient father diagnosed with" paranoid delusion disorder."  Patient also reports family history maternal and paternal of depression. Social History:  Social History   Substance and Sexual Activity  Alcohol Use Yes  . Alcohol/week: 24.0 standard drinks  . Types: 24 Cans of beer per week     Social History   Substance and Sexual Activity  Drug Use No    Social History   Socioeconomic History  . Marital status: Single    Spouse name: Not on file  . Number of children: Not on file  . Years of education: Not on file  . Highest education level: Not on file  Occupational History  . Not on file  Tobacco Use  . Smoking status: Current Every Day Smoker  . Smokeless tobacco: Never Used  Substance and Sexual Activity  . Alcohol use: Yes    Alcohol/week: 24.0 standard drinks    Types: 24 Cans of beer per week  . Drug use: No  . Sexual activity: Not on file  Other Topics Concern  . Not on file  Social History Narrative  . Not on file   Social Determinants of Health   Financial Resource Strain: Not on file  Food Insecurity: Not on file  Transportation Needs: Not on file  Physical Activity: Not on file  Stress: Not on file  Social Connections: Not on file   Additional Social History:    Allergies:   Allergies  Allergen Reactions  . Bee Venom Anaphylaxis    Labs:  Results for orders placed or performed during the hospital encounter of 07/06/20 (from the past 48 hour(s))  Valproic acid level     Status: Abnormal   Collection Time: 07/07/20  4:30 PM  Result Value Ref Range   Valproic Acid Lvl <10 (L) 50.0 - 100.0 ug/mL    Comment: RESULTS CONFIRMED BY MANUAL DILUTION Performed at [facility], [address] [place], [place] [address]   Comprehensive metabolic panel     Status: Abnormal   Collection Time: 07/08/20  3:24 AM  Result Value Ref Range   Sodium 137 135 - 145 mmol/L    Potassium 3.3 (L) 3.5 - 5.1 mmol/L   Chloride 101 98 - 111 mmol/L   CO2 24 22 - 32 mmol/L   Glucose, Bld 96 70 - 99 mg/dL    Comment: Glucose reference range applies only to samples taken after fasting for at least 8 hours.   BUN <5 (L) 6 - 20 mg/dL   Creatinine, Ser 6.040.63 0.61 - 1.24 mg/dL   Calcium 8.9 8.9 - 54.010.3 mg/dL   Total Protein 6.1 (L) 6.5 - 8.1 g/dL   Albumin 3.3 (L) 3.5 - 5.0 g/dL   AST 65 (H) 15 - 41 U/L   ALT 69 (H) 0 - 44 U/L   Alkaline Phosphatase 64 38 - 126 U/L   Total Bilirubin 1.4 (H) 0.3 - 1.2 mg/dL   GFR, Estimated >98>60 >11>60 mL/min    Comment: (NOTE) Calculated using the CKD-EPI Creatinine Equation (2021)    Anion gap 12 5 - 15    Comment: Performed at [facility], [address] [place], [place] [address]  Basic metabolic panel     Status: Abnormal   Collection Time: 07/09/20  4:13 AM  Result Value Ref Range   Sodium  139 135 - 145 mmol/L   Potassium 3.9 3.5 - 5.1 mmol/L   Chloride 105 98 - 111 mmol/L   CO2 24 22 - 32 mmol/L   Glucose, Bld 110 (H) 70 - 99 mg/dL    Comment: Glucose reference range applies only to samples taken after fasting for at least 8 hours.   BUN 7 6 - 20 mg/dL   Creatinine, Ser 8.31 0.61 - 1.24 mg/dL   Calcium 9.2 8.9 - 51.7 mg/dL   GFR, Estimated >61 >60 mL/min    Comment: (NOTE) Calculated using the CKD-EPI Creatinine Equation (2021)    Anion gap 10 5 - 15    Comment: Performed at [facility], [address], [place], [place] [address]  Magnesium     Status: None   Collection Time: 07/09/20  4:13 AM  Result Value Ref Range   Magnesium 2.2 1.7 - 2.4 mg/dL    Comment: Performed at [facility], [address] [place], [place] [address]    Current Facility-Administered Medications  Medication Dose Route Frequency Provider Last Rate Last Admin  . acetaminophen (TYLENOL) tablet 650 mg  650 mg Oral Q6H PRN Jonah Blue, MD       Or  . acetaminophen (TYLENOL) suppository 650 mg  650 mg Rectal Q6H PRN  Jonah Blue, MD      . amLODipine (NORVASC) tablet 10 mg  10 mg Oral Daily Coralie Keens, MD   10 mg at 07/09/20 0956  . bisacodyl (DULCOLAX) EC tablet 5 mg  5 mg Oral Daily PRN Jonah Blue, MD      . dextrose 5 % in lactated ringers infusion   Intravenous Continuous Arrien, York Ram, MD 75 mL/hr at 07/08/20 2240 New Bag at 07/08/20 2240  . enoxaparin (LOVENOX) injection 40 mg  40 mg Subcutaneous Q24H Jonah Blue, MD   40 mg at 07/08/20 1704  . [START ON 07/10/2020] folic acid (FOLVITE) tablet 1 mg  1 mg Oral Daily Arrien, York Ram, MD      . hydrALAZINE (APRESOLINE) injection 5 mg  5 mg Intravenous Q4H PRN Jonah Blue, MD   5 mg at 07/07/20 2042  . lisinopril (ZESTRIL) tablet 10 mg  10 mg Oral Daily Arrien, York Ram, MD   10 mg at 07/09/20 0956  . LORazepam (ATIVAN) injection 0-4 mg  0-4 mg Intravenous Bobette Mo, MD      . LORazepam (ATIVAN) tablet 1-4 mg  1-4 mg Oral Q1H PRN Jonah Blue, MD       Or  . LORazepam (ATIVAN) injection 1-4 mg  1-4 mg Intravenous Q1H PRN Jonah Blue, MD   2 mg at 07/07/20 2254  . morphine 2 MG/ML injection 2 mg  2 mg Intravenous Q2H PRN Jonah Blue, MD   2 mg at 07/08/20 0158  . multivitamin with minerals tablet 1 tablet  1 tablet Oral Daily Jonah Blue, MD   1 tablet at 07/09/20 0956  . naproxen (NAPROSYN) tablet 500 mg  500 mg Oral BID WC Arrien, York Ram, MD   500 mg at 07/09/20 0956  . nicotine (NICODERM CQ - dosed in mg/24 hours) patch 14 mg  14 mg Transdermal q1800 Jonah Blue, MD   14 mg at 07/08/20 1705  . ondansetron (ZOFRAN) tablet 4 mg  4 mg Oral Q6H PRN Jonah Blue, MD       Or  . ondansetron [facility]) injection 4 mg  4 mg Intravenous Q6H PRN Jonah Blue, MD      .  polyethylene glycol (MIRALAX / GLYCOLAX) packet 17 g  17 g Oral Daily PRN Jonah Blue, MD      . sodium chloride flush (NS) 0.9 % injection 3 mL  3 mL Intravenous Q12H Jonah Blue, MD   3 mL at  07/09/20 7106    Musculoskeletal: Strength & Muscle Tone: decreased Gait & Station: normal Patient leans: N/A  Psychiatric Specialty Exam: Physical Exam Vitals and nursing note reviewed.  Constitutional:      Appearance: Normal appearance.  HENT:     Head: Normocephalic.     Nose: Nose normal.  Musculoskeletal:        General: Normal range of motion.     Cervical back: Normal range of motion.  Neurological:     General: No focal deficit present.     Mental Status: He is alert and oriented to person, place, and time.  Psychiatric:        Attention and Perception: Perception normal. He is inattentive.        Mood and Affect: Mood is anxious and depressed.        Speech: Speech normal.        Behavior: Behavior normal. Behavior is cooperative.        Thought Content: Thought content includes suicidal ideation. Thought content includes suicidal plan.        Cognition and Memory: Cognition and memory normal.        Judgment: Judgment normal.     Review of Systems  Psychiatric/Behavioral: Positive for dysphoric mood and suicidal ideas. The patient is nervous/anxious.   All other systems reviewed and are negative.   Blood pressure (!) 139/105, pulse 100, temperature 98.6 F (37 C), temperature source Oral, resp. rate 20, height 5\' 10"  (1.778 m), weight 95.3 kg, SpO2 98 %.Body mass index is 30.13 kg/m.  General Appearance: Fairly Groomed  Eye Contact:  Fair  Speech:  Clear and Coherent and Normal Rate  Volume:  Normal  Mood:  Depressed, " matter of fact"  Affect:  Blunt and Depressed  Thought Process:  Linear and Descriptions of Associations: Circumstantial  Orientation:  Full (Time, Place, and Person)  Thought Content:  Logical  Suicidal Thoughts:  Yes.  with intent/plan  Homicidal Thoughts:  No  Memory:  Immediate;   Fair Recent;   Fair Remote;   Fair  Judgement:  Poor  Insight:  Shallow  Psychomotor Activity:  Psychomotor Retardation  Concentration:  Concentration:  Fair and Attention Span: Fair  Recall:  of Knowledge:  Fair  Language:  Good  Akathisia:  No  Handed:  Right  AIMS (if indicated):     Assets:  Housing Leisure Time Physical Health Resilience  ADL's:  Intact  Cognition:  WNL  Sleep:        Treatment Plan Summary: Plan Continue current medications.  Recommend working closely with social work to facilitate inpatient psychiatric admission.  Patient is a [employer], may also check resources for inpatient at [facility].  -Continue CIWA detox protocol -Patient reports two pack per day smoking history.  Increase nicotine patch to 21 mg. -Continue IVC due to suicide attempt.  There continues to be ongoing safety concerns therefore he continues to meet criteria.   Alcohol use disorder, severe: -Ativan alcohol detox started  Disposition: Recommend psychiatric Inpatient admission when medically cleared.  [facility], FNP 07/09/2020 11:56 AM

## 2020-07-09 NOTE — TOC Initial Note (Signed)
Transition of Care [facility]) - Initial/Assessment Note    Patient Details  Name: Ryan Faulkner MRN: [id number] Date of Birth: 30-Aug-1980  [facility] of [facility]) CM/SW Contact:    Lockie Pares, RN Phone Number: 07/09/2020, 10:38 AM  Clinical Narrative:                 Day 2 admitted for intentional OD seroquel. Patient currently under IVC, states is homeless has been staying with friends. Has Full time work, drinks alcohol . When clear will be transferred to IP psychiatric services.PT and OT currently recommending home health, however patient does not have a stable place, if needed after IP psych, may look to outpatient therapy services.   Expected Discharge Plan: Psychiatric Hospital Barriers to Discharge: Continued Medical Work up   Patient Goals and CMS Choice        Expected Discharge Plan and Services Expected Discharge Plan: Psychiatric Hospital In-house Referral: Clinical Social Work Discharge Planning Services: CM Consult   Living arrangements for the past 2 months: No permanent address,Homeless                                      Prior Living Arrangements/Services Living arrangements for the past 2 months: No permanent address,Homeless Lives with:: Self Patient language and need for interpreter reviewed:: Yes        Need for Family Participation in Patient Care: Yes (Comment) Care giver support system in place?: Yes (comment)   Criminal Activity/Legal Involvement Pertinent to Current Situation/Hospitalization: No - Comment as needed  Activities of Daily Living      Permission Sought/Granted                  Emotional Assessment       Orientation: : Oriented to Self,Oriented to Place,Oriented to Situation Alcohol / Substance Use: Alcohol Use Psych Involvement: No (comment)  Admission diagnosis:  Drug overdose, undetermined intent, initial encounter [T50.904A] Intentional overdose of drug in tablet form (HCC) [T50.902A] Patient  Active Problem List   Diagnosis Date Noted  . Intentional overdose of drug in tablet form (HCC) 07/06/2020  . Essential hypertension 07/06/2020  . Tobacco dependence 07/06/2020  . Class 1 obesity due to excess calories with body mass index (BMI) of 30.0 to 30.9 in adult 07/06/2020  . 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 Alcohol use disorder, severe, dependence (HCC) 02/15/2020  . Delirium tremens (HCC) 12/16/2018   PCP:  Patient, No Pcp Per Pharmacy:   [id number] Nicholes Rough, [place] - [address] AT [facility] OF [address] [place] [address] Phone: [phone] Fax: [phone]     Social Determinants of Health (SDOH) Interventions    Readmission Risk Interventions No flowsheet data found.

## 2020-07-10 MED ORDER — LIDOCAINE 5 % EX PTCH
1.0000 | MEDICATED_PATCH | CUTANEOUS | Status: DC
  Administered 2020-07-10 – 2020-07-14 (×5): 1 via TRANSDERMAL
  Filled 2020-07-10 (×5): qty 1

## 2020-07-10 NOTE — Progress Notes (Signed)
Occupational Therapy Treatment Patient Details Name: Ryan Faulkner MRN: [id number] DOB: 08/26/1980 Today's Date: 07/10/2020    History of present illness Pt is a 40 y.o. male with a PMH of suicidal ideation, HTN, and alcohol dependence who presents to the hospital 2/2 intentional overdose of Seroquel 6000 mg. EtOH present on blood work. Per chart, pt is homeless and had told family and friends goodbye prior to this event. Pt with acute toxic metabolic encephalpathy in setting of OD. EEG negative for seizures. CT negative for acute intracranial abnormalities.   OT comments  Pt. Was ed on HEP for R SHLD flex/ext, ABD/ADD. Pt. Was able to perform srom with s and cues for technique. Pt. Was ed on UE/LE ADLs to increase I and was S with tasks. Pt. To be followed by Acute OT to maximize function prior to dc.   Follow Up Recommendations  Outpatient OT;Supervision - Intermittent    Equipment Recommendations  None recommended by OT    Recommendations for Other Services      Precautions / Restrictions Precautions Precautions: Fall Precaution Comments: 1:1 sitter, suicide risk, flight risk Restrictions Weight Bearing Restrictions: No       Mobility Bed Mobility       Sidelying to sit: Modified independent (Device/Increase time) Supine to sit: Modified independent (Device/Increase time) Sit to supine: Modified independent (Device/Increase time)      Transfers Overall transfer level: Needs assistance   Transfers: Sit to/from Stand Sit to Stand: Supervision              Balance     Sitting balance-Leahy Scale: Good       Standing balance-Leahy Scale: Good                             ADL either performed or assessed with clinical judgement   ADL Overall ADL's : Needs assistance/impaired     Grooming: Standing;Supervision/safety;Wash/dry hands;Wash/dry face           Upper Body Dressing : Supervision/safety;Standing (simulated to perform donning  pullover shirt and front opening shirt wtih gown)   Lower Body Dressing: Supervision/safety;Sit to/from stand   Toilet Transfer: Supervision/safety;Comfort height toilet;Ambulation           Functional mobility during ADLs: Supervision/safety General ADL Comments: S with ADL tasks.     Vision   Vision Assessment?: No apparent visual deficits   Perception     Praxis      Cognition Arousal/Alertness: Awake/alert Behavior During Therapy: WFL for tasks assessed/performed                                            Exercises     Shoulder Instructions       General Comments      Pertinent Vitals/ Pain       Pain Assessment: No/denies pain  Home Living                                          Prior Functioning/Environment              Frequency  Min 2X/week        Progress Toward Goals  OT Goals(current goals can now be found in the care plan  section)  Progress towards OT goals: Progressing toward goals  Acute Rehab OT Goals Patient Stated Goal: to improve his R shoulder OT Goal Formulation: With patient Time For Goal Achievement: 07/23/20 Potential to Achieve Goals: Good ADL Goals Additional ADL Goal #1: Patient will complete A.M. ADLs with Mod I and AE PRN demonstrating good safety awareness. Additional ADL Goal #2: Patient will complete RUE HEP x2 daily to improve AROM and strength in prep for return to work. Additional ADL Goal #3: Patient will recall 3 stress management technqiues in prep for safe d/c.  Plan Discharge plan remains appropriate    Co-evaluation                 AM-PAC OT "6 Clicks" Daily Activity     Outcome Measure   Help from another person eating meals?: None Help from another person taking care of personal grooming?: A Little Help from another person toileting, which includes using toliet, bedpan, or urinal?: A Little Help from another person bathing (including washing, rinsing,  drying)?: A Little Help from another person to put on and taking off regular upper body clothing?: A Little Help from another person to put on and taking off regular lower body clothing?: A Little 6 Click Score: 19    End of Session    OT Visit Diagnosis: Muscle weakness (generalized) (M62.81);Pain   Activity Tolerance Patient tolerated treatment well   Patient Left in bed;with call bell/phone within reach;with nursing/sitter in room   Nurse Communication  (ok therapy)        Time: 6546-5035 OT Time Calculation (min): 30 min  Charges: OT General Charges $OT Visit: 1 Visit OT Treatments $Self Care/Home Management : 8-22 mins $Therapeutic Exercise: 8-22 mins  Derrek Gu OT/L      Stephenia Vogan 07/10/2020, 10:46 AM

## 2020-07-10 NOTE — Progress Notes (Signed)
PROGRESS NOTE    Ryan Faulkner  JAS:[id number] DOB: 02-25-1981 DOA: 07/06/2020 PCP: Patient, No Pcp Per    Brief Narrative:  Mr. Ryan Faulkner admitted to the hospital with a working diagnosis of intentional overdose with quetiapine/ buspirone,complicated withextrapyramidal andanticholinergicsyndrome.  40 year old male with past medical history for hypertension, depression, anxiety, alcohol abuse and tobacco dependence who presents after an intentional overdose. He attempted suicide via overdose on 01/09,he called his mother to say goodbye. The following day he was delusional and disoriented. 01/11he was found down at the hotel room around 7 in the morning, apparently overdosed with unknown amount of quetiapine +/-buspiron he he received naloxone by EMS with no significant change. He was brought to the hospital for further evaluation. On his initial physical examination blood pressure 123/112, heart rate 123, respirate 16, oxygen saturation 99%, lungs clear to auscultation bilaterally, heart S1-S2, present, tachycardic, abdomen soft, no lower extremity edema.  Chest radiograph with bibasilar atelectasis, no frank infiltrates.   EKG 120 bpm, normal axis, normal intervals, sinus rhythm, no st segment or T wave changes.  Patient placed under supportive medical care with slow improvement of his symptoms.  Initially required 4 point restrains.   Psychiatry consulted and recommendations for inpatient psych. Patient is involuntary committed due to high suicidal risk.   Patient is now medically stable for transfer.   Assessment & Plan:   Principal Problem:   Intentional overdose of drug in tablet form (HCC) Active Problems:   Major depressive disorder, recurrent severe without psychotic features (HCC)   Alcohol use disorder, severe, dependence (HCC)   Essential hypertension   Tobacco dependence   Class 1 obesity due to excess calories with body mass index (BMI) of 30.0 to 30.9  in adult   1. Suicidal attempt, quetiapine and buspirone overdose, complicated with extrapyramidal syndrome and anticholinergic syndrome.EEG with no seizures.  Brain MRI with no acute changes, questionable changes in T2 signal in the medial thalami to hypothalamic region, can indicate Wernicke encephalopathy.   Patient at home on divalproex, buspirone, escitalopramand quetiapine.   Medically patient is stable, mild tremors bilateral hands, but no anxiety or agitation, no confusion. Continue with suicidal precautions, one to one sitter. Psychiatry has recommended inpatient psych, patient continue IVC.   2. HTN.Continue with amlodipine and lisinopril for blood pressure control  3. Tobacco and alcohol dependence. No clinical signs of active withdrawal, will continue with as needed oral lorazepam. Continue with multivitamins and thiamine.  4. Hypomagnesemia/ hypokalemia.resolved, patient is tolerating po well.   5. Obesity type 1/ chronic pain/ right shoulder pain, suspected rotator cuff tear. Calculated BMI is 30,1. Pain control with naproxen, acetaminophen and will add lidocaine patch. Not significant pain control with diclofenac, that will be discontinued/  Discontinue IV morphine.    Status is: Inpatient  Remains inpatient appropriate because:Unsafe d/c plan   Dispo: The patient is from: Home              Anticipated d/c is to: Inpatient pscyhiatry               Anticipated d/c date is: 1 day              Patient currently is medically stable to d/c.   DVT prophylaxis: Enoxaparin   Code Status:   full  Family Communication:  No family at the bedside       Consultants:   Neurology   Psychiatry    Subjective: Patient is feeling better, continue to have right shoulder pain,  no nausea or vomiting, no dyspnea or chest pain.   Objective: Vitals:   07/09/20 1608 07/09/20 2012 07/10/20 0408 07/10/20 0801  BP: (!) 139/99 (!) 160/110 (!) 144/101 (!) 144/106   Pulse: (!) 102 99 87 92  Resp: 18 18 18 18   Temp: 98 F (36.7 C) 98.5 F (36.9 C) 98.4 F (36.9 C) 97.9 F (36.6 C)  TempSrc: Oral Oral Oral Oral  SpO2: 98% 100% 98% 100%  Weight:      Height:        Intake/Output Summary (Last 24 hours) at 07/10/2020 0857 Last data filed at 07/10/2020 07/12/2020 Gross per 24 hour  Intake 760 ml  Output -  Net 760 ml   Filed Weights   07/06/20 1259  Weight: 95.3 kg    Examination:   General: deconditioned  Neurology: Awake and alert, non focal  E ENT: no pallor, no icterus, oral mucosa moist Cardiovascular: No JVD. S1-S2 present, rhythmic, no gallops, rubs, or murmurs. No lower extremity edema. Pulmonary: vesicular breath sounds bilaterally, adequate air movement, no wheezing, rhonchi or rales. Gastrointestinal. Abdomen soft and non tender Skin. No rashes Musculoskeletal: positive right shoulder pain.      Data Reviewed: I have personally reviewed following labs and imaging studies  CBC: Recent Labs  Lab 07/06/20 1307 07/06/20 1438 07/07/20 0500  WBC 9.5  --  9.9  NEUTROABS 6.6  --   --   HGB 14.8 15.0 13.5  HCT 44.3 44.0 40.4  MCV 93.9  --  94.4  PLT 166  --  169   Basic Metabolic Panel: Recent Labs  Lab 07/06/20 1307 07/06/20 1438 07/07/20 0500 07/07/20 0533 07/08/20 0324 07/09/20 0413  NA 135 135 138  --  137 139  K 3.8 3.6 4.0  --  3.3* 3.9  CL 98  --  106  --  101 105  CO2 23  --  22  --  24 24  GLUCOSE 144*  --  93  --  96 110*  BUN 6  --  <5*  --  <5* 7  CREATININE 0.72  --  0.75  --  0.63 0.82  CALCIUM 9.2  --  8.4*  --  8.9 9.2  MG  --   --   --  1.7  --  2.2   GFR: Estimated Creatinine Clearance: 140.1 mL/min (by C-G formula based on SCr of 0.82 mg/dL). Liver Function Tests: Recent Labs  Lab 07/06/20 1307 07/08/20 0324  AST 70* 65*  ALT 70* 69*  ALKPHOS 67 64  BILITOT 1.2 1.4*  PROT 6.9 6.1*  ALBUMIN 3.8 3.3*   No results for input(s): LIPASE, AMYLASE in the last 168 hours. No results for  input(s): AMMONIA in the last 168 hours. Coagulation Profile: No results for input(s): INR, PROTIME in the last 168 hours. Cardiac Enzymes: Recent Labs  Lab 07/07/20 0533  CKTOTAL 804*   BNP (last 3 results) No results for input(s): PROBNP in the last 8760 hours. HbA1C: No results for input(s): HGBA1C in the last 72 hours. CBG: Recent Labs  Lab 07/06/20 1304  GLUCAP 138*   Lipid Profile: No results for input(s): CHOL, HDL, LDLCALC, TRIG, CHOLHDL, LDLDIRECT in the last 72 hours. Thyroid Function Tests: No results for input(s): TSH, T4TOTAL, FREET4, T3FREE, THYROIDAB in the last 72 hours. Anemia Panel: No results for input(s): VITAMINB12, FOLATE, FERRITIN, TIBC, IRON, RETICCTPCT in the last 72 hours.    Radiology Studies: I have reviewed all of the imaging during  this hospital visit personally     Scheduled Meds: . amLODipine  10 mg Oral Daily  . diclofenac Sodium  4 g Topical QID  . enoxaparin (LOVENOX) injection  40 mg Subcutaneous Q24H  . folic acid  1 mg Oral Daily  . lisinopril  10 mg Oral Daily  . LORazepam  0-4 mg Intravenous Q8H  . multivitamin with minerals  1 tablet Oral Daily  . naproxen  500 mg Oral BID WC  . nicotine  21 mg Transdermal q1800  . sodium chloride flush  3 mL Intravenous Q12H  . thiamine  100 mg Oral Daily   Continuous Infusions:   LOS: 4 days        Ryan Elza Annett Gula, MD

## 2020-07-11 NOTE — Progress Notes (Signed)
PROGRESS NOTE    Ryan Faulkner  KGY:[id number] DOB: January 15, 1981 DOA: 07/06/2020 PCP: Patient, No Pcp Per    Brief Narrative:  Ryan Faulkner admitted to the hospital with a working diagnosis of intentional overdose with quetiapine/ buspirone,complicated withextrapyramidal andanticholinergicsyndrome.  40 year old male with past medical history for hypertension, depression, anxiety, alcohol abuse and tobacco dependence who presents after an intentional overdose. He attempted suicide via overdose on 01/09,he called his mother to say goodbye. The following day he was delusional and disoriented. 01/11he was found down at the hotel room around 7 in the morning, apparently overdosed with unknown amount of quetiapine +/-buspiron he he received naloxone by EMS with no significant change. He was brought to the hospital for further evaluation. On his initial physical examination blood pressure 123/112, heart rate 123, respirate 16, oxygen saturation 99%, lungs clear to auscultation bilaterally, heart S1-S2, present, tachycardic, abdomen soft, no lower extremity edema.  Chest radiograph with bibasilar atelectasis, no frank infiltrates.   EKG 120 bpm, normal axis, normal intervals, sinus rhythm, no st segment or T wave changes.  Patient placed under supportive medical care with slow improvement of his symptoms.  Initially required 4 point restrains.   Psychiatry consulted and recommendations for inpatient psych. Patient is involuntary committed due to high suicidal risk.  Patient is now medically stable for transfer.  He has been calm and cooperative.    Assessment & Plan:   Principal Problem:   Intentional overdose of drug in tablet form (HCC) Active Problems:   Major depressive disorder, recurrent severe without psychotic features (HCC)   Alcohol use disorder, severe, dependence (HCC)   Essential hypertension   Tobacco dependence   Class 1 obesity due to excess calories  with body mass index (BMI) of 30.0 to 30.9 in adult   1. Suicidal attempt, quetiapine and buspirone overdose, complicated with extrapyramidal syndrome and anticholinergic syndrome.EEG with no seizures. Brain MRI with no acute changes, questionable changes in T2 signal in the medial thalami to hypothalamic region, can indicate Wernicke encephalopathy.  Patient at home ondivalproex, buspirone, escitalopramand quetiapine.  Continue medically stable for discharge to inpatient psych, he is more calm and cooperative. Will allow him for shower per his request.  Continue with suicidal precautions, one to one sitter. Psychiatry has recommended inpatient psych, patient continue IVC.   2. HTN.On amlodipine and lisinopril for blood pressure control  3. Tobacco and alcohol dependence. No current clinical signs of active withdrawal, on as needed lorazepam. On multivitamins and thiamine.  4. Hypomagnesemia/ hypokalemia.resolved, patient is tolerating po well.   5. Obesity type 1/ chronic pain/ right shoulder pain, suspected rotator cuff tear. Calculated BMI is 30,1. Analgesia with naproxen, acetaminophen and lidocaine patch. Will need outpatient follow up with orthopedics.    Status is: Inpatient  Remains inpatient appropriate because:Unsafe d/c plan   Dispo: The patient is from: Home              Anticipated d/c is to: Inpatient psych              Anticipated d/c date is: 1 day              Patient currently is medically stable to d/c.    DVT prophylaxis: Enoxaparin   Code Status:   full  Family Communication:  No family at the bedside, her mother has been updated by me during this hospitalization about plans for inpatient psychiatry, all questions were addressed.     Consultants:   Psychiatry  Subjective: Patient is awake and alert, he is calm and cooperative not agitated. Continue with one to one sitter.   Objective: Vitals:   07/10/20 1955 07/10/20 2333  07/11/20 0339 07/11/20 0914  BP: (!) 142/106 (!) 142/102 (!) 137/98 (!) 132/97  Pulse: 86 80 73 88  Resp: 18 20 18 16   Temp: 98.1 F (36.7 C) 98.6 F (37 C) 98.3 F (36.8 C) 98.2 F (36.8 C)  TempSrc: Oral Oral Oral Oral  SpO2: 100% 98% 99% 99%  Weight:      Height:        Intake/Output Summary (Last 24 hours) at 07/11/2020 0935 Last data filed at 07/10/2020 1500 Gross per 24 hour  Intake 720 ml  Output -  Net 720 ml   Filed Weights   07/06/20 1259  Weight: 95.3 kg    Examination:   General: Not in pain or dyspnea.  Neurology: Awake and alert, non focal  E ENT: no pallor, no icterus, oral mucosa moist Cardiovascular: No JVD. S1-S2 present, rhythmic, no gallops, rubs, or murmurs. No lower extremity edema. Pulmonary: positive breath sounds bilaterally, adequate air movement, no wheezing, rhonchi or rales. Gastrointestinal. Abdomen soft Skin. No rashes Musculoskeletal: no joint deformities     Data Reviewed: I have personally reviewed following labs and imaging studies  CBC: Recent Labs  Lab 07/06/20 1307 07/06/20 1438 07/07/20 0500  WBC 9.5  --  9.9  NEUTROABS 6.6  --   --   HGB 14.8 15.0 13.5  HCT 44.3 44.0 40.4  MCV 93.9  --  94.4  PLT 166  --  169   Basic Metabolic Panel: Recent Labs  Lab 07/06/20 1307 07/06/20 1438 07/07/20 0500 07/07/20 0533 07/08/20 0324 07/09/20 0413  NA 135 135 138  --  137 139  K 3.8 3.6 4.0  --  3.3* 3.9  CL 98  --  106  --  101 105  CO2 23  --  22  --  24 24  GLUCOSE 144*  --  93  --  96 110*  BUN 6  --  <5*  --  <5* 7  CREATININE 0.72  --  0.75  --  0.63 0.82  CALCIUM 9.2  --  8.4*  --  8.9 9.2  MG  --   --   --  1.7  --  2.2   GFR: Estimated Creatinine Clearance: 140.1 mL/min (by C-G formula based on SCr of 0.82 mg/dL). Liver Function Tests: Recent Labs  Lab 07/06/20 1307 07/08/20 0324  AST 70* 65*  ALT 70* 69*  ALKPHOS 67 64  BILITOT 1.2 1.4*  PROT 6.9 6.1*  ALBUMIN 3.8 3.3*   No results for input(s):  LIPASE, AMYLASE in the last 168 hours. No results for input(s): AMMONIA in the last 168 hours. Coagulation Profile: No results for input(s): INR, PROTIME in the last 168 hours. Cardiac Enzymes: Recent Labs  Lab 07/07/20 0533  CKTOTAL 804*   BNP (last 3 results) No results for input(s): PROBNP in the last 8760 hours. HbA1C: No results for input(s): HGBA1C in the last 72 hours. CBG: Recent Labs  Lab 07/06/20 1304  GLUCAP 138*   Lipid Profile: No results for input(s): CHOL, HDL, LDLCALC, TRIG, CHOLHDL, LDLDIRECT in the last 72 hours. Thyroid Function Tests: No results for input(s): TSH, T4TOTAL, FREET4, T3FREE, THYROIDAB in the last 72 hours. Anemia Panel: No results for input(s): VITAMINB12, FOLATE, FERRITIN, TIBC, IRON, RETICCTPCT in the last 72 hours.    Radiology Studies:  I have reviewed all of the imaging during this hospital visit personally     Scheduled Meds: . amLODipine  10 mg Oral Daily  . enoxaparin (LOVENOX) injection  40 mg Subcutaneous Q24H  . folic acid  1 mg Oral Daily  . lidocaine  1 patch Transdermal Q24H  . lisinopril  10 mg Oral Daily  . multivitamin with minerals  1 tablet Oral Daily  . naproxen  500 mg Oral BID WC  . nicotine  21 mg Transdermal q1800  . sodium chloride flush  3 mL Intravenous Q12H  . thiamine  100 mg Oral Daily   Continuous Infusions:   LOS: 5 days        Mitsugi Schrader Annett Gula, MD

## 2020-07-12 MED ORDER — ACETAMINOPHEN 325 MG PO TABS: 650.0000 mg | ORAL_TABLET | Freq: Four times a day (QID) | ORAL | Status: DC | PRN

## 2020-07-12 MED ORDER — THIAMINE HCL 100 MG PO TABS
100.0000 mg | ORAL_TABLET | Freq: Every day | ORAL | 0 refills | Status: DC
Start: 2020-07-13 — End: 2020-07-15

## 2020-07-12 NOTE — Progress Notes (Signed)
Patient decline tele-psych visit, prefers in person evaluation. Will attempt to reassess in person tomorrow. Patient continues to meet inpatient psychiatric evaluation. He remains high risk for suicide completion.

## 2020-07-12 NOTE — Discharge Summary (Signed)
Physician Discharge Summary  EMIR NACK FYB:[id number] DOB: 04-26-1981 DOA: 07/06/2020  PCP: Patient, No Pcp Per  Admit date: 07/06/2020 Discharge date: 07/12/2020  Admitted From: Home  Disposition:  Inpatient psychiatry   Recommendations for Outpatient Follow-up and new medication changes:  1. Follow up with Primary Care in 4 weeks.   Home Health: no   Equipment/Devices: no    Discharge Condition: stable  CODE STATUS: full  Diet recommendation: regular   Brief/Interim Summary: Mr. Isa admitted to the hospital with a working diagnosis of intentional overdose with quetiapine/ buspirone,complicated withextrapyramidal andanticholinergicsyndrome.  40 year old male with past medical history for hypertension, depression, anxiety, alcohol abuse and tobacco dependence who presents after an intentional overdose. He attempted suicide via overdose on 01/09,he called his mother to say goodbye. The following day he was delusional and disoriented. 01/11he was found down at the hotel room around 7 in the morning, apparently overdosed with unknown amount of quetiapine +/-buspirone. He received naloxone by EMS with no significant change. He was brought to the hospital for further evaluation. On his initial physical examination blood pressure 123/112, heart rate 123, respiratory rate 16, oxygen saturation 99%, lungs clear to auscultation bilaterally, heart S1-S2, present, tachycardic, abdomen soft, no lower extremity edema.  Sodium 135, potassium 3.8, chloride 98, bicarb 23, glucose 144, BUN 6, creatinine 0.72, white count 9.5, hemoglobin 14.8, hematocrit 44.3, platelets 166.  SARS COVID-19 negative.  Chest radiograph with bibasilar atelectasis, no frank infiltrates.   EKG 120 bpm, normal axis, normal intervals, sinus rhythm, no st segment or T wave changes.  Patient placed under supportive medical care with slow improvement of his symptoms.  Initially required 4 point restrains.    Psychiatry consulted and recommendations for inpatient psych. Patient is involuntary committed due to high suicidal risk.  Patient is now medically stable for transfer.  He has been calm and cooperative.   1. Suicidal attempt, quetiapine and buspirone overdose, complicated with extrapyramidal syndrome and anticholinergic syndrome.  Patient was admitted to the progressive care unit, he had frequent neurochecks and supportive medical care. Further work-up with head electroencephalography showed no seizures, brain MRI with no acute changes, questionable T2 signal changes in the medial thalami to the hypothalamic region which can indicate Wernicke's encephalopathy.  Patient was placed on thiamine and multivitamins. Initially very agitated in four-point restraints, slowly symptoms improved, at the time of discharge he has no further tremors, rigidity, decreased balance or dry mouth.  Patient was seen by psychiatry with recommendations to transfer to psychiatric hospital. Patient was involuntarily committed due to high risk of suicidal.  2. Hypertension.  Patient was resumed on amlodipine and lisinopril.  3. Tobacco and alcohol abuse.  Patient received lorazepam per CIWA protocol, nicotine patch for nicotine replacement. At the time of discharge no signs of acute withdrawal.  4. Hypomagnesemia/hypokalemia.  Patient received potassium chloride and magnesium sulfate, with correction of electrolytes.  His kidney function remained stable.  At discharge sodium 139, potassium 3.9, chloride 105, bicarb 24, glucose 110, BUN 7, creatinine 0.82, magnesium 2.2.  5. obesity type I, chronic pain syndrome, right shoulder pain, suspected right rotator cuff tear.  Calculated BMI 30.1, patient received analgesia with naproxen, acetaminophen and lidocaine patch. He received physical therapy and Occupational Therapy. Patient will need outpatient orthopedics for further rotator cuff tear treatment.    Discharge Diagnoses:  Principal Problem:   Intentional overdose of drug in tablet form (HCC) Active Problems:   Major depressive disorder, recurrent severe without psychotic features (HCC)  Alcohol use disorder, severe, dependence (HCC)   Essential hypertension   Tobacco dependence   Class 1 obesity due to excess calories with body mass index (BMI) of 30.0 to 30.9 in adult    Discharge Instructions   Allergies as of 07/12/2020      Reactions   Bee Venom Anaphylaxis      Medication List    STOP taking these medications   busPIRone 10 MG tablet Commonly known as: BUSPAR   divalproex 500 MG 24 hr tablet Commonly known as: DEPAKOTE ER   escitalopram 20 MG tablet Commonly known as: LEXAPRO   QUEtiapine 200 MG tablet Commonly known as: SEROQUEL     TAKE these medications   acetaminophen 325 MG tablet Commonly known as: TYLENOL Take 2 tablets (650 mg total) by mouth every 6 (six) hours as needed for mild pain or moderate pain.   amLODipine 10 MG tablet Commonly known as: NORVASC Take 10 mg by mouth daily.   EPINEPHrine 0.3 mg/0.3 mL Soaj injection Commonly known as: EPI-PEN Inject 0.3 mg into the muscle as needed for anaphylaxis.   lisinopril 10 MG tablet Commonly known as: ZESTRIL Take 1 tablet by mouth daily.   naproxen 500 MG tablet Commonly known as: Naprosyn Take 1 tablet (500 mg total) by mouth 2 (two) times daily with a meal.   thiamine 100 MG tablet Take 1 tablet (100 mg total) by mouth daily. Start taking on: July 13, 2020       Allergies  Allergen Reactions  . Bee Venom Anaphylaxis    Consultations:  Psychiatry    Procedures/Studies: DG Shoulder Right  Result Date: 07/09/2020 CLINICAL DATA:  Right shoulder pain and limited range of motion. Unsure of injury. Admitted for drug overdose. EXAM: RIGHT SHOULDER - 2+ VIEW COMPARISON:  None. FINDINGS: There is no evidence of fracture or dislocation. There is no evidence of arthropathy or  other focal bone abnormality. Soft tissues are unremarkable. IMPRESSION: Negative. Electronically Signed   By: Obie Dredge M.D.   On: 07/09/2020 11:00   CT Head Wo Contrast  Result Date: 07/06/2020 CLINICAL DATA:  40 year old male found unresponsive, cerebral hemorrhage suspected. EXAM: CT HEAD WITHOUT CONTRAST TECHNIQUE: Contiguous axial images were obtained from the base of the skull through the vertex without intravenous contrast. COMPARISON:  05/03/2012 FINDINGS: Brain: No evidence of acute infarction, hemorrhage, hydrocephalus, extra-axial collection or mass lesion/mass effect. Vascular: No hyperdense vessel or unexpected calcification. Skull: Normal. Negative for fracture or focal lesion. Sinuses/Orbits: No acute finding. Other: None. IMPRESSION: No acute intracranial abnormality. Electronically Signed   By: Marliss Coots MD   On: 07/06/2020 14:30   MR BRAIN W WO CONTRAST  Result Date: 07/09/2020 CLINICAL DATA:  Brain mass or lesion. Alcohol dependence. Recent intentional overdose. EXAM: MRI HEAD WITHOUT AND WITH CONTRAST TECHNIQUE: Multiplanar, multiecho pulse sequences of the brain and surrounding structures were obtained without and with intravenous contrast. CONTRAST:  9mL GADAVIST GADOBUTROL 1 MMOL/ML IV SOLN COMPARISON:  None. FINDINGS: Brain: The brainstem and cerebellum are normal. One could question low level T2 signal in the medial thalami 2 hypothalamic region which, in this clinical setting, could indicate a degree of Wernicke encephalopathy. No evidence of focal infarction, mass, hemorrhage, hydrocephalus or extra-axial collection. After contrast administration, no abnormal enhancement occurs. Vascular: Major vessels at the base of the brain show flow. Skull and upper cervical spine: Negative Sinuses/Orbits: Right maxillary sinusitis with air-fluid level. Scattered opacified right ethmoid air cells. Orbits negative. Other: None IMPRESSION: 1. One  could question low level T2 signal in  the medial thalami to hypothalamic region which, in this clinical setting, could indicate a degree of Wernicke encephalopathy. Consider thiamine administration. 2. Right maxillary sinusitis. Electronically Signed   By: Paulina Fusi M.D.   On: 07/09/2020 14:14   DG Chest Port 1 View  Result Date: 07/06/2020 CLINICAL DATA:  Cough. EXAM: PORTABLE CHEST 1 VIEW COMPARISON:  08/14/2017. FINDINGS: Mediastinum hilar structures normal. Low lung volumes with bibasilar atelectasis/infiltrates. No pleural effusion or pneumothorax. Stable cardiomegaly. No pulmonary venous congestion. Thoracic spine scoliosis. IMPRESSION: Low lung volumes with bibasilar atelectasis/infiltrates. Electronically Signed   By: Maisie Fus  Register   On: 07/06/2020 13:25   EEG adult  Result Date: 07/07/2020 Charlsie Quest, MD     07/08/2020  8:52 AM Patient Name: BROUGHTON EPPINGER MRN: [id number] Epilepsy Attending: Charlsie Quest Referring Physician/Provider: Dr. Jonah Blue Date: 07/06/2020 Duration: 23.59 mins Patient history: 40 year old male with concern for seizures in the setting of intentional overdose with Seroquel. EEG to evaluate for seizures. Level of alertness: Awake AEDs during EEG study: Lorazepam Technical aspects: This EEG study was done with scalp electrodes positioned according to the 10-20 International system of electrode placement. Electrical activity was acquired at a sampling rate of 500Hz  and reviewed with a high frequency filter of 70Hz  and a low frequency filter of 1Hz . EEG data were recorded continuously and digitally stored. Description: The posterior dominant rhythm consists of 9 Hz activity of moderate voltage (25-35 uV) seen predominantly in posterior head regions, symmetric and reactive to eye opening and eye closing. At around 1800, patient was noted to have an episode where patient's left arm was extended and abducted while he was laying in the bed and suddenly had a jerking movement with elevation of left arm  as well as brief generalized whole body twitching. He then continued to have nonrhythmic whole-body movements with eyes closed. Concomitant EEG before, during and after the event did not show any EEG to suggest seizure. Hyperventilation and photic stimulation were not performed.   IMPRESSION: This study is within normal limits. No seizures or epileptiform discharges were seen throughout the recording. At around 1800, patient was noted to have an episode of jerking as described above without concomitant EEG change and was not epileptic. Priyanka        Subjective: Patient is feeling better, no tremors, no nausea or vomiting, no chest pain or dyspnea.   Discharge Exam: Vitals:   07/12/20 0743 07/12/20 1237  BP: 115/83 (!) 136/102  Pulse: 78 72  Resp: 18 20  Temp: 98.1 F (36.7 C) 98.1 F (36.7 C)  SpO2: 100% 99%   Vitals:   07/12/20 0014 07/12/20 0347 07/12/20 0743 07/12/20 1237  BP: (!) 121/91 123/90 115/83 (!) 136/102  Pulse: 71 70 78 72  Resp: 19 20 18 20   Temp: 98.2 F (36.8 C) 98.1 F (36.7 C) 98.1 F (36.7 C) 98.1 F (36.7 C)  TempSrc: Oral Oral Oral Oral  SpO2: 98% 99% 100% 99%  Weight:      Height:        General: Not in pain or dyspnea .  Neurology: Awake and alert, non focal  E ENT: no pallor, no icterus, oral mucosa moist Cardiovascular: No JVD. S1-S2 present, rhythmic, no gallops, rubs, or murmurs. No lower extremity edema. Pulmonary: positive breath sounds bilaterally Gastrointestinal. Abdomen soft and non tender Skin. No rashes Musculoskeletal: no joint deformities   The results of significant diagnostics from this  hospitalization (including imaging, microbiology, ancillary and laboratory) are listed below for reference.     Microbiology: Recent Results (from the past 240 hour(s))  Resp Panel by RT-PCR (Flu A&B, Covid) Nasopharyngeal Swab     Status: None   Collection Time: 07/06/20  1:40 PM   Specimen: Nasopharyngeal Swab; Nasopharyngeal(NP)  swabs in vial transport medium  Result Value Ref Range Status   SARS Coronavirus 2 by RT PCR NEGATIVE NEGATIVE Final    Comment: (NOTE) SARS-CoV-2 target nucleic acids are NOT DETECTED.  The SARS-CoV-2 RNA is generally detectable in upper respiratory specimens during the acute phase of infection. The lowest concentration of SARS-CoV-2 viral copies this assay can detect is 138 copies/mL. A negative result does not preclude SARS-Cov-2 infection and should not be used as the sole basis for treatment or other patient management decisions. A negative result may occur with  improper specimen collection/handling, submission of specimen other than nasopharyngeal swab, presence of viral mutation(s) within the areas targeted by this assay, and inadequate number of viral copies(<138 copies/mL). A negative result must be combined with clinical observations, patient history, and epidemiological information. The expected result is Negative.  Fact Sheet for Patients:  BloggerCourse.com  Fact Sheet for Healthcare Providers:  SeriousBroker.it  This test is no t yet approved or cleared by the [place] FDA and  has been authorized for detection and/or diagnosis of SARS-CoV-2 by FDA under an Emergency Use Authorization (EUA). This EUA will remain  in effect (meaning this test can be used) for the duration of the COVID-19 declaration under Section 564(b)(1) of the Act, 21 U.S.C.section 360bbb-3(b)(1), unless the authorization is terminated  or revoked sooner.       Influenza A by PCR NEGATIVE NEGATIVE Final   Influenza B by PCR NEGATIVE NEGATIVE Final    Comment: (NOTE) The Xpert Xpress SARS-CoV-2/FLU/RSV plus assay is intended as an aid in the diagnosis of influenza from Nasopharyngeal swab specimens and should not be used as a sole basis for treatment. Nasal washings and aspirates are unacceptable for Xpert Xpress  SARS-CoV-2/FLU/RSV testing.  Fact Sheet for Patients: BloggerCourse.com  Fact Sheet for Healthcare Providers: SeriousBroker.it  This test is not yet approved or cleared by the [place] FDA and has been authorized for detection and/or diagnosis of SARS-CoV-2 by FDA under an Emergency Use Authorization (EUA). This EUA will remain in effect (meaning this test can be used) for the duration of the COVID-19 declaration under Section 564(b)(1) of the Act, 21 U.S.C. section 360bbb-3(b)(1), unless the authorization is terminated or revoked.  Performed at [facility], [address], [place], [place] [address]      Labs: BNP (last 3 results) No results for input(s): BNP in the last 8760 hours. Basic Metabolic Panel: Recent Labs  Lab 07/06/20 1307 07/06/20 1438 07/07/20 0500 07/07/20 0533 07/08/20 0324 07/09/20 0413  NA 135 135 138  --  137 139  K 3.8 3.6 4.0  --  3.3* 3.9  CL 98  --  106  --  101 105  CO2 23  --  22  --  24 24  GLUCOSE 144*  --  93  --  96 110*  BUN 6  --  <5*  --  <5* 7  CREATININE 0.72  --  0.75  --  0.63 0.82  CALCIUM 9.2  --  8.4*  --  8.9 9.2  MG  --   --   --  1.7  --  2.2   Liver  Function Tests: Recent Labs  Lab 07/06/20 1307 07/08/20 0324  AST 70* 65*  ALT 70* 69*  ALKPHOS 67 64  BILITOT 1.2 1.4*  PROT 6.9 6.1*  ALBUMIN 3.8 3.3*   No results for input(s): LIPASE, AMYLASE in the last 168 hours. No results for input(s): AMMONIA in the last 168 hours. CBC: Recent Labs  Lab 07/06/20 1307 07/06/20 1438 07/07/20 0500  WBC 9.5  --  9.9  NEUTROABS 6.6  --   --   HGB 14.8 15.0 13.5  HCT 44.3 44.0 40.4  MCV 93.9  --  94.4  PLT 166  --  169   Cardiac Enzymes: Recent Labs  Lab 07/07/20 0533  CKTOTAL 804*   BNP: Invalid input(s): POCBNP CBG: Recent Labs  Lab 07/06/20 1304  GLUCAP 138*   D-Dimer No results for input(s): DDIMER in the last 72 hours. Hgb A1c No results  for input(s): HGBA1C in the last 72 hours. Lipid Profile No results for input(s): CHOL, HDL, LDLCALC, TRIG, CHOLHDL, LDLDIRECT in the last 72 hours. Thyroid function studies No results for input(s): TSH, T4TOTAL, T3FREE, THYROIDAB in the last 72 hours.  Invalid input(s): FREET3 Anemia work up No results for input(s): VITAMINB12, FOLATE, FERRITIN, TIBC, IRON, RETICCTPCT in the last 72 hours. Urinalysis    Component Value Date/Time   COLORURINE Yellow 07/01/2014 0844   APPEARANCEUR Clear 07/01/2014 0844   LABSPEC 1.015 07/01/2014 0844   PHURINE 5.0 07/01/2014 0844   GLUCOSEU Negative 07/01/2014 0844   HGBUR Negative 07/01/2014 0844   BILIRUBINUR Negative 07/01/2014 0844   KETONESUR Negative 07/01/2014 0844   PROTEINUR Negative 07/01/2014 0844   NITRITE Negative 07/01/2014 0844   LEUKOCYTESUR Negative 07/01/2014 0844   Sepsis Labs Invalid input(s): PROCALCITONIN,  WBC,  LACTICIDVEN Microbiology Recent Results (from the past 240 hour(s))  Resp Panel by RT-PCR (Flu A&B, Covid) Nasopharyngeal Swab     Status: None   Collection Time: 07/06/20  1:40 PM   Specimen: Nasopharyngeal Swab; Nasopharyngeal(NP) swabs in vial transport medium  Result Value Ref Range Status   SARS Coronavirus 2 by RT PCR NEGATIVE NEGATIVE Final    Comment: (NOTE) SARS-CoV-2 target nucleic acids are NOT DETECTED.  The SARS-CoV-2 RNA is generally detectable in upper respiratory specimens during the acute phase of infection. The lowest concentration of SARS-CoV-2 viral copies this assay can detect is 138 copies/mL. A negative result does not preclude SARS-Cov-2 infection and should not be used as the sole basis for treatment or other patient management decisions. A negative result may occur with  improper specimen collection/handling, submission of specimen other than nasopharyngeal swab, presence of viral mutation(s) within the areas targeted by this assay, and inadequate number of viral copies(<138  copies/mL). A negative result must be combined with clinical observations, patient history, and epidemiological information. The expected result is Negative.  Fact Sheet for Patients:  BloggerCourse.comhttps://www.fda.gov/media/152166/download  Fact Sheet for Healthcare Providers:  SeriousBroker.ithttps://www.fda.gov/media/152162/download  This test is no t yet approved or cleared by the [place] FDA and  has been authorized for detection and/or diagnosis of SARS-CoV-2 by FDA under an Emergency Use Authorization (EUA). This EUA will remain  in effect (meaning this test can be used) for the duration of the COVID-19 declaration under Section 564(b)(1) of the Act, 21 U.S.C.section 360bbb-3(b)(1), unless the authorization is terminated  or revoked sooner.       Influenza A by PCR NEGATIVE NEGATIVE Final   Influenza B by PCR NEGATIVE NEGATIVE Final    Comment: (NOTE) The Xpert  Xpress SARS-CoV-2/FLU/RSV plus assay is intended as an aid in the diagnosis of influenza from Nasopharyngeal swab specimens and should not be used as a sole basis for treatment. Nasal washings and aspirates are unacceptable for Xpert Xpress SARS-CoV-2/FLU/RSV testing.  Fact Sheet for Patients: BloggerCourse.comhttps://www.fda.gov/media/152166/download  Fact Sheet for Healthcare Providers: SeriousBroker.ithttps://www.fda.gov/media/152162/download  This test is not yet approved or cleared by the [place] FDA and has been authorized for detection and/or diagnosis of SARS-CoV-2 by FDA under an Emergency Use Authorization (EUA). This EUA will remain in effect (meaning this test can be used) for the duration of the COVID-19 declaration under Section 564(b)(1) of the Act, 21 U.S.C. section 360bbb-3(b)(1), unless the authorization is terminated or revoked.  Performed at [facility], [address] [place], [place] [address]      Time coordinating discharge: 45 minutes  SIGNED:   Coralie KeensMauricio Daniel Duyen Beckom, MD  [facility] 07/12/2020, 1:42  PM

## 2020-07-12 NOTE — Progress Notes (Addendum)
OT Cancellation Note  Patient Details Name: Ryan Faulkner MRN: [id number] DOB: [birth year]   Cancelled Treatment:    Reason Eval/Treat Not Completed: Patient declined reporting continued agitation after a hectic morning. Patient states that he does not want to take his anger out on anyone and would prefer for OT to return at a later time. OT to check back as time allows.   Addendum: 2nd attempt this afternoon but patient seated EOB eating lunch. OT will continue efforts when patient is available.   Kallie Edward OTR/L Supplemental OT, Department of rehab services [phone]  Kale Dols R H. 07/12/2020, 11:35 AM

## 2020-07-12 NOTE — Progress Notes (Signed)
Physical Therapy Treatment Patient Details Name: Ryan Faulkner MRN: [id number] DOB: 1980-09-20 Today's Date: 07/12/2020    History of Present Illness Pt is a 40 y.o. male with a PMH of suicidal ideation, HTN, and alcohol dependence who presents to the hospital 2/2 intentional overdose of Seroquel 6000 mg. EtOH present on blood work. Per chart, pt is homeless and had told family and friends goodbye prior to this event. Pt with acute toxic metabolic encephalpathy in setting of OD. EEG negative for seizures. CT negative for acute intracranial abnormalities.    PT Comments    Patient reports feeling well today except having some discomfort in foot due to blisters and right shoulder discomfort with flexion/abduction AROM. Scored 23/24 on DGI indicating pt is not at increased risk for falls. Tolerated higher level balance challenges- walking backwards, changes in direction, head turns, changes in gait speed without difficulties or LOB. Tolerated stairs with Mod I holding onto rail for support and cues to decrease speed for safety. Worked on scapular mobilizations to try to help increase right shoulder AROM.  Encouraged walking with sitter while in the hospital to maintain strength. All education completed and goals met or adequate for d/c. Pt no longer requires skilled therapy services as pt functioning at mod I-independent level. Discharge from therapy.   Follow Up Recommendations  No PT follow up     Equipment Recommendations  None recommended by PT    Recommendations for Other Services       Precautions / Restrictions Precautions Precautions: None Precaution Comments: 1:1 sitter, suicide risk, flight risk Restrictions Weight Bearing Restrictions: No    Mobility  Bed Mobility Overal bed mobility: Needs Assistance Bed Mobility: Supine to Sit;Sit to Supine     Supine to sit: Independent Sit to supine: Independent   General bed mobility comments: No assist  needed.  Transfers Overall transfer level: Independent Equipment used: None Transfers: Sit to/from Stand Sit to Stand: Independent         General transfer comment: Able to stand from EOB without difficulty. Stood from [employer], from chair x1.  Ambulation/Gait Ambulation/Gait assistance: Independent Gait Distance (Feet): 400 Feet Assistive device: None Gait Pattern/deviations: WFL(Within Functional Limits) Gait velocity: WFL Gait velocity interpretation: >4.37 ft/sec, indicative of normal walking speed General Gait Details: Steady gait; toleratd higher level balance challenges without difficulty or LOB.   Stairs Stairs: Yes Stairs assistance: Modified independent (Device/Increase time) Stair Management: Alternating pattern;One rail Right Number of Stairs: 13 General stair comments: Pt ascending staitrs at safe speed but going fast down them without any difficulty.   Wheelchair Mobility    Modified Rankin (Stroke Patients Only) Modified Rankin (Stroke Patients Only) Pre-Morbid Rankin Score: No symptoms Modified Rankin: Slight disability     Balance Overall balance assessment: Needs assistance Sitting-balance support: Feet supported;No upper extremity supported Sitting balance-Leahy Scale: Good     Standing balance support: During functional activity Standing balance-Leahy Scale: Good   Single Leg Stance - Right Leg: 30 (increased ankle strategy) Single Leg Stance - Left Leg: 30 Tandem Stance - Right Leg: 30 Tandem Stance - Left Leg: 30     High level balance activites: Backward walking;Direction changes;Turns;Sudden stops;Head turns High Level Balance Comments: Tolerated above with no deviations in gait or difficulty with LOB. Standardized Balance Assessment Standardized Balance Assessment : Dynamic Gait Index   Dynamic Gait Index Level Surface: Normal Change in Gait Speed: Normal Gait with Horizontal Head Turns: Normal Gait with Vertical Head Turns:  Normal Gait and Pivot  Turn: Normal Step Over Obstacle: Normal Step Around Obstacles: Normal Steps: Mild Impairment Total Score: 23      Cognition Arousal/Alertness: Awake/alert Behavior During Therapy: WFL for tasks assessed/performed Overall Cognitive Status: Within Functional Limits for tasks assessed                                 General Comments: Cognition appears much improved.      Exercises      General Comments General comments (skin integrity, edema, etc.): Continues to report blister discomfort. Performed some scapular mobilization to try to help increase shoulder AROM in flexion/abduction.      Pertinent Vitals/Pain Pain Assessment: Faces Faces Pain Scale: Hurts little more Pain Location: Rt shoulder with flexion AROM and palpation; L heel with mobility Pain Descriptors / Indicators: Grimacing;Guarding;Sore Pain Intervention(s): Monitored during session;Repositioned    Home Living                      Prior Function            PT Goals (current goals can now be found in the care plan section) Progress towards PT goals: Goals met/education completed, patient discharged from PT    Frequency    Min 3X/week      PT Plan Current plan remains appropriate    Co-evaluation              AM-PAC PT "6 Clicks" Mobility   Outcome Measure  Help needed turning from your back to your side while in a flat bed without using bedrails?: None Help needed moving from lying on your back to sitting on the side of a flat bed without using bedrails?: None Help needed moving to and from a bed to a chair (including a wheelchair)?: None Help needed standing up from a chair using your arms (e.g., wheelchair or bedside chair)?: None Help needed to walk in hospital room?: None Help needed climbing 3-5 steps with a railing? : A Little 6 Click Score: 23    End of Session   Activity Tolerance: Patient tolerated treatment well Patient left: with  nursing/sitter in room Freight forwarder) Nurse Communication: Mobility status PT Visit Diagnosis: Unsteadiness on feet (R26.81);Other abnormalities of gait and mobility (R26.89);Ataxic gait (R26.0);Difficulty in walking, not elsewhere classified (R26.2)     Time: 0943-1000 PT Time Calculation (min) (ACUTE ONLY): 17 min  Charges:  $Gait Training: 8-22 mins                     Marisa Severin, PT, DPT Acute Rehabilitation Services Pager [phone] Office [place] 07/12/2020, 10:25 AM

## 2020-07-12 NOTE — Progress Notes (Signed)
Patient meets psych inpatient criteria.  Referred to [facility] for possible bed placement.  CSW to continue to follow up.  Ladoris Gene MSW,LCSWA,LCASA Clinical Social Worker  [place] Disposition [phone] (cell)

## 2020-07-12 NOTE — Progress Notes (Signed)
CSW alerted by MD that patient is medically stable for transfer to [facility]. CSW attempted to reach Disposition; left a message requesting a call back.   CSW to follow.  Blenda Nicely, [place] Clinical Social Worker [phone]

## 2020-07-13 LAB — RESP PANEL BY RT-PCR (FLU A&B, COVID) ARPGX2
Influenza A by PCR: NEGATIVE
Influenza B by PCR: NEGATIVE
SARS Coronavirus 2 by RT PCR: NEGATIVE

## 2020-07-13 MED ORDER — DIVALPROEX SODIUM 250 MG PO DR TAB
250.0000 mg | DELAYED_RELEASE_TABLET | Freq: Two times a day (BID) | ORAL | Status: DC
  Administered 2020-07-13 – 2020-07-14 (×3): 250 mg via ORAL
  Filled 2020-07-13 (×3): qty 1

## 2020-07-13 MED ORDER — CARIPRAZINE HCL 1.5 MG PO CAPS
1.5000 mg | ORAL_CAPSULE | Freq: Every day | ORAL | Status: DC
  Administered 2020-07-13 – 2020-07-14 (×2): 1.5 mg via ORAL
  Filled 2020-07-13 (×2): qty 1

## 2020-07-13 MED ORDER — DIPHENHYDRAMINE HCL 25 MG PO CAPS
25.0000 mg | ORAL_CAPSULE | Freq: Four times a day (QID) | ORAL | Status: DC | PRN
  Administered 2020-07-13 – 2020-07-14 (×2): 25 mg via ORAL
  Filled 2020-07-13 (×2): qty 1

## 2020-07-13 NOTE — Progress Notes (Signed)
Patient is medically stable for discharge to inpatient psych.    Vs BP 131/75, HR 89, RR 18, 02 sat 99%  Physical examination not changed.  Follow with psych recommendations, he is stable for discharge to inpatient psych.

## 2020-07-13 NOTE — Progress Notes (Signed)
This RN received a phone call from Officer [name] that pt will not be picked up tonight, rather pt will be scheduled to be transported to Behavioral health tomorrow morning as the facility does not accept patients later than 7pm in [facility], same related to pt's RN Darral Dash, IVC papers in pt's chart. Ryan Faulkner, [name]

## 2020-07-13 NOTE — Consult Note (Signed)
[facility]   Reason for Consult: Suicide attempt Referring Physician:  Dr. Ella JubileeArrien Patient Identification: Ryan SmartBrandon L Faulkner MRN:  [id number] Principal Diagnosis: Intentional overdose of drug in tablet form [facility](HCC) Diagnosis:  Principal Problem:   Intentional overdose of drug in tablet form (HCC) Active Problems:   Major depressive disorder, recurrent severe without psychotic features (HCC)   Alcohol use disorder, severe, dependence (HCC)   Essential hypertension   Tobacco dependence   Class 1 obesity due to excess calories with body mass index (BMI) of 30.0 to 30.9 in adult  Total Time spent with patient: 45 minutes  Subjective:   Ryan SmartBrandon L Faulkner is a 40 y.o. male patient admitted with suicide attempt.  Patient seen and evaluated by this provider.  He presented to the emergency department, after being found down at the hotel.    Patient is alert and oriented, calm and cooperative. He describes his mood as "irritable", and his affect is labile and congruent. Patient appears to be irritable about a multitude of things to include "waiting for a bed, nursing shortage, being taken off wellbutrin, and lack of behavioral health services. He is able to verbalize his decreased interest in seeking inpatient admission. He states "I have been inpatient about 5-6 times, and [facility] stayed there for [facility].  I dont think I need to go. I have a diagnosis of Bipolar Mania. I have had the therapy. I cant un do what I did, but I dont think I need to go back to sit in the hospital. Now Im under IVC, and Im about to request my patient court attorney because I been in the hospital way too long and Im coherent now. " Patient continues to present with irritability and argumentative tone, he expresses interest in IOP. However during his rant he expresses much dislike about virtual services, and he works [employer]. Reviewed the different level of services to include CDIOP, IOP, and  PHP. He currently denies suicidal ideations.  HPI: Ryan GlatterBrandon L Faulkner a 39 y.o.malepast medical history significant for hypertension, depression/anxiety with history of suicidal ideation, alcohol dependence, tobacco dependence presented with excessive movements of extremitieswithconcern for seizures.Patient overdosed on unknown amount of Seroquel +/-BuSpar,found facedown at hotel room atapprox 0700.EMS gave Narcan w/nochange.Patient blood pressure has been running high since admission with systolic of 102-152 and diastolic of 80 to 120 mmHg.Patient is tachycardic with pulse rate ranging from 106-127/minute.He is afebrile and satting well at room air.CT head negative for acute intracranial pathology.Ethyl alcohol level is high at 96.AST andALT mildly elevated at 53 and 64 respectively.Acetaminophen level <10.QTc mildly elevated at 486. Pt has alcohol use disorder and was admitted for alcohol withdrawal recently.  Past Psychiatric History:Bipolar I disorder. Currently under the care of Donell [facility]Spencer Simon [facility]. He was previously taking Depakote and Wellbutrin, Seroquel and Buspar. He remains disappointed that he was stopped off the Wellbutrin. He reports multiple inpatient admissions, most [facility] for [facility] use disorder. He has attempted suicide too many times to count, in which three have required medical admissions.   Legal charges: Pending for assault by pointing a weapon, carrying concealed weapon with no license, carrying concealed weapon while intoxicated with no license, DWI, communicating threat, open container, and driving without taillight.   Risk to Self:  Yes Risk to Others:  No Prior Inpatient Therapy:  Multiple inpatient psychiatric admissions, also [facility] Prior Outpatient Therapy:  [facility] [place]  Past Medical History:  Past Medical History:  Diagnosis Date  . Alcohol  dependence (HCC)   . 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 Hypertension   . Suicidal ideation     Past Surgical History:  Procedure Laterality Date  . TONSILLECTOMY     Family History: No family history on file. Family Psychiatric  History: As per patient father diagnosed with" paranoid delusion disorder."  Patient also reports family history maternal and paternal of depression. Social History:  Social History   Substance and Sexual Activity  Alcohol Use Yes  . Alcohol/week: 24.0 standard drinks  . Types: 24 Cans of beer per week     Social History   Substance and Sexual Activity  Drug Use No    Social History   Socioeconomic History  . Marital status: Single    Spouse name: Not on file  . Number of children: Not on file  . Years of education: Not on file  . Highest education level: Not on file  Occupational History  . Not on file  Tobacco Use  . Smoking status: Current Every Day Smoker  . Smokeless tobacco: Never Used  Substance and Sexual Activity  . Alcohol use: Yes    Alcohol/week: 24.0 standard drinks    Types: 24 Cans of beer per week  . Drug use: No  . Sexual activity: Not on file  Other Topics Concern  . Not on file  Social History Narrative  . Not on file   Social Determinants of Health   Financial Resource Strain: Not on file  Food Insecurity: Not on file  Transportation Needs: Not on file  Physical Activity: Not on file  Stress: Not on file  Social Connections: Not on file   Additional Social History:    Allergies:   Allergies  Allergen Reactions  . Bee Venom Anaphylaxis    Labs:  No results found for this or any previous visit (from the past 48 hour(s)).  Current Facility-Administered Medications  Medication Dose Route Frequency Provider Last Rate Last Admin  . acetaminophen (TYLENOL) tablet 650 mg  650 mg Oral Q6H PRN Jonah Blue, MD   650 mg at 07/13/20 1443   Or  . acetaminophen (TYLENOL) suppository 650  mg  650 mg Rectal Q6H PRN Jonah Blue, MD      . amLODipine (NORVASC) tablet 10 mg  10 mg Oral Daily Arrien, York Ram, MD   10 mg at 07/13/20 1030  . bisacodyl (DULCOLAX) EC tablet 5 mg  5 mg Oral Daily PRN Jonah Blue, MD      . enoxaparin (LOVENOX) injection 40 mg  40 mg Subcutaneous Q24H Jonah Blue, MD   40 mg at 07/11/20 1809  . folic acid (FOLVITE) tablet 1 mg  1 mg Oral Daily Arrien, York Ram, MD   1 mg at 07/13/20 1030  . hydrALAZINE (APRESOLINE) injection 5 mg  5 mg Intravenous Q4H PRN Jonah Blue, MD   5 mg at 07/07/20 2042  . lidocaine (LIDODERM) 5 % 1 patch  1 patch Transdermal Q24H Coralie Keens, MD   1 patch at 07/12/20 1135  . lisinopril (ZESTRIL) tablet 10 mg  10 mg Oral Daily Arrien, York Ram, MD   10 mg at 07/13/20 1030  . multivitamin with minerals tablet 1 tablet  1 tablet Oral Daily Jonah Blue, MD   1 tablet at 07/13/20 1029  . naproxen (NAPROSYN) tablet 500 mg  500 mg Oral BID  WC Arrien, York Ram, MD   500 mg at 07/13/20 0737  . nicotine (NICODERM CQ - dosed in mg/24 hours) patch 21 mg  21 mg Transdermal q1800 Maryagnes Amos, FNP   21 mg at 07/12/20 1202  . ondansetron (ZOFRAN) tablet 4 mg  4 mg Oral Q6H PRN Jonah Blue, MD       Or  . ondansetron [facility]) injection 4 mg  4 mg Intravenous Q6H PRN Jonah Blue, MD      . polyethylene glycol (MIRALAX / GLYCOLAX) packet 17 g  17 g Oral Daily PRN Jonah Blue, MD      . sodium chloride flush (NS) 0.9 % injection 3 mL  3 mL Intravenous Q12H Jonah Blue, MD   3 mL at 07/12/20 2227  . thiamine tablet 100 mg  100 mg Oral Daily Arrien, York Ram, MD   100 mg at 07/13/20 1030    Musculoskeletal: Strength & Muscle Tone: decreased Gait & Station: normal Patient leans: N/A  Psychiatric Specialty Exam: Physical Exam Vitals and nursing note reviewed.  Constitutional:      Appearance: Normal appearance.  HENT:     Head: Normocephalic.     Nose:  Nose normal.  Musculoskeletal:        General: Normal range of motion.     Cervical back: Normal range of motion.  Neurological:     General: No focal deficit present.     Mental Status: He is alert and oriented to person, place, and time.  Psychiatric:        Attention and Perception: Perception normal. He is inattentive.        Mood and Affect: Mood is anxious and depressed.        Speech: Speech normal.        Behavior: Behavior normal. Behavior is cooperative.        Thought Content: Thought content includes suicidal ideation. Thought content includes suicidal plan.        Cognition and Memory: Cognition and memory normal.        Judgment: Judgment normal.     Review of Systems  Psychiatric/Behavioral: Positive for dysphoric mood and suicidal ideas. The patient is nervous/anxious.   All other systems reviewed and are negative.   Blood pressure 121/90, pulse 81, temperature (!) 97.5 F (36.4 C), temperature source Oral, resp. rate 18, height 5\' 10"  (1.778 m), weight 95.3 kg, SpO2 99 %.Body mass index is 30.13 kg/m.  General Appearance: Fairly Groomed  Eye Contact:  Fair  Speech:  Clear and Coherent and Normal Rate  Volume:  Normal  Mood:  Angry and Irritable, " matter of fact"  Affect:  Blunt, Congruent and Labile  Thought Process:  Coherent, Linear and Descriptions of Associations: Circumstantial  Orientation:  Full (Time, Place, and Person)  Thought Content:  Logical, Rumination and Tangential  Suicidal Thoughts:  Denies at this time, recent attempted by overdose led to hospital admission  Homicidal Thoughts:  No  Memory:  Immediate;   Fair Recent;   Fair Remote;   Fair  Judgement:  Poor  Insight:  Present  Psychomotor Activity:  Normal  Concentration:  Concentration: Fair and Attention Span: Fair  Recall:  of Knowledge:  Fair  Language:  Good  Akathisia:  No  Handed:  Right  AIMS (if indicated):     Assets:  Housing Leisure Time Physical  Health Resilience  ADL's:  Intact  Cognition:  WNL  Sleep:  Treatment Plan Summary: Plan Continue current medications.  Recommend working closely with social work to facilitate inpatient psychiatric admission.  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.  -Continue CIWA detox protocol -Continue nicotine patch to 21 mg. -Continue IVC due to suicide attempt.  There continues to be ongoing safety concerns therefore he continues to meet criteria. -Will start Depakote DR 250mg  po BID for bipolar mania. Will start vraylar 1.5mg  po daily for bipolar mania, and depressive symptoms.    Alcohol use disorder, severe: -Ativan alcohol detox started  Disposition: Recommend psychiatric Inpatient admission when medically cleared.  , FNP 07/13/2020 12:46 PM

## 2020-07-13 NOTE — Progress Notes (Signed)
RN attempted to call for report but receiving facility needs night shift to accept report. RN called sheriffs office and left voicemail with callback number to the unit.

## 2020-07-13 NOTE — Progress Notes (Signed)
Patient can admit to [facility] this evening, pending negative COVID test. Please see BH Assessment note for admission details and number to call for report.  Once COVID test results negative, RN to call [facility] for transportation after 9:30 PM. Number to Kathryne Sharper is [phone]. Sheriff must be told that patient is under IVC.  Blenda Nicely, [place] Clinical Social Worker [phone]

## 2020-07-13 NOTE — BH Assessment (Addendum)
Patient can come after 9:30pm pending negative COVID   Please be sure to discharge pt before arrival   Patient has been accepted to [facility].  Accepting physician is Dr. Neale Burly.  Attending Physician will be Dr. Neale Burly.  Patient has been assigned to room 320, by [facility] Charge Nurse [place].   Call report to [phone].  Representative/Transfer Coordinator is Cynthia Stainback  Patient pre-admitted by [facility] Patient Access [facility])  [facility] ER Staff Caryn Bee, FNP & Josie Dixon., LCSW ) made aware of acceptance.

## 2020-07-14 ENCOUNTER — Other Ambulatory Visit: Payer: Self-pay

## 2020-07-14 ENCOUNTER — Inpatient Hospital Stay
Admission: AD | Admit: 2020-07-14 | Discharge: 2020-07-15 | DRG: 885 | Disposition: A | Discharge status: Home / Self Care | Payer: BC Managed Care – PPO | Source: Intra-hospital | Attending: Behavioral Health | Admitting: Behavioral Health

## 2020-07-14 ENCOUNTER — Encounter: Payer: Self-pay | Admitting: Behavioral Health

## 2020-07-14 DIAGNOSIS — F1020 Alcohol dependence, uncomplicated: Secondary | ICD-10-CM | POA: Diagnosis present

## 2020-07-14 DIAGNOSIS — Z9151 Personal history of suicidal behavior: Secondary | ICD-10-CM

## 2020-07-14 DIAGNOSIS — Z20822 Contact with and (suspected) exposure to covid-19: Secondary | ICD-10-CM | POA: Diagnosis present

## 2020-07-14 DIAGNOSIS — F314 Bipolar disorder, current episode depressed, severe, without psychotic features: Principal | ICD-10-CM | POA: Diagnosis present

## 2020-07-14 DIAGNOSIS — E569 Vitamin deficiency, unspecified: Secondary | ICD-10-CM | POA: Diagnosis present

## 2020-07-14 DIAGNOSIS — F17200 Nicotine dependence, unspecified, uncomplicated: Secondary | ICD-10-CM | POA: Diagnosis present

## 2020-07-14 DIAGNOSIS — I10 Essential (primary) hypertension: Secondary | ICD-10-CM | POA: Diagnosis present

## 2020-07-14 MED ORDER — ACETAMINOPHEN 325 MG PO TABS: 650.0000 mg | ORAL_TABLET | Freq: Four times a day (QID) | ORAL | Status: DC | PRN

## 2020-07-14 MED ORDER — FOLIC ACID 1 MG PO TABS
1.0000 mg | ORAL_TABLET | Freq: Every day | ORAL | Status: DC
  Administered 2020-07-15: 1 mg via ORAL
  Filled 2020-07-14: qty 1

## 2020-07-14 MED ORDER — POLYETHYLENE GLYCOL 3350 17 G PO PACK: 17.0000 g | PACK | Freq: Every day | ORAL | Status: DC | PRN

## 2020-07-14 MED ORDER — AMLODIPINE BESYLATE 5 MG PO TABS
10.0000 mg | ORAL_TABLET | Freq: Every day | ORAL | Status: DC
  Administered 2020-07-15: 10 mg via ORAL
  Filled 2020-07-14: qty 2

## 2020-07-14 MED ORDER — CARIPRAZINE HCL 1.5 MG PO CAPS: 1.5000 mg | ORAL_CAPSULE | Freq: Every day | ORAL | Status: DC

## 2020-07-14 MED ORDER — TRAZODONE HCL 100 MG PO TABS
100.0000 mg | ORAL_TABLET | Freq: Every evening | ORAL | Status: DC | PRN
  Administered 2020-07-14: 100 mg via ORAL
  Filled 2020-07-14: qty 1

## 2020-07-14 MED ORDER — LISINOPRIL 20 MG PO TABS
10.0000 mg | ORAL_TABLET | Freq: Every day | ORAL | Status: DC
  Administered 2020-07-15: 10 mg via ORAL
  Filled 2020-07-14: qty 1

## 2020-07-14 MED ORDER — DIVALPROEX SODIUM 250 MG PO DR TAB
250.0000 mg | DELAYED_RELEASE_TABLET | Freq: Two times a day (BID) | ORAL | Status: DC
  Administered 2020-07-14 – 2020-07-15 (×2): 250 mg via ORAL
  Filled 2020-07-14 (×2): qty 1

## 2020-07-14 MED ORDER — DIVALPROEX SODIUM 250 MG PO DR TAB: 250.0000 mg | DELAYED_RELEASE_TABLET | Freq: Two times a day (BID) | ORAL | Status: DC

## 2020-07-14 MED ORDER — THIAMINE HCL 100 MG PO TABS
100.0000 mg | ORAL_TABLET | Freq: Every day | ORAL | Status: DC
  Administered 2020-07-15: 100 mg via ORAL
  Filled 2020-07-14: qty 1

## 2020-07-14 MED ORDER — DIPHENHYDRAMINE HCL 25 MG PO CAPS
25.0000 mg | ORAL_CAPSULE | Freq: Four times a day (QID) | ORAL | Status: DC | PRN
  Administered 2020-07-14: 25 mg via ORAL
  Filled 2020-07-14: qty 1

## 2020-07-14 MED ORDER — ACETAMINOPHEN 325 MG PO TABS
650.0000 mg | ORAL_TABLET | Freq: Four times a day (QID) | ORAL | Status: DC | PRN
  Administered 2020-07-14 – 2020-07-15 (×2): 650 mg via ORAL
  Filled 2020-07-14 (×2): qty 2

## 2020-07-14 MED ORDER — MAGNESIUM HYDROXIDE 400 MG/5ML PO SUSP: 30.0000 mL | Freq: Every day | ORAL | Status: DC | PRN

## 2020-07-14 MED ORDER — ACETAMINOPHEN 650 MG RE SUPP
650.0000 mg | Freq: Four times a day (QID) | RECTAL | Status: DC | PRN
  Filled 2020-07-14: qty 1

## 2020-07-14 MED ORDER — ALUM & MAG HYDROXIDE-SIMETH 200-200-20 MG/5ML PO SUSP: 30.0000 mL | ORAL | Status: DC | PRN

## 2020-07-14 MED ORDER — LIDOCAINE 5 % EX PTCH
1.0000 | MEDICATED_PATCH | CUTANEOUS | Status: DC
  Administered 2020-07-15: 1 via TRANSDERMAL
  Filled 2020-07-14: qty 1

## 2020-07-14 MED ORDER — BISACODYL 5 MG PO TBEC
5.0000 mg | DELAYED_RELEASE_TABLET | Freq: Every day | ORAL | Status: DC | PRN
  Filled 2020-07-14: qty 1

## 2020-07-14 MED ORDER — NICOTINE 21 MG/24HR TD PT24
21.0000 mg | MEDICATED_PATCH | Freq: Every day | TRANSDERMAL | Status: DC
  Administered 2020-07-14: 21 mg via TRANSDERMAL
  Filled 2020-07-14: qty 1

## 2020-07-14 MED ORDER — NAPROXEN 500 MG PO TABS
500.0000 mg | ORAL_TABLET | Freq: Two times a day (BID) | ORAL | Status: DC
  Administered 2020-07-14 – 2020-07-15 (×2): 500 mg via ORAL
  Filled 2020-07-14 (×2): qty 1

## 2020-07-14 MED ORDER — CARIPRAZINE HCL 1.5 MG PO CAPS
1.5000 mg | ORAL_CAPSULE | Freq: Every day | ORAL | Status: DC
  Administered 2020-07-15: 1.5 mg via ORAL
  Filled 2020-07-14: qty 1

## 2020-07-14 MED ORDER — ADULT MULTIVITAMIN W/MINERALS CH
1.0000 | ORAL_TABLET | Freq: Every day | ORAL | Status: DC
  Administered 2020-07-15: 1 via ORAL
  Filled 2020-07-14: qty 1

## 2020-07-14 NOTE — Plan of Care (Signed)
  Problem: Clinical Measurements: Goal: Will remain free from infection Outcome: Progressing   Problem: Health Behavior/Discharge Planning: Goal: Ability to manage health-related needs will improve Outcome: Progressing   Problem: Education: Goal: Knowledge of General Education information will improve Description: Including pain rating scale, medication(s)/side effects and non-pharmacologic comfort measures Outcome: Progressing   

## 2020-07-14 NOTE — Progress Notes (Signed)
Occupational Therapy Treatment Patient Details Name: Ryan Faulkner MRN: [id number] DOB: 02/21/1981 Today's Date: 07/14/2020    History of present illness Pt is a 40 y.o. male with a PMH of suicidal ideation, HTN, and alcohol dependence who presents to the hospital 2/2 intentional overdose of Seroquel 6000 mg. EtOH present on blood work. Per chart, pt is homeless and had told family and friends goodbye prior to this event. Pt with acute toxic metabolic encephalpathy in setting of OD. EEG negative for seizures. CT negative for acute intracranial abnormalities.   OT comments  Pt making gradual progress towards OT goals this session. Pt perseverating on current situation and not wanting to go to [facility] behavioral health, attempted to redirect pt back to session however pt self limiting. Pt continues to report pain in R shoulder, pt reports he thinks he hurt his R shoulder from sleeping in his car for a full day and resting his head in his R hand putting to much pressure on his shoulder. Issued pt written HEP for scapular/ shoulder therex for pain mgmt and to increase AROM. Pt would continue to benefit from skilled occupational therapy while admitted and after d/c to address the below listed limitations in order to improve overall functional mobility and facilitate independence with BADL participation. DC plan remains appropriate, will follow acutely per POC.     Follow Up Recommendations  Outpatient OT;Supervision - Intermittent    Equipment Recommendations  None recommended by OT    Recommendations for Other Services      Precautions / Restrictions Precautions Precautions: None Precaution Comments: 1:1 sitter, suicide risk, flight risk Restrictions Weight Bearing Restrictions: No       Mobility Bed Mobility Overal bed mobility: Independent Bed Mobility: Supine to Sit;Sit to Supine     Supine to sit: Independent Sit to supine: Independent   General bed mobility comments: pt  transition from supine<>sitting with no physical assist from flat [facility]  Transfers                 General transfer comment: pt declined OOOB mobility    Balance Overall balance assessment: Needs assistance Sitting-balance support: Feet supported;No upper extremity supported Sitting balance-Leahy Scale: Good                                     ADL either performed or assessed with clinical judgement   ADL Overall ADL's : Needs assistance/impaired                 Upper Body Dressing : Independent Upper Body Dressing Details (indicate cue type and reason): pt declined dressing in front of therapist but this OTA returned from printing HEP with pt dressed and ready Lower Body Dressing: Independent Lower Body Dressing Details (indicate cue type and reason): pt declined dressing in front of therapist but this OTA returned from printing HEP with pt dressed and ready   Toilet Transfer Details (indicate cue type and reason): pt declined OOB transfer         Functional mobility during ADLs:  (per chart review pt independent with mobility) General ADL Comments: pt presents with increased R shoulder pain and self limiting behaviors     Vision       Perception     Praxis      Cognition Arousal/Alertness: Awake/alert Behavior During Therapy: WFL for tasks assessed/performed Overall Cognitive Status: No family/caregiver present to determine baseline cognitive  functioning                                 General Comments: overall WFL but perseverating on level of care and not wanting to go to [facility], difficult to redirect back to session. self limiting behaviors noted        Exercises Other Exercises Other Exercises: scapular protraction<>retraction x10 reps 3 sets Other Exercises: scapular elevation<> depression x10 reps 3 sets   Shoulder Instructions       General Comments isssued pt written HEP to increase carryover    Pertinent  Vitals/ Pain       Pain Assessment: Faces Faces Pain Scale: Hurts little more Pain Location: R shoulder with horizontal sh ABD Pain Descriptors / Indicators: Grimacing;Guarding;Sore Pain Intervention(s): Limited activity within patient's tolerance;Monitored during session  Home Living                                          Prior Functioning/Environment              Frequency  Min 2X/week        Progress Toward Goals  OT Goals(current goals can now be found in the care plan section)  Progress towards OT goals: Progressing toward goals  Acute Rehab OT Goals Patient Stated Goal: to improve his R shoulder OT Goal Formulation: With patient Time For Goal Achievement: 07/23/20 Potential to Achieve Goals: Good  Plan Discharge plan remains appropriate;Frequency remains appropriate    Co-evaluation                 AM-PAC OT "6 Clicks" Daily Activity     Outcome Measure   Help from another person eating meals?: None Help from another person taking care of personal grooming?: None Help from another person toileting, which includes using toliet, bedpan, or urinal?: None Help from another person bathing (including washing, rinsing, drying)?: None Help from another person to put on and taking off regular upper body clothing?: None Help from another person to put on and taking off regular lower body clothing?: None 6 Click Score: 24    End of Session    OT Visit Diagnosis: Muscle weakness (generalized) (M62.81);Pain Pain - Right/Left: Right Pain - part of body: Shoulder   Activity Tolerance Patient tolerated treatment well   Patient Left in bed;with call bell/phone within reach;with nursing/sitter in room   Nurse Communication Mobility status        Time: 7654-6503 OT Time Calculation (min): 14 min  Charges: OT General Charges $OT Visit: 1 Visit OT Treatments $Therapeutic Exercise: 8-22 mins  Lenor Derrick., COTA/L Acute Rehabilitation  Services [phone]    Barron Schmid 07/14/2020, 9:45 AM

## 2020-07-14 NOTE — Progress Notes (Signed)
Pt has been discharged from unit via [employer]. PT has all belongings sent with him and has the disposable scrubs on as well. AVS was given and reviewed with the pt.

## 2020-07-14 NOTE — H&P (Signed)
Psychiatric Admission Assessment Adult  Patient Identification: Ryan Faulkner MRN:  [id number] Date of Evaluation:  07/14/2020 Chief Complaint:  MDD (major depressive disorder), recurrent severe, without psychosis (HCC) [F33.2] Principal Diagnosis: Severe bipolar I disorder, most recent episode depressed (HCC) Diagnosis:  Principal Problem:   Severe bipolar I disorder, most recent episode depressed (HCC) Active Problems:   Alcohol use disorder, severe, dependence (HCC)   Essential hypertension   Tobacco dependence  History of Present Illness: 40 year old male with history of bipolar depression and severe alcohol use disorder who presented to outside hospital on January 11 for intentional overdose of unknown amount of Seroquel and Buspar. He has denied suicidal ideation since Jan 13th, and feels the combination of Depakote and Leafy Kindle are working well for his depression. He notes that he has been hospitalized "more times than he can remember" and does not feel this will be helpful for him. He notes he already completed his acute detox from alcohol, and they have already found a new medication for bipolar depression, referring to vraylar. He states he knows he has a lot of work to do on his mental health, and feels he would most benefit from [facility] for his alchohol use disorder. He currently sees a psychiatrist at [facility], and sees a substance abuse counselor once weekly. He notes that he works Friday-Sunday, but if he is not at work he has a hard time doing anything other than drinking to waste time. He feels that having counseling three times a week could help him remain sober. At this time he declines inpatient rehab, [facility], or long-term rehab. His plan is to move into a trailor, continue his job, and do SAIOP with .   Associated Signs/Symptoms: Depression Symptoms:  depressed mood, Duration of Depression Symptoms: No data recorded (Hypo) Manic Symptoms:   Impulsivity, Irritable Mood, Anxiety Symptoms:  Excessive Worry, Psychotic Symptoms:  Denies Duration of Psychotic Symptoms: No data recorded PTSD Symptoms: Negative Total Time spent with patient: 1 hour  Past Psychiatric History: Bipolar I disorder. Currently under the care of [name] at [facility]. He was previously taking Depakote and Wellbutrin, Seroquel and Buspar. He remains disappointed that he was stopped off the Wellbutrin. He reports multiple inpatient admissions, most recently [facility] for alcohol use disorder. He has attempted suicide "too many times to count", in which three have required medical admissions.   Is the patient at risk to self? Yes.    Has the patient been a risk to self in the past 6 months? Yes.    Has the patient been a risk to self within the distant past? Yes.    Is the patient a risk to others? No.  Has the patient been a risk to others in the past 6 months? No.  Has the patient been a risk to others within the distant past? No.   Prior Inpatient Therapy:   Prior Outpatient Therapy:    Alcohol Screening: 1. How often do you have a drink containing alcohol?: 4 or more times a week 2. How many drinks containing alcohol do you have on a typical day when you are drinking?: 7, 8, or 9 3. How often do you have six or more drinks on one occasion?: Daily or almost daily AUDIT-C Score: 11 4. How often during the last year have you found that you were not able to stop drinking once you had started?: Never 5. How often during the last year have you failed to do what  was normally expected from you because of drinking?: Less than monthly 6. How often during the last year have you needed a first drink in the morning to get yourself going after a heavy drinking session?: Weekly 7. How often during the last year have you had a feeling of guilt of remorse after drinking?: Never 8. How often during the last year have you been unable to remember what  happened the night before because you had been drinking?: Less than monthly 9. Have you or someone else been injured as a result of your drinking?: No 10. Has a relative or friend or a doctor or another health worker been concerned about your drinking or suggested you cut down?: Yes, during the last year Alcohol Use Disorder Identification Test Final Score (AUDIT): 20 Alcohol Brief Interventions/Follow-up: Patient Refused Substance Abuse History in the last 12 months:  Yes.   Consequences of Substance Abuse: Legal Consequences:  DUI Withdrawal Symptoms:   previously had nausea, vomiting, tremors Previous Psychotropic Medications: Yes  Psychological Evaluations: Yes  Past Medical History:  Past Medical History:  Diagnosis Date  . Alcohol dependence (HCC)   . 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 Hypertension   . Suicidal ideation     Past Surgical History:  Procedure Laterality Date  . TONSILLECTOMY     Family History: History reviewed. No pertinent family history. Family Psychiatric  History: As per patient father diagnosed with" paranoid delusion disorder."  Patient also reports family history maternal and paternal of depression. Tobacco Screening:   Social History:  Social History   Substance and Sexual Activity  Alcohol Use Yes  . Alcohol/week: 24.0 standard drinks  . Types: 24 Cans of beer per week     Social History   Substance and Sexual Activity  Drug Use No    Additional Social History:                           Allergies:   Allergies  Allergen Reactions  . Bee Venom Anaphylaxis   Lab Results:  Results for orders placed or performed during the hospital encounter of 07/06/20 (from the past 48 hour(s))  Resp Panel by RT-PCR (Flu A&B, Covid) Nasopharyngeal Swab     Status: None   Collection Time: 07/13/20  3:19 PM   Specimen: Nasopharyngeal Swab; Nasopharyngeal(NP) swabs in vial transport medium  Result Value Ref Range   SARS  Coronavirus 2 by RT PCR NEGATIVE NEGATIVE    Comment: (NOTE) SARS-CoV-2 target nucleic acids are NOT DETECTED.  The SARS-CoV-2 RNA is generally detectable in upper respiratory specimens during the acute phase of infection. The lowest concentration of SARS-CoV-2 viral copies this assay can detect is 138 copies/mL. A negative result does not preclude SARS-Cov-2 infection and should not be used as the sole basis for treatment or other patient management decisions. A negative result may occur with  improper specimen collection/handling, submission of specimen other than nasopharyngeal swab, presence of viral mutation(s) within the areas targeted by this assay, and inadequate number of viral copies(<138 copies/mL). A negative result must be combined with clinical observations, patient history, and epidemiological information. The expected result is Negative.  Fact Sheet for Patients:  BloggerCourse.comhttps://www.fda.gov/media/152166/download  Fact Sheet for Healthcare Providers:  SeriousBroker.ithttps://www.fda.gov/media/152162/download  This test is no t yet approved or cleared by the [place] FDA and  has been authorized for detection and/or diagnosis of SARS-CoV-2 by FDA under an Emergency  Use Authorization (EUA). This EUA will remain  in effect (meaning this test can be used) for the duration of the COVID-19 declaration under Section 564(b)(1) of the Act, 21 U.S.C.section 360bbb-3(b)(1), unless the authorization is terminated  or revoked sooner.       Influenza A by PCR NEGATIVE NEGATIVE   Influenza B by PCR NEGATIVE NEGATIVE    Comment: (NOTE) The Xpert Xpress SARS-CoV-2/FLU/RSV plus assay is intended as an aid in the diagnosis of influenza from Nasopharyngeal swab specimens and should not be used as a sole basis for treatment. Nasal washings and aspirates are unacceptable for Xpert Xpress SARS-CoV-2/FLU/RSV testing.  Fact Sheet for Patients: BloggerCourse.com  Fact Sheet  for Healthcare Providers: SeriousBroker.it  This test is not yet approved or cleared by the [place] FDA and has been authorized for detection and/or diagnosis of SARS-CoV-2 by FDA under an Emergency Use Authorization (EUA). This EUA will remain in effect (meaning this test can be used) for the duration of the COVID-19 declaration under Section 564(b)(1) of the Act, 21 U.S.C. section 360bbb-3(b)(1), unless the authorization is terminated or revoked.  Performed at [facility], [address] [place], [place] [address]     Blood Alcohol level:  Lab Results  Component Value Date   ETH 96 (H) 07/06/2020   ETH 336 (HH) 03/09/2020    Metabolic Disorder Labs:  No results found for: HGBA1C, MPG No results found for: PROLACTIN No results found for: CHOL, TRIG, HDL, CHOLHDL, VLDL, LDLCALC  Current Medications: Current Facility-Administered Medications  Medication Dose Route Frequency Provider Last Rate Last Admin  . acetaminophen (TYLENOL) tablet 650 mg  650 mg Oral Q6H PRN Jesse Sans, MD       Or  . acetaminophen (TYLENOL) suppository 650 mg  650 mg Rectal Q6H PRN Jesse Sans, MD      . alum & mag hydroxide-simeth (MAALOX/MYLANTA) 200-200-20 MG/5ML suspension 30 mL  30 mL Oral Q4H PRN Jesse Sans, MD      . Melene Muller ON 07/15/2020] amLODipine (NORVASC) tablet 10 mg  10 mg Oral Daily Jesse Sans, MD      . bisacodyl (DULCOLAX) EC tablet 5 mg  5 mg Oral Daily PRN Jesse Sans, MD      . Melene Muller ON 07/15/2020] cariprazine (VRAYLAR) capsule 1.5 mg  1.5 mg Oral Daily Jesse Sans, MD      . diphenhydrAMINE (BENADRYL) capsule 25 mg  25 mg Oral Q6H PRN Jesse Sans, MD      . divalproex (DEPAKOTE) DR tablet 250 mg  250 mg Oral Q12H Jesse Sans, MD      . Melene Muller ON 07/15/2020] folic acid (FOLVITE) tablet 1 mg  1 mg Oral Daily Jesse Sans, MD      . Melene Muller ON 07/15/2020] lidocaine (LIDODERM) 5 % 1 patch  1 patch  Transdermal Q24H Jesse Sans, MD      . Melene Muller ON 07/15/2020] lisinopril (ZESTRIL) tablet 10 mg  10 mg Oral Daily Les Pou M, MD      . magnesium hydroxide (MILK OF MAGNESIA) suspension 30 mL  30 mL Oral Daily PRN Jesse Sans, MD      . Melene Muller ON 07/15/2020] multivitamin with minerals tablet 1 tablet  1 tablet Oral Daily Jesse Sans, MD      . naproxen (NAPROSYN) tablet 500 mg  500 mg Oral BID WC Jesse Sans, MD      . nicotine (NICODERM  CQ - dosed in mg/24 hours) patch 21 mg  21 mg Transdermal q1800 Jesse SansFreeman, Lamaya Hyneman M, MD   21 mg at 07/14/20 1332  . polyethylene glycol (MIRALAX / GLYCOLAX) packet 17 g  17 g Oral Daily PRN Jesse SansFreeman, Mujahid Jalomo M, MD      . Melene Muller[START ON 07/15/2020] thiamine tablet 100 mg  100 mg Oral Daily Jesse SansFreeman, Paislyn Domenico M, MD      . traZODone (DESYREL) tablet 100 mg  100 mg Oral QHS PRN Jesse SansFreeman, Relena Ivancic M, MD       PTA Medications: Medications Prior to Admission  Medication Sig Dispense Refill Last Dose  . acetaminophen (TYLENOL) 325 MG tablet Take 2 tablets (650 mg total) by mouth every 6 (six) hours as needed for mild pain or moderate pain.     Marland Kitchen. amLODipine (NORVASC) 10 MG tablet Take 10 mg by mouth daily.     Melene Muller. [START ON 07/15/2020] cariprazine (VRAYLAR) capsule Take 1 capsule (1.5 mg total) by mouth daily. 30 capsule    . divalproex (DEPAKOTE) 250 MG DR tablet Take 1 tablet (250 mg total) by mouth every 12 (twelve) hours.     Marland Kitchen. EPINEPHrine 0.3 mg/0.3 mL IJ SOAJ injection Inject 0.3 mg into the muscle as needed for anaphylaxis.     Marland Kitchen. lisinopril (ZESTRIL) 10 MG tablet Take 1 tablet by mouth daily.     . naproxen (NAPROSYN) 500 MG tablet Take 1 tablet (500 mg total) by mouth 2 (two) times daily with a meal. 20 tablet 00   . thiamine 100 MG tablet Take 1 tablet (100 mg total) by mouth daily. 30 tablet 0     Musculoskeletal: Strength & Muscle Tone: within normal limits Gait & Station: normal Patient leans: N/A  Psychiatric Specialty Exam: Physical Exam Vitals  and nursing note reviewed.  Constitutional:      Appearance: Normal appearance.  HENT:     Head: Normocephalic and atraumatic.     Right Ear: External ear normal.     Left Ear: External ear normal.     Nose: Nose normal.     Mouth/Throat:     Mouth: Mucous membranes are moist.     Pharynx: Oropharynx is clear.  Eyes:     Extraocular Movements: Extraocular movements intact.     Conjunctiva/sclera: Conjunctivae normal.     Pupils: Pupils are equal, round, and reactive to light.  Cardiovascular:     Rate and Rhythm: Normal rate.     Pulses: Normal pulses.  Pulmonary:     Effort: Pulmonary effort is normal.     Breath sounds: Normal breath sounds.  Abdominal:     General: Abdomen is flat.     Palpations: Abdomen is soft.  Musculoskeletal:        General: No swelling. Normal range of motion.     Cervical back: Normal range of motion and neck supple.  Skin:    General: Skin is warm and dry.  Neurological:     General: No focal deficit present.     Mental Status: He is alert and oriented to person, place, and time.  Psychiatric:        Attention and Perception: Attention and perception normal.        Mood and Affect: Affect normal. Mood is depressed.        Speech: Speech normal.        Behavior: Behavior is cooperative.        Thought Content: Thought content is not paranoid or delusional. Thought  content does not include homicidal or suicidal ideation.        Cognition and Memory: Cognition and memory normal.        Judgment: Judgment is impulsive.     Review of Systems  Constitutional: Negative for appetite change and fatigue.  HENT: Negative for rhinorrhea and sore throat.   Eyes: Negative for photophobia and visual disturbance.  Respiratory: Negative for cough and shortness of breath.   Cardiovascular: Negative for chest pain and palpitations.  Gastrointestinal: Negative for constipation, diarrhea, nausea and vomiting.  Endocrine: Negative for cold intolerance and  heat intolerance.  Genitourinary: Negative for difficulty urinating and dysuria.  Musculoskeletal: Negative for arthralgias and myalgias.  Skin: Negative for rash and wound.  Allergic/Immunologic: Negative for food allergies and immunocompromised state.  Neurological: Negative for dizziness and headaches.  Hematological: Negative for adenopathy. Does not bruise/bleed easily.  Psychiatric/Behavioral: Negative for hallucinations, sleep disturbance and suicidal ideas.    Blood pressure 139/87, pulse 85, temperature 98.3 F (36.8 C), temperature source Oral, resp. rate 16, height 5\' 10"  (1.778 m), weight 90.3 kg, SpO2 99 %.Body mass index is 28.55 kg/m.  General Appearance: Well Groomed  Eye Contact:  Good  Speech:  Clear and Coherent  Volume:  Normal  Mood:  Irritable  Affect:  Congruent  Thought Process:  Coherent and Linear  Orientation:  Full (Time, Place, and Person)  Thought Content:  Logical  Suicidal Thoughts:  No  Homicidal Thoughts:  No  Memory:  Immediate;   Fair Recent;   Fair Remote;   Fair  Judgement:  Intact  Insight:  Shallow  Psychomotor Activity:  Normal  Concentration:  Concentration: Fair and Attention Span: Fair  Recall:  of Knowledge:  Fair  Language:  Fair  Akathisia:  Negative  Handed:  Right  AIMS (if indicated):     Assets:  Communication Skills Desire for Improvement Financial Resources/Insurance Intimacy Physical Health Resilience Talents/Skills Transportation Vocational/Educational  ADL's:  Intact  Cognition:  WNL  Sleep:         Treatment Plan Summary: Daily contact with patient to assess and evaluate symptoms and progress in treatment and Medication management PLAN OF CARE: 40 year old male with history of bipolar depression and severe alcohol use disorder who presented to outside hospital on January 11 for intentional overdose of unknown amount of Seroquel and Buspar. He has denied suicidal ideation since Jan 13th, and feels  the combination of Depakote and 08-10-1985 are working well for his depression. Continue medications as above. He feels he would benefit from intensive outpatient treatment for alcohol use disorder.   Observation Level/Precautions:  15 minute checks  Laboratory:  Completed at outside hospital  Psychotherapy:    Medications:    Consultations:    Discharge Concerns:    Estimated LOS:  Other:     Physician Treatment Plan for Primary Diagnosis: Severe bipolar I disorder, most recent episode depressed (HCC) Long Term Goal(s): Improvement in symptoms so as ready for discharge  Short Term Goals: Ability to identify changes in lifestyle to reduce recurrence of condition will improve, Ability to verbalize feelings will improve, Ability to disclose and discuss suicidal ideas, Ability to demonstrate self-control will improve, Ability to identify and develop effective coping behaviors will improve, Compliance with prescribed medications will improve and Ability to identify triggers associated with substance abuse/mental health issues will improve  Physician Treatment Plan for Secondary Diagnosis: Principal Problem:   Severe bipolar I disorder, most recent episode depressed (HCC) Active Problems:  Alcohol use disorder, severe, dependence (HCC)   Essential hypertension   Tobacco dependence  Long Term Goal(s): Improvement in symptoms so as ready for discharge  Short Term Goals: Ability to identify changes in lifestyle to reduce recurrence of condition will improve, Ability to verbalize feelings will improve, Ability to disclose and discuss suicidal ideas, Ability to demonstrate self-control will improve, Ability to identify and develop effective coping behaviors will improve, Compliance with prescribed medications will improve and Ability to identify triggers associated with substance abuse/mental health issues will improve  I certify that inpatient services furnished can reasonably be expected to improve  the patient's condition.    Jesse Sans, MD 1/19/20221:55 PM

## 2020-07-14 NOTE — BHH Group Notes (Signed)
LCSW Group Therapy Note  07/14/2020 2:09 PM  Type of Therapy/Topic:  Group Therapy:  Emotion Regulation  Participation Level:  Did Not Attend   Description of Group:   The purpose of this group is to assist patients in learning to regulate negative emotions and experience positive emotions. Patients will be guided to discuss ways in which they have been vulnerable to their negative emotions. These vulnerabilities will be juxtaposed with experiences of positive emotions or situations, and patients will be challenged to use positive emotions to combat negative ones. Special emphasis will be placed on coping with negative emotions in conflict situations, and patients will process healthy conflict resolution skills.  Therapeutic Goals: 1. Patient will identify two positive emotions or experiences to reflect on in order to balance out negative emotions 2. Patient will label two or more emotions that they find the most difficult to experience 3. Patient will demonstrate positive conflict resolution skills through discussion and/or role plays  Summary of Patient Progress: Patient did not attend group despite encouraged participation.    Therapeutic Modalities:   Cognitive Behavioral Therapy Feelings Identification Dialectical Behavioral Therapy  Jacqualine Weichel, MSW, LCSWA, LCASA 07/14/2020 2:09 PM  

## 2020-07-14 NOTE — BHH Counselor (Signed)
CSW contacted Charmian Muff, [phone] in an effort to get information on the CDIOP vs SAIOP program.  CSW left HIPAA compliant voicemail requesting return phone call.  Penni Homans, MSW, LCSW 07/14/2020 2:38 PM

## 2020-07-14 NOTE — Progress Notes (Signed)
Ryan Faulkner is a 40 y.o. male patient admitted with suicide attempt. He presented to the emergency department, after being found down at the hotel.  Patient shares he has been through multiple jobs, failed relationships, it is my father's 1 year anniversary of his death, homeless, and no car."  Patient reports his depression is worse with increase in alcohol use, which led to his recent suicide attempt.  He states after being intoxicated, he decided to take between" 25-30 buspirone, and took 2 Seroquel tablets.  I took the Seroquel as prescribed.  And I just said fuck it."  When assessing for psychiatric history he reports a diagnosis of bipolar manic depression.  He reports he is [facility] with [name], and receiving therapeutic services from [facility].  He reports numerous attempts at suicide " too many to count".  He reports 2 previous suicide attempts by overdose that resulted in medical intervention.  He reports 5-6 previous inpatient hospitalizations, with the last hospitalization being in August 2021 where he was admitted inpatient in the [place].    Skin assessment preformed upon admission. Pt has multiple abrasions (hands, knees, etc) as well as old blood blisters on bottoms of feet.

## 2020-07-14 NOTE — Tx Team (Signed)
Initial Treatment Plan 07/14/2020 1:04 PM [name] TDH:[id number]    PATIENT STRESSORS: Legal issue Other: living situation   PATIENT STRENGTHS: Motivation for treatment/growth Physical Health   PATIENT IDENTIFIED PROBLEMS: Ineffective coping skills  homeless                   DISCHARGE CRITERIA:  Improved stabilization in mood, thinking, and/or behavior Motivation to continue treatment in a less acute level of care  PRELIMINARY DISCHARGE PLAN: Outpatient therapy Placement in alternative living arrangements  PATIENT/FAMILY INVOLVEMENT: This treatment plan has been presented to and reviewed with the patient, Ryan Faulkner, and/or family member.  The patient and family have been given the opportunity to ask questions and make suggestions.  Sharin Mons, RN 07/14/2020, 1:04 PM

## 2020-07-14 NOTE — BHH Group Notes (Signed)
B[facility] Group Notes:  (Nursing/MHT/Case Management/Adjunct)  Date:  07/14/2020  Time:  10:08 PM  Type of Therapy:  Group Therapy  Participation Level:  Did Not Attend  Participation Quality:  Left out of day room before group started like he wasn't aware of what was going on.  Mayra Neer 07/14/2020, 10:08 PM

## 2020-07-14 NOTE — Progress Notes (Signed)
An officer called the unit around [phone] the police . .departmen in [place] stating  will not be able to transfer pt to [facility]  As it was too late and no admittance after 7 pm. Pt  Made aware. He will need disposal / scrub to wear  Prior to transfer.  Pt reassured that staff will get him some paperless scrub before transfer and pt demonstrated good understanding.. Made Dr Leafy Half aware. [facility] on call.

## 2020-07-14 NOTE — BHH Suicide Risk Assessment (Signed)
[facility] Admission Suicide Risk Assessment   Nursing information obtained from:  Patient Demographic factors:  Caucasian,Living alone,Access to firearms Current Mental Status:  NA Loss Factors:  Legal issues,Financial problems / change in socioeconomic status Historical Factors:  Impulsivity Risk Reduction Factors:  Employed  Total Time spent with patient: 1 hour Principal Problem: MDD (major depressive disorder), recurrent severe, without psychosis (HCC) Diagnosis:  Principal Problem:   MDD (major depressive disorder), recurrent severe, without psychosis (HCC) Active Problems:   Alcohol use disorder, severe, dependence (HCC)   Essential hypertension   Tobacco dependence  Subjective Data: 40 year old male with history of bipolar depression and severe alcohol use disorder who presented to outside hospital on January 11 for intentional overdose of unknown amount of Seroquel and Buspar. He has denied suicidal ideation since Jan 13th, and feels the combination of Depakote and Leafy Kindle are working well for his depression. He notes that he has been hospitalized "more times than he can remember" and does not feel this will be helpful for him. He notes he already completed his acute detox from alcohol, and they have already found a new medication for bipolar depression, referring to vraylar. He states he knows he has a lot of work to do on his mental health, and feels he would most benefit from [facility] for his alchohol use disorder. He currently sees a psychiatrist at [facility], and sees a substance abuse counselor once weekly. He notes that he works Friday-Sunday, but if he is not at work he has a hard time doing anything other than drinking to waste time. He feels that having counseling three times a week could help him remain sober. At this time he declines inpatient rehab, [facility], or long-term rehab. His plan is to move into a trailor, continue his job, and do SAIOP with .    Continued Clinical Symptoms:  Alcohol Use Disorder Identification Test Final Score (AUDIT): 20 The "Alcohol Use Disorders Identification Test", Guidelines for Use in Primary Care, Second Edition.  World Science writer [facility]). Score between 0-7:  no or low risk or alcohol related problems. Score between 8-15:  moderate risk of alcohol related problems. Score between 16-19:  high risk of alcohol related problems. Score 20 or above:  warrants further diagnostic evaluation for alcohol dependence and treatment.   CLINICAL FACTORS:   Bipolar Disorder:   Depressive phase Alcohol/Substance Abuse/Dependencies Unstable or Poor Therapeutic Relationship Previous Psychiatric Diagnoses and Treatments   Musculoskeletal: Strength & Muscle Tone: within normal limits Gait & Station: normal Patient leans: N/A  Psychiatric Specialty Exam: Physical Exam Vitals and nursing note reviewed.  Constitutional:      Appearance: Normal appearance.  HENT:     Head: Normocephalic and atraumatic.     Right Ear: External ear normal.     Left Ear: External ear normal.     Nose: Nose normal.     Mouth/Throat:     Mouth: Mucous membranes are moist.     Pharynx: Oropharynx is clear.  Eyes:     Extraocular Movements: Extraocular movements intact.     Conjunctiva/sclera: Conjunctivae normal.     Pupils: Pupils are equal, round, and reactive to light.  Cardiovascular:     Rate and Rhythm: Normal rate.     Pulses: Normal pulses.  Pulmonary:     Effort: Pulmonary effort is normal.     Breath sounds: Normal breath sounds.  Abdominal:     General: Abdomen is flat.     Palpations: Abdomen is soft.  Musculoskeletal:        General: No swelling. Normal range of motion.     Cervical back: Normal range of motion and neck supple.  Skin:    General: Skin is warm and dry.  Neurological:     General: No focal deficit present.     Mental Status: He is alert and oriented to person, place, and time.   Psychiatric:        Attention and Perception: Attention and perception normal.        Mood and Affect: Affect normal. Mood is depressed.        Speech: Speech normal.        Behavior: Behavior is cooperative.        Thought Content: Thought content is not paranoid or delusional. Thought content does not include homicidal or suicidal ideation.        Cognition and Memory: Cognition and memory normal.        Judgment: Judgment is impulsive.     Review of Systems  Constitutional: Negative for appetite change and fatigue.  HENT: Negative for rhinorrhea and sore throat.   Eyes: Negative for photophobia and visual disturbance.  Respiratory: Negative for cough and shortness of breath.   Cardiovascular: Negative for chest pain and palpitations.  Gastrointestinal: Negative for constipation, diarrhea, nausea and vomiting.  Endocrine: Negative for cold intolerance and heat intolerance.  Genitourinary: Negative for difficulty urinating and dysuria.  Musculoskeletal: Negative for arthralgias and myalgias.  Skin: Negative for rash and wound.  Allergic/Immunologic: Negative for food allergies and immunocompromised state.  Neurological: Negative for dizziness and headaches.  Hematological: Negative for adenopathy. Does not bruise/bleed easily.  Psychiatric/Behavioral: Negative for hallucinations, sleep disturbance and suicidal ideas.    Blood pressure 139/87, pulse 85, temperature 98.3 F (36.8 C), temperature source Oral, resp. rate 16, height 5\' 10"  (1.778 m), weight 90.3 kg, SpO2 99 %.Body mass index is 28.55 kg/m.  General Appearance: Well Groomed  Eye Contact:  Good  Speech:  Clear and Coherent  Volume:  Normal  Mood:  Irritable  Affect:  Congruent  Thought Process:  Coherent and Linear  Orientation:  Full (Time, Place, and Person)  Thought Content:  Logical  Suicidal Thoughts:  No  Homicidal Thoughts:  No  Memory:  Immediate;   Fair Recent;   Fair Remote;   Fair  Judgement:  Intact   Insight:  Shallow  Psychomotor Activity:  Normal  Concentration:  Concentration: Fair and Attention Span: Fair  Recall:  of Knowledge:  Fair  Language:  Fair  Akathisia:  Negative  Handed:  Right  AIMS (if indicated):     Assets:  Communication Skills Desire for Improvement Financial Resources/Insurance Intimacy Physical Health Resilience Talents/Skills Transportation Vocational/Educational  ADL's:  Intact  Cognition:  WNL  Sleep:         COGNITIVE FEATURES THAT CONTRIBUTE TO RISK:  None    SUICIDE RISK:   Mild:  Suicidal ideation of limited frequency, intensity, duration, and specificity.  There are no identifiable plans, no associated intent, mild dysphoria and related symptoms, good self-control (both objective and subjective assessment), few other risk factors, and identifiable protective factors, including available and accessible social support.  PLAN OF CARE: 40 year old male with history of bipolar depression and severe alcohol use disorder who presented to outside hospital on January 11 for intentional overdose of unknown amount of Seroquel and Buspar. He has denied suicidal ideation since Jan 13th, and feels the combination of Depakote and 08-10-1985  are working well for his depression. He feels he would benefit from intensive outpatient treatment for alcohol use disorder.   I certify that inpatient services furnished can reasonably be expected to improve the patient's condition.   Jesse Sans, MD 07/14/2020, 1:42 PM

## 2020-07-14 NOTE — BHH Counselor (Signed)
CSW met with the patient at the patient's request.    Patient indicated that he needed clothing for discharge.  He reports that he will be staying with a peer as he continues to work on getting a more secure housing option.  He reports that he needs clothing to go home.  He reports that he will have a peer provide transportation.    He declined PHP referrals.  He requests referral for SAIOP, however, declined referral to [facility].    Assunta Curtis, MSW, LCSW 07/14/2020 2:20 PM

## 2020-07-15 MED ORDER — THIAMINE HCL 100 MG PO TABS: 100.0000 mg | ORAL_TABLET | Freq: Every day | ORAL | 0 refills | Status: AC

## 2020-07-15 MED ORDER — DIVALPROEX SODIUM 250 MG PO DR TAB: 250.0000 mg | DELAYED_RELEASE_TABLET | Freq: Two times a day (BID) | ORAL | 1 refills | Status: DC

## 2020-07-15 MED ORDER — AMLODIPINE BESYLATE 10 MG PO TABS: 10.0000 mg | ORAL_TABLET | Freq: Every day | ORAL | 1 refills | Status: AC

## 2020-07-15 MED ORDER — DOUBLE ANTIBIOTIC 500-10000 UNIT/GM EX OINT
TOPICAL_OINTMENT | Freq: Two times a day (BID) | CUTANEOUS | Status: DC
  Administered 2020-07-15: 1 via TOPICAL
  Filled 2020-07-15 (×2): qty 28.4

## 2020-07-15 MED ORDER — CARIPRAZINE HCL 1.5 MG PO CAPS: 1.5000 mg | ORAL_CAPSULE | Freq: Every day | ORAL | 1 refills | Status: DC

## 2020-07-15 MED ORDER — LISINOPRIL 10 MG PO TABS: 10.0000 mg | ORAL_TABLET | Freq: Every day | ORAL | 1 refills | Status: AC

## 2020-07-15 NOTE — Progress Notes (Signed)
Recreation Therapy Notes  Date: 07/15/2020  Time: 9:30 am   Location: Craft room     Behavioral response: N/A   Intervention Topic: Self-esteem   Discussion/Intervention: Patient did not attend group.   Clinical Observations/Feedback:  Patient did not attend group.   Brittony Billick LRT/CTRS        Treyana Sturgell 07/15/2020 11:36 AM

## 2020-07-15 NOTE — Progress Notes (Signed)
  [facility] Adult Case Management Discharge Plan :  Will you be returning to the same living situation after discharge:  No. At discharge, do you have transportation home?: Yes,  Patient's friend to pick up patient.  Do you have the ability to pay for your medications: Yes,  BCBS PPO  Release of information consent forms completed and in the chart;  Patient's signature needed at discharge.  Patient to Follow up at:  Follow-up Information    [facility] Follow up.   Why: Please present for inperson scheduled appointment on Tuesday Jul 20, 2020 @ 2:10 at the address below.  Contact information: [address] [place] [address] [phone]               Next level of care provider has access to [facility] Link:no  Safety Planning and Suicide Prevention discussed: Yes,  SPE completed with patient, declined consent to reach collateral contact.      Has patient been referred to the Quitline?: Patient refused referral  Patient has been referred for addiction treatment: Yes, Patient reported having an assigned SUD treatment therapist.   Almedia Balls 07/15/2020, 9:42 AM

## 2020-07-15 NOTE — BHH Suicide Risk Assessment (Signed)
[facility] Discharge Suicide Risk Assessment   Principal Problem: Severe bipolar I disorder, most recent episode depressed (HCC) Discharge Diagnoses: Principal Problem:   Severe bipolar I disorder, most recent episode depressed (HCC) Active Problems:   Alcohol use disorder, severe, dependence (HCC)   Essential hypertension   Tobacco dependence   Total Time spent with patient: 30 minutes  Musculoskeletal: Strength & Muscle Tone: within normal limits Gait & Station: normal Patient leans: N/A  Psychiatric Specialty Exam: Review of Systems  Blood pressure (!) 129/94, pulse 87, temperature 98.2 F (36.8 C), temperature source Oral, resp. rate 18, height 5\' 10"  (1.778 m), weight 90.3 kg, SpO2 100 %.Body mass index is 28.55 kg/m.  General Appearance: Well Groomed  Eye Contact::  Good  Speech:  Clear and Coherent and Normal Rate409  Volume:  Normal  Mood:  Euthymic  Affect:  Congruent  Thought Process:  Coherent and Linear  Orientation:  Full (Time, Place, and Person)  Thought Content:  Logical  Suicidal Thoughts:  No  Homicidal Thoughts:  No  Memory:  Immediate;   Fair Recent;   Fair Remote;   Fair  Judgement:  Intact  Insight:  Fair  Psychomotor Activity:  Normal  Concentration:  Fair  Recall:  [id number] of Knowledge:Fair  Language: Fair  Akathisia:  Negative  Handed:  Right  AIMS (if indicated):     Assets:  Communication Skills Desire for Improvement Financial Resources/Insurance Resilience Social Support Talents/Skills Transportation Vocational/Educational  Sleep:  Number of Hours: 7.3  Cognition: WNL  ADL's:  Intact   Mental Status Per Nursing Assessment::   On Admission:  NA  Demographic Factors:  Male and Caucasian  Loss Factors: NA  Historical Factors: Prior suicide attempts and Impulsivity  Risk Reduction Factors:   Employed, Living with another person, especially a relative, Positive social support, Positive therapeutic relationship and Positive  coping skills or problem solving skills  Continued Clinical Symptoms:  Bipolar Disorder:   Depressive phase Alcohol/Substance Abuse/Dependencies Previous Psychiatric Diagnoses and Treatments  Cognitive Features That Contribute To Risk:  None    Suicide Risk:  Mild:  Suicidal ideation of limited frequency, intensity, duration, and specificity.  There are no identifiable plans, no associated intent, mild dysphoria and related symptoms, good self-control (both objective and subjective assessment), few other risk factors, and identifiable protective factors, including available and accessible social support.   Follow-up Information    [facility] Follow up.   Why: Please present for inperson scheduled appointment on Tuesday Jul 20, 2020 @ 2:10 at the address below.  Contact information: [address] [place] [phone]               Plan Of Care/Follow-up recommendations:  Activity:  as tolerated Diet:  low sodium, heart healthy diet  [phone], MD 07/15/2020, 10:08 AM

## 2020-07-15 NOTE — BHH Counselor (Signed)
Adult Comprehensive Assessment  Patient ID: Ryan Faulkner, male   DOB: 10-15-1980, 40 y.o.   MRN: [id number]  Information Source: Information source: Patient  Current Stressors:     Living/Environment/Situation:  Living Arrangements: Alone  Family History:     Childhood History:     Education:     Employment/Work Situation:      Surveyor, quantity Resources:      Alcohol/Substance Abuse:      Social Support System:      Leisure/Recreation:      Strengths/Needs:      Discharge Plan:      Summary/Recommendations:   Summary and Recommendations (to be completed by the evaluator): Patient is a 40 year old male from [place], [place] [facility]   He reports that he Is employed in [employer], [place] by an "[employer]".  He presents to the hospital following being found unresponsive at his hotel room. He has a primary diagnosis of Bipolar I, severe, currently depressed.  Recommendations include: crisis stabilization, therapeutic milieu, encourage group attendance and participation, medication management for detox/mood stabilization and development of comprehensive mental wellness/sobriety plan. Patient is oriented x4, patient was a good historian during assessment. Patient presented with a pleasant mood. Patient reports he intends to follow-up with his psychiatrist and assigned substance use counselor with [facility]. Patient has been medically cleared by attending physician. Patient to be discharged today (07/15/2020).  Corky Crafts. 07/15/2020

## 2020-07-15 NOTE — Progress Notes (Signed)
Patient pleasant and cooperative. Denies any SI, HI, AVH. Medication compliant. Appropriate with staff and peers. Benadryl given for rash. Trazodone for sleep. Pt reports trazodone only keeps him asleep for an hour and is requesting seroquel. Reports that is the only med that works for him with sleep. Pt isolative to self. Did come out for snack and meds. Voiced no concerns. Only complains of frozen shoulder pain when he sleeps at hs.  Encouragement and support provided. Safety checks maintained. Medications given as prescribed. Pt receptive and remains safe on unit with q 15 min checks.

## 2020-07-15 NOTE — BHH Suicide Risk Assessment (Signed)
BHH INPATIENT:  Family/Significant Other Suicide Prevention Education  Suicide Prevention Education:  Patient Refusal for Family/Significant Other Suicide Prevention Education: The patient Ryan Faulkner has refused to provide written consent for family/significant other to be provided Family/Significant Other Suicide Prevention Education during admission and/or prior to discharge.  Physician notified. SPE completed with patient.   Corky Crafts 07/15/2020, 9:34 AM

## 2020-07-15 NOTE — Progress Notes (Signed)
D: Pt A & O X 4. Presents irritable, anxious and demanding on interactions. Denies SI, HI, AVH and pain at this time. Per pt "my only concern is to leave this place".  D/C  as ordered. Picked up in medical mall entrance by "my friend". A: D/C instructions reviewed with pt including prescriptions and follow up appointment; compliance encouraged. All belongings from locker 19 returned to pt at time of departure. Scheduled and PRN medications given with verbal education and effects monitored. Safety checks maintained without incident till time of d/c.  R: Pt receptive to care. Compliant with medications when offered. Denies adverse drug reactions when assessed. Verbalized understanding related to d/c instructions. Reports pain 2/10 when reassessed at 0950.  Ambulatory with a steady gait. Appears to be in no physical distress at time of departure.

## 2020-07-15 NOTE — Plan of Care (Signed)
?  Problem: Activity: ?Goal: Interest or engagement in leisure activities will improve ?Outcome: Progressing ?Goal: Imbalance in normal sleep/wake cycle will improve ?Outcome: Progressing ?  ?Problem: Coping: ?Goal: Coping ability will improve ?Outcome: Progressing ?Goal: Will verbalize feelings ?Outcome: Progressing ?  ?

## 2020-07-15 NOTE — Discharge Summary (Signed)
Physician Discharge Summary Note  Patient:  Ryan Faulkner is an 40 y.o., male MRN:  [id number] DOB:  1981/04/16 Patient phone:  [phone] (home)  Patient address:   [facility] [place] [address],  Total Time spent with patient: 30 minutes  Date of Admission:  07/14/2020 Date of Discharge: 07/15/2020  Reason for Admission:  40 year old male with history of bipolar depression and severe alcohol use disorder who presented to outside hospital on January 11 for intentional overdose of unknown amount of Seroquel and Buspar.   Principal Problem: Severe bipolar I disorder, most recent episode depressed (HCC) Discharge Diagnoses: Principal Problem:   Severe bipolar I disorder, most recent episode depressed (HCC) Active Problems:   Alcohol use disorder, severe, dependence (HCC)   Essential hypertension   Tobacco dependence   Past Psychiatric History: Bipolar I disorder. Currently under the care of [name] at [facility]. He was previously taking Depakote and Wellbutrin, Seroquel and Buspar. He remains disappointed that he was stopped off the Wellbutrin. He reports multiple inpatient admissions, most recently [facility] for alcohol use disorder. He has attempted suicide "too many times to count", in which three have required medical admissions.  Past Medical History:  Past Medical History:  Diagnosis Date  . Alcohol dependence (HCC)   . 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 Hypertension   . Suicidal ideation     Past Surgical History:  Procedure Laterality Date  . TONSILLECTOMY     Family History: History reviewed. No pertinent family history. Family Psychiatric  History: As per patient father diagnosed with" paranoid delusion disorder."  Patient also reports family history maternal and paternal of depression. Social History:  Social History   Substance and Sexual Activity  Alcohol Use Yes  . Alcohol/week: 24.0 standard drinks  . Types: 24  Cans of beer per week     Social History   Substance and Sexual Activity  Drug Use No    Social History   Socioeconomic History  . Marital status: Single    Spouse name: Not on file  . Number of children: Not on file  . Years of education: Not on file  . Highest education level: Not on file  Occupational History  . Not on file  Tobacco Use  . Smoking status: Current Every Day Smoker  . Smokeless tobacco: Never Used  Substance and Sexual Activity  . Alcohol use: Yes    Alcohol/week: 24.0 standard drinks    Types: 24 Cans of beer per week  . Drug use: No  . Sexual activity: Not on file  Other Topics Concern  . Not on file  Social History Narrative  . Not on file   Social Determinants of Health   Financial Resource Strain: Not on file  Food Insecurity: Not on file  Transportation Needs: Not on file  Physical Activity: Not on file  Stress: Not on file  Social Connections: Not on file    Hospital Course:  40 year old male with history of bipolar depression and severe alcohol use disorder who presented to outside hospital on January 11 for intentional overdose of unknown amount of Seroquel and Buspar. He has denied suicidal ideation since Jan 13th, and feels the combination of Depakote and Leafy Kindle are working well for his depression. He notes that he has been hospitalized "more times than he can remember" and does not feel this will be helpful for him. He notes he already completed his acute detox from alcohol, and  they have already found a new medication for bipolar depression, referring to vraylar. He states he knows he has a lot of work to do on his mental health, and feels he would most benefit from [facility] for his alchohol use disorder. He currently sees a psychiatrist at [facility], and sees a substance abuse counselor once weekly. He declined inpatient rehab, [facility], or long-term outpatient rehab. Medications continued without side effects. At time of discharge he  denied suicidal ideations, homicidal ideations, visual hallucinations, and auditory hallucinations.   Physical Findings: AIMS: Facial and Oral Movements Muscles of Facial Expression: None, normal Lips and Perioral Area: None, normal Jaw: None, normal Tongue: None, normal,Extremity Movements Upper (arms, wrists, hands, fingers): None, normal Lower (legs, knees, ankles, toes): None, normal, Trunk Movements Neck, shoulders, hips: None, normal, Overall Severity Severity of abnormal movements (highest score from questions above): None, normal Incapacitation due to abnormal movements: None, normal Patient's awareness of abnormal movements (rate only patient's report): No Awareness, Dental Status Current problems with teeth and/or dentures?: No Does patient usually wear dentures?: No  CIWA:    COWS:     Musculoskeletal: Strength & Muscle Tone: within normal limits Gait & Station: normal Patient leans: N/A  Psychiatric Specialty Exam: Physical Exam Vitals and nursing note reviewed.  Constitutional:      Appearance: Normal appearance.  HENT:     Head: Normocephalic and atraumatic.     Right Ear: External ear normal.     Left Ear: External ear normal.     Nose: Nose normal.     Mouth/Throat:     Mouth: Mucous membranes are moist.     Pharynx: Oropharynx is clear.  Eyes:     Extraocular Movements: Extraocular movements intact.     Conjunctiva/sclera: Conjunctivae normal.     Pupils: Pupils are equal, round, and reactive to light.  Cardiovascular:     Rate and Rhythm: Normal rate and regular rhythm.     Pulses: Normal pulses.     Heart sounds: Normal heart sounds.  Pulmonary:     Effort: Pulmonary effort is normal.     Breath sounds: Normal breath sounds.  Abdominal:     General: Abdomen is flat.     Palpations: Abdomen is soft.  Musculoskeletal:        General: No swelling. Normal range of motion.     Cervical back: Normal range of motion and neck supple.  Skin:     General: Skin is warm and dry.  Neurological:     General: No focal deficit present.     Mental Status: He is alert and oriented to person, place, and time.  Psychiatric:        Mood and Affect: Mood normal.        Behavior: Behavior normal.        Thought Content: Thought content normal.        Judgment: Judgment normal.     Review of Systems  Constitutional: Negative for chills and fatigue.  HENT: Negative for rhinorrhea and sore throat.   Eyes: Negative for photophobia and visual disturbance.  Respiratory: Negative for cough and shortness of breath.   Cardiovascular: Negative for chest pain and palpitations.  Gastrointestinal: Negative for constipation, diarrhea, nausea and vomiting.  Endocrine: Negative for cold intolerance and heat intolerance.  Genitourinary: Negative for difficulty urinating and dysuria.  Musculoskeletal: Negative for arthralgias and myalgias.  Skin: Positive for wound. Negative for rash.  Allergic/Immunologic: Negative for food allergies and immunocompromised state.  Neurological: Negative for dizziness and headaches.  Hematological: Negative for adenopathy. Does not bruise/bleed easily.  Psychiatric/Behavioral: Negative for behavioral problems, dysphoric mood, hallucinations and suicidal ideas.    Blood pressure (!) 129/94, pulse 87, temperature 98.2 F (36.8 C), temperature source Oral, resp. rate 18, height 5\' 10"  (1.778 m), weight 90.3 kg, SpO2 100 %.Body mass index is 28.55 kg/m.  General Appearance: Well Groomed  Eye Contact::  Good  Speech:  Clear and Coherent and Normal Rate409  Volume:  Normal  Mood:  Euthymic  Affect:  Congruent  Thought Process:  Coherent and Linear  Orientation:  Full (Time, Place, and Person)  Thought Content:  Logical  Suicidal Thoughts:  No  Homicidal Thoughts:  No  Memory:  Immediate;   Fair Recent;   Fair Remote;   Fair  Judgement:  Intact  Insight:  Fair  Psychomotor Activity:  Normal  Concentration:  Fair   Recall:  [id number] of Knowledge:Fair  Language: Fair  Akathisia:  Negative  Handed:  Right  AIMS (if indicated):     Assets:  Communication Skills Desire for Improvement Financial Resources/Insurance Resilience Social Support Talents/Skills Transportation Vocational/Educational  Sleep:  Number of Hours: 7.3  Cognition: WNL  ADL's:  Intact           Has this patient used any form of tobacco in the last 30 days? (Cigarettes, Smokeless Tobacco, Cigars, and/or Pipes)  Yes, A prescription for an FDA-approved tobacco cessation medication was offered at discharge and the patient refused  Blood Alcohol level:  Lab Results  Component Value Date   ETH 96 (H) 07/06/2020   ETH 336 (HH) 03/09/2020    Metabolic Disorder Labs:  No results found for: HGBA1C, MPG No results found for: PROLACTIN No results found for: CHOL, TRIG, HDL, CHOLHDL, VLDL, LDLCALC  See Psychiatric Specialty Exam and Suicide Risk Assessment completed by Attending Physician prior to discharge.  Discharge destination:  Other:  To a friend's home  Is patient on multiple antipsychotic therapies at discharge:  No   Has Patient had three or more failed trials of antipsychotic monotherapy by history:  No  Recommended Plan for Multiple Antipsychotic Therapies: NA  Discharge Instructions    Diet - low sodium heart healthy   Complete by: As directed    Increase activity slowly   Complete by: As directed      Allergies as of 07/15/2020      Reactions   Bee Venom Anaphylaxis      Medication List    TAKE these medications     Indication  acetaminophen 325 MG tablet Commonly known as: TYLENOL Take 2 tablets (650 mg total) by mouth every 6 (six) hours as needed for mild pain or moderate pain.  Indication: Pain   amLODipine 10 MG tablet Commonly known as: NORVASC Take 1 tablet (10 mg total) by mouth daily.  Indication: High Blood Pressure Disorder   cariprazine capsule Commonly known as:  VRAYLAR Take 1 capsule (1.5 mg total) by mouth daily.  Indication: Depressive Phase of Manic-Depression, Manic Phase of Manic-Depression   divalproex 250 MG DR tablet Commonly known as: DEPAKOTE Take 1 tablet (250 mg total) by mouth every 12 (twelve) hours.  Indication: Depressive Phase of Manic-Depression   EPINEPHrine 0.3 mg/0.3 mL Soaj injection Commonly known as: EPI-PEN Inject 0.3 mg into the muscle as needed for anaphylaxis.  Indication: Life-Threatening Hypersensitivity Reaction   lisinopril 10 MG tablet Commonly known as: ZESTRIL Take 1 tablet (10 mg total) by mouth  daily.  Indication: High Blood Pressure Disorder   naproxen 500 MG tablet Commonly known as: Naprosyn Take 1 tablet (500 mg total) by mouth 2 (two) times daily with a meal.  Indication: Pain   thiamine 100 MG tablet Take 1 tablet (100 mg total) by mouth daily.  Indication: Deficiency of Vitamin B1       Follow-up Information    [facility] Follow up.   Why: Please present for inperson scheduled appointment on Tuesday Jul 20, 2020 @ 2:10 at the address below.  Contact information: [address] [place] [address] [phone]               Follow-up recommendations:  Activity:  as tolerated Diet:  low sodium, heart healthy diet  Comments:  30-day [facility] [address] per patient request  Signed: Jesse SansMegan M Freeman, MD 07/15/2020, 10:16 AM

## 2020-10-19 ENCOUNTER — Encounter (HOSPITAL_COMMUNITY): Payer: Self-pay

## 2020-10-19 ENCOUNTER — Emergency Department (HOSPITAL_COMMUNITY): Payer: BC Managed Care – PPO

## 2020-10-19 ENCOUNTER — Inpatient Hospital Stay (HOSPITAL_COMMUNITY)
Admission: EM | Admit: 2020-10-19 | Discharge: 2020-10-23 | DRG: 917 | Disposition: A | Discharge status: Other Acute Inpatient Hospital | Payer: BC Managed Care – PPO | Attending: Internal Medicine | Admitting: Internal Medicine

## 2020-10-19 DIAGNOSIS — Z791 Long term (current) use of non-steroidal anti-inflammatories (NSAID): Secondary | ICD-10-CM | POA: Diagnosis not present

## 2020-10-19 DIAGNOSIS — R7989 Other specified abnormal findings of blood chemistry: Secondary | ICD-10-CM | POA: Diagnosis present

## 2020-10-19 DIAGNOSIS — F17200 Nicotine dependence, unspecified, uncomplicated: Secondary | ICD-10-CM | POA: Diagnosis present

## 2020-10-19 DIAGNOSIS — I959 Hypotension, unspecified: Secondary | ICD-10-CM | POA: Diagnosis not present

## 2020-10-19 DIAGNOSIS — Z87898 Personal history of other specified conditions: Secondary | ICD-10-CM

## 2020-10-19 DIAGNOSIS — T50992A Poisoning by other drugs, medicaments and biological substances, intentional self-harm, initial encounter: Secondary | ICD-10-CM | POA: Diagnosis present

## 2020-10-19 DIAGNOSIS — I10 Essential (primary) hypertension: Secondary | ICD-10-CM | POA: Diagnosis present

## 2020-10-19 DIAGNOSIS — R7401 Elevation of levels of liver transaminase levels: Secondary | ICD-10-CM | POA: Diagnosis present

## 2020-10-19 DIAGNOSIS — Z91030 Bee allergy status: Secondary | ICD-10-CM | POA: Diagnosis not present

## 2020-10-19 DIAGNOSIS — T43592A Poisoning by other antipsychotics and neuroleptics, intentional self-harm, initial encounter: Principal | ICD-10-CM | POA: Diagnosis present

## 2020-10-19 DIAGNOSIS — Y908 Blood alcohol level of 240 mg/100 ml or more: Secondary | ICD-10-CM | POA: Diagnosis present

## 2020-10-19 DIAGNOSIS — T50902A Poisoning by unspecified drugs, medicaments and biological substances, intentional self-harm, initial encounter: Principal | ICD-10-CM

## 2020-10-19 DIAGNOSIS — T68XXXA Hypothermia, initial encounter: Secondary | ICD-10-CM | POA: Diagnosis not present

## 2020-10-19 DIAGNOSIS — Z20822 Contact with and (suspected) exposure to covid-19: Secondary | ICD-10-CM | POA: Diagnosis present

## 2020-10-19 DIAGNOSIS — F319 Bipolar disorder, unspecified: Secondary | ICD-10-CM | POA: Diagnosis present

## 2020-10-19 DIAGNOSIS — G929 Unspecified toxic encephalopathy: Secondary | ICD-10-CM | POA: Diagnosis present

## 2020-10-19 DIAGNOSIS — F314 Bipolar disorder, current episode depressed, severe, without psychotic features: Secondary | ICD-10-CM | POA: Diagnosis present

## 2020-10-19 DIAGNOSIS — T510X2A Toxic effect of ethanol, intentional self-harm, initial encounter: Secondary | ICD-10-CM | POA: Diagnosis present

## 2020-10-19 DIAGNOSIS — Z978 Presence of other specified devices: Secondary | ICD-10-CM | POA: Diagnosis not present

## 2020-10-19 DIAGNOSIS — R680 Hypothermia, not associated with low environmental temperature: Secondary | ICD-10-CM | POA: Diagnosis present

## 2020-10-19 DIAGNOSIS — T50901A Poisoning by unspecified drugs, medicaments and biological substances, accidental (unintentional), initial encounter: Secondary | ICD-10-CM | POA: Diagnosis present

## 2020-10-19 DIAGNOSIS — Z781 Physical restraint status: Secondary | ICD-10-CM | POA: Diagnosis not present

## 2020-10-19 DIAGNOSIS — F1020 Alcohol dependence, uncomplicated: Secondary | ICD-10-CM | POA: Diagnosis present

## 2020-10-19 DIAGNOSIS — Z79899 Other long term (current) drug therapy: Secondary | ICD-10-CM

## 2020-10-19 DIAGNOSIS — J9600 Acute respiratory failure, unspecified whether with hypoxia or hypercapnia: Secondary | ICD-10-CM | POA: Diagnosis present

## 2020-10-19 DIAGNOSIS — T391X1A Poisoning by 4-Aminophenol derivatives, accidental (unintentional), initial encounter: Secondary | ICD-10-CM | POA: Diagnosis present

## 2020-10-19 LAB — URINALYSIS, ROUTINE W REFLEX MICROSCOPIC
Bacteria, UA: NONE SEEN
Bilirubin Urine: NEGATIVE
Glucose, UA: NEGATIVE mg/dL
Ketones, ur: NEGATIVE mg/dL
Leukocytes,Ua: NEGATIVE
Nitrite: NEGATIVE
Protein, ur: NEGATIVE mg/dL
Specific Gravity, Urine: 1.003 — ABNORMAL LOW (ref 1.005–1.030)
pH: 5 (ref 5.0–8.0)

## 2020-10-19 LAB — BASIC METABOLIC PANEL
Anion gap: 14 (ref 5–15)
BUN: 6 mg/dL (ref 6–20)
CO2: 20 mmol/L — ABNORMAL LOW (ref 22–32)
Calcium: 7.6 mg/dL — ABNORMAL LOW (ref 8.9–10.3)
Chloride: 104 mmol/L (ref 98–111)
Creatinine, Ser: 0.58 mg/dL — ABNORMAL LOW (ref 0.61–1.24)
GFR, Estimated: >60 mL/min (ref >60)
Glucose, Bld: 111 mg/dL — ABNORMAL HIGH (ref 70–99)
Potassium: 3.8 mmol/L (ref 3.5–5.1)
Sodium: 138 mmol/L (ref 135–145)

## 2020-10-19 LAB — CBC WITH DIFFERENTIAL/PLATELET
Abs Immature Granulocytes: 0.04 10*3/uL (ref 0.00–0.07)
Basophils Absolute: 0.1 10*3/uL (ref 0.0–0.1)
Basophils Relative: 1 %
Eosinophils Absolute: 0.3 10*3/uL (ref 0.0–0.5)
Eosinophils Relative: 3 %
HCT: 50.3 % (ref 39.0–52.0)
Hemoglobin: 16.8 g/dL (ref 13.0–17.0)
Immature Granulocytes: 1 %
Lymphocytes Relative: 37 %
Lymphs Abs: 3.1 10*3/uL (ref 0.7–4.0)
MCH: 32.2 pg (ref 26.0–34.0)
MCHC: 33.4 g/dL (ref 30.0–36.0)
MCV: 96.5 fL (ref 80.0–100.0)
Monocytes Absolute: 0.8 10*3/uL (ref 0.1–1.0)
Monocytes Relative: 9 %
Neutro Abs: 4.1 10*3/uL (ref 1.7–7.7)
Neutrophils Relative %: 49 %
Platelets: 200 10*3/uL (ref 150–400)
RBC: 5.21 MIL/uL (ref 4.22–5.81)
RDW: 12.4 % (ref 11.5–15.5)
WBC: 8.3 10*3/uL (ref 4.0–10.5)
nRBC: 0 % (ref 0.0–0.2)

## 2020-10-19 LAB — COMPREHENSIVE METABOLIC PANEL
ALT: 179 U/L — ABNORMAL HIGH (ref 0–44)
AST: 127 U/L — ABNORMAL HIGH (ref 15–41)
Albumin: 4.1 g/dL (ref 3.5–5.0)
Alkaline Phosphatase: 78 U/L (ref 38–126)
Anion gap: 11 (ref 5–15)
BUN: 5 mg/dL — ABNORMAL LOW (ref 6–20)
CO2: 22 mmol/L (ref 22–32)
Calcium: 8.5 mg/dL — ABNORMAL LOW (ref 8.9–10.3)
Chloride: 102 mmol/L (ref 98–111)
Creatinine, Ser: 0.67 mg/dL (ref 0.61–1.24)
GFR, Estimated: >60 mL/min (ref >60)
Glucose, Bld: 119 mg/dL — ABNORMAL HIGH (ref 70–99)
Potassium: 3.8 mmol/L (ref 3.5–5.1)
Sodium: 135 mmol/L (ref 135–145)
Total Bilirubin: 0.6 mg/dL (ref 0.3–1.2)
Total Protein: 7.3 g/dL (ref 6.5–8.1)

## 2020-10-19 LAB — RAPID URINE DRUG SCREEN, HOSP PERFORMED
Amphetamines: NOT DETECTED
Barbiturates: NOT DETECTED
Benzodiazepines: NOT DETECTED
Cocaine: NOT DETECTED
Opiates: NOT DETECTED
Tetrahydrocannabinol: NOT DETECTED

## 2020-10-19 LAB — BLOOD GAS, ARTERIAL
Acid-base deficit: 2.6 mmol/L — ABNORMAL HIGH (ref 0.0–2.0)
Bicarbonate: 21.5 mmol/L (ref 20.0–28.0)
Drawn by: 27733
FIO2: 60
MECHVT: 540 mL
O2 Saturation: 97.8 %
PEEP: 5 cmH2O
Patient temperature: 35.2
RATE: 15 resp/min
pCO2 arterial: 45.5 mmHg (ref 32.0–48.0)
pH, Arterial: 7.313 — ABNORMAL LOW (ref 7.350–7.450)
pO2, Arterial: 126 mmHg — ABNORMAL HIGH (ref 83.0–108.0)

## 2020-10-19 LAB — PROTIME-INR
INR: 1 (ref 0.8–1.2)
Prothrombin Time: 13.2 seconds (ref 11.4–15.2)

## 2020-10-19 LAB — LACTIC ACID, PLASMA
Lactic Acid, Venous: 2 mmol/L (ref 0.5–1.9)
Lactic Acid, Venous: 2.1 mmol/L (ref 0.5–1.9)

## 2020-10-19 LAB — RESP PANEL BY RT-PCR (FLU A&B, COVID) ARPGX2
Influenza A by PCR: NEGATIVE
Influenza B by PCR: NEGATIVE
SARS Coronavirus 2 by RT PCR: NEGATIVE

## 2020-10-19 LAB — CBG MONITORING, ED: Glucose-Capillary: 122 mg/dL — ABNORMAL HIGH (ref 70–99)

## 2020-10-19 LAB — VALPROIC ACID LEVEL: Valproic Acid Lvl: <10 ug/mL — ABNORMAL LOW (ref 50.0–100.0)

## 2020-10-19 LAB — PHOSPHORUS: Phosphorus: 4.4 mg/dL (ref 2.5–4.6)

## 2020-10-19 LAB — MAGNESIUM: Magnesium: 1.8 mg/dL (ref 1.7–2.4)

## 2020-10-19 LAB — ETHANOL: Alcohol, Ethyl (B): 351 mg/dL (ref <10)

## 2020-10-19 LAB — ACETAMINOPHEN LEVEL: Acetaminophen (Tylenol), Serum: <10 ug/mL — ABNORMAL LOW (ref 10–30)

## 2020-10-19 LAB — APTT: aPTT: 29 seconds (ref 24–36)

## 2020-10-19 MED ORDER — NALOXONE HCL 2 MG/2ML IJ SOSY
4.0000 mg | PREFILLED_SYRINGE | Freq: Once | INTRAMUSCULAR | Status: AC
  Administered 2020-10-19: 4 mg via INTRAVENOUS
  Filled 2020-10-19: qty 4

## 2020-10-19 MED ORDER — CHARCOAL ACTIVATED PO LIQD
100.0000 g | Freq: Once | ORAL | Status: AC
  Administered 2020-10-19: 100 g
  Filled 2020-10-19: qty 480

## 2020-10-19 MED ORDER — SODIUM CHLORIDE 0.9 % IV SOLN
250.0000 mL | INTRAVENOUS | Status: DC
  Administered 2020-10-20: 250 mL via INTRAVENOUS

## 2020-10-19 MED ORDER — DEXMEDETOMIDINE HCL IN NACL 400 MCG/100ML IV SOLN
0.4000 ug/kg/h | INTRAVENOUS | Status: DC
  Administered 2020-10-19: 0.4 ug/kg/h via INTRAVENOUS
  Filled 2020-10-19: qty 100

## 2020-10-19 MED ORDER — LACTATED RINGERS IV BOLUS
500.0000 mL | Freq: Once | INTRAVENOUS | Status: AC
  Administered 2020-10-20: 500 mL via INTRAVENOUS

## 2020-10-19 MED ORDER — ONDANSETRON HCL 4 MG/2ML IJ SOLN
4.0000 mg | Freq: Four times a day (QID) | INTRAMUSCULAR | Status: DC | PRN
  Administered 2020-10-20: 4 mg via INTRAVENOUS
  Filled 2020-10-19: qty 2

## 2020-10-19 MED ORDER — POLYETHYLENE GLYCOL 3350 17 G PO PACK
17.0000 g | PACK | Freq: Every day | ORAL | Status: DC
  Administered 2020-10-20: 17 g
  Filled 2020-10-19: qty 1

## 2020-10-19 MED ORDER — DOCUSATE SODIUM 50 MG/5ML PO LIQD
100.0000 mg | Freq: Two times a day (BID) | ORAL | Status: DC
  Administered 2020-10-20 (×2): 100 mg
  Filled 2020-10-19 (×5): qty 10

## 2020-10-19 MED ORDER — LORAZEPAM 2 MG/ML IJ SOLN
2.0000 mg | Freq: Once | INTRAMUSCULAR | Status: AC
  Administered 2020-10-19: 2 mg via INTRAVENOUS
  Filled 2020-10-19: qty 1

## 2020-10-19 MED ORDER — NALOXONE HCL 2 MG/2ML IJ SOSY
2.0000 mg | PREFILLED_SYRINGE | Freq: Once | INTRAMUSCULAR | Status: AC
  Administered 2020-10-19: 2 mg via INTRAVENOUS
  Filled 2020-10-19: qty 2

## 2020-10-19 MED ORDER — HEPARIN SODIUM (PORCINE) 5000 UNIT/ML IJ SOLN
5000.0000 [IU] | Freq: Three times a day (TID) | INTRAMUSCULAR | Status: DC
  Administered 2020-10-19 – 2020-10-20 (×2): 5000 [IU] via SUBCUTANEOUS
  Filled 2020-10-19 (×2): qty 1

## 2020-10-19 MED ORDER — ETOMIDATE 2 MG/ML IV SOLN
20.0000 mg | Freq: Once | INTRAVENOUS | Status: AC
  Administered 2020-10-19: 20 mg via INTRAVENOUS

## 2020-10-19 MED ORDER — ONDANSETRON HCL 4 MG PO TABS: 4.0000 mg | ORAL_TABLET | Freq: Four times a day (QID) | ORAL | Status: DC | PRN

## 2020-10-19 MED ORDER — POTASSIUM CHLORIDE 10 MEQ/100ML IV SOLN
10.0000 meq | INTRAVENOUS | Status: AC
  Administered 2020-10-19 – 2020-10-20 (×2): 10 meq via INTRAVENOUS
  Filled 2020-10-19 (×2): qty 100

## 2020-10-19 MED ORDER — HYDRALAZINE HCL 20 MG/ML IJ SOLN
10.0000 mg | Freq: Four times a day (QID) | INTRAMUSCULAR | Status: DC | PRN
  Administered 2020-10-20: 10 mg via INTRAVENOUS
  Filled 2020-10-19: qty 1

## 2020-10-19 MED ORDER — LORAZEPAM 2 MG/ML IJ SOLN: 2.0000 mg | INTRAMUSCULAR | Status: DC | PRN

## 2020-10-19 MED ORDER — NOREPINEPHRINE 4 MG/250ML-% IV SOLN
2.0000 ug/min | INTRAVENOUS | Status: DC
  Administered 2020-10-20: 2 ug/min via INTRAVENOUS
  Filled 2020-10-19: qty 250

## 2020-10-19 MED ORDER — SUCCINYLCHOLINE CHLORIDE 20 MG/ML IJ SOLN
120.0000 mg | Freq: Once | INTRAMUSCULAR | Status: AC
  Administered 2020-10-19: 120 mg via INTRAVENOUS

## 2020-10-19 MED ORDER — MAGNESIUM SULFATE 2 GM/50ML IV SOLN
2.0000 g | Freq: Once | INTRAVENOUS | Status: AC
  Administered 2020-10-19: 2 g via INTRAVENOUS
  Filled 2020-10-19: qty 50

## 2020-10-19 MED ORDER — PROPOFOL 1000 MG/100ML IV EMUL
0.0000 ug/kg/min | INTRAVENOUS | Status: DC
  Administered 2020-10-20: 10 ug/kg/min via INTRAVENOUS
  Administered 2020-10-20: 15 ug/kg/min via INTRAVENOUS
  Filled 2020-10-19 (×2): qty 100

## 2020-10-19 MED ORDER — PROPOFOL 1000 MG/100ML IV EMUL
5.0000 ug/kg/min | INTRAVENOUS | Status: DC
  Administered 2020-10-19: 5 ug/kg/min via INTRAVENOUS
  Filled 2020-10-19: qty 100

## 2020-10-19 MED ORDER — SODIUM CHLORIDE 0.9 % IV SOLN: INTRAVENOUS | Status: DC

## 2020-10-19 MED ORDER — SODIUM CHLORIDE 0.9 % IV BOLUS
1000.0000 mL | Freq: Once | INTRAVENOUS | Status: AC
  Administered 2020-10-19: 1000 mL via INTRAVENOUS

## 2020-10-19 MED ORDER — LACTATED RINGERS IV BOLUS
1000.0000 mL | Freq: Once | INTRAVENOUS | Status: AC
  Administered 2020-10-19: 1000 mL via INTRAVENOUS

## 2020-10-19 NOTE — Progress Notes (Signed)
Pt transferred for ED 2 to ICU 7 without complication and on 100%, care turn over to [facility]

## 2020-10-19 NOTE — ED Notes (Signed)
[place] [employer] at bedside, states was called out for overdose from girlfriend. Pt had admitted to taking Kratom, drinking beer and taking Seroquel. Pt was uncooperative at the house and not wanting to come to ED for evaluation, [employer] was going to bring pt here for emergency commitment and went to place cuff on patient, patient went unresponsive.

## 2020-10-19 NOTE — H&P (Signed)
TRH H&P    Patient Demographics:    Ryan Faulkner, is a 40 y.o. male  MRN: [id number]  DOB - [birth year]  Admit Date - 10/19/2020  Referring MD/NP/PA: Allena KatzPatel  Outpatient Primary MD for the patient is Pcp, No  Patient coming from: Home  Chief complaint- Overdose   HPI:    Ryan GreenerBrandon Cortright  is a [age] y.o. male, with a chief complaint of suicidal ideation, hypertension, alcohol dependence, bipolar disorder, and more presents the ED with a chief complaint of overdose.  Patient reportedly took 2 g of Seroquel, an unknown amount of Kratom, and drinking unknown amount of alcohol before calling EMS for an overdose.  Apparently when EMS arrived at the scene patient was able to describe to them exactly what he took.  By the time he arrived in the ER he was unresponsive with agonal breathing.  6 mg of Narcan was given without any response.  Patient was intubated.  Poison control was called and advised activated charcoal, monitor QT, and that patient may have seizures.  Unfortunately due to patient intubated status he is not able to give any history at this time.  In the ED Temp 94.8-96.3, heart rate 118, respiratory rate 13, satting 98% on the ventilator No leukocytosis with a white blood cell count of 8.3, hemoglobin 16.8 Chemistry panel reveals a decreased bicarb at 22 and a transaminitis with an AST of 127 and ALT of 179 Respiratory panel negative UA is not indicative of UTI but does have hematuria, urine culture pending Valproic acid levels less than 10, alcohol level 351, UDS negative EKG shows a heart rate of 128, sinus tach, QTC 464 Chest x-ray shows appropriate placement of ETT and NG as well as low lung volumes with bibasilar atelectasis Admission requested for further management of overdose    Review of systems:    Patient is unable to provide review of systems secondary to acute metabolic encephalopathy and  intubation     Past History of the following :    Past Medical History:  Diagnosis Date  . Alcohol dependence (HCC)   . 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 Hypertension   . Suicidal ideation       Past Surgical History:  Procedure Laterality Date  . TONSILLECTOMY        Social History:      Social History   Tobacco Use  . Smoking status: Current Every Day Smoker  . Smokeless tobacco: Never Used  Substance Use Topics  . Alcohol use: Yes    Alcohol/week: 24.0 standard drinks    Types: 24 Cans of beer per week       Family History :    No family history on file. Family history could not be reviewed secondary to patient's intubated status   Home Medications:   Prior to Admission medications   Medication Sig Start Date End Date Taking? Authorizing Provider  QUEtiapine (SEROQUEL) 200 MG tablet Take 200 mg by mouth at bedtime. 10/11/20  Yes [provider]  acetaminophen (TYLENOL) 325 MG tablet Take 2 tablets (650 mg total) by mouth every 6 (six) hours as needed for mild pain or moderate pain. 07/12/20   Arrien, York Ram, MD  amLODipine (NORVASC) 10 MG tablet Take 1 tablet (10 mg total) by mouth daily. 07/15/20   Jesse Sans, MD  cariprazine [facility]) capsule Take 1 capsule (1.5 mg total) by mouth daily. 07/15/20   Jesse Sans, MD  divalproex (DEPAKOTE) 250 MG DR tablet Take 1 tablet (250 mg total) by mouth every 12 (twelve) hours. 07/15/20   Jesse Sans, MD  EPINEPHrine 0.3 mg/0.3 mL IJ SOAJ injection Inject 0.3 mg into the muscle as needed for anaphylaxis.    [provider]  lisinopril (ZESTRIL) 10 MG tablet Take 1 tablet (10 mg total) by mouth daily. 07/15/20   Jesse Sans, MD  naproxen (NAPROSYN) 500 MG tablet Take 1 tablet (500 mg total) by mouth 2 (two) times daily with a meal. 12/09/19   Joni Reining, PA-C  predniSONE (STERAPRED UNI-PAK 48 TAB) 5 MG (48) TBPK tablet See admin instructions. follow  package directions 07/28/20   [provider]  sulfamethoxazole-trimethoprim (BACTRIM DS) 800-160 MG tablet Take 1 tablet by mouth 2 (two) times daily. 07/28/20   [provider]  triamcinolone cream (KENALOG) 0.1 % Apply topically 2 (two) times daily. 07/28/20   [provider]     Allergies:     Allergies  Allergen Reactions  . Bee Venom Anaphylaxis     Physical Exam:   Vitals  Blood pressure 102/68, pulse 97, temperature (!) 95.7 F (35.4 C), temperature source Bladder, resp. rate 17, height 5\' 8"  (1.727 m), weight 99.8 kg, SpO2 100 %.  1.  General: Patient is supine in bed on mechanical ventilation  2. Psychiatric: Cannot be assessed secondary to patient's intubated status  3. Neurologic: Not following commands secondary to sedation  4. HEENMT:  Head is atraumatic, normocephalic, pupils are reactive, neck is supple, trachea is midline  5. Respiratory : Lungs are clear to auscultation bilaterally without wheezes, rales, rhonchi, no clubbing, no cyanosis  6. Cardiovascular : Heart rate is slightly tachycardic, rhythm is regular, no murmurs rubs or gallops  7. Gastrointestinal:  Abdomen soft, nondistended, no palpable masses, bowel sounds active  8. Skin:  Skin is warm dry and intact without acute lesion on limited exam  9.Musculoskeletal:  No acute deformity, peripheral pulses palpated, no peripheral edema    Data Review:    CBC Recent Labs  Lab 10/19/20 1628  WBC 8.3  HGB 16.8  HCT 50.3  PLT 200  MCV 96.5  MCH 32.2  MCHC 33.4  RDW 12.4  LYMPHSABS 3.1  MONOABS 0.8  EOSABS 0.3  BASOSABS 0.1   ------------------------------------------------------------------------------------------------------------------  Results for orders placed or performed during the hospital encounter of 10/19/20 (from the past 48 hour(s))  Comprehensive metabolic panel     Status: Abnormal   Collection Time: 10/19/20  4:28 PM  Result Value Ref [place]    Sodium 135 135 - 145 mmol/L   Potassium 3.8 3.5 - 5.1 mmol/L   Chloride 102 98 - 111 mmol/L   CO2 22 22 - 32 mmol/L   Glucose, Bld 119 (H) 70 - 99 mg/dL    Comment: Glucose reference range applies only to samples taken after fasting for at least 8 hours.   BUN 5 (L) 6 - 20 mg/dL   Creatinine, Ser 10/21/20 0.61 - 1.24 mg/dL   Calcium 8.5 (  L) 8.9 - 10.3 mg/dL   Total Protein 7.3 6.5 - 8.1 g/dL   Albumin 4.1 3.5 - 5.0 g/dL   AST 767 (H) 15 - 41 U/L   ALT 179 (H) 0 - 44 U/L   Alkaline Phosphatase 78 38 - 126 U/L   Total Bilirubin 0.6 0.3 - 1.2 mg/dL   GFR, Estimated >34 >19 mL/min    Comment: (NOTE) Calculated using the CKD-EPI Creatinine Equation (2021)    Anion gap 11 5 - 15    Comment: Performed at [facility], [address], [place], [place] [address]  CBC WITH DIFFERENTIAL     Status: None   Collection Time: 10/19/20  4:28 PM  Result Value Ref [place]   WBC 8.3 4.0 - 10.5 K/uL   RBC 5.21 4.22 - 5.81 MIL/uL   Hemoglobin 16.8 13.0 - 17.0 g/dL   HCT 40.9 73.5 - 32.9 %   MCV 96.5 80.0 - 100.0 fL   MCH 32.2 26.0 - 34.0 pg   MCHC 33.4 30.0 - 36.0 g/dL   RDW 92.4 26.8 - 34.1 %   Platelets 200 150 - 400 K/uL   nRBC 0.0 0.0 - 0.2 %   Neutrophils Relative % 49 %   Neutro Abs 4.1 1.7 - 7.7 K/uL   Lymphocytes Relative 37 %   Lymphs Abs 3.1 0.7 - 4.0 K/uL   Monocytes Relative 9 %   Monocytes Absolute 0.8 0.1 - 1.0 K/uL   Eosinophils Relative 3 %   Eosinophils Absolute 0.3 0.0 - 0.5 K/uL   Basophils Relative 1 %   Basophils Absolute 0.1 0.0 - 0.1 K/uL   Immature Granulocytes 1 %   Abs Immature Granulocytes 0.04 0.00 - 0.07 K/uL    Comment: Performed at [facility], [address] [place], [place] [address]  Protime-INR     Status: None   Collection Time: 10/19/20  4:28 PM  Result Value Ref [place]   Prothrombin Time 13.2 11.4 - 15.2 seconds   INR 1.0 0.8 - 1.2    Comment: (NOTE) INR goal varies based on device and disease states. Performed at [facility], [address],  [place], [place] [address]   APTT     Status: None   Collection Time: 10/19/20  4:28 PM  Result Value Ref [place]   aPTT 29 24 - 36 seconds    Comment: Performed at [facility], [address] [place], [place] [address]  Urinalysis, Routine w reflex microscopic     Status: Abnormal   Collection Time: 10/19/20  4:28 PM  Result Value Ref [place]   Color, Urine STRAW (A) YELLOW   APPearance CLEAR CLEAR   Specific Gravity, Urine 1.003 (L) 1.005 - 1.030   pH 5.0 5.0 - 8.0   Glucose, UA NEGATIVE NEGATIVE mg/dL   Hgb urine dipstick SMALL (A) NEGATIVE   Bilirubin Urine NEGATIVE NEGATIVE   Ketones, ur NEGATIVE NEGATIVE mg/dL   Protein, ur NEGATIVE NEGATIVE mg/dL   Nitrite NEGATIVE NEGATIVE   Leukocytes,Ua NEGATIVE NEGATIVE   RBC / HPF 0-5 0 - 5 RBC/hpf   WBC, UA 0-5 0 - 5 WBC/hpf   Bacteria, UA NONE SEEN NONE SEEN   Squamous Epithelial / LPF 0-5 0 - 5   Hyaline Casts, UA PRESENT     Comment: Performed at [facility], [address], [place], [place] [address]  Rapid urine drug screen (hospital performed)     Status: None   Collection Time: 10/19/20  4:28 PM  Result  Value Ref [place]   Opiates NONE DETECTED NONE DETECTED   Cocaine NONE DETECTED NONE DETECTED   Benzodiazepines NONE DETECTED NONE DETECTED   Amphetamines NONE DETECTED NONE DETECTED   Tetrahydrocannabinol NONE DETECTED NONE DETECTED   Barbiturates NONE DETECTED NONE DETECTED    Comment: (NOTE) DRUG SCREEN FOR MEDICAL PURPOSES ONLY.  IF CONFIRMATION IS NEEDED FOR ANY PURPOSE, NOTIFY LAB WITHIN 5 DAYS.  LOWEST DETECTABLE LIMITS FOR URINE DRUG SCREEN Drug Class                     Cutoff (ng/mL) Amphetamine and metabolites    1000 Barbiturate and metabolites    200 Benzodiazepine                 200 Tricyclics and metabolites     300 Opiates and metabolites        300 Cocaine and metabolites        300 THC                            50 Performed at [facility], [address], [place], [place] [address]   Ethanol      Status: Abnormal   Collection Time: 10/19/20  4:28 PM  Result Value Ref [place]   Alcohol, Ethyl (B) 351 (HH) <10 mg/dL    Comment: CRITICAL RESULT CALLED TO, READ BACK BY AND VERIFIED WITH: ZIFER,M ON 10/19/20 AT 1735 BY LOY,C (NOTE) Lowest detectable limit for serum alcohol is 10 mg/dL.  For medical purposes only. Performed at [facility], [address] [place], [place] [address]   Valproic acid level     Status: Abnormal   Collection Time: 10/19/20  4:28 PM  Result Value Ref [place]   Valproic Acid Lvl <10 (L) 50.0 - 100.0 ug/mL    Comment: RESULTS CONFIRMED BY MANUAL DILUTION Performed at [facility], [address] [place], [place] [address]   CBG monitoring, ED     Status: Abnormal   Collection Time: 10/19/20  4:29 PM  Result Value Ref [place]   Glucose-Capillary 122 (H) 70 - 99 mg/dL    Comment: Glucose reference range applies only to samples taken after fasting for at least 8 hours.  Lactic acid, plasma     Status: Abnormal   Collection Time: 10/19/20  4:36 PM  Result Value Ref [place]   Lactic Acid, Venous 2.0 (HH) 0.5 - 1.9 mmol/L    Comment: CRITICAL RESULT CALLED TO, READ BACK BY AND VERIFIED WITH: MYRICK,B ON 10/19/20 AT 1720 BY LOY,C Performed at [facility], [address], [place], [place] [address]   Resp Panel by RT-PCR (Flu A&B, Covid) Nasopharyngeal Swab     Status: None   Collection Time: 10/19/20  4:36 PM   Specimen: Nasopharyngeal Swab; Nasopharyngeal(NP) swabs in vial transport medium  Result Value Ref [place]   SARS Coronavirus 2 by RT PCR NEGATIVE NEGATIVE    Comment: (NOTE) SARS-CoV-2 target nucleic acids are NOT DETECTED.  The SARS-CoV-2 RNA is generally detectable in upper respiratory specimens during the acute phase of infection. The lowest concentration of SARS-CoV-2 viral copies this assay can detect is 138 copies/mL. A negative result does not preclude SARS-Cov-2 infection and should not be used as the sole basis for treatment or other patient  management decisions. A negative result may occur with  improper specimen collection/handling, submission of specimen other than nasopharyngeal swab, presence of viral mutation(s) within the areas targeted by  this assay, and inadequate number of viral copies(<138 copies/mL). A negative result must be combined with clinical observations, patient history, and epidemiological information. The expected result is Negative.  Fact Sheet for Patients:  BloggerCourse.com  Fact Sheet for Healthcare Providers:  SeriousBroker.it  This test is no t yet approved or cleared by the [place] FDA and  has been authorized for detection and/or diagnosis of SARS-CoV-2 by FDA under an Emergency Use Authorization (EUA). This EUA will remain  in effect (meaning this test can be used) for the duration of the COVID-19 declaration under Section 564(b)(1) of the Act, 21 U.S.C.section 360bbb-3(b)(1), unless the authorization is terminated  or revoked sooner.       Influenza A by PCR NEGATIVE NEGATIVE   Influenza B by PCR NEGATIVE NEGATIVE    Comment: (NOTE) The Xpert Xpress SARS-CoV-2/FLU/RSV plus assay is intended as an aid in the diagnosis of influenza from Nasopharyngeal swab specimens and should not be used as a sole basis for treatment. Nasal washings and aspirates are unacceptable for Xpert Xpress SARS-CoV-2/FLU/RSV testing.  Fact Sheet for Patients: BloggerCourse.com  Fact Sheet for Healthcare Providers: SeriousBroker.it  This test is not yet approved or cleared by the [place] FDA and has been authorized for detection and/or diagnosis of SARS-CoV-2 by FDA under an Emergency Use Authorization (EUA). This EUA will remain in effect (meaning this test can be used) for the duration of the COVID-19 declaration under Section 564(b)(1) of the Act, 21 U.S.C. section 360bbb-3(b)(1), unless the  authorization is terminated or revoked.  Performed at [facility], [address], [place], [place] [address]   Lactic acid, plasma     Status: Abnormal   Collection Time: 10/19/20  5:44 PM  Result Value Ref [place]   Lactic Acid, Venous 2.1 (HH) 0.5 - 1.9 mmol/L    Comment: CRITICAL VALUE NOTED.  VALUE IS CONSISTENT WITH PREVIOUSLY REPORTED AND CALLED VALUE. Performed at [facility], [address], [place], [place] [address]   Blood culture (routine single)     Status: None (Preliminary result)   Collection Time: 10/19/20  5:44 PM   Specimen: Site Not Specified; Blood  Result Value Ref [place]   Specimen Description SITE NOT SPECIFIED    Special Requests      BOTTLES DRAWN AEROBIC AND ANAEROBIC Blood Culture adequate volume Performed at [facility], [address], [place], [place] [address]    Culture PENDING    Report Status PENDING   Acetaminophen level     Status: Abnormal   Collection Time: 10/19/20  5:44 PM  Result Value Ref [place]   Acetaminophen (Tylenol), Serum <10 (L) 10 - 30 ug/mL    Comment: (NOTE) Therapeutic concentrations vary significantly. A range of 10-30 ug/mL  may be an effective concentration for many patients. However, some  are best treated at concentrations outside of this range. Acetaminophen concentrations >150 ug/mL at 4 hours after ingestion  and >50 ug/mL at 12 hours after ingestion are often associated with  toxic reactions.  Performed at [facility], [address] [place], [place] [address]   Blood gas, arterial     Status: Abnormal   Collection Time: 10/19/20  6:50 PM  Result Value Ref [place]   FIO2 60.00    Delivery systems VENTILATOR    Mode PRESSURE REGULATED VOLUME CONTROL    VT 540 mL   LHR 15.00 resp/min   Peep/cpap 5.0 cm H20   pH, Arterial 7.313 (L) 7.350 - 7.450   pCO2 arterial 45.5  32.0 - 48.0 mmHg   pO2, Arterial 126 (H) 83.0 - 108.0 mmHg   Bicarbonate 21.5 20.0 - 28.0 mmol/L   Acid-base deficit 2.6 (H) 0.0 - 2.0 mmol/L   O2  Saturation 97.8 %   Patient temperature 35.2    Collection site LEFT RADIAL    Drawn by [id number]    Allens test (pass/fail) PASS PASS    Comment: Performed at [facility], [address], [place], [place] [address]  Basic metabolic panel     Status: Abnormal   Collection Time: 10/19/20  9:05 PM  Result Value Ref [place]   Sodium 138 135 - 145 mmol/L   Potassium 3.8 3.5 - 5.1 mmol/L   Chloride 104 98 - 111 mmol/L   CO2 20 (L) 22 - 32 mmol/L   Glucose, Bld 111 (H) 70 - 99 mg/dL    Comment: Glucose reference range applies only to samples taken after fasting for at least 8 hours.   BUN 6 6 - 20 mg/dL   Creatinine, Ser 7.82 (L) 0.61 - 1.24 mg/dL   Calcium 7.6 (L) 8.9 - 10.3 mg/dL   GFR, Estimated >95 >62 mL/min    Comment: (NOTE) Calculated using the CKD-EPI Creatinine Equation (2021)    Anion gap 14 5 - 15    Comment: Performed at [facility], [address], [place], [place] [address]  Magnesium     Status: None   Collection Time: 10/19/20  9:05 PM  Result Value Ref [place]   Magnesium 1.8 1.7 - 2.4 mg/dL    Comment: Performed at [facility], [address], [place], [place] [address]  Phosphorus     Status: None   Collection Time: 10/19/20  9:05 PM  Result Value Ref [place]   Phosphorus 4.4 2.5 - 4.6 mg/dL    Comment: Performed at [facility], [address] [place], [place] [address]    Chemistries  Recent Labs  Lab 10/19/20 1628 10/19/20 2105  NA 135 138  K 3.8 3.8  CL 102 104  CO2 22 20*  GLUCOSE 119* 111*  BUN 5* 6  CREATININE 0.67 0.58*  CALCIUM 8.5* 7.6*  MG  --  1.8  AST 127*  --   ALT 179*  --   ALKPHOS 78  --   BILITOT 0.6  --    ------------------------------------------------------------------------------------------------------------------  ------------------------------------------------------------------------------------------------------------------ GFR: Estimated Creatinine Clearance: 142 mL/min (A) (by C-G formula based on SCr of 0.58 mg/dL  (L)). Liver Function Tests: Recent Labs  Lab 10/19/20 1628  AST 127*  ALT 179*  ALKPHOS 78  BILITOT 0.6  PROT 7.3  ALBUMIN 4.1   No results for input(s): LIPASE, AMYLASE in the last 168 hours. No results for input(s): AMMONIA in the last 168 hours. Coagulation Profile: Recent Labs  Lab 10/19/20 1628  INR 1.0   Cardiac Enzymes: No results for input(s): CKTOTAL, CKMB, CKMBINDEX, TROPONINI in the last 168 hours. BNP (last 3 results) No results for input(s): PROBNP in the last 8760 hours. HbA1C: No results for input(s): HGBA1C in the last 72 hours. CBG: Recent Labs  Lab 10/19/20 1629  GLUCAP 122*   Lipid Profile: No results for input(s): CHOL, HDL, LDLCALC, TRIG, CHOLHDL, LDLDIRECT in the last 72 hours. Thyroid Function Tests: No results for input(s): TSH, T4TOTAL, FREET4, T3FREE, THYROIDAB in the last 72 hours. Anemia Panel: No results for input(s): VITAMINB12, FOLATE, FERRITIN, TIBC, IRON, RETICCTPCT in the last 72 hours.  --------------------------------------------------------------------------------------------------------------- Urine analysis:    Component Value Date/Time   COLORURINE  STRAW (A) 10/19/2020 1628   APPEARANCEUR CLEAR 10/19/2020 1628   APPEARANCEUR Clear 07/01/2014 0844   LABSPEC 1.003 (L) 10/19/2020 1628   LABSPEC 1.015 07/01/2014 0844   PHURINE 5.0 10/19/2020 1628   GLUCOSEU NEGATIVE 10/19/2020 1628   GLUCOSEU Negative 07/01/2014 0844   HGBUR SMALL (A) 10/19/2020 1628   BILIRUBINUR NEGATIVE 10/19/2020 1628   BILIRUBINUR Negative 07/01/2014 0844   KETONESUR NEGATIVE 10/19/2020 1628   PROTEINUR NEGATIVE 10/19/2020 1628   NITRITE NEGATIVE 10/19/2020 1628   LEUKOCYTESUR NEGATIVE 10/19/2020 1628   LEUKOCYTESUR Negative 07/01/2014 0844      Imaging Results:    DG Chest Port 1 View  Result Date: 10/19/2020 CLINICAL DATA:  Drug overdose. Possible sepsis. Endotracheal tube placement. EXAM: PORTABLE CHEST 1 VIEW COMPARISON:  07/06/2020  FINDINGS: Endotracheal tube and nasogastric tube are seen in appropriate position. Low lung volumes are again noted. Bibasilar atelectasis is seen, without evidence of pulmonary consolidation or edema. No evidence of pleural effusion. Stable heart size. IMPRESSION: Endotracheal tube and nasogastric tube in appropriate position. Low lung volumes with bibasilar atelectasis. Electronically Signed   By: Danae Orleans M.D.   On: 10/19/2020 17:44    My personal review of EKG: Rhythm sinus tach, Rate 128 /min, QTc 464 ,no Acute ST changes   Assessment & Plan:    Principal Problem:   Overdose Active Problems:   Intentional overdose of drug in tablet form (HCC)   Essential hypertension   History of alcohol use disorder   Acute respiratory failure (HCC)   Transaminitis   Hypothermia   1. Overdose 1. History seems consistent with intentional 2. When medically cleared will need psych evaluation 3. Intubated for protection of airway 4. Activated charcoal ordered per poison control 5. Serial EKGs to monitor QTC as well as telemetry 6. As needed Ativan for seizure 7. Continue to monitor 2. History of alcohol dependence 1. With an alcohol level 351 2. Change propofol to Precedex 3. As needed Ativan for seizure 4. Continue to monitor 3. Acute respiratory failure 1. Patient was unable to protect airway secondary acute metabolic encephalopathy 2. Continue mechanical ventilation 3. Initial ABG shows adequate oxygenation and ventilation of CO2 4. Monitor ABG in a.m. 5. Repeat chest x-ray in the a.m. continue to monitor placement of ET 6. Continue to monitor 4. Acute metabolic encephalopathy 1. Patient unresponsive on arrival and not protecting airway 2. Secondary to overdose 3. Treatment as above 5. Hypothermia 1. Secondary to overdose 2. Warming blanket applied after warm blankets did not adequately improve temperature 3. Continue to monitor with Foley temp 6. Hypertension 1. Hydralazine as  needed 7. Transaminitis 1. Secondary to overdose with Seroquel/alcohol abuse 2. Trend in the a.m. 3. If no improvement with treatment of overdose, consider quadrant ultrasound   DVT Prophylaxis-Heparin- SCDs   AM Labs Ordered, also please review Full Orders  Family Communication: No family at bedside  Code Status: Full  Admission status: Inpatient :The appropriate admission status for this patient is INPATIENT. Inpatient status is judged to be reasonable and necessary in order to provide the required intensity of service to ensure the patient's safety. The patient's presenting symptoms, physical exam findings, and initial radiographic and laboratory data in the context of their chronic comorbidities is felt to place them at high risk for further clinical deterioration. Furthermore, it is not anticipated that the patient will be medically stable for discharge from the hospital within 2 midnights of admission. The following factors support the admission status of inpatient.  The patient's presenting symptoms include overdose The worrisome physical exam findings include agonal breathing/respiratory failure  the initial radiographic and laboratory data are worrisome because of transaminitis, elevated alcohol level The chronic co-morbidities include psychiatric disorder, history of alcohol dependence     * I certify that at the point of admission it is my clinical judgment that the patient will require inpatient hospital care spanning beyond 2 midnights from the point of admission due to high intensity of service, high risk for further deterioration and high frequency of surveillance required.*  Time spent in minutes : 68  Jarreau Callanan B Zierle-Ghosh DO

## 2020-10-19 NOTE — ED Notes (Addendum)
Per EMS, pt took synthetic opioids called Kratom.

## 2020-10-19 NOTE — ED Notes (Signed)
Poison Control called by Gillian Shields PA.

## 2020-10-19 NOTE — ED Provider Notes (Signed)
Discussed with poison control.  Recommends: - activated charcoal without sorbitol, 1 g/kg -Monitor QRS for widening, if greater than 120 administer IV bolus of sodium bicarb -Monitor QTC is greater than 500 optimize with potassium and magnesium to upper limit of normal -Recheck EKG in 6hrs -4-hour Tylenol level -Seizure potential with kratom overdose.  If seizure recurs recommends benzodiazepine, potentially needing higher dose than usual.  If additional medications needed, phenobarbital.  If additional medications needed, propofol. -Monitor for neuroleptic malignant syndrome, if dystonia treat with Benadryl or benztropine     Irisa Grimsley, [place] N, PA-C 10/19/20 1709    Bethann Berkshire, MD 10/19/20 2255

## 2020-10-19 NOTE — ED Provider Notes (Signed)
[facility] EMERGENCY DEPARTMENT Provider Note   CSN: [id number] Arrival date & time: 10/19/20  1623     History Chief Complaint  Patient presents with  . Drug Overdose    Ryan Faulkner is a 40 y.o. male.  Patient was awake when the paramedics arrived and the paramedics stated that the patient took 2200 mg of Seroquel along with drinking much alcohol and took some kratom.  After the patient got into the ambulance he then became obtunded and had snoring respirations.  The history is provided by the police and the EMS personnel.  Drug Overdose This is a new problem. The current episode started less than 1 hour ago. The problem occurs rarely. The problem has not changed since onset.Pertinent negatives include no chest pain. Nothing aggravates the symptoms. Nothing relieves the symptoms. Treatments tried: Patient given Narcan without help.       Past Medical History:  Diagnosis Date  . Alcohol dependence (HCC)   . 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 Hypertension   . Suicidal ideation     Patient Active Problem List   Diagnosis Date Noted  . Overdose by acetaminophen 10/19/2020  . Severe bipolar I disorder, most recent episode depressed (HCC) 07/14/2020  . Intentional overdose of drug in tablet form (HCC) 07/06/2020  . Essential hypertension 07/06/2020  . Tobacco dependence 07/06/2020  . Class 1 obesity due to excess calories with body mass index (BMI) of 30.0 to 30.9 in adult 07/06/2020  . 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 Alcohol use disorder, severe, dependence (HCC) 02/15/2020  . Delirium tremens (HCC) 12/16/2018  . Alcohol abuse 06/27/2018    Past Surgical History:  Procedure Laterality Date  . TONSILLECTOMY         No family history on file.  Social History   Tobacco Use  . Smoking status: Current Every Day Smoker  . Smokeless tobacco: Never Used  Substance Use Topics  .  Alcohol use: Yes    Alcohol/week: 24.0 standard drinks    Types: 24 Cans of beer per week  . Drug use: No    Home Medications Prior to Admission medications   Medication Sig Start Date End Date Taking? Authorizing Provider  acetaminophen (TYLENOL) 325 MG tablet Take 2 tablets (650 mg total) by mouth every 6 (six) hours as needed for mild pain or moderate pain. 07/12/20   Arrien, York Ram, MD  amLODipine (NORVASC) 10 MG tablet Take 1 tablet (10 mg total) by mouth daily. 07/15/20   Jesse Sans, MD  cariprazine [facility]) capsule Take 1 capsule (1.5 mg total) by mouth daily. 07/15/20   Jesse Sans, MD  divalproex (DEPAKOTE) 250 MG DR tablet Take 1 tablet (250 mg total) by mouth every 12 (twelve) hours. 07/15/20   Jesse Sans, MD  EPINEPHrine 0.3 mg/0.3 mL IJ SOAJ injection Inject 0.3 mg into the muscle as needed for anaphylaxis.    [provider]  lisinopril (ZESTRIL) 10 MG tablet Take 1 tablet (10 mg total) by mouth daily. 07/15/20   Jesse Sans, MD  naproxen (NAPROSYN) 500 MG tablet Take 1 tablet (500 mg total) by mouth 2 (two) times daily with a meal. 12/09/19   Joni Reining, PA-C    Allergies    Bee venom  Review of Systems   Review of Systems  Unable to perform ROS: Mental status change  Cardiovascular: Negative for chest pain.  Physical Exam Updated Vital Signs BP 97/69   Pulse (!) 118   Temp (!) 94.8 F (34.9 C)   Resp 13   Ht 5\' 8"  (1.727 m)   Wt 99.8 kg   SpO2 98%   BMI 33.45 kg/m   Physical Exam Vitals and nursing note reviewed.  Constitutional:      Appearance: He is well-developed and normal weight.  HENT:     Head: Normocephalic.     Nose: Nose normal.  Eyes:     General: No scleral icterus.    Conjunctiva/sclera: Conjunctivae normal.  Neck:     Thyroid: No thyromegaly.  Cardiovascular:     Rate and Rhythm: Regular rhythm. Tachycardia present.     Heart sounds: No murmur heard. No friction rub. No gallop.    Pulmonary:     Breath sounds: No stridor. No wheezing or rales.  Chest:     Chest wall: No tenderness.  Abdominal:     General: There is no distension.     Tenderness: There is no abdominal tenderness. There is no rebound.  Musculoskeletal:        General: Normal range of motion.     Cervical back: Neck supple.  Lymphadenopathy:     Cervical: No cervical adenopathy.  Skin:    Findings: No erythema or rash.  Neurological:     Motor: No abnormal muscle tone.     Coordination: Coordination normal.     Comments: Patient with snoring respirations and not responding to verbal or painful stimuli     ED Results / Procedures / Treatments   Labs (all labs ordered are listed, but only abnormal results are displayed) Labs Reviewed  LACTIC ACID, PLASMA - Abnormal; Notable for the following components:      Result Value   Lactic Acid, Venous 2.0 (*)    All other components within normal limits  COMPREHENSIVE METABOLIC PANEL - Abnormal; Notable for the following components:   Glucose, Bld 119 (*)    BUN 5 (*)    Calcium 8.5 (*)    AST 127 (*)    ALT 179 (*)    All other components within normal limits  URINALYSIS, ROUTINE W REFLEX MICROSCOPIC - Abnormal; Notable for the following components:   Color, Urine STRAW (*)    Specific Gravity, Urine 1.003 (*)    Hgb urine dipstick SMALL (*)    All other components within normal limits  ETHANOL - Abnormal; Notable for the following components:   Alcohol, Ethyl (B) 351 (*)    All other components within normal limits  VALPROIC ACID LEVEL - Abnormal; Notable for the following components:   Valproic Acid Lvl <10 (*)    All other components within normal limits  CBG MONITORING, ED - Abnormal; Notable for the following components:   Glucose-Capillary 122 (*)    All other components within normal limits  CULTURE, BLOOD (SINGLE)  URINE CULTURE  RESP PANEL BY RT-PCR (FLU A&B, COVID) ARPGX2  CBC WITH DIFFERENTIAL/PLATELET  PROTIME-INR  APTT   RAPID URINE DRUG SCREEN, HOSP PERFORMED  LACTIC ACID, PLASMA  ACETAMINOPHEN LEVEL  BLOOD GAS, ARTERIAL    EKG EKG Interpretation  Date/Time:  Tuesday October 19 2020 16:29:01 EDT Ventricular Rate:  128 PR Interval:  147 QRS Duration: 89 QT Interval:  318 QTC Calculation: 464 R Axis:   82 Text Interpretation: Sinus tachycardia Ventricular premature complex Aberrant conduction of SV complex(es) Probable left atrial enlargement Confirmed by [id number] [phone]) on 10/19/2020 5:44:56 PM  Radiology DG Chest Port 1 View  Result Date: 10/19/2020 CLINICAL DATA:  Drug overdose. Possible sepsis. Endotracheal tube placement. EXAM: PORTABLE CHEST 1 VIEW COMPARISON:  07/06/2020 FINDINGS: Endotracheal tube and nasogastric tube are seen in appropriate position. Low lung volumes are again noted. Bibasilar atelectasis is seen, without evidence of pulmonary consolidation or edema. No evidence of pleural effusion. Stable heart size. IMPRESSION: Endotracheal tube and nasogastric tube in appropriate position. Low lung volumes with bibasilar atelectasis. Electronically Signed   By: Danae Orleans M.D.   On: 10/19/2020 17:44    Procedures Procedure Name: Intubation Date/Time: 10/19/2020 6:21 PM Performed by: Bethann Berkshire, MD Pre-anesthesia Checklist: Patient identified, Patient being monitored, Emergency Drugs available, Timeout performed and Suction available Oxygen Delivery Method: Non-rebreather mask Preoxygenation: Pre-oxygenation with 100% oxygen Induction Type: Rapid sequence Ventilation: Mask ventilation without difficulty Placement Confirmation: ETT inserted through vocal cords under direct vision,  CO2 detector and Breath sounds checked- equal and bilateral Comments: Patient was given etomidate and succinylcholine.  Glide scope was used to intubate the patient.  A 7 and half centimeter tube was used.  Intubation went well and only took 1 try.  Tube placement was confirmed by CO2 and  auscultation along with chest x-ray.        Medications Ordered in ED Medications  charcoal activated (NO SORBITOL) (ACTIDOSE-AQUA) suspension 100 g (has no administration in time range)  sodium chloride 0.9 % bolus 1,000 mL (0 mLs Intravenous Stopped 10/19/20 1723)  naloxone (NARCAN) injection 2 mg (2 mg Intravenous Given 10/19/20 1626)  naloxone (NARCAN) injection 4 mg (4 mg Intravenous Given 10/19/20 1643)  etomidate (AMIDATE) injection 20 mg (20 mg Intravenous Given 10/19/20 1702)  succinylcholine (ANECTINE) injection 120 mg (120 mg Intravenous Given 10/19/20 1702)  sodium chloride 0.9 % bolus 1,000 mL (1,000 mLs Intravenous New Bag/Given 10/19/20 1701)    ED Course  I have reviewed the triage vital signs and the nursing notes.  Pertinent labs & imaging results that were available during my care of the patient were reviewed by me and considered in my medical decision making (see chart for details). CRITICAL CARE Performed by: Bethann Berkshire Total critical care time: 55 minutes Critical care time was exclusive of separately billable procedures and treating other patients. Critical care was necessary to treat or prevent imminent or life-threatening deterioration. Critical care was time spent personally by me on the following activities: development of treatment plan with patient and/or surrogate as well as nursing, discussions with consultants, evaluation of patient's response to treatment, examination of patient, obtaining history from patient or surrogate, ordering and performing treatments and interventions, ordering and review of laboratory studies, ordering and review of radiographic studies, pulse oximetry and re-evaluation of patient's condition. Patient had to be intubated because he completely lost his gag reflex.  The intubation went well.  I spoke with critical care and they stated they felt like the patient could stay at any [facility] hospital.  They did not recommend antibiotics at  this time just supportive care.  We spoke with poison control and they have recommendations that [place] put in a note.   MDM Rules/Calculators/A&P                          Intentional overdose with Seroquel kratom and alcohol.  Patient is intubated and will get supportive care by the hospitalist with admission to ICU Final Clinical Impression(s) / ED Diagnoses Final diagnoses:  Intentional drug overdose, initial encounter (HCC)  Rx / DC Orders ED Discharge Orders    None       Bethann BerkshireZammit, Mosi Hannold, MD 10/19/20 Rickey [id number]

## 2020-10-19 NOTE — ED Triage Notes (Signed)
Pt presents to ED via [facility] for overdose. Girlfriend called EMS for overdose, pt alert and oriented initially became unresponsive with snoring respirations, pt denied taking medication except for Seroquel and drinking 6 beers. O2 sats decreased to 78%, Pt received Narcan 4 mg IN, 15L non rebreather started by EMS sats increased to 93%, pinpoint pupils. 18 g R Forearm by EMS

## 2020-10-19 NOTE — Progress Notes (Addendum)
eLink Physician-Brief Progress Note Patient Name: Ryan Faulkner DOB: 09-22-1980 MRN: [id number]   Date of Service  10/19/2020  HPI/Events of Note  Patient just arrived to the ICU. Seen on camera. Current vitals stable on PRVC V 550, peep 5, RR 15, o2 60%. ER note reviewed. Admit note pending and RN mentioned that MD is at bedside and doing the admission and placing orders. Seroquel, Krotam and alcohol overdose. Currently agitated on the vent and is being sedated with propofol. Most recent EKG with Qtc 466, QRS is 97. Do not see a mag level. ABG reviewed on current vent support.   eICU Interventions  Suggest-  Continue sedation while on vent for comfort Watch for seizures Check bmp and mag, would replete to keep K around 4 and mag over 2 Serial EKG, ABG in AM  Poison control already contacted Hydration  If habitual alcoholic, would also add thiamine / folate to regimen and watch for withdrawl DVT and GI prophylaxis while on vent  Admission is being done by bedside. Asked that RN reach out to E link for any concerns.      Intervention Category Major Interventions: Respiratory failure - evaluation and management;Other: Minor Interventions: Agitation / anxiety - evaluation and management Evaluation Type: New Patient Evaluation  Ryan Faulkner 10/19/2020, 8:38 PM   10:10 pm  Called for hypotension with SBP in 80s, seen on camera Admit note is pending in system but RN tells me patient was switched to precedex and is at 1 mic already and has dropped his BP since then. I am told BP has been in 80s now for 20 min. In addition, two RNs holding him down in his room. Apparently was doing much better on propofol Has Ativan ordered but mentioned only as PRN seizures Ativan x 1 now, LR bolus 1 liter, and have re ordered the propofol since it was tolerated better and was more effective for sedation as well, per RN. At almost max dose precedex, he is struggling and fighting everyone and would be  risk for self extubation so need a better sedation strategy I asked RN to let me know if hemodynamic issues repeat while on propofol as well In addition, 20 meq IV kcl and 2 gram IV mag ordered  12:01 am  Repeat hypotension Now off precedex Very comfortable on low dose propofol only however drops his BP BP is fine when not on sedation but obviously then agitated and in distress Has a good 18 G IV and additional 2 peripheral IV lines that RN has secured Start peripheral levophed - sedation plus ingested substances likely causing labile Bps, in addition I also wrote for another LR bolus D/w RN in detail.

## 2020-10-19 NOTE — ED Notes (Signed)
Unable to sign MSE due to unresponsive.

## 2020-10-19 NOTE — ED Notes (Signed)
I assumed care of patient at this time. 

## 2020-10-19 NOTE — ED Notes (Signed)
Radiology at bedside

## 2020-10-19 NOTE — ED Notes (Signed)
Pt intubated with 7.5 Et tube by Dr Estell Harpin, 1 attempt without difficulty. 23 cm at lip, + color change on CO2 detector, bilateral breath sounds noted.

## 2020-10-20 ENCOUNTER — Inpatient Hospital Stay (HOSPITAL_COMMUNITY): Payer: BC Managed Care – PPO

## 2020-10-20 DIAGNOSIS — T50902A Poisoning by unspecified drugs, medicaments and biological substances, intentional self-harm, initial encounter: Secondary | ICD-10-CM

## 2020-10-20 DIAGNOSIS — F319 Bipolar disorder, unspecified: Secondary | ICD-10-CM

## 2020-10-20 DIAGNOSIS — J9600 Acute respiratory failure, unspecified whether with hypoxia or hypercapnia: Secondary | ICD-10-CM

## 2020-10-20 DIAGNOSIS — Z978 Presence of other specified devices: Secondary | ICD-10-CM

## 2020-10-20 DIAGNOSIS — I10 Essential (primary) hypertension: Secondary | ICD-10-CM

## 2020-10-20 LAB — BLOOD GAS, ARTERIAL
Acid-base deficit: 2.6 mmol/L — ABNORMAL HIGH (ref 0.0–2.0)
Bicarbonate: 22 mmol/L (ref 20.0–28.0)
Drawn by: 22223
FIO2: 40
MECHVT: 550 mL
O2 Saturation: 95.5 %
PEEP: 5 cmH2O
Patient temperature: 36.8
RATE: 16 resp/min
pCO2 arterial: 42.2 mmHg (ref 32.0–48.0)
pH, Arterial: 7.342 — ABNORMAL LOW (ref 7.350–7.450)
pO2, Arterial: 85.8 mmHg (ref 83.0–108.0)

## 2020-10-20 LAB — CBC WITH DIFFERENTIAL/PLATELET
Abs Immature Granulocytes: 0.03 10*3/uL (ref 0.00–0.07)
Basophils Absolute: 0 10*3/uL (ref 0.0–0.1)
Basophils Relative: 1 %
Eosinophils Absolute: 0.1 10*3/uL (ref 0.0–0.5)
Eosinophils Relative: 2 %
HCT: 42.8 % (ref 39.0–52.0)
Hemoglobin: 13.8 g/dL (ref 13.0–17.0)
Immature Granulocytes: 0 %
Lymphocytes Relative: 20 %
Lymphs Abs: 1.5 10*3/uL (ref 0.7–4.0)
MCH: 31.5 pg (ref 26.0–34.0)
MCHC: 32.2 g/dL (ref 30.0–36.0)
MCV: 97.7 fL (ref 80.0–100.0)
Monocytes Absolute: 0.6 10*3/uL (ref 0.1–1.0)
Monocytes Relative: 8 %
Neutro Abs: 5.2 10*3/uL (ref 1.7–7.7)
Neutrophils Relative %: 69 %
Platelets: 166 10*3/uL (ref 150–400)
RBC: 4.38 MIL/uL (ref 4.22–5.81)
RDW: 12.6 % (ref 11.5–15.5)
WBC: 7.4 10*3/uL (ref 4.0–10.5)
nRBC: 0 % (ref 0.0–0.2)

## 2020-10-20 LAB — TRIGLYCERIDES: Triglycerides: 178 mg/dL — ABNORMAL HIGH (ref <150)

## 2020-10-20 LAB — MAGNESIUM: Magnesium: 1.9 mg/dL (ref 1.7–2.4)

## 2020-10-20 LAB — COMPREHENSIVE METABOLIC PANEL
ALT: 121 U/L — ABNORMAL HIGH (ref 0–44)
AST: 68 U/L — ABNORMAL HIGH (ref 15–41)
Albumin: 3 g/dL — ABNORMAL LOW (ref 3.5–5.0)
Alkaline Phosphatase: 56 U/L (ref 38–126)
Anion gap: 10 (ref 5–15)
BUN: 5 mg/dL — ABNORMAL LOW (ref 6–20)
CO2: 22 mmol/L (ref 22–32)
Calcium: 8.1 mg/dL — ABNORMAL LOW (ref 8.9–10.3)
Chloride: 107 mmol/L (ref 98–111)
Creatinine, Ser: 0.64 mg/dL (ref 0.61–1.24)
GFR, Estimated: >60 mL/min (ref >60)
Glucose, Bld: 86 mg/dL (ref 70–99)
Potassium: 3.9 mmol/L (ref 3.5–5.1)
Sodium: 139 mmol/L (ref 135–145)
Total Bilirubin: 0.5 mg/dL (ref 0.3–1.2)
Total Protein: 5.5 g/dL — ABNORMAL LOW (ref 6.5–8.1)

## 2020-10-20 LAB — MRSA PCR SCREENING: MRSA by PCR: POSITIVE — AB

## 2020-10-20 LAB — GLUCOSE, CAPILLARY: Glucose-Capillary: 94 mg/dL (ref 70–99)

## 2020-10-20 MED ORDER — DOCUSATE SODIUM 50 MG/5ML PO LIQD
100.0000 mg | Freq: Two times a day (BID) | ORAL | Status: DC
  Filled 2020-10-20 (×3): qty 10

## 2020-10-20 MED ORDER — ENOXAPARIN SODIUM 60 MG/0.6ML ~~LOC~~ SOLN
50.0000 mg | SUBCUTANEOUS | Status: DC
  Filled 2020-10-20: qty 0.6

## 2020-10-20 MED ORDER — AMLODIPINE BESYLATE 5 MG PO TABS
10.0000 mg | ORAL_TABLET | Freq: Every day | ORAL | Status: DC
  Administered 2020-10-20 – 2020-10-22 (×3): 10 mg via ORAL
  Filled 2020-10-20 (×3): qty 2

## 2020-10-20 MED ORDER — NICOTINE 21 MG/24HR TD PT24
21.0000 mg | MEDICATED_PATCH | Freq: Every day | TRANSDERMAL | Status: DC
  Administered 2020-10-20 – 2020-10-22 (×3): 21 mg via TRANSDERMAL
  Filled 2020-10-20 (×3): qty 1

## 2020-10-20 MED ORDER — FENTANYL CITRATE (PF) 100 MCG/2ML IJ SOLN
50.0000 ug | Freq: Once | INTRAMUSCULAR | Status: AC
Start: 2020-10-20 — End: 2020-10-20
  Administered 2020-10-20: 50 ug via INTRAVENOUS

## 2020-10-20 MED ORDER — SODIUM CHLORIDE 0.9 % IV SOLN
1.0000 mg | Freq: Once | INTRAVENOUS | Status: AC
  Administered 2020-10-20: 1 mg via INTRAVENOUS
  Filled 2020-10-20: qty 0.2

## 2020-10-20 MED ORDER — LORAZEPAM 2 MG/ML IJ SOLN
0.0000 mg | Freq: Three times a day (TID) | INTRAMUSCULAR | Status: DC
  Filled 2020-10-20: qty 1

## 2020-10-20 MED ORDER — ORAL CARE MOUTH RINSE
15.0000 mL | OROMUCOSAL | Status: DC
  Administered 2020-10-20: 15 mL via OROMUCOSAL

## 2020-10-20 MED ORDER — ADULT MULTIVITAMIN W/MINERALS CH
1.0000 | ORAL_TABLET | Freq: Every day | ORAL | Status: DC
  Administered 2020-10-20 – 2020-10-22 (×3): 1 via ORAL
  Filled 2020-10-20 (×3): qty 1

## 2020-10-20 MED ORDER — CALCIUM CARBONATE ANTACID 500 MG PO CHEW
1.0000 | CHEWABLE_TABLET | ORAL | Status: DC | PRN
  Administered 2020-10-20 – 2020-10-21 (×2): 200 mg via ORAL
  Filled 2020-10-20 (×2): qty 1

## 2020-10-20 MED ORDER — FENTANYL BOLUS VIA INFUSION
50.0000 ug | INTRAVENOUS | Status: DC | PRN
  Filled 2020-10-20: qty 100

## 2020-10-20 MED ORDER — THIAMINE HCL 100 MG/ML IJ SOLN
100.0000 mg | Freq: Every day | INTRAMUSCULAR | Status: DC
  Administered 2020-10-20 – 2020-10-22 (×3): 100 mg via INTRAVENOUS
  Filled 2020-10-20 (×3): qty 2

## 2020-10-20 MED ORDER — LISINOPRIL 10 MG PO TABS
10.0000 mg | ORAL_TABLET | Freq: Every day | ORAL | Status: DC
  Administered 2020-10-20 – 2020-10-22 (×3): 10 mg via ORAL
  Filled 2020-10-20 (×3): qty 1

## 2020-10-20 MED ORDER — LORAZEPAM 2 MG/ML IJ SOLN
0.0000 mg | INTRAMUSCULAR | Status: AC
  Administered 2020-10-20: 2 mg via INTRAVENOUS
  Administered 2020-10-20: 1 mg via INTRAVENOUS
  Administered 2020-10-20 – 2020-10-21 (×2): 2 mg via INTRAVENOUS
  Administered 2020-10-22: 1 mg via INTRAVENOUS
  Administered 2020-10-22: 2 mg via INTRAVENOUS
  Filled 2020-10-20 (×6): qty 1

## 2020-10-20 MED ORDER — LORAZEPAM 2 MG/ML IJ SOLN
1.0000 mg | INTRAMUSCULAR | Status: DC | PRN
  Administered 2020-10-21 – 2020-10-22 (×3): 2 mg via INTRAVENOUS
  Filled 2020-10-20 (×2): qty 1

## 2020-10-20 MED ORDER — LISINOPRIL 10 MG PO TABS: 20.0000 mg | ORAL_TABLET | Freq: Every day | ORAL | Status: DC

## 2020-10-20 MED ORDER — POLYETHYLENE GLYCOL 3350 17 G PO PACK: 17.0000 g | PACK | Freq: Every day | ORAL | Status: DC

## 2020-10-20 MED ORDER — LORAZEPAM 1 MG PO TABS
1.0000 mg | ORAL_TABLET | ORAL | Status: DC | PRN
  Administered 2020-10-21: 2 mg via ORAL
  Filled 2020-10-20: qty 2
  Filled 2020-10-20: qty 1

## 2020-10-20 MED ORDER — FENTANYL 2500MCG IN NS 250ML (10MCG/ML) PREMIX INFUSION
50.0000 ug/h | INTRAVENOUS | Status: DC
Start: 2020-10-20 — End: 2020-10-20
  Administered 2020-10-20: 50 ug/h via INTRAVENOUS
  Filled 2020-10-20: qty 250

## 2020-10-20 MED ORDER — MUPIROCIN 2 % EX OINT
TOPICAL_OINTMENT | Freq: Two times a day (BID) | CUTANEOUS | Status: DC
  Filled 2020-10-20 (×2): qty 22

## 2020-10-20 MED ORDER — CHLORHEXIDINE GLUCONATE 0.12% ORAL RINSE (MEDLINE KIT)
15.0000 mL | Freq: Two times a day (BID) | OROMUCOSAL | Status: DC
  Administered 2020-10-20: 15 mL via OROMUCOSAL

## 2020-10-20 MED ORDER — LABETALOL HCL 5 MG/ML IV SOLN
10.0000 mg | INTRAVENOUS | Status: DC | PRN
  Administered 2020-10-20: 10 mg via INTRAVENOUS
  Filled 2020-10-20: qty 4

## 2020-10-20 MED ORDER — CHLORHEXIDINE GLUCONATE CLOTH 2 % EX PADS
6.0000 | MEDICATED_PAD | Freq: Every day | CUTANEOUS | Status: DC
  Administered 2020-10-20 – 2020-10-22 (×3): 6 via TOPICAL

## 2020-10-20 NOTE — Progress Notes (Signed)
Patient's girlfriend visiting patient. Wanded by security prior to entry into patient's room. Girlfriend took patient's ID card and debit cards from patient's belongings. Patient aware and agreeable.

## 2020-10-20 NOTE — Progress Notes (Signed)
AT 1020 hr RT performed weaning parameters on patient, patient performed -40 NIF, FVC 1.4L. Patient also alert and following commands. MD notified and RT received orders to extubate. RT extubated patient at 1028 hr to 2L O2 via nasal cannula, HR 98, RR 25 and SPO2 94%. BBS diminished and clear. RN at bedside, no complications noted. RT will continue to monitor.

## 2020-10-20 NOTE — Progress Notes (Signed)
PROGRESS NOTE    Ryan Faulkner  NOM:[id number] DOB: [birth year] DOA: 10/19/2020 PCP: Pcp, No    Brief Narrative:  40 y/o male with history of Bipolar, alcohol abuse, admitted after intentional overdose. He was unresponsive on arrival and intubated in ED since it was felt that he was unable to protect airway. Had transient hypotension after admission which was likely related to meds. PCCM consulted for vent management. Will need psychiatry input once medically stable.    Assessment & Plan:   Principal Problem:   Overdose Active Problems:   Intentional overdose of drug in tablet form (HCC)   Essential hypertension   History of alcohol use disorder   Acute respiratory failure (HCC)   Transaminitis   Hypothermia   Bipolar disorder (HCC)   Intentional overdose -patient had taken 2 grams of seroquel, kratom and alcohol -received activated charcoal per poison control -continue to monitor for arrhythmias -will need TTS evaluation once extubated and stable  Acute toxic encephalopathy -related to overdose of medications/alcohol -will need to reassess mental status once he is extubated  Acute respiratory failure -patient was intubated in ED due to concerns that he was not able to protect his airway -ABG is acceptable -chest xray did not show any evidence of pneumonia -PCCM consult to assist with vent management  History of alcohol dependence -per review of records, he does have a history of DTs -currently on propofol for sedation -continue to monitor and will start on CIWA once extubated -continue on thiamine and folate  Hypertension -chronically on lisinopril and amlodipine -had hypotension overnight, possibly related to meds -BP currently stable -continue to hold antihypertensives for now  Bipolar disorder -chronically on seroquel, depakote, cariprazine -holding all meds since he was altered on admission -will defer to psych regarding resuming meds  Elevated  LFTs -suspect this is related to alcohol -continue to follow   DVT prophylaxis: heparin injection 5,000 Units Start: 10/19/20 2200 SCDs Start: 10/19/20 2059  Code Status: full code Family Communication: no family at bedside Disposition Plan: Status is: Inpatient  Remains inpatient appropriate because:Inpatient level of care appropriate due to severity of illness   Dispo: The patient is from: Home              Anticipated d/c is to: TBD, possible inpatient psych              Patient currently is not medically stable to d/c.   Difficult to place patient No         Consultants:   PCCM  Procedures:   ETT 4/26>  Antimicrobials:       Subjective: Intubated and sedated. Was agitated overnight and is currently in wrist restraints. He had some hypotension overnight and briefly required levophed, but has since been weaned off.   Objective: Vitals:   10/20/20 0300 10/20/20 0400 10/20/20 0411 10/20/20 0500  BP: 106/64 115/71  131/86  Pulse: 90 (!) 115  95  Resp: 16 (!) 21  18  Temp: (!) 97.16 F (36.2 C) 98.06 F (36.7 C) 98.2 F (36.8 C) (P) 99.14 F (37.3 C)  TempSrc:      SpO2: 99% 98%  98%  Weight:   103.2 kg   Height:        Intake/Output Summary (Last 24 hours) at 10/20/2020 0717 Last data filed at 10/20/2020 0526 Gross per 24 hour  Intake 4108.26 ml  Output 1775 ml  Net 2333.26 ml   Filed Weights   10/19/20 1630 10/20/20 0411  Weight:  99.8 kg 103.2 kg    Examination:  General exam: intubated and sedated Respiratory system: Clear to auscultation. Respiratory effort normal. Cardiovascular system: S1 & S2 heard, RRR. No JVD, murmurs, rubs, gallops or clicks. No pedal edema. Gastrointestinal system: Abdomen is nondistended, soft and nontender. No organomegaly or masses felt. Normal bowel sounds heard. Central nervous system: unable to assess due to sedation Extremities: he is in bilateral wrist restraints Skin: No rashes, lesions or  ulcers Psychiatry: unable to assess.     Data Reviewed: I have personally reviewed following labs and imaging studies  CBC: Recent Labs  Lab 10/19/20 1628 10/20/20 0531  WBC 8.3 7.4  NEUTROABS 4.1 5.2  HGB 16.8 13.8  HCT 50.3 42.8  MCV 96.5 97.7  PLT 200 166   Basic Metabolic Panel: Recent Labs  Lab 10/19/20 1628 10/19/20 2105  NA 135 138  K 3.8 3.8  CL 102 104  CO2 22 20*  GLUCOSE 119* 111*  BUN 5* 6  CREATININE 0.67 0.58*  CALCIUM 8.5* 7.6*  MG  --  1.8  PHOS  --  4.4   GFR: Estimated Creatinine Clearance: 144.3 mL/min (A) (by C-G formula based on SCr of 0.58 mg/dL (L)). Liver Function Tests: Recent Labs  Lab 10/19/20 1628  AST 127*  ALT 179*  ALKPHOS 78  BILITOT 0.6  PROT 7.3  ALBUMIN 4.1   No results for input(s): LIPASE, AMYLASE in the last 168 hours. No results for input(s): AMMONIA in the last 168 hours. Coagulation Profile: Recent Labs  Lab 10/19/20 1628  INR 1.0   Cardiac Enzymes: No results for input(s): CKTOTAL, CKMB, CKMBINDEX, TROPONINI in the last 168 hours. BNP (last 3 results) No results for input(s): PROBNP in the last 8760 hours. HbA1C: No results for input(s): HGBA1C in the last 72 hours. CBG: Recent Labs  Lab 10/19/20 1629  GLUCAP 122*   Lipid Profile: No results for input(s): CHOL, HDL, LDLCALC, TRIG, CHOLHDL, LDLDIRECT in the last 72 hours. Thyroid Function Tests: No results for input(s): TSH, T4TOTAL, FREET4, T3FREE, THYROIDAB in the last 72 hours. Anemia Panel: No results for input(s): VITAMINB12, FOLATE, FERRITIN, TIBC, IRON, RETICCTPCT in the last 72 hours. Sepsis Labs: Recent Labs  Lab 10/19/20 1636 10/19/20 1744  LATICACIDVEN 2.0* 2.1*    Recent Results (from the past 240 hour(s))  Resp Panel by RT-PCR (Flu A&B, Covid) Nasopharyngeal Swab     Status: None   Collection Time: 10/19/20  4:36 PM   Specimen: Nasopharyngeal Swab; Nasopharyngeal(NP) swabs in vial transport medium  Result Value Ref Range Status    SARS Coronavirus 2 by RT PCR NEGATIVE NEGATIVE Final    Comment: (NOTE) SARS-CoV-2 target nucleic acids are NOT DETECTED.  The SARS-CoV-2 RNA is generally detectable in upper respiratory specimens during the acute phase of infection. The lowest concentration of SARS-CoV-2 viral copies this assay can detect is 138 copies/mL. A negative result does not preclude SARS-Cov-2 infection and should not be used as the sole basis for treatment or other patient management decisions. A negative result may occur with  improper specimen collection/handling, submission of specimen other than nasopharyngeal swab, presence of viral mutation(s) within the areas targeted by this assay, and inadequate number of viral copies(<138 copies/mL). A negative result must be combined with clinical observations, patient history, and epidemiological information. The expected result is Negative.  Fact Sheet for Patients:  BloggerCourse.com  Fact Sheet for Healthcare Providers:  SeriousBroker.it  This test is no t yet approved or cleared by the [place] FDA and  has been authorized for detection and/or diagnosis of SARS-CoV-2 by FDA under an Emergency Use Authorization (EUA). This EUA will remain  in effect (meaning this test can be used) for the duration of the COVID-19 declaration under Section 564(b)(1) of the Act, 21 U.S.C.section 360bbb-3(b)(1), unless the authorization is terminated  or revoked sooner.       Influenza A by PCR NEGATIVE NEGATIVE Final   Influenza B by PCR NEGATIVE NEGATIVE Final    Comment: (NOTE) The Xpert Xpress SARS-CoV-2/FLU/RSV plus assay is intended as an aid in the diagnosis of influenza from Nasopharyngeal swab specimens and should not be used as a sole basis for treatment. Nasal washings and aspirates are unacceptable for Xpert Xpress SARS-CoV-2/FLU/RSV testing.  Fact Sheet for  Patients: BloggerCourse.comhttps://www.fda.gov/media/152166/download  Fact Sheet for Healthcare Providers: SeriousBroker.ithttps://www.fda.gov/media/152162/download  This test is not yet approved or cleared by the [place] FDA and has been authorized for detection and/or diagnosis of SARS-CoV-2 by FDA under an Emergency Use Authorization (EUA). This EUA will remain in effect (meaning this test can be used) for the duration of the COVID-19 declaration under Section 564(b)(1) of the Act, 21 U.S.C. section 360bbb-3(b)(1), unless the authorization is terminated or revoked.  Performed at [facility], [address] [place], [place] [address]   Blood culture (routine single)     Status: None (Preliminary result)   Collection Time: 10/19/20  5:44 PM   Specimen: Site Not Specified; Blood  Result Value Ref Range Status   Specimen Description SITE NOT SPECIFIED  Final   Special Requests   Final    BOTTLES DRAWN AEROBIC AND ANAEROBIC Blood Culture adequate volume Performed at [facility], [address] [place], [place] [address]    Culture PENDING  Incomplete   Report Status PENDING  Incomplete         Radiology Studies: DG CHEST PORT 1 VIEW  Result Date: 10/20/2020 CLINICAL DATA:  Respiratory failure EXAM: PORTABLE CHEST 1 VIEW COMPARISON:  10/19/2020. FINDINGS: Endotracheal tube 5.2 cm above the carina. Nasogastric tube extends into the upper abdomen. Pulmonary insufflation is improved. The lungs are clear. No pneumothorax or pleural effusion. Cardiac size within normal limits. Pulmonary vascularity is normal. IMPRESSION: Stable support tubes. Improved pulmonary insufflation. No focal pulmonary infiltrate. Are Electronically Signed   By: Helyn NumbersAshesh  Parikh MD   On: 10/20/2020 04:34   DG Chest Port 1 View  Result Date: 10/19/2020 CLINICAL DATA:  Drug overdose. Possible sepsis. Endotracheal tube placement. EXAM: PORTABLE CHEST 1 VIEW COMPARISON:  07/06/2020 FINDINGS: Endotracheal tube and nasogastric tube are seen  in appropriate position. Low lung volumes are again noted. Bibasilar atelectasis is seen, without evidence of pulmonary consolidation or edema. No evidence of pleural effusion. Stable heart size. IMPRESSION: Endotracheal tube and nasogastric tube in appropriate position. Low lung volumes with bibasilar atelectasis. Electronically Signed   By: Danae OrleansJohn A Stahl M.D.   On: 10/19/2020 17:44        Scheduled Meds: . docusate  100 mg Per Tube BID  . heparin  5,000 Units Subcutaneous Q8H  . polyethylene glycol  17 g Per Tube Daily   Continuous Infusions: . sodium chloride 100 mL/hr at 10/20/20 0241  . sodium chloride 50 mL/hr at 10/20/20 0241  . dexmedetomidine (PRECEDEX) IV infusion Stopped (10/19/20 2209)  . norepinephrine (LEVOPHED) Adult infusion Stopped (10/20/20 0520)  . propofol (DIPRIVAN) infusion 20 mcg/kg/min (10/20/20 0526)     LOS: 1 day    Critical care procedure note Authorized and performed  by: Erick Blinks Total critical care time: Approximately 35 minutes Due to high probability of clinically significant, life-threatening deterioration, the patient required my highest level of preparedness to intervene emergently and I personally spent this critical care time directly and personally managing the patient.  The critical care time included obtaining a history, examining the patient, pulse oximetry, ordering and review of studies, arranging urgent treatment with development of a management plan, evaluation of patient's response to treatment, frequent reassessment, discussions with other providers.  Critical care time was performed to assess and manage the high probability of imminent, life-threatening deterioration that could result in multiorgan failure.  It was exclusive of separate billable procedures and treating other patients and teaching time.  Please see MDM section and the rest of the of note for further information on patient assessment and treatment    Erick Blinks,  MD [facility]   If 7PM-7AM, please contact night-coverage [email or web address]  10/20/2020, 7:17 AM

## 2020-10-20 NOTE — Progress Notes (Signed)
5pm and 5:30pm patient in room, cooperative,awake, and hands visible.

## 2020-10-20 NOTE — Progress Notes (Signed)
Patient's MRSA PCR came back positive. Mupirocin ordered.

## 2020-10-21 LAB — COMPREHENSIVE METABOLIC PANEL
ALT: 78 U/L — ABNORMAL HIGH (ref 0–44)
AST: 29 U/L (ref 15–41)
Albumin: 3 g/dL — ABNORMAL LOW (ref 3.5–5.0)
Alkaline Phosphatase: 68 U/L (ref 38–126)
Anion gap: 6 (ref 5–15)
BUN: 8 mg/dL (ref 6–20)
CO2: 26 mmol/L (ref 22–32)
Calcium: 8.2 mg/dL — ABNORMAL LOW (ref 8.9–10.3)
Chloride: 103 mmol/L (ref 98–111)
Creatinine, Ser: 0.73 mg/dL (ref 0.61–1.24)
GFR, Estimated: >60 mL/min (ref >60)
Glucose, Bld: 106 mg/dL — ABNORMAL HIGH (ref 70–99)
Potassium: 3.7 mmol/L (ref 3.5–5.1)
Sodium: 135 mmol/L (ref 135–145)
Total Bilirubin: 1.1 mg/dL (ref 0.3–1.2)
Total Protein: 5.8 g/dL — ABNORMAL LOW (ref 6.5–8.1)

## 2020-10-21 LAB — CBC
HCT: 43.8 % (ref 39.0–52.0)
Hemoglobin: 14.3 g/dL (ref 13.0–17.0)
MCH: 31.8 pg (ref 26.0–34.0)
MCHC: 32.6 g/dL (ref 30.0–36.0)
MCV: 97.6 fL (ref 80.0–100.0)
Platelets: 154 10*3/uL (ref 150–400)
RBC: 4.49 MIL/uL (ref 4.22–5.81)
RDW: 12.7 % (ref 11.5–15.5)
WBC: 12.2 10*3/uL — ABNORMAL HIGH (ref 4.0–10.5)
nRBC: 0 % (ref 0.0–0.2)

## 2020-10-21 LAB — URINE CULTURE: Culture: NO GROWTH

## 2020-10-21 MED ORDER — POLYETHYLENE GLYCOL 3350 17 G PO PACK
17.0000 g | PACK | Freq: Every day | ORAL | Status: DC
  Filled 2020-10-21: qty 1

## 2020-10-21 MED ORDER — DOCUSATE SODIUM 100 MG PO CAPS
100.0000 mg | ORAL_CAPSULE | Freq: Two times a day (BID) | ORAL | Status: DC
  Filled 2020-10-21 (×3): qty 1

## 2020-10-21 MED ORDER — HYDROCOD POLST-CPM POLST ER 10-8 MG/5ML PO SUER
5.0000 mL | Freq: Two times a day (BID) | ORAL | Status: DC | PRN
  Administered 2020-10-21: 5 mL via ORAL
  Filled 2020-10-21: qty 5

## 2020-10-21 NOTE — Progress Notes (Signed)
Patient arrived to room 313, from ICU.Report given by Shon Baton RN, Vital signs stable. Room searched and all potentially harmful objects removed. Magnet sign placed outside door.1:1 monitor remains with patient.Marland Kitchen

## 2020-10-21 NOTE — Progress Notes (Signed)
PROGRESS NOTE    ASHAZ ROBLING  YTK:[id number] DOB: [birth year] DOA: 10/19/2020 PCP: Pcp, No    Brief Narrative:  40 y/o male with history of Bipolar, alcohol abuse, admitted after intentional overdose. He was unresponsive on arrival and intubated in ED since it was felt that he was unable to protect airway. Had transient hypotension after admission which was likely related to meds. Blood pressures have since stabilized. He was extubated on 4/27 and respiratory status has been stable. He was seen by psychiatry with recommendations for inpatient psychiatry. He is medically cleared for discharge once a bed is available   Assessment & Plan:   Principal Problem:   Overdose Active Problems:   Intentional overdose of drug in tablet form (HCC)   Essential hypertension   History of alcohol use disorder   Acute respiratory failure (HCC)   Transaminitis   Hypothermia   Bipolar disorder (HCC)   Intentional overdose -patient had taken 2 grams of seroquel, kratom and alcohol -received activated charcoal per poison control -continue to monitor for arrhythmias -seen by psychiatry with recommendations for inpatient psychiatry  Acute toxic encephalopathy -related to overdose of medications/alcohol -mental status now back to baseline  Acute respiratory failure -patient was intubated in ED due to concerns that he was not able to protect his airway -he was able to extubate on 4/27 -respiratory status has been stable   History of alcohol dependence -per review of records, he does have a history of DTs -continue to monitor on CIWA protocol with ativan -current CIWA score is 5 -continue on thiamine and folate  Hypertension -chronically on lisinopril and amlodipine which have been resumed -BP currently stable   Bipolar disorder -chronically on seroquel, depakote, cariprazine -holding all meds since he was altered on admission -will defer to psych regarding resuming meds  Elevated  LFTs -suspect this is related to alcohol -trending down   DVT prophylaxis: SCDs Start: 10/19/20 2059  Code Status: full code Family Communication: no family at bedside Disposition Plan: Status is: Inpatient  Remains inpatient appropriate because:Inpatient level of care appropriate due to severity of illness. He is medically stable to discharge to psychiatry once a bed is available   Dispo: The patient is from: Home              Anticipated d/c is to: inpatient psychiatry              Patient currently is medically stable to d/c.   Difficult to place patient No         Consultants:   PCCM  Procedures:   ETT 4/26>  Antimicrobials:       Subjective: Intubated and sedated. Was agitated overnight and is currently in wrist restraints. He had some hypotension overnight and briefly required levophed, but has since been weaned off.   Objective: Vitals:   10/21/20 0630 10/21/20 0700 10/21/20 0730 10/21/20 0800  BP: 122/84 122/89 116/88 125/83  Pulse: 94 100 97 92  Resp: (!) 22 19 (!) 22   Temp:      TempSrc:      SpO2: 92%  96%   Weight:      Height:        Intake/Output Summary (Last 24 hours) at 10/21/2020 0826 Last data filed at 10/21/2020 0756 Gross per 24 hour  Intake 3299.58 ml  Output 1500 ml  Net 1799.58 ml   Filed Weights   10/19/20 1630 10/20/20 0411  Weight: 99.8 kg 103.2 kg    Examination:  General exam: Alert, awake, oriented x 3 Respiratory system: coarse breath sounds bilaterally. Respiratory effort normal. Cardiovascular system:RRR. No murmurs, rubs, gallops. Gastrointestinal system: Abdomen is nondistended, soft and nontender. No organomegaly or masses felt. Normal bowel sounds heard. Central nervous system: Alert and oriented. No focal neurological deficits. Extremities: No C/C/E, +pedal pulses Skin: No rashes, lesions or ulcers Psychiatry: Judgement and insight appear normal. Mood & affect appropriate.      Data Reviewed: I have  personally reviewed following labs and imaging studies  CBC: Recent Labs  Lab 10/19/20 1628 10/20/20 0531 10/21/20 0551  WBC 8.3 7.4 12.2*  NEUTROABS 4.1 5.2  --   HGB 16.8 13.8 14.3  HCT 50.3 42.8 43.8  MCV 96.5 97.7 97.6  PLT 200 166 154   Basic Metabolic Panel: Recent Labs  Lab 10/19/20 1628 10/19/20 2105 10/20/20 0531 10/21/20 0551  NA 135 138 139 135  K 3.8 3.8 3.9 3.7  CL 102 104 107 103  CO2 22 20* 22 26  GLUCOSE 119* 111* 86 106*  BUN 5* 6 5* 8  CREATININE 0.67 0.58* 0.64 0.73  CALCIUM 8.5* 7.6* 8.1* 8.2*  MG  --  1.8 1.9  --   PHOS  --  4.4  --   --    GFR: Estimated Creatinine Clearance: 144.3 mL/min (by C-G formula based on SCr of 0.73 mg/dL). Liver Function Tests: Recent Labs  Lab 10/19/20 1628 10/20/20 0531 10/21/20 0551  AST 127* 68* 29  ALT 179* 121* 78*  ALKPHOS 78 56 68  BILITOT 0.6 0.5 1.1  PROT 7.3 5.5* 5.8*  ALBUMIN 4.1 3.0* 3.0*   No results for input(s): LIPASE, AMYLASE in the last 168 hours. No results for input(s): AMMONIA in the last 168 hours. Coagulation Profile: Recent Labs  Lab 10/19/20 1628  INR 1.0   Cardiac Enzymes: No results for input(s): CKTOTAL, CKMB, CKMBINDEX, TROPONINI in the last 168 hours. BNP (last 3 results) No results for input(s): PROBNP in the last 8760 hours. HbA1C: No results for input(s): HGBA1C in the last 72 hours. CBG: Recent Labs  Lab 10/19/20 1629 10/20/20 0720  GLUCAP 122* 94   Lipid Profile: Recent Labs    10/20/20 0531  TRIG 178*   Thyroid Function Tests: No results for input(s): TSH, T4TOTAL, FREET4, T3FREE, THYROIDAB in the last 72 hours. Anemia Panel: No results for input(s): VITAMINB12, FOLATE, FERRITIN, TIBC, IRON, RETICCTPCT in the last 72 hours. Sepsis Labs: Recent Labs  Lab 10/19/20 1636 10/19/20 1744  LATICACIDVEN 2.0* 2.1*    Recent Results (from the past 240 hour(s))  Urine culture     Status: None   Collection Time: 10/19/20  4:28 PM   Specimen: In/Out Cath  Urine  Result Value Ref Range Status   Specimen Description   Final    IN/OUT CATH URINE Performed at [facility], [address] [place], [place] [address]    Special Requests   Final    NONE Performed at [facility], [address], [place], [place] [address]    Culture   Final    NO GROWTH Performed at [facility], [address], [place], [place] [address]    Report Status 10/21/2020 FINAL  Final  Resp Panel by RT-PCR (Flu A&B, Covid) Nasopharyngeal Swab     Status: None   Collection Time: 10/19/20  4:36 PM   Specimen: Nasopharyngeal Swab; Nasopharyngeal(NP) swabs in vial transport medium  Result Value Ref Range Status   SARS Coronavirus 2 by RT  PCR NEGATIVE NEGATIVE Final    Comment: (NOTE) SARS-CoV-2 target nucleic acids are NOT DETECTED.  The SARS-CoV-2 RNA is generally detectable in upper respiratory specimens during the acute phase of infection. The lowest concentration of SARS-CoV-2 viral copies this assay can detect is 138 copies/mL. A negative result does not preclude SARS-Cov-2 infection and should not be used as the sole basis for treatment or other patient management decisions. A negative result may occur with  improper specimen collection/handling, submission of specimen other than nasopharyngeal swab, presence of viral mutation(s) within the areas targeted by this assay, and inadequate number of viral copies(<138 copies/mL). A negative result must be combined with clinical observations, patient history, and epidemiological information. The expected result is Negative.  Fact Sheet for Patients:  BloggerCourse.com  Fact Sheet for Healthcare Providers:  SeriousBroker.it  This test is no t yet approved or cleared by the [place] FDA and  has been authorized for detection and/or diagnosis of SARS-CoV-2 by FDA under an Emergency Use Authorization (EUA). This EUA will remain  in effect (meaning this  test can be used) for the duration of the COVID-19 declaration under Section 564(b)(1) of the Act, 21 U.S.C.section 360bbb-3(b)(1), unless the authorization is terminated  or revoked sooner.       Influenza A by PCR NEGATIVE NEGATIVE Final   Influenza B by PCR NEGATIVE NEGATIVE Final    Comment: (NOTE) The Xpert Xpress SARS-CoV-2/FLU/RSV plus assay is intended as an aid in the diagnosis of influenza from Nasopharyngeal swab specimens and should not be used as a sole basis for treatment. Nasal washings and aspirates are unacceptable for Xpert Xpress SARS-CoV-2/FLU/RSV testing.  Fact Sheet for Patients: BloggerCourse.com  Fact Sheet for Healthcare Providers: SeriousBroker.it  This test is not yet approved or cleared by the [place] FDA and has been authorized for detection and/or diagnosis of SARS-CoV-2 by FDA under an Emergency Use Authorization (EUA). This EUA will remain in effect (meaning this test can be used) for the duration of the COVID-19 declaration under Section 564(b)(1) of the Act, 21 U.S.C. section 360bbb-3(b)(1), unless the authorization is terminated or revoked.  Performed at [facility], [address], [place], [place] [address]   Blood culture (routine single)     Status: None (Preliminary result)   Collection Time: 10/19/20  5:44 PM   Specimen: Site Not Specified; Blood  Result Value Ref Range Status   Specimen Description SITE NOT SPECIFIED  Final   Special Requests   Final    BOTTLES DRAWN AEROBIC AND ANAEROBIC Blood Culture adequate volume   Culture   Final    NO GROWTH 2 DAYS Performed at [facility], [address], [place], [place] [address]    Report Status PENDING  Incomplete  MRSA PCR Screening     Status: Abnormal   Collection Time: 10/20/20  9:53 AM   Specimen: Nasopharyngeal  Result Value Ref Range Status   MRSA by PCR POSITIVE (A) NEGATIVE Final    Comment:        The GeneXpert MRSA  Assay (FDA approved for NASAL specimens only), is one component of a comprehensive MRSA colonization surveillance program. It is not intended to diagnose MRSA infection nor to guide or monitor treatment for MRSA infections. RESULT CALLED TO, READ BACK BY AND VERIFIED WITH: LOOMIS K. AT 1612 ON [id number] BY THOMPSON S. Performed at [facility], [address], [place], [place] [address]          Radiology Studies: DG CHEST PORT 1  VIEW  Result Date: 10/20/2020 CLINICAL DATA:  Respiratory failure EXAM: PORTABLE CHEST 1 VIEW COMPARISON:  10/19/2020. FINDINGS: Endotracheal tube 5.2 cm above the carina. Nasogastric tube extends into the upper abdomen. Pulmonary insufflation is improved. The lungs are clear. No pneumothorax or pleural effusion. Cardiac size within normal limits. Pulmonary vascularity is normal. IMPRESSION: Stable support tubes. Improved pulmonary insufflation. No focal pulmonary infiltrate. Are Electronically Signed   By: Helyn NumbersAshesh  Parikh MD   On: 10/20/2020 04:34   DG Chest Port 1 View  Result Date: 10/19/2020 CLINICAL DATA:  Drug overdose. Possible sepsis. Endotracheal tube placement. EXAM: PORTABLE CHEST 1 VIEW COMPARISON:  07/06/2020 FINDINGS: Endotracheal tube and nasogastric tube are seen in appropriate position. Low lung volumes are again noted. Bibasilar atelectasis is seen, without evidence of pulmonary consolidation or edema. No evidence of pleural effusion. Stable heart size. IMPRESSION: Endotracheal tube and nasogastric tube in appropriate position. Low lung volumes with bibasilar atelectasis. Electronically Signed   By: Danae OrleansJohn A Stahl M.D.   On: 10/19/2020 17:44        Scheduled Meds: . amLODipine  10 mg Oral Daily  . Chlorhexidine Gluconate Cloth  6 each Topical Daily  . docusate  100 mg Per Tube BID  . enoxaparin (LOVENOX) injection  50 mg Subcutaneous Q24H  . lisinopril  10 mg Oral Daily  . LORazepam  0-4 mg Intravenous Q4H   Followed by  . [START ON  10/22/2020] LORazepam  0-4 mg Intravenous Q8H  . multivitamin with minerals  1 tablet Oral Daily  . mupirocin ointment   Nasal BID  . nicotine  21 mg Transdermal Daily  . polyethylene glycol  17 g Per Tube Daily  . polyethylene glycol  17 g Per Tube Daily  . thiamine injection  100 mg Intravenous Daily   Continuous Infusions: . sodium chloride Stopped (10/20/20 0530)     LOS: 2 days    Time spent: 35 mins   Erick BlinksJehanzeb Cornelious Bartolucci, MD [facility]   If 7PM-7AM, please contact night-coverage [email or web address]  10/21/2020, 8:26 AM

## 2020-10-21 NOTE — BHH Counselor (Signed)
[name], FNP recommends in patient treatment once medically cleared. [facility] ICU notified of disposition.

## 2020-10-21 NOTE — BH Assessment (Signed)
Comprehensive Clinical Assessment (CCA) Note  10/21/2020 Ryan Faulkner [id number]   Disposition: Ryan Heater, FNP recommends in patient treatment once medically clear. CSW to seek placement. Patient poses high risk for suicide and 1:1 sitter is recommended.  The patient demonstrates the following risk factors for suicide: Chronic risk factors for suicide include: psychiatric disorder of Bipolar I, previous suicide attempts several and demographic factors (male, >75 y/o). Acute risk factors for suicide include: family or marital conflict and loss (financial, interpersonal, professional). Protective factors for this patient include: positive social support. Considering these factors, the overall suicide risk at this point appears to be high. Patient is not appropriate for outpatient follow up.  Flowsheet Row ED to Hosp-Admission (Current) from 10/19/2020 in [facility] Admission (Discharged) from 07/14/2020 in [facility] ED to Hosp-Admission (Discharged) from 07/06/2020 in [facility]  C-SSRS RISK CATEGORY High Risk Error: Q3, 4, or 5 should not be populated when Q2 is No Error: Q3, 4, or 5 should not be populated when Q2 is No     Patient is a 40 year old male presenting voluntarily to [facility] ED via EMS after an intentional overdose on Seroquel. Patient required intubation and admission to ICU. Upon this counselor's exam patient is calm and cooperative. Patient reports he intentionally overdosed on 45 400 mg of Seroquel. Patient reports is diagnosed with Bipolar and was seeing Ryan Sievert, PA in the past but discontinued services with him because everything was virtual and he did not find it helpful. Patient ran out of his Depakote and states that's why he was not thinking clearly, triggering his overdose. He denies SI/HI/AVH at this time. Per chart review patient has a history of multiple admissions of intentional overdose, most recently at [facility]  in February. He reports daily alcohol use. He states he drinks anywhere from 3-4 beers on a work day up to a 12 pack when he does not have to work. He denies any other substance use.   Chief Complaint:  Chief Complaint  Patient presents with  . Drug Overdose   Visit Diagnosis: F31.4 Bipolar I, current episode depressed, severe   CCA Biopsychosocial Intake/Chief Complaint:  NA  Current Symptoms/Problems: NA   Patient Reported Schizophrenia/Schizoaffective Diagnosis in Past: No   Strengths: NA  Preferences: NA  Abilities: NA   Type of Services Patient Feels are Needed: NA   Initial Clinical Notes/Concerns: NA   Mental Health Symptoms Depression:  Difficulty Concentrating; Hopelessness; Sleep (too much or little); Tearfulness   Duration of Depressive symptoms: Greater than two weeks   Mania:  Increased Energy; Irritability; Racing thoughts; Recklessness   Anxiety:   Tension; Worrying; Difficulty concentrating   Psychosis:  None   Duration of Psychotic symptoms: No data recorded  Trauma:  None   Obsessions:  None   Compulsions:  None   Inattention:  None   Hyperactivity/Impulsivity:  N/A   Oppositional/Defiant Behaviors:  N/A   Emotional Irregularity:  Intense/unstable relationships; Potentially harmful impulsivity; Transient, stress-related paranoia/disassociation   Other Mood/Personality Symptoms:  No data recorded   Mental Status Exam Appearance and self-care  Stature:  Average   Weight:  Average weight   Clothing:  Neat/clean   Grooming:  Normal   Cosmetic use:  None   Posture/gait:  Slumped   Motor activity:  Not Remarkable   Sensorium  Attention:  Normal   Concentration:  Normal   Orientation:  X5   Recall/memory:  Normal   Affect  and Mood  Affect:  Flat   Mood:  Depressed   Relating  Eye contact:  Normal   Facial expression:  Depressed   Attitude toward examiner:  Cooperative   Thought and Language  Speech flow:  Clear and Coherent   Thought content:  Appropriate to Mood and Circumstances   Preoccupation:  None   Hallucinations:  None   Organization:  No data recorded  [employer] of Knowledge:  Good   Intelligence:  Average   Abstraction:  Normal   Judgement:  Impaired   Reality Testing:  Realistic   Insight:  Lacking   Decision Making:  Impulsive   Social Functioning  Social Maturity:  Impulsive   Social Judgement:  Heedless   Stress  Stressors:  Housing; Relationship; Illness   Coping Ability:  Deficient supports   Skill Deficits:  Decision making; Interpersonal; Self-control   Supports:  Support needed     Religion: Religion/Spirituality Are You A Religious Person?: No  Leisure/Recreation: Leisure / Recreation Do You Have Hobbies?: No  Exercise/Diet: Exercise/Diet Do You Exercise?: No Have You Gained or Lost A Significant Amount of Weight in the Past Six Months?: No Do You Follow a Special Diet?: No Do You Have Any Trouble Sleeping?: Yes Explanation of Sleeping Difficulties: reports if he does not take Seroquel poor sleep   CCA Employment/Education Employment/Work Situation: Employment / Work Situation Employment situation: Employed 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 [employer] long has patient been employed?: 1 year Patient's job has been impacted by current illness: No What is the longest time patient has a held a job?: NA Where was the patient employed at that time?: NA Has patient ever been in the [employer]?: No  Education: Education Is Patient Currently Attending School?: No Last Grade Completed: 12 Name of High School: NA- school in [place] Did You Graduate From [employer]?: Yes Did Theme park manager?: No Did Designer, television/film set?: No Did You Have An Individualized Education Program (IIEP): No Did You Have Any Difficulty At [employer]?: No Patient's Education Has Been Impacted by Current Illness:  No   CCA Family/Childhood History Family and Relationship History: Family history Marital status: Long term relationship Long term relationship, how long?: UTA What types of issues is patient dealing with in the relationship?: frequent fights Are you sexually active?: Yes What is your sexual orientation?: heterosexual Has your sexual activity been affected by drugs, alcohol, medication, or emotional stress?: NA Does patient have children?: No  Childhood History:  Childhood History By whom was/is the patient raised?: Mother Additional childhood history information: grew up in [place] Description of patient's relationship with caregiver when they were a child: NA Patient's description of current relationship with people who raised him/her: NA How were you disciplined when you got in trouble as a child/adolescent?: mother is a support, but still lives in [place] Does patient have siblings?: No Did patient suffer any verbal/emotional/physical/sexual abuse as a child?: No Did patient suffer from severe childhood neglect?: No Has patient ever been sexually abused/assaulted/raped as an adolescent or adult?: No Was the patient ever a victim of a crime or a disaster?: No Witnessed domestic violence?: No Has patient been affected by domestic violence as an adult?: No  Child/Adolescent Assessment:     CCA Substance Use Alcohol/Drug Use: Alcohol / Drug Use Pain Medications: See MAR Prescriptions: See MAR Over the Counter: See MAR History of alcohol / drug use?: Yes Substance #1 Name of Substance 1:  alcohol 1 - Age of First Use: teens 1 - Amount (size/oz): 4 beers- 12 pack 1 - Frequency: daily 1 - Duration: "along time" 1 - Last Use / Amount: 4/27 UTA amount 1 - Method of Aquiring: purcahsed 1- Route of Use: drink                       ASAM's:  Six Dimensions of Multidimensional Assessment  Dimension 1:  Acute Intoxication and/or Withdrawal Potential:   Dimension 1:   Description of individual's past and current experiences of substance use and withdrawal: daily alcohol use  Dimension 2:  Biomedical Conditions and Complications:   Dimension 2:  Description of patient's biomedical conditions and  complications: high blood pressure  Dimension 3:  Emotional, Behavioral, or Cognitive Conditions and Complications:  Dimension 3:  Description of emotional, behavioral, or cognitive conditions and complications: bipolar disorder  Dimension 4:  Readiness to Change:  Dimension 4:  Description of Readiness to Change criteria: precontemplative  Dimension 5:  Relapse, Continued use, or Continued Problem Potential:  Dimension 5:  Relapse, continued use, or continued problem potential critiera description: does not view use as an issue  Dimension 6:  Recovery/Living Environment:  Dimension 6:  Recovery/Iiving environment criteria description: homelss, living in different hotels  ASAM Severity Score: ASAM's Severity Rating Score: 13  ASAM Recommended Level of Treatment: ASAM Recommended Level of Treatment: Level II Intensive Outpatient Treatment   Substance use Disorder (SUD) Substance Use Disorder (SUD)  Checklist Symptoms of Substance Use: Continued use despite having a persistent/recurrent physical/psychological problem caused/exacerbated by use,Evidence of tolerance,Evidence of withdrawal (Comment),Presence of craving or strong urge to use  Recommendations for Services/Supports/Treatments: Recommendations for Services/Supports/Treatments Recommendations For Services/Supports/Treatments: IOP (Intensive Outpatient Program)  DSM5 Diagnoses: Patient Active Problem List   Diagnosis Date Noted  . Bipolar disorder (HCC) 10/20/2020  . Overdose 10/19/2020  . History of alcohol use disorder 10/19/2020  . Acute respiratory failure (HCC) 10/19/2020  . Transaminitis 10/19/2020  . Hypothermia 10/19/2020  . Severe bipolar I disorder, current or most recent episode depressed (HCC)  07/14/2020  . Intentional overdose of drug in tablet form (HCC) 07/06/2020  . Essential hypertension 07/06/2020  . Tobacco dependence 07/06/2020  . Class 1 obesity due to excess calories with body mass index (BMI) of 30.0 to 30.9 in adult 07/06/2020  . 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 Alcohol use disorder, severe, dependence (HCC) 02/15/2020  . Delirium tremens (HCC) 12/16/2018  . Alcohol abuse 06/27/2018    Patient Centered Plan: Patient is on the following Treatment Plan(s):     Referrals to Alternative Service(s): Referred to Alternative Service(s):   Place:   Date:   Time:    Referred to Alternative Service(s):   Place:   Date:   Time:    Referred to Alternative Service(s):   Place:   Date:   Time:    Referred to Alternative Service(s):   Place:   Date:   Time:     Celedonio Miyamoto, LCSW

## 2020-10-22 DIAGNOSIS — Z87898 Personal history of other specified conditions: Secondary | ICD-10-CM

## 2020-10-22 NOTE — Progress Notes (Signed)
Pts spouse came to get pts money and keys. Spouse was checked  by security

## 2020-10-22 NOTE — BH Assessment (Addendum)
Ryan Faulkner, [facility] at [facility], states Pt has been accepted to the service of Dr. Jola Babinski, room 401-1. [name] contacted hospital staff and notified of acceptance.   [name], [facility], [facility] [place] Triage Specialist [phone]

## 2020-10-22 NOTE — Discharge Summary (Signed)
Physician Discharge Summary  Ryan Faulkner AUQ:[id number] DOB: 1980-10-05 DOA: 10/19/2020  PCP: Oneita Hurt, No  Admit date: 10/19/2020 Discharge date: 10/22/2020  Admitted From: home Disposition:  Inpatient psychiatry  Recommendations for Outpatient Follow-up:  1. Patient to be discharged to [facility] for further management  Discharge Condition:stable CODE STATUS:full code Diet recommendation: regular diet  Brief/Interim Summary: 40 y/o male with history of Bipolar, alcohol abuse, admitted after intentional overdose. He was unresponsive on arrival and intubated in ED since it was felt that he was unable to protect airway. Had transient hypotension after admission which was likely related to meds. Blood pressures have since stabilized. He was extubated on 4/27 and respiratory status has been stable. He was seen by psychiatry with recommendations for inpatient psychiatry. He is medically cleared for discharge once a bed is available. Currently he is willing to go to [facility] voluntarily  Discharge Diagnoses:  Principal Problem:   Overdose Active Problems:   Intentional overdose of drug in tablet form (HCC)   Essential hypertension   Severe bipolar I disorder, current or most recent episode depressed (HCC)   History of alcohol use disorder   Acute respiratory failure (HCC)   Transaminitis   Hypothermia   Bipolar disorder (HCC)  Intentional overdose -patient had taken 2 grams of seroquel, kratom and alcohol -received activated charcoal per poison control -he did not develop any significant arrhythmias -seen by psychiatry with recommendations for inpatient psychiatry -currently he is willing to go voluntarily  Acute toxic encephalopathy -related to overdose of medications/alcohol -mental status now back to baseline  Acute respiratory failure -patient was intubated in ED due to concerns that he was not able to protect his airway -he was able to extubate on  4/27 -respiratory status has been stable   History of alcohol dependence -per review of records, he does have a history of DTs -continue to monitor on CIWA protocol with ativan -currently appears to be calm, is not tremulous, seems to be well managed with ativan  Hypertension -chronically on lisinopril and amlodipine which have been resumed -BP currently stable   Bipolar disorder -chronically on seroquel, depakote, cariprazine -holding all meds since he was altered on admission -will defer to psych regarding resuming meds  Elevated LFTs -suspect this is related to alcohol -trending down  Discharge Instructions  Discharge Instructions    Diet - low sodium heart healthy   Complete by: As directed    Increase activity slowly   Complete by: As directed      Allergies as of 10/22/2020      Reactions   Bee Venom Anaphylaxis      Medication List    STOP taking these medications   sulfamethoxazole-trimethoprim 800-160 MG tablet Commonly known as: BACTRIM DS     TAKE these medications   acetaminophen 325 MG tablet Commonly known as: TYLENOL Take 2 tablets (650 mg total) by mouth every 6 (six) hours as needed for mild pain or moderate pain.   amLODipine 10 MG tablet Commonly known as: NORVASC Take 1 tablet (10 mg total) by mouth daily.   busPIRone 10 MG tablet Commonly known as: BUSPAR Take 10 mg by mouth 3 (three) times daily.   cariprazine capsule Commonly known as: VRAYLAR Take 1 capsule (1.5 mg total) by mouth daily.   divalproex 250 MG DR tablet Commonly known as: DEPAKOTE Take 1 tablet (250 mg total) by mouth every 12 (twelve) hours.   EPINEPHrine 0.3 mg/0.3 mL Soaj injection Commonly known as: EPI-PEN Inject  0.3 mg into the muscle as needed for anaphylaxis.   lisinopril 10 MG tablet Commonly known as: ZESTRIL Take 1 tablet (10 mg total) by mouth daily.   naproxen 500 MG tablet Commonly known as: Naprosyn Take 1 tablet (500 mg total) by  mouth 2 (two) times daily with a meal.   QUEtiapine 200 MG tablet Commonly known as: SEROQUEL Take 200 mg by mouth at bedtime.   thiamine 100 MG tablet Take 100 mg by mouth daily. For alcohol withdrawal   triamcinolone cream 0.1 % Commonly known as: KENALOG Apply topically 2 (two) times daily.       Allergies  Allergen Reactions  . Bee Venom Anaphylaxis    Consultations:  TTS/Psychiatry   Procedures/Studies: DG CHEST PORT 1 VIEW  Result Date: 10/20/2020 CLINICAL DATA:  Respiratory failure EXAM: PORTABLE CHEST 1 VIEW COMPARISON:  10/19/2020. FINDINGS: Endotracheal tube 5.2 cm above the carina. Nasogastric tube extends into the upper abdomen. Pulmonary insufflation is improved. The lungs are clear. No pneumothorax or pleural effusion. Cardiac size within normal limits. Pulmonary vascularity is normal. IMPRESSION: Stable support tubes. Improved pulmonary insufflation. No focal pulmonary infiltrate. Are Electronically Signed   By: Helyn NumbersAshesh  Parikh MD   On: 10/20/2020 04:34   DG Chest Port 1 View  Result Date: 10/19/2020 CLINICAL DATA:  Drug overdose. Possible sepsis. Endotracheal tube placement. EXAM: PORTABLE CHEST 1 VIEW COMPARISON:  07/06/2020 FINDINGS: Endotracheal tube and nasogastric tube are seen in appropriate position. Low lung volumes are again noted. Bibasilar atelectasis is seen, without evidence of pulmonary consolidation or edema. No evidence of pleural effusion. Stable heart size. IMPRESSION: Endotracheal tube and nasogastric tube in appropriate position. Low lung volumes with bibasilar atelectasis. Electronically Signed   By: Danae OrleansJohn A Stahl M.D.   On: 10/19/2020 17:44       Subjective: He is agitated about not being able to discharge home and having to go to inpatient psychiatry  Discharge Exam: Vitals:   10/21/20 2148 10/22/20 0206 10/22/20 0846 10/22/20 1200  BP: 122/81 (!) 153/106 (!) 138/98 (!) 146/99  Pulse: 97 95 91 (!) 108  Resp: 17 15    Temp: 99.1 F  (37.3 C) 98.7 F (37.1 C)    TempSrc: Oral Oral    SpO2: 95% 98%    Weight:      Height:        General: Pt is alert, awake, not in acute distress Cardiovascular: RRR, S1/S2 +, no rubs, no gallops Respiratory: CTA bilaterally, no wheezing, no rhonchi Abdominal: Soft, NT, ND, bowel sounds + Extremities: no edema, no cyanosis    The results of significant diagnostics from this hospitalization (including imaging, microbiology, ancillary and laboratory) are listed below for reference.     Microbiology: Recent Results (from the past 240 hour(s))  Urine culture     Status: None   Collection Time: 10/19/20  4:28 PM   Specimen: In/Out Cath Urine  Result Value Ref Range Status   Specimen Description   Final    IN/OUT CATH URINE Performed at [facility], [address] [place], [place] [address]    Special Requests   Final    NONE Performed at [facility], [address] [place], [place] [address]    Culture   Final    NO GROWTH Performed at [facility], [address] [place], [place] [address]    Report Status 10/21/2020 FINAL  Final  Resp Panel by RT-PCR (Flu A&B, Covid) Nasopharyngeal Swab     Status:  None   Collection Time: 10/19/20  4:36 PM   Specimen: Nasopharyngeal Swab; Nasopharyngeal(NP) swabs in vial transport medium  Result Value Ref Range Status   SARS Coronavirus 2 by RT PCR NEGATIVE NEGATIVE Final    Comment: (NOTE) SARS-CoV-2 target nucleic acids are NOT DETECTED.  The SARS-CoV-2 RNA is generally detectable in upper respiratory specimens during the acute phase of infection. The lowest concentration of SARS-CoV-2 viral copies this assay can detect is 138 copies/mL. A negative result does not preclude SARS-Cov-2 infection and should not be used as the sole basis for treatment or other patient management decisions. A negative result may occur with  improper specimen collection/handling, submission of specimen other than nasopharyngeal swab, presence  of viral mutation(s) within the areas targeted by this assay, and inadequate number of viral copies(<138 copies/mL). A negative result must be combined with clinical observations, patient history, and epidemiological information. The expected result is Negative.  Fact Sheet for Patients:  BloggerCourse.com  Fact Sheet for Healthcare Providers:  SeriousBroker.it  This test is no t yet approved or cleared by the [place] FDA and  has been authorized for detection and/or diagnosis of SARS-CoV-2 by FDA under an Emergency Use Authorization (EUA). This EUA will remain  in effect (meaning this test can be used) for the duration of the COVID-19 declaration under Section 564(b)(1) of the Act, 21 U.S.C.section 360bbb-3(b)(1), unless the authorization is terminated  or revoked sooner.       Influenza A by PCR NEGATIVE NEGATIVE Final   Influenza B by PCR NEGATIVE NEGATIVE Final    Comment: (NOTE) The Xpert Xpress SARS-CoV-2/FLU/RSV plus assay is intended as an aid in the diagnosis of influenza from Nasopharyngeal swab specimens and should not be used as a sole basis for treatment. Nasal washings and aspirates are unacceptable for Xpert Xpress SARS-CoV-2/FLU/RSV testing.  Fact Sheet for Patients: BloggerCourse.com  Fact Sheet for Healthcare Providers: SeriousBroker.it  This test is not yet approved or cleared by the [place] FDA and has been authorized for detection and/or diagnosis of SARS-CoV-2 by FDA under an Emergency Use Authorization (EUA). This EUA will remain in effect (meaning this test can be used) for the duration of the COVID-19 declaration under Section 564(b)(1) of the Act, 21 U.S.C. section 360bbb-3(b)(1), unless the authorization is terminated or revoked.  Performed at [facility], [address] [place], [place] [address]   Blood culture (routine single)      Status: None (Preliminary result)   Collection Time: 10/19/20  5:44 PM   Specimen: Site Not Specified; Blood  Result Value Ref Range Status   Specimen Description SITE NOT SPECIFIED  Final   Special Requests   Final    BOTTLES DRAWN AEROBIC AND ANAEROBIC Blood Culture adequate volume   Culture   Final    NO GROWTH 3 DAYS Performed at [facility], [address] [place], [place] [address]    Report Status PENDING  Incomplete  MRSA PCR Screening     Status: Abnormal   Collection Time: 10/20/20  9:53 AM   Specimen: Nasopharyngeal  Result Value Ref Range Status   MRSA by PCR POSITIVE (A) NEGATIVE Final    Comment:        The GeneXpert MRSA Assay (FDA approved for NASAL specimens only), is one component of a comprehensive MRSA colonization surveillance program. It is not intended to diagnose MRSA infection nor to guide or monitor treatment for MRSA infections. RESULT CALLED TO, READ BACK BY AND VERIFIED WITH: LOOMIS K.  AT 1612 ON [id number] BY THOMPSON S. Performed at [facility], [address], [place], [place] [address]      Labs: BNP (last 3 results) No results for input(s): BNP in the last 8760 hours. Basic Metabolic Panel: Recent Labs  Lab 10/19/20 1628 10/19/20 2105 10/20/20 0531 10/21/20 0551  NA 135 138 139 135  K 3.8 3.8 3.9 3.7  CL 102 104 107 103  CO2 22 20* 22 26  GLUCOSE 119* 111* 86 106*  BUN 5* 6 5* 8  CREATININE 0.67 0.58* 0.64 0.73  CALCIUM 8.5* 7.6* 8.1* 8.2*  MG  --  1.8 1.9  --   PHOS  --  4.4  --   --    Liver Function Tests: Recent Labs  Lab 10/19/20 1628 10/20/20 0531 10/21/20 0551  AST 127* 68* 29  ALT 179* 121* 78*  ALKPHOS 78 56 68  BILITOT 0.6 0.5 1.1  PROT 7.3 5.5* 5.8*  ALBUMIN 4.1 3.0* 3.0*   No results for input(s): LIPASE, AMYLASE in the last 168 hours. No results for input(s): AMMONIA in the last 168 hours. CBC: Recent Labs  Lab 10/19/20 1628 10/20/20 0531 10/21/20 0551  WBC 8.3 7.4 12.2*  NEUTROABS 4.1 5.2  --    HGB 16.8 13.8 14.3  HCT 50.3 42.8 43.8  MCV 96.5 97.7 97.6  PLT 200 166 154   Cardiac Enzymes: No results for input(s): CKTOTAL, CKMB, CKMBINDEX, TROPONINI in the last 168 hours. BNP: Invalid input(s): POCBNP CBG: Recent Labs  Lab 10/19/20 1629 10/20/20 0720  GLUCAP 122* 94   D-Dimer No results for input(s): DDIMER in the last 72 hours. Hgb A1c No results for input(s): HGBA1C in the last 72 hours. Lipid Profile Recent Labs    10/20/20 0531  TRIG 178*   Thyroid function studies No results for input(s): TSH, T4TOTAL, T3FREE, THYROIDAB in the last 72 hours.  Invalid input(s): FREET3 Anemia work up No results for input(s): VITAMINB12, FOLATE, FERRITIN, TIBC, IRON, RETICCTPCT in the last 72 hours. Urinalysis    Component Value Date/Time   COLORURINE STRAW (A) 10/19/2020 1628   APPEARANCEUR CLEAR 10/19/2020 1628   APPEARANCEUR Clear 07/01/2014 0844   LABSPEC 1.003 (L) 10/19/2020 1628   LABSPEC 1.015 07/01/2014 0844   PHURINE 5.0 10/19/2020 1628   GLUCOSEU NEGATIVE 10/19/2020 1628   GLUCOSEU Negative 07/01/2014 0844   HGBUR SMALL (A) 10/19/2020 1628   BILIRUBINUR NEGATIVE 10/19/2020 1628   BILIRUBINUR Negative 07/01/2014 0844   KETONESUR NEGATIVE 10/19/2020 1628   PROTEINUR NEGATIVE 10/19/2020 1628   NITRITE NEGATIVE 10/19/2020 1628   LEUKOCYTESUR NEGATIVE 10/19/2020 1628   LEUKOCYTESUR Negative 07/01/2014 0844   Sepsis Labs Invalid input(s): PROCALCITONIN,  WBC,  LACTICIDVEN Microbiology Recent Results (from the past 240 hour(s))  Urine culture     Status: None   Collection Time: 10/19/20  4:28 PM   Specimen: In/Out Cath Urine  Result Value Ref Range Status   Specimen Description   Final    IN/OUT CATH URINE Performed at [facility], [address], [place], [place] [address]    Special Requests   Final    NONE Performed at [facility], [address], [place], [place] [address]    Culture   Final    NO GROWTH Performed at [facility],  [address] [place], [place] [address]    Report Status 10/21/2020 FINAL  Final  Resp Panel by RT-PCR (Flu A&B, Covid) Nasopharyngeal Swab     Status: None  Collection Time: 10/19/20  4:36 PM   Specimen: Nasopharyngeal Swab; Nasopharyngeal(NP) swabs in vial transport medium  Result Value Ref Range Status   SARS Coronavirus 2 by RT PCR NEGATIVE NEGATIVE Final    Comment: (NOTE) SARS-CoV-2 target nucleic acids are NOT DETECTED.  The SARS-CoV-2 RNA is generally detectable in upper respiratory specimens during the acute phase of infection. The lowest concentration of SARS-CoV-2 viral copies this assay can detect is 138 copies/mL. A negative result does not preclude SARS-Cov-2 infection and should not be used as the sole basis for treatment or other patient management decisions. A negative result may occur with  improper specimen collection/handling, submission of specimen other than nasopharyngeal swab, presence of viral mutation(s) within the areas targeted by this assay, and inadequate number of viral copies(<138 copies/mL). A negative result must be combined with clinical observations, patient history, and epidemiological information. The expected result is Negative.  Fact Sheet for Patients:  BloggerCourse.com  Fact Sheet for Healthcare Providers:  SeriousBroker.it  This test is no t yet approved or cleared by the [place] FDA and  has been authorized for detection and/or diagnosis of SARS-CoV-2 by FDA under an Emergency Use Authorization (EUA). This EUA will remain  in effect (meaning this test can be used) for the duration of the COVID-19 declaration under Section 564(b)(1) of the Act, 21 U.S.C.section 360bbb-3(b)(1), unless the authorization is terminated  or revoked sooner.       Influenza A by PCR NEGATIVE NEGATIVE Final   Influenza B by PCR NEGATIVE NEGATIVE Final    Comment: (NOTE) The Xpert Xpress  SARS-CoV-2/FLU/RSV plus assay is intended as an aid in the diagnosis of influenza from Nasopharyngeal swab specimens and should not be used as a sole basis for treatment. Nasal washings and aspirates are unacceptable for Xpert Xpress SARS-CoV-2/FLU/RSV testing.  Fact Sheet for Patients: BloggerCourse.com  Fact Sheet for Healthcare Providers: SeriousBroker.it  This test is not yet approved or cleared by the [place] FDA and has been authorized for detection and/or diagnosis of SARS-CoV-2 by FDA under an Emergency Use Authorization (EUA). This EUA will remain in effect (meaning this test can be used) for the duration of the COVID-19 declaration under Section 564(b)(1) of the Act, 21 U.S.C. section 360bbb-3(b)(1), unless the authorization is terminated or revoked.  Performed at [facility], [address], [place], [place] [address]   Blood culture (routine single)     Status: None (Preliminary result)   Collection Time: 10/19/20  5:44 PM   Specimen: Site Not Specified; Blood  Result Value Ref Range Status   Specimen Description SITE NOT SPECIFIED  Final   Special Requests   Final    BOTTLES DRAWN AEROBIC AND ANAEROBIC Blood Culture adequate volume   Culture   Final    NO GROWTH 3 DAYS Performed at [facility], [address], [place], [place] [address]    Report Status PENDING  Incomplete  MRSA PCR Screening     Status: Abnormal   Collection Time: 10/20/20  9:53 AM   Specimen: Nasopharyngeal  Result Value Ref Range Status   MRSA by PCR POSITIVE (A) NEGATIVE Final    Comment:        The GeneXpert MRSA Assay (FDA approved for NASAL specimens only), is one component of a comprehensive MRSA colonization surveillance program. It is not intended to diagnose MRSA infection nor to guide or monitor treatment for MRSA infections. RESULT CALLED TO, READ BACK BY AND VERIFIED WITH: LOOMIS K. AT 1612 ON [id number]  BY THOMPSON  S. Performed at [facility], [address] [place], [place] [address]      Time coordinating discharge:  SIGNED:   Erick Blinks, MD  [facility] 10/22/2020, 2:01 PM   If 7PM-7AM, please contact night-coverage [email or web address]

## 2020-10-22 NOTE — Progress Notes (Signed)
Pt under review at [facility].  Penni Homans, MSW, LCSW 10/22/2020 10:54 AM

## 2020-10-22 NOTE — Progress Notes (Signed)
Pt agitated and expresses concern that he wants to leave. Nurse educated pt that he cannot leave he is waiting on bed placement at [facility]. Pt states he understands and is visibly upset.

## 2020-10-23 ENCOUNTER — Inpatient Hospital Stay (HOSPITAL_COMMUNITY)
Admission: RE | Admit: 2020-10-23 | Discharge: 2020-10-27 | DRG: 885 | Disposition: A | Discharge status: Home / Self Care | Payer: BC Managed Care – PPO | Source: Intra-hospital | Attending: Psychiatry | Admitting: Psychiatry

## 2020-10-23 ENCOUNTER — Other Ambulatory Visit: Payer: Self-pay

## 2020-10-23 ENCOUNTER — Encounter (HOSPITAL_COMMUNITY): Payer: Self-pay | Admitting: Family

## 2020-10-23 DIAGNOSIS — Z79899 Other long term (current) drug therapy: Secondary | ICD-10-CM

## 2020-10-23 DIAGNOSIS — F17210 Nicotine dependence, cigarettes, uncomplicated: Secondary | ICD-10-CM | POA: Diagnosis present

## 2020-10-23 DIAGNOSIS — F314 Bipolar disorder, current episode depressed, severe, without psychotic features: Secondary | ICD-10-CM | POA: Diagnosis present

## 2020-10-23 DIAGNOSIS — F10239 Alcohol dependence with withdrawal, unspecified: Secondary | ICD-10-CM | POA: Diagnosis present

## 2020-10-23 DIAGNOSIS — A4902 Methicillin resistant Staphylococcus aureus infection, unspecified site: Secondary | ICD-10-CM | POA: Diagnosis present

## 2020-10-23 DIAGNOSIS — F419 Anxiety disorder, unspecified: Secondary | ICD-10-CM | POA: Diagnosis present

## 2020-10-23 DIAGNOSIS — Z9151 Personal history of suicidal behavior: Secondary | ICD-10-CM | POA: Diagnosis not present

## 2020-10-23 DIAGNOSIS — T43592A Poisoning by other antipsychotics and neuroleptics, intentional self-harm, initial encounter: Secondary | ICD-10-CM | POA: Diagnosis present

## 2020-10-23 DIAGNOSIS — F1020 Alcohol dependence, uncomplicated: Secondary | ICD-10-CM

## 2020-10-23 DIAGNOSIS — I10 Essential (primary) hypertension: Secondary | ICD-10-CM | POA: Diagnosis present

## 2020-10-23 DIAGNOSIS — G4700 Insomnia, unspecified: Secondary | ICD-10-CM | POA: Diagnosis present

## 2020-10-23 DIAGNOSIS — Z818 Family history of other mental and behavioral disorders: Secondary | ICD-10-CM

## 2020-10-23 DIAGNOSIS — T1491XA Suicide attempt, initial encounter: Secondary | ICD-10-CM | POA: Diagnosis not present

## 2020-10-23 LAB — CBC WITH DIFFERENTIAL/PLATELET
Abs Immature Granulocytes: 0.04 10*3/uL (ref 0.00–0.07)
Basophils Absolute: 0.1 10*3/uL (ref 0.0–0.1)
Basophils Relative: 1 %
Eosinophils Absolute: 0.2 10*3/uL (ref 0.0–0.5)
Eosinophils Relative: 3 %
HCT: 46.4 % (ref 39.0–52.0)
Hemoglobin: 15.7 g/dL (ref 13.0–17.0)
Immature Granulocytes: 1 %
Lymphocytes Relative: 26 %
Lymphs Abs: 1.9 10*3/uL (ref 0.7–4.0)
MCH: 32 pg (ref 26.0–34.0)
MCHC: 33.8 g/dL (ref 30.0–36.0)
MCV: 94.5 fL (ref 80.0–100.0)
Monocytes Absolute: 1 10*3/uL (ref 0.1–1.0)
Monocytes Relative: 13 %
Neutro Abs: 4.1 10*3/uL (ref 1.7–7.7)
Neutrophils Relative %: 56 %
Platelets: 283 10*3/uL (ref 150–400)
RBC: 4.91 MIL/uL (ref 4.22–5.81)
RDW: 12.2 % (ref 11.5–15.5)
WBC: 7.2 10*3/uL (ref 4.0–10.5)
nRBC: 0 % (ref 0.0–0.2)

## 2020-10-23 LAB — COMPREHENSIVE METABOLIC PANEL
ALT: 78 U/L — ABNORMAL HIGH (ref 0–44)
AST: 44 U/L — ABNORMAL HIGH (ref 15–41)
Albumin: 3.9 g/dL (ref 3.5–5.0)
Alkaline Phosphatase: 83 U/L (ref 38–126)
Anion gap: 10 (ref 5–15)
BUN: 11 mg/dL (ref 6–20)
CO2: 21 mmol/L — ABNORMAL LOW (ref 22–32)
Calcium: 9.3 mg/dL (ref 8.9–10.3)
Chloride: 105 mmol/L (ref 98–111)
Creatinine, Ser: 0.94 mg/dL (ref 0.61–1.24)
GFR, Estimated: >60 mL/min (ref >60)
Glucose, Bld: 113 mg/dL — ABNORMAL HIGH (ref 70–99)
Potassium: 3.9 mmol/L (ref 3.5–5.1)
Sodium: 136 mmol/L (ref 135–145)
Total Bilirubin: 0.6 mg/dL (ref 0.3–1.2)
Total Protein: 7.5 g/dL (ref 6.5–8.1)

## 2020-10-23 LAB — LIPID PANEL
Cholesterol: 153 mg/dL (ref 0–200)
HDL: 32 mg/dL — ABNORMAL LOW (ref >40)
LDL Cholesterol: 92 mg/dL (ref 0–99)
Total CHOL/HDL Ratio: 4.8 RATIO
Triglycerides: 143 mg/dL (ref <150)
VLDL: 29 mg/dL (ref 0–40)

## 2020-10-23 LAB — HEMOGLOBIN A1C
Hgb A1c MFr Bld: 5.3 % (ref 4.8–5.6)
Mean Plasma Glucose: 105.41 mg/dL

## 2020-10-23 LAB — TSH: TSH: 1.521 u[IU]/mL (ref 0.350–4.500)

## 2020-10-23 MED ORDER — AMLODIPINE BESYLATE 10 MG PO TABS
10.0000 mg | ORAL_TABLET | Freq: Every day | ORAL | Status: DC
  Administered 2020-10-23 – 2020-10-27 (×5): 10 mg via ORAL
  Filled 2020-10-23 (×7): qty 1

## 2020-10-23 MED ORDER — THIAMINE HCL 100 MG PO TABS
100.0000 mg | ORAL_TABLET | Freq: Every day | ORAL | Status: DC
  Administered 2020-10-23 – 2020-10-27 (×5): 100 mg via ORAL
  Filled 2020-10-23 (×7): qty 1

## 2020-10-23 MED ORDER — HYDROXYZINE HCL 25 MG PO TABS
25.0000 mg | ORAL_TABLET | Freq: Four times a day (QID) | ORAL | Status: AC | PRN
Start: 2020-10-23 — End: 2020-10-26
  Administered 2020-10-23 – 2020-10-25 (×6): 25 mg via ORAL
  Filled 2020-10-23 (×6): qty 1

## 2020-10-23 MED ORDER — BUSPIRONE HCL 10 MG PO TABS
10.0000 mg | ORAL_TABLET | Freq: Three times a day (TID) | ORAL | Status: DC
  Administered 2020-10-23 – 2020-10-27 (×13): 10 mg via ORAL
  Filled 2020-10-23 (×9): qty 1
  Filled 2020-10-23 (×2): qty 2
  Filled 2020-10-23 (×9): qty 1

## 2020-10-23 MED ORDER — TRAZODONE HCL 50 MG PO TABS
50.0000 mg | ORAL_TABLET | Freq: Every evening | ORAL | Status: DC | PRN
  Administered 2020-10-23 (×2): 50 mg via ORAL
  Filled 2020-10-23 (×2): qty 1

## 2020-10-23 MED ORDER — LORAZEPAM 1 MG PO TABS
1.0000 mg | ORAL_TABLET | Freq: Four times a day (QID) | ORAL | Status: AC | PRN
Start: 2020-10-23 — End: 2020-10-26

## 2020-10-23 MED ORDER — MAGNESIUM HYDROXIDE 400 MG/5ML PO SUSP: 30.0000 mL | Freq: Every day | ORAL | Status: DC | PRN

## 2020-10-23 MED ORDER — EPINEPHRINE 0.3 MG/0.3ML IJ SOAJ: 0.3000 mg | INTRAMUSCULAR | Status: DC | PRN

## 2020-10-23 MED ORDER — DIVALPROEX SODIUM 500 MG PO DR TAB
500.0000 mg | DELAYED_RELEASE_TABLET | Freq: Every day | ORAL | Status: DC
Start: 2020-10-23 — End: 2020-10-23
  Filled 2020-10-23 (×2): qty 1

## 2020-10-23 MED ORDER — CARIPRAZINE HCL 1.5 MG PO CAPS
1.5000 mg | ORAL_CAPSULE | Freq: Every day | ORAL | Status: DC
Start: 2020-10-23 — End: 2020-10-27
  Administered 2020-10-23 – 2020-10-27 (×5): 1.5 mg via ORAL
  Filled 2020-10-23 (×8): qty 1

## 2020-10-23 MED ORDER — DIVALPROEX SODIUM ER 500 MG PO TB24
500.0000 mg | ORAL_TABLET | Freq: Every day | ORAL | Status: DC
  Administered 2020-10-23 – 2020-10-26 (×4): 500 mg via ORAL
  Filled 2020-10-23 (×7): qty 1

## 2020-10-23 MED ORDER — ONDANSETRON 4 MG PO TBDP: 4.0000 mg | ORAL_TABLET | Freq: Four times a day (QID) | ORAL | Status: AC | PRN

## 2020-10-23 MED ORDER — LOPERAMIDE HCL 2 MG PO CAPS: 2.0000 mg | ORAL_CAPSULE | ORAL | Status: AC | PRN

## 2020-10-23 MED ORDER — LISINOPRIL 10 MG PO TABS
10.0000 mg | ORAL_TABLET | Freq: Every day | ORAL | Status: DC
  Administered 2020-10-23 – 2020-10-27 (×5): 10 mg via ORAL
  Filled 2020-10-23 (×7): qty 1

## 2020-10-23 MED ORDER — MENTHOL 3 MG MT LOZG
1.0000 | LOZENGE | OROMUCOSAL | Status: DC | PRN
  Administered 2020-10-23: 3 mg via ORAL
  Filled 2020-10-23: qty 9

## 2020-10-23 MED ORDER — MUPIROCIN 2 % EX OINT
TOPICAL_OINTMENT | Freq: Two times a day (BID) | CUTANEOUS | Status: DC
  Administered 2020-10-23 – 2020-10-26 (×4): 1 via NASAL
  Filled 2020-10-23: qty 22

## 2020-10-23 MED ORDER — NICOTINE 21 MG/24HR TD PT24
21.0000 mg | MEDICATED_PATCH | Freq: Every day | TRANSDERMAL | Status: DC
  Administered 2020-10-23 – 2020-10-27 (×5): 21 mg via TRANSDERMAL
  Filled 2020-10-23 (×7): qty 1

## 2020-10-23 MED ORDER — ALUM & MAG HYDROXIDE-SIMETH 200-200-20 MG/5ML PO SUSP
30.0000 mL | ORAL | Status: DC | PRN
Start: 2020-10-23 — End: 2020-10-27

## 2020-10-23 MED ORDER — ADULT MULTIVITAMIN W/MINERALS CH
1.0000 | ORAL_TABLET | Freq: Every day | ORAL | Status: DC
  Administered 2020-10-23 – 2020-10-27 (×5): 1 via ORAL
  Filled 2020-10-23 (×7): qty 1

## 2020-10-23 NOTE — Progress Notes (Signed)
Psychoeducational Group Note  Date:  10/23/2020 Time: 2030 Group Topic/Focus:  wrap up group  Participation Level: Did Not Attend  Participation Quality:  Not Applicable  Affect:  Not Applicable  Cognitive:  Not Applicable  Insight:  Not Applicable  Engagement in Group: Not Applicable  Additional Comments:  Did not attend.   Marcille Buffy 10/23/2020, 9:51 PM

## 2020-10-23 NOTE — H&P (Signed)
Psychiatric Admission Assessment Adult  Patient Identification: Ryan Faulkner MRN:  [id number] Date of Evaluation:  10/23/2020 Chief Complaint:  Bipolar 1 disorder, depressed, severe (HCC) [F31.4] Principal Diagnosis: <principal problem not specified> Diagnosis:  Active Problems:   Bipolar 1 disorder, depressed, severe (HCC)  History of Present Illness: Ryan Faulkner is a 40 year old male with a past psychiatric history significant for Bipolar 1 disorder with mania, alcohol dependence, and suicide attempts. This is his first [facility] admission. He has had several inpatient psychiatric admissions in the past. He presented voluntarily to [facility] after a suicide attempt by ingesting 25 200 mg Seroquel tablets. He was admitted to the ICU and intubated. He was discharged from [facility] medical floor and admitted to [facility] for crisis stabilization and medication management.    Patient was seen and evaluated. Case discussed with the treatment team. Patient stated he took #25 200 mg Seroquel tablets in an effort to end his life. He stated his stressors are financial, housing and legal issues. He stated this is his 5th suicide attempt, the last one was in February also by ingestion. He stated he was seeing [name] and a therapist at [facility] and everything was virtual/ He got upset that he could not be seen face to face and told them to "go to hell." He stated that when he is on his medication he does well but when he can't get his medications he spirals out of control and becomes suicidal. His last hospitalization in the [facility] system was in January when he overdosed on Buspar and Seroquel and was inpatient on the medical floor at [facility].  He described his manic symptoms as irritability, anger and lability of mood. He described his depression as sadness, irritability, poor sleep and poor appetite.  Patient has a long standing history of alcohol abuse/dependence. His UDS was positive for THC and his BAL on admission  to [facility] was 351. He denies withdrawal symptoms today. He is on a CIWA protocol. Patient stated his first drink was as a teenager but became a problem when he was in the [employer]. He was honorably discharged from the [employer]. He stated he has been dealing with depression since age 49.  He recently was evicted from his apartment after 9 years due to conflict with a neighbor. He stated he has legal problems. He stated he is living in a week to week motel and even with working he can't save enough for a decent place to live.   His protective factors are his girlfriend and a  relationship with his mother, she lives out of state but they speak frequently. He has a brother he talks to at least once a month. He is actively employed and has a good paying job.   Medically he has hypertension and is on lisinopril and Norvasc. He was positive for MRSA at [facility] and put on Bactroban intranasal, Bactroban is being continued at [facility]. He has a cough form the throat irritation of the tracheal tube. He is a smoker.   His goal for this hospitalization is to get back on his medications and have follow up appointments in place for when he is discharged. He is requesting to see someone face to face for therapy and medication management. He is calm and cooperative, and pleasant on approach. He denies SI/HI/AVH, paranoia and delusions. He is attending group therapy but stated groups don't do him any good. He appears sad and depressed. He is anxious. His BP is slightly  elevated at 126/94, pulse 97 at 1300 today. This may be due to alcohol withdrawal, although he denies withdrawal symptoms, his CIWA score was 4 and he has not required any CIWA protocol medications. Will continue to monitor. Encouragement and support provided.   10/21/20 Labs reviewed: CMP with AST 29 and ALT 78, calcium 8.2, Albumin 3.0, and total protein 5.8, will repeat. CBC with diff with WBC 12.2 will repeat. Ordered Lipid panel, TSH, A1c,  And repeat EKG.    Associated Signs/Symptoms: Depression Symptoms:  depressed mood, fatigue, hopelessness, suicidal thoughts with specific plan, suicidal attempt, anxiety, disturbed sleep, decreased appetite, denies SI today Duration of Depression Symptoms: Greater than two weeks  (Hypo) Manic Symptoms:  Impulsivity, Irritable Mood, Labiality of Mood, Anxiety Symptoms:  Excessive Worry, Psychotic Symptoms:  Hallucinations: None patient denies psychotic symptoms or histroy of PTSD Symptoms: patient denies trauma and abuse Total Time spent with patient: 1 hour  Past Psychiatric History: Bipolar 1 disorder with mania, Alcohol abuse, Suicide attempt  Is the patient at risk to self? Yes.    Has the patient been a risk to self in the past 6 months? Yes.    Has the patient been a risk to self within the distant past? Yes.    Is the patient a risk to others? No.  Has the patient been a risk to others in the past 6 months? No.  Has the patient been a risk to others within the distant past? No.   Prior Inpatient Therapy:  Yes Prior Outpatient Therapy:  Yes  Alcohol Screening: 1. How often do you have a drink containing alcohol?: 4 or more times a week 2. How many drinks containing alcohol do you have on a typical day when you are drinking?: 10 or more 3. How often do you have six or more drinks on one occasion?: Daily or almost daily AUDIT-C Score: 12 4. How often during the last year have you found that you were not able to stop drinking once you had started?: Never 5. How often during the last year have you failed to do what was normally expected from you because of drinking?: Never 6. How often during the last year have you needed a first drink in the morning to get yourself going after a heavy drinking session?: Never 7. How often during the last year have you had a feeling of guilt of remorse after drinking?: Never 8. How often during the last year have you been unable to remember what happened  the night before because you had been drinking?: Less than monthly 9. Have you or someone else been injured as a result of your drinking?: No 10. Has a relative or friend or a doctor or another health worker been concerned about your drinking or suggested you cut down?: No Alcohol Use Disorder Identification Test Final Score (AUDIT): 13 Alcohol Brief Interventions/Follow-up: Patient Refused Substance Abuse History in the last 12 months:  Yes.   Consequences of Substance Abuse: Medical Consequences:  repeat hospitalizations for suicide attempts Legal Consequences:  Multiple upcoming court dates Previous Psychotropic Medications: Yes  Psychological Evaluations: Yes  Past Medical History:  Past Medical History:  Diagnosis Date  . Alcohol dependence (HCC)   . 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 Hypertension   . Suicidal ideation     Past Surgical History:  Procedure Laterality Date  . TONSILLECTOMY     Family History: History reviewed. No pertinent family history. Family Psychiatric  History: Father (deceased) paranoid delusions, Mother depression.  Tobacco Screening: Have you used any form of tobacco in the last 30 days? (Cigarettes, Smokeless Tobacco, Cigars, and/or Pipes): Yes Tobacco use, Select all that apply: 5 or more cigarettes per day Are you interested in Tobacco Cessation Medications?: Yes, will notify MD for an order Counseled patient on smoking cessation including recognizing danger situations, developing coping skills and basic information about quitting provided: Refused/Declined practical counseling Social History:  Social History   Substance and Sexual Activity  Alcohol Use Yes  . Alcohol/week: 24.0 standard drinks  . Types: 24 Cans of beer per week     Social History   Substance and Sexual Activity  Drug Use No    Additional Social History:   Allergies:   Allergies  Allergen Reactions  . Bee Venom Anaphylaxis   Lab Results: No  results found for this or any previous visit (from the past 48 hour(s)).  Blood Alcohol level:  Lab Results  Component Value Date   ETH 351 (HH) 10/19/2020   ETH 96 (H) 07/06/2020    Metabolic Disorder Labs:  No results found for: HGBA1C, MPG No results found for: PROLACTIN Lab Results  Component Value Date   TRIG 178 (H) 10/20/2020    Current Medications: Current Facility-Administered Medications  Medication Dose Route Frequency Provider Last Rate Last Admin  . alum & mag hydroxide-simeth (MAALOX/MYLANTA) 200-200-20 MG/5ML suspension 30 mL  30 mL Oral Q4H PRN Melbourne Abtsaylor, Cody W, PA-C      . amLODipine (NORVASC) tablet 10 mg  10 mg Oral Daily Melbourne Abtsaylor, Cody W, PA-C   10 mg at 10/23/20 [id number]  . busPIRone (BUSPAR) tablet 10 mg  10 mg Oral TID Laveda AbbeParks, Kristi Hyer Britton, NP   10 mg at 10/23/20 1247  . divalproex (DEPAKOTE) DR tablet 500 mg  500 mg Oral QHS Laveda AbbeParks, Gaye Scorza Britton, NP      . EPINEPHrine (EPI-PEN) injection 0.3 mg  0.3 mg Intramuscular PRN Melbourne Abtsaylor, Cody W, PA-C      . hydrOXYzine (ATARAX/VISTARIL) tablet 25 mg  25 mg Oral Q6H PRN Jaclyn Shaggyaylor, Cody W, PA-C   25 mg at 10/23/20 0202  . lisinopril (ZESTRIL) tablet 10 mg  10 mg Oral Daily Jaclyn Shaggyaylor, Cody W, PA-C   10 mg at 10/23/20 [id number]  . loperamide (IMODIUM) capsule 2-4 mg  2-4 mg Oral PRN Jaclyn Shaggyaylor, Cody W, PA-C      . LORazepam (ATIVAN) tablet 1 mg  1 mg Oral Q6H PRN Melbourne Abtsaylor, Cody W, PA-C      . magnesium hydroxide (MILK OF MAGNESIA) suspension 30 mL  30 mL Oral Daily PRN Melbourne Abtsaylor, Cody W, PA-C      . multivitamin with minerals tablet 1 tablet  1 tablet Oral Daily Jaclyn Shaggyaylor, Cody W, PA-C   1 tablet at 10/23/20 [id number]  . nicotine (NICODERM CQ - dosed in mg/24 hours) patch 21 mg  21 mg Transdermal Daily Melbourne Abtsaylor, Cody W, PA-C   21 mg at 10/23/20 [id number]  . ondansetron (ZOFRAN-ODT) disintegrating tablet 4 mg  4 mg Oral Q6H PRN Melbourne Abtsaylor, Cody W, PA-C      . thiamine tablet 100 mg  100 mg Oral Daily Melbourne Abtsaylor, Cody W, PA-C   100 mg at 10/23/20 [id number]  . traZODone  (DESYREL) tablet 50 mg  50 mg Oral QHS PRN Jaclyn Shaggyaylor, Cody W, PA-C   50 mg at 10/23/20 0202   PTA Medications: Medications Prior to Admission  Medication Sig Dispense Refill Last Dose  . acetaminophen (TYLENOL) 325 MG  tablet Take 2 tablets (650 mg total) by mouth every 6 (six) hours as needed for mild pain or moderate pain.     Marland Kitchen amLODipine (NORVASC) 10 MG tablet Take 1 tablet (10 mg total) by mouth daily. 30 tablet 1   . busPIRone (BUSPAR) 10 MG tablet Take 10 mg by mouth 3 (three) times daily.     . cariprazine (VRAYLAR) capsule Take 1 capsule (1.5 mg total) by mouth daily. 30 capsule 1   . divalproex (DEPAKOTE) 250 MG DR tablet Take 1 tablet (250 mg total) by mouth every 12 (twelve) hours. 60 tablet 1   . EPINEPHrine 0.3 mg/0.3 mL IJ SOAJ injection Inject 0.3 mg into the muscle as needed for anaphylaxis.     Marland Kitchen lisinopril (ZESTRIL) 10 MG tablet Take 1 tablet (10 mg total) by mouth daily. 30 tablet 1   . naproxen (NAPROSYN) 500 MG tablet Take 1 tablet (500 mg total) by mouth 2 (two) times daily with a meal. 20 tablet 00   . QUEtiapine (SEROQUEL) 200 MG tablet Take 200 mg by mouth at bedtime.     . thiamine 100 MG tablet Take 100 mg by mouth daily. For alcohol withdrawal     . triamcinolone cream (KENALOG) 0.1 % Apply topically 2 (two) times daily.       Musculoskeletal: Strength & Muscle Tone: within normal limits Gait & Station: normal Patient leans: N/A  Psychiatric Specialty Exam:  Presentation  General Appearance: Appropriate for Environment; Casual  Eye Contact:Good  Speech:Clear and Coherent; Normal Rate  Speech Volume:Normal  Handedness:Right  Mood and Affect  Mood:Anxious; Depressed; Hopeless  Affect:Congruent; Depressed  Thought Process  Thought Processes:Coherent; Goal Directed  Duration of Psychotic Symptoms: No data recorded Past Diagnosis of Schizophrenia or Psychoactive disorder: No  Descriptions of Associations:Intact  Orientation:Full (Time, Place and  Person)  Thought Content:Logical  Hallucinations:Hallucinations: None  Ideas of Reference:None  Suicidal Thoughts:Suicidal Thoughts: No (denies today)  Homicidal Thoughts:Homicidal Thoughts: No  Sensorium  Memory:Immediate Fair; Recent Fair; Remote Fair  Judgment:Fair  Insight:Fair  Executive Functions  Concentration:Good  Attention Span:Good  Recall:Good  Fund of Knowledge:Good  Language:Good  Psychomotor Activity  Psychomotor Activity:Psychomotor Activity: Normal  Assets  Assets:Communication Skills; Financial Resources/Insurance; Desire for Improvement; Resilience; Talents/Skills; Vocational/Educational  Sleep  Sleep:Sleep: Fair  Physical Exam: Physical Exam Constitutional:      Appearance: Normal appearance.  HENT:     Head: Normocephalic.  Pulmonary:     Effort: Pulmonary effort is normal.  Musculoskeletal:        General: Normal range of motion.     Cervical back: Normal range of motion.  Neurological:     General: No focal deficit present.     Mental Status: He is alert and oriented to person, place, and time.  Psychiatric:        Attention and Perception: Attention normal. He does not perceive auditory or visual hallucinations.        Mood and Affect: Mood is anxious and depressed.        Speech: Speech normal.        Behavior: Behavior normal. Behavior is cooperative.        Thought Content: Thought content is not paranoid or delusional. Thought content does not include homicidal or suicidal ideation. Thought content does not include homicidal or suicidal plan.        Cognition and Memory: Cognition normal.    Review of Systems  Constitutional: Negative for fever.  HENT: Negative for congestion, sinus pain  and sore throat.   Respiratory: Negative.  Negative for cough, shortness of breath and wheezing.   Cardiovascular: Negative.  Negative for chest pain.  Gastrointestinal: Negative.   Genitourinary: Negative.   Musculoskeletal: Negative.    Neurological: Negative.    Blood pressure 132/84, pulse 99, temperature 99.5 F (37.5 C), temperature source Oral, resp. rate 18, height 5\' 10"  (1.778 m), weight 95.7 kg, SpO2 99 %. Body mass index is 30.28 kg/m.  Treatment Plan Summary: 1. Admit for crisis management and stabilization, estimated length of stay 3-5 days.    2. Medication management to reduce current symptoms to base line and improve the patient's overall level of functioning: See MAR, Md's SRA & treatment plan.   Observation Level/Precautions:  15 minute checks  Laboratory:  Labs reviewed. Ordered Lipid panel, A1c, TSH, repeat EKG, repeat CMP and CBC with Diff.  Psychotherapy:  Group therapy  Medications:  See MAR  Consultations:  TBD  Discharge Concerns:  Safety, access to medications, suicidal ideation  Estimated LOS:3-5 days  Other:     Physician Treatment Plan for Primary Diagnosis: Bipolar 1 disorder, depressed, severe (HCC) Long Term Goal(s): Improvement in symptoms so as ready for discharge  Short Term Goals: Ability to identify changes in lifestyle to reduce recurrence of condition will improve, Ability to disclose and discuss suicidal ideas, Ability to identify and develop effective coping behaviors will improve, Compliance with prescribed medications will improve and Ability to identify triggers associated with substance abuse/mental health issues will improve  Physician Treatment Plan for Secondary Diagnosis: Principal Problem:   Bipolar 1 disorder, depressed, severe (HCC) Active Problems:   Alcohol use disorder, severe, dependence (HCC)   Suicide attempt (HCC)  Long Term Goal(s): Improvement in symptoms so as ready for discharge  Short Term Goals: Ability to identify changes in lifestyle to reduce recurrence of condition will improve, Ability to verbalize feelings will improve, Ability to disclose and discuss suicidal ideas, Ability to identify and develop effective coping behaviors will improve,  Compliance with prescribed medications will improve and Ability to identify triggers associated with substance abuse/mental health issues will improve  I certify that inpatient services furnished can reasonably be expected to improve the patient's condition.    , NP 4/30/20221:27 PM

## 2020-10-23 NOTE — [facility] Suicide Risk Assessment (Signed)
[facility] Admission Suicide Risk Assessment   Nursing information obtained from:  Patient Demographic factors:  Male,Caucasian,Living alone Current Mental Status:  Suicide attempt prior to admission Loss Factors:  Financial problems / change in socioeconomic status,Legal issues Historical Factors:  Prior suicide attempts Risk Reduction Factors:  Employed  Total Time Spent in Direct Patient Care:  I personally spent 40 minutes on the unit in direct patient care. The direct patient care time included face-to-face time with the patient, reviewing the patient's chart, communicating with other professionals, and coordinating care. Greater than 50% of this time was spent in counseling or coordinating care with the patient regarding goals of hospitalization, psycho-education, and discharge planning needs.  Principal Problem: Bipolar 1 disorder, depressed, severe (HCC) Diagnosis:  Principal Problem:   Bipolar 1 disorder, depressed, severe (HCC) Active Problems:   Alcohol use disorder, severe, dependence (HCC)   Suicide attempt (HCC)  Subjective Data: Patient is a 39y/o male with past psychiatric history of Bipolar I, alcohol use d/o, and previous suicide attempts who was medically admitted to ICU 10/19/20 after arriving in the ED unresponsive and hypotensive after an intentional overdose on 25 Seroquel 200mg  tabs, Kratom, and an OTC herbal product. He was intubated in the ED and extubated on 10/20/20. He was medically cleared and transferred voluntarily to [facility] for continued stabilization. He states he overdosed in the context of multiple psychosocial stressors including legal issues, eviction with homelessness, and financial stress. He reportedly has attempted suicide 4 times in the past and was previously admitted to [facility] in January for an overdose attempt. He states he stopped seeing his psychiatrist and ran out of his medications one month ago. He has been drinking alcohol 2-3 beers up to a case/day for an  unknown length of time prior to admission. He denies psychotic symptoms or paranoia and is unclear when he had his last manic/hypomanic episode. See H&P for additional details.   Continued Clinical Symptoms:  Alcohol Use Disorder Identification Test Final Score (AUDIT): 13 The "Alcohol Use Disorders Identification Test", Guidelines for Use in Primary Care, Second Edition.  World [facility]). Score between 0-7:  no or low risk or alcohol related problems. Score between 8-15:  moderate risk of alcohol related problems. Score between 16-19:  high risk of alcohol related problems. Score 20 or above:  warrants further diagnostic evaluation for alcohol dependence and treatment.  CLINICAL FACTORS:   Bipolar Disorder:   Depressive phase Alcohol/Substance Abuse/Dependencies More than one psychiatric diagnosis Previous Psychiatric Diagnoses and Treatments   Musculoskeletal: Strength & Muscle Tone: within normal limits Gait & Station: normal, steady Patient leans: N/A  Psychiatric Specialty Exam: Physical Exam Vitals reviewed.  HENT:     Head: Normocephalic.  Pulmonary:     Effort: Pulmonary effort is normal.  Neurological:     Mental Status: He is alert.     Review of Systems - see H&P  Blood pressure (!) 126/94, pulse 97, temperature 99.5 F (37.5 C), temperature source Oral, resp. rate 18, height 5\' 10"  (1.778 m), weight 95.7 kg, SpO2 99 %.Body mass index is 30.28 kg/m.  General Appearance: fair hygiene, casually dressed  Eye Contact:  Good  Speech:  Clear and Coherent and Normal Rate  Volume:  Normal  Mood:  Anxious and Depressed  Affect:  Constricted and Tearful  Thought Process:  Goal Directed and Linear  Orientation:  Full (Time, Place, and Person)  Thought Content:  Logical and no acute psychosis or paranoia on exam; no delusions  Suicidal  Thoughts:  Suicide attempt prior to admission; denies current SI, intent or plan  Homicidal Thoughts:  No  Memory:   Recent;   Good  Judgement:  Fair  Insight:  Fair  Psychomotor Activity:  Normal  Concentration:  Concentration: Fair and Attention Span: Fair  Recall:  Fair  Fund of Knowledge:  Good  Language:  Good  Akathisia:  Negative  Assets:  Communication Skills Desire for Improvement Resilience  ADL's:  Intact  Cognition:  WNL  Sleep:       COGNITIVE FEATURES THAT CONTRIBUTE TO RISK:  None    SUICIDE RISK:   Severe:  Frequent, intense, and enduring suicidal ideation, specific plan, no subjective intent, but some objective markers of intent (i.e., choice of lethal method), the method is accessible, some limited preparatory behavior, evidence of impaired self-control, severe dysphoria/symptomatology, multiple risk factors present, and few if any protective factors, particularly a lack of social support.  PLAN OF CARE: Patient is admitted voluntarily to the inpatient unit. We will verify and restart his home BP regimen and place him on CIWA monitoring for potential alcohol withdrawal. He will be restarted on Buspar 10mg  tid, Vraylar 1.5mg  daily and VPA ER 500mg  qhs. I advised that he will need close monitoring of his LFTs after his overdose and in the context of restarting VPA. If his LFTs start trending up his VPA will have to be discontinued. Admission labs reviewed: AST 29 and AST trending down to 78 from 179; Albumin low at 3.0 and total protein low at 5.8; Ca+ 8.2, glucose 106 with remainder of CMP WNL; WBC 12.2, H/H 14.3/43.8 and platelets 154; MRSA positive; triglycerides 178; Mag+ 1.9; phos 4.4; tylenol <10; blood cx no growth; elevated lactic acid 2.1; respiratory panel negative; VPA Level <10; ETOH 351; UDS negative; urine culture no growth; UA shows small blood; PTT 29; PT 13.2 and INR 1.0; Will recheck CBC and CMP and will order TSH, lipid panel and HbgA1c. EKG shows NSR 95bpm with QTc . Continue bactroban nasal treatment for MRSA. Encouraged patient to consider SA treatment after  discharge.   I certify that inpatient services furnished can reasonably be expected to improve the patient's condition.   [id number], MD 10/23/2020, 3:51 PM

## 2020-10-23 NOTE — Progress Notes (Signed)
Patient ID: ISAO SELTZER, male   DOB: [birth year], 40 y.o.   MRN: [id number]  Admission Note:  Pt arrived to the unit without report being called from [facility]. 40 yr male who presents IVC in no acute distress for the treatment of SI and Anxiety. Pt appears flat and depressed. Pt was calm and cooperative with admission process. Pt +ve MRSA and was using mupirocin ointment 2 % at [facility]. Pt stated he was D/C from his last hospital stay and took his medications he was given and when he ran out he started to decompensate. Pt OD on Seroquel , Kratom and ETOH.   Per Assessment: Patient is a 40 year old male presenting voluntarily to [facility] ED via EMS after an intentional overdose on Seroquel. Patient required intubation and admission to ICU. Upon this counselor's exam patient is calm and cooperative. Patient reports he intentionally overdosed on 45 400 mg of Seroquel. Patient reports is diagnosed with Bipolar and was seeing [name], PA in the past but discontinued services with him because everything was virtual and he did not find it helpful. Patient ran out of his Depakote and states that's why he was not thinking clearly, triggering his overdose. He denies SI/HI/AVH at this time. Per chart review patient has a history of multiple admissions of intentional overdose, most recently at [facility] in February. He reports daily alcohol use. He states he drinks anywhere from 3-4 beers on a work day up to a 12 pack when he does not have to work. He denies any other substance use.    A:Skin was assessed and found to be clear of any abnormal marks apart from marks from IV sites. PT searched and no contraband found, POC and unit policies explained and understanding verbalized. Consents obtained. Food and fluids offered, and fluids accepted.   R:Pt had no additional questions or concerns.

## 2020-10-23 NOTE — [facility] Group Notes (Signed)
.  Psychoeducational Group Note  Date: 10/23/2020 Time: 0900-1000    Goal Setting   Purpose of Group: This group helps to provide patients with the steps of setting a goal that is specific, measurable, attainable, realistic and time specific. A discussion on how we keep ourselves stuck with negative self talk.    Participation Level:  Active  Participation Quality:  Appropriate  Affect:  Appropriate  Cognitive:  Appropriate  Insight:  Improving  Engagement in Group:  Engaged  Additional Comments:  Rates his energy as a 7/10.. States the reason he is here is due to overdosing. Affect and mood are appropriate. Vergalized hopelessness  In the group.  Dione Housekeeper

## 2020-10-23 NOTE — Progress Notes (Signed)
[place] NOVEL CORONAVIRUS (COVID-19) DAILY CHECK-OFF SYMPTOMS - answer yes or no to each - every day NO YES  Have you had a fever in the past 24 hours?  . Fever (Temp > 37.80C / 100F) X   Have you had any of these symptoms in the past 24 hours? . New Cough .  Sore Throat  .  Shortness of Breath .  Difficulty Breathing .  Unexplained Body Aches   X   Have you had any one of these symptoms in the past 24 hours not related to allergies?   . Runny Nose .  Nasal Congestion .  Sneezing   X   If you have had runny nose, nasal congestion, sneezing in the past 24 hours, has it worsened?  X   EXPOSURES - check yes or no X   Have you traveled outside the state in the past 14 days?  X   Have you been in contact with someone with a confirmed diagnosis of COVID-19 or PUI in the past 14 days without wearing appropriate PPE?  X   Have you been living in the same home as a person with confirmed diagnosis of COVID-19 or a PUI (household contact)?    X   Have you been diagnosed with COVID-19?    X              What to do next: Answered NO to all: Answered YES to anything:   Proceed with unit schedule Follow the [facility] Inpatient Flowsheet.   

## 2020-10-23 NOTE — [facility] Group Notes (Signed)
Psychoeducational Group Note    Date:10/23/2020 Time: 1300-1400    Life Skills:  A group where two lists are made. What people need and what are things that we do that are healthy. The lists are developed by the patients and it is explained that we often do the actions that are not healthy to get our list of needs met.   Purpose of Group: . The group focus' on teaching patients on how to identify their needs and how to develop the coping skills needed to get their needs met  Participation Level:  Active  Participation Quality:  Appropriate  Affect:  Appropriate  Cognitive:  Oriented  Insight:  Improving  Engagement in Group:  Engaged  Additional Comments:  Pt was quiet throughout the group, however was listening and paying close attention to all that was being said. When asked what he had learned in the group, he was able able to express himself. Affect and nodding of his head however, did say he was paying close attention.  Paulino Rily

## 2020-10-23 NOTE — Tx Team (Signed)
Initial Treatment Plan 10/23/2020 3:31 AM Ryan Faulkner WTU:[id number]    PATIENT STRESSORS: Medication change or noncompliance Substance abuse   PATIENT STRENGTHS: Capable of independent living General fund of knowledge Motivation for treatment/growth   PATIENT IDENTIFIED PROBLEMS: Risk for SI  Anxiety  SA-ETOH  "get my meds right, find good outpatient provider I can see in person"               DISCHARGE CRITERIA:  Improved stabilization in mood, thinking, and/or behavior Verbal commitment to aftercare and medication compliance  PRELIMINARY DISCHARGE PLAN: Attend aftercare/continuing care group Attend PHP/IOP Attend 12-step recovery group Outpatient therapy  PATIENT/FAMILY INVOLVEMENT: This treatment plan has been presented to and reviewed with the patient, Ryan Faulkner.  The patient and family have been given the opportunity to ask questions and make suggestions.  Delos Haring, RN 10/23/2020, 3:31 AM

## 2020-10-23 NOTE — Progress Notes (Signed)
   10/23/20 1400  Psych Admission Type (Psych Patients Only)  Admission Status Involuntary  Psychosocial Assessment  Patient Complaints Anxiety  Eye Contact Fair  Facial Expression Anxious  Affect Anxious  Speech Logical/coherent  Interaction Assertive  Motor Activity Slow  Appearance/Hygiene In scrubs  Behavior Characteristics Anxious;Cooperative  Mood Anxious  Aggressive Behavior  Effect No apparent injury  Thought Process  Coherency Concrete thinking  Content WDL  Delusions WDL  Perception WDL  Hallucination None reported or observed  Judgment Poor  Confusion WDL  Danger to Self  Current suicidal ideation? Denies  Danger to Others  Danger to Others None reported or observed

## 2020-10-23 NOTE — Progress Notes (Signed)
Pt arrived to [facility] no report called

## 2020-10-23 NOTE — Progress Notes (Signed)
Patient being discharged to [facility], accepted to the service of Dr. Jola Babinski, room 401-1. Safe transport [phone]) here to pick up patient. Patient voluntary form signed. Copy put in patient chart and one sent with patient. Patient belongings sent with him. Both IVs removed. Patient alert and orient x4. Patient wheeled down in wheelchair.

## 2020-10-24 LAB — CULTURE, BLOOD (SINGLE)
Culture: NO GROWTH
Special Requests: ADEQUATE

## 2020-10-24 MED ORDER — GABAPENTIN 100 MG PO CAPS
200.0000 mg | ORAL_CAPSULE | Freq: Three times a day (TID) | ORAL | Status: DC
  Administered 2020-10-24 – 2020-10-27 (×9): 200 mg via ORAL
  Filled 2020-10-24 (×15): qty 2

## 2020-10-24 MED ORDER — TRAZODONE HCL 100 MG PO TABS
100.0000 mg | ORAL_TABLET | Freq: Every day | ORAL | Status: DC
  Administered 2020-10-24 – 2020-10-26 (×3): 100 mg via ORAL
  Filled 2020-10-24 (×5): qty 1

## 2020-10-24 NOTE — Progress Notes (Signed)
[place] NOVEL CORONAVIRUS (COVID-19) DAILY CHECK-OFF SYMPTOMS - answer yes or no to each - every day NO YES  Have you had a fever in the past 24 hours?  . Fever (Temp > 37.80C / 100F) X   Have you had any of these symptoms in the past 24 hours? . New Cough .  Sore Throat  .  Shortness of Breath .  Difficulty Breathing .  Unexplained Body Aches   X   Have you had any one of these symptoms in the past 24 hours not related to allergies?   . Runny Nose .  Nasal Congestion .  Sneezing   X   If you have had runny nose, nasal congestion, sneezing in the past 24 hours, has it worsened?  X   EXPOSURES - check yes or no X   Have you traveled outside the state in the past 14 days?  X   Have you been in contact with someone with a confirmed diagnosis of COVID-19 or PUI in the past 14 days without wearing appropriate PPE?  X   Have you been living in the same home as a person with confirmed diagnosis of COVID-19 or a PUI (household contact)?    X   Have you been diagnosed with COVID-19?    X              What to do next: Answered NO to all: Answered YES to anything:   Proceed with unit schedule Follow the [facility] Inpatient Flowsheet.   

## 2020-10-24 NOTE — Progress Notes (Addendum)
   10/24/20 1600  Psych Admission Type (Psych Patients Only)  Admission Status Involuntary  Psychosocial Assessment  Patient Complaints Anxiety  Eye Contact Fair  Facial Expression Flat  Affect Appropriate to circumstance  Speech Logical/coherent  Interaction Assertive  Motor Activity Slow  Appearance/Hygiene In scrubs  Behavior Characteristics Cooperative  Mood Pleasant;Anxious  Aggressive Behavior  Effect No apparent injury  Thought Process  Coherency Concrete thinking  Content WDL  Delusions WDL  Perception WDL  Hallucination None reported or observed  Judgment Poor  Confusion WDL  Danger to Self  Current suicidal ideation? Denies  Danger to Others  Danger to Others None reported or observed  D. Pt presented as mildly irritable this morning, reporting that he slept poorly last night.Pt's mood seemed to improve as the day went on.   Per pt's self inventory, pt rated his depression, hopelessness and anxiety a 3/1/6, respectively.  Pt currently denies SI/HI and AVH and agrees to contact staff before acting on any harmful thoughts.  A. Labs and vitals monitored. Pt given and educated on medications. Pt supported emotionally and encouraged to express concerns and ask questions.   R. Pt remains safe with 15 minute checks. Will continue POC.

## 2020-10-24 NOTE — Progress Notes (Signed)
   10/23/20 2140  COVID-19 Daily Checkoff  Have you had a fever (temp > 37.80C/100F)  in the past 24 hours?  No  If you have had runny nose, nasal congestion, sneezing in the past 24 hours, has it worsened? No  COVID-19 EXPOSURE  Have you traveled outside the state in the past 14 days? No  Have you been in contact with someone with a confirmed diagnosis of COVID-19 or PUI in the past 14 days without wearing appropriate PPE? No  Have you been living in the same home as a person with confirmed diagnosis of COVID-19 or a PUI (household contact)? No  Have you been diagnosed with COVID-19? No    10/23/20 2140  COVID-19 Daily Checkoff  Have you had a fever (temp > 37.80C/100F)  in the past 24 hours?  No  If you have had runny nose, nasal congestion, sneezing in the past 24 hours, has it worsened? No  COVID-19 EXPOSURE  Have you traveled outside the state in the past 14 days? No  Have you been in contact with someone with a confirmed diagnosis of COVID-19 or PUI in the past 14 days without wearing appropriate PPE? No  Have you been living in the same home as a person with confirmed diagnosis of COVID-19 or a PUI (household contact)? No  Have you been diagnosed with COVID-19? No

## 2020-10-24 NOTE — Progress Notes (Signed)
   10/22/20 2130  Psych Admission Type (Psych Patients Only)  Admission Status Other (Comment)  Psychosocial Assessment  Eye Contact Fair  Facial Expression Anxious  Affect Anxious  Speech Logical/coherent  Interaction Assertive  Motor Activity Slow  Appearance/Hygiene In scrubs  Aggressive Behavior  Effect No apparent injury  Thought Process  Coherency Concrete thinking  Content WDL  Delusions WDL  Perception WDL  Hallucination None reported or observed  Judgment Poor  Confusion WDL  Danger to Self  Current suicidal ideation? Denies  Danger to Others  Danger to Others None reported or observed

## 2020-10-24 NOTE — Progress Notes (Signed)
   10/23/20 2130  Psych Admission Type (Psych Patients Only)  Admission Status Involuntary  Psychosocial Assessment  Patient Complaints None  Eye Contact Fair  Facial Expression Flat  Affect Appropriate to circumstance  Speech Logical/coherent  Interaction Assertive  Motor Activity Slow  Appearance/Hygiene In scrubs  Behavior Characteristics Cooperative;Appropriate to situation  Mood Depressed;Pleasant  Aggressive Behavior  Effect No apparent injury  Thought Process  Coherency Concrete thinking  Content WDL  Delusions WDL  Perception WDL  Hallucination None reported or observed  Judgment Poor  Confusion WDL  Danger to Self  Current suicidal ideation? Denies  Danger to Others  Danger to Others None reported or observed

## 2020-10-24 NOTE — Progress Notes (Addendum)
[facility] MD Progress Note  10/24/2020 11:32 AM Ryan Faulkner  MRN:  [id number] Subjective:  "I am irritable because I did not sleep well last night"  Objective: Ryan Faulkner is a 40 year old male with a past psychiatric history significant for Bipolar 1 disorder with mania, alcohol dependence, and suicide attempts. This is his first [facility] admission. He has had several inpatient psychiatric admissions in the past. He presented voluntarily to [facility] after a suicide attempt by ingesting 25 200 mg Seroquel tablets. He was admitted to the ICU and intubated. He was discharged from [facility] floor and admitted to [facility] for crisis stabilization and medication management.   Evaluation on the unit today: Patient was seen and chart reviewed. Patient denies SI/HI/AVH , paranoia and delusions. He denies a plan or intent for suicide. He is taking his medications and has no issues with them. He stated he did not sleep well last night because it was noisy and he did not have his Seroquel. He understands he will not be prescribed Seroquel as he just overdosed on it. His Trazodone was increased to 100 mg at bedtime. He will also receive Gabapentin 200 mg PO TID for alcohol withdrawal syndrome and anxiety. Patient stated he has many legal battles ahead, one for a DUI. He is not interested in alcohol/substance abuse treatment when he is discharged but will accept resources. He stated "I am not trying to make excuses but I have so much going on, I just don't have time to add another stressor to my life." He stated he does not believe in AA because they are religious based. He has poor insight. He is high risk for suicide completion due to the multiple times he has attempted in the past. This attempt being the most severe, which put him intubated in the ICU and his reluctance to seek help for his alcoholism. He stated he has been dealing with alcohol and depression all his life. He is not ready to accept substance abuse help. He appears  older than his 39 years, his face is flushed and he appears miserable. He is calm and cooperative but is focused on discharge. He wants to leave but so far has not signed a 72 hour notice. He has been advised to have his liver enzymes checked after he is discharged, especially since he is drinking and his Depakote was restarted yesterday. Will continue to monitor for safety. Support and encouragement provided.   Principal Problem: Bipolar 1 disorder, depressed, severe (HCC) Diagnosis: Principal Problem:   Bipolar 1 disorder, depressed, severe (HCC) Active Problems:   Alcohol use disorder, severe, dependence (HCC)   Suicide attempt (HCC)  Total Time spent with patient: 25 minutes  Past Psychiatric History: See H&P  Past Medical History:  Past Medical History:  Diagnosis Date  . Alcohol dependence (HCC)   . 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 Hypertension   . Suicidal ideation     Past Surgical History:  Procedure Laterality Date  . TONSILLECTOMY     Family History: History reviewed. No pertinent family history. Family Psychiatric  History: See H&P Social History:  Social History   Substance and Sexual Activity  Alcohol Use Yes  . Alcohol/week: 24.0 standard drinks  . Types: 24 Cans of beer per week     Social History   Substance and Sexual Activity  Drug Use No    Social History   Socioeconomic History  . Marital status: Single  Spouse name: Not on file  . Number of children: Not on file  . Years of education: Not on file  . Highest education level: Not on file  Occupational History  . Not on file  Tobacco Use  . Smoking status: Current Every Day Smoker    Packs/day: 1.50    Years: 25.00    Pack years: 37.50  . Smokeless tobacco: Never Used  Vaping Use  . Vaping Use: Unknown  Substance and Sexual Activity  . Alcohol use: Yes    Alcohol/week: 24.0 standard drinks    Types: 24 Cans of beer per week  . Drug use: No  . Sexual  activity: Yes    Birth control/protection: None  Other Topics Concern  . Not on file  Social History Narrative  . Not on file   Social Determinants of Health   Financial Resource Strain: Not on file  Food Insecurity: Not on file  Transportation Needs: Not on file  Physical Activity: Not on file  Stress: Not on file  Social Connections: Not on file   Additional Social History:     Sleep: Fair  Appetite:  Good  Current Medications: Current Facility-Administered Medications  Medication Dose Route Frequency Provider Last Rate Last Admin  . alum & mag hydroxide-simeth (MAALOX/MYLANTA) 200-200-20 MG/5ML suspension 30 mL  30 mL Oral Q4H PRN Melbourne Abtsaylor, Cody W, PA-C      . amLODipine (NORVASC) tablet 10 mg  10 mg Oral Daily Melbourne Abtsaylor, Cody W, PA-C   10 mg at 10/24/20 0744  . busPIRone (BUSPAR) tablet 10 mg  10 mg Oral TID Laveda AbbeParks, Laurie Britton, NP   10 mg at 10/24/20 0744  . cariprazine (VRAYLAR) capsule 1.5 mg  1.5 mg Oral Daily Laveda AbbeParks, Laurie Britton, NP   1.5 mg at 10/24/20 0744  . divalproex (DEPAKOTE ER) 24 hr tablet 500 mg  500 mg Oral QHS Mason JimSingleton, Elo Marmolejos E, MD   500 mg at 10/23/20 2135  . EPINEPHrine (EPI-PEN) injection 0.3 mg  0.3 mg Intramuscular PRN Melbourne Abtsaylor, Cody W, PA-C      . hydrOXYzine (ATARAX/VISTARIL) tablet 25 mg  25 mg Oral Q6H PRN Jaclyn Shaggyaylor, Cody W, PA-C   25 mg at 10/23/20 2136  . lisinopril (ZESTRIL) tablet 10 mg  10 mg Oral Daily Melbourne Abtsaylor, Cody W, PA-C   10 mg at 10/24/20 0744  . loperamide (IMODIUM) capsule 2-4 mg  2-4 mg Oral PRN Jaclyn Shaggyaylor, Cody W, PA-C      . LORazepam (ATIVAN) tablet 1 mg  1 mg Oral Q6H PRN Melbourne Abtsaylor, Cody W, PA-C      . magnesium hydroxide (MILK OF MAGNESIA) suspension 30 mL  30 mL Oral Daily PRN Jaclyn Shaggyaylor, Cody W, PA-C      . menthol-cetylpyridinium (CEPACOL) lozenge 3 mg  1 lozenge Oral PRN Jaclyn Shaggyaylor, Cody W, PA-C   3 mg at 10/23/20 2204  . multivitamin with minerals tablet 1 tablet  1 tablet Oral Daily Jaclyn Shaggyaylor, Cody W, PA-C   1 tablet at 10/24/20 0744  . mupirocin  ointment (BACTROBAN) 2 %   Nasal BID Laveda AbbeParks, Laurie Britton, NP   1 application at 10/24/20 0746  . nicotine (NICODERM CQ - dosed in mg/24 hours) patch 21 mg  21 mg Transdermal Daily Jaclyn Shaggyaylor, Cody W, PA-C   21 mg at 10/24/20 0747  . ondansetron (ZOFRAN-ODT) disintegrating tablet 4 mg  4 mg Oral Q6H PRN Melbourne Abtsaylor, Cody W, PA-C      . thiamine tablet 100 mg  100 mg Oral Daily Ladona Ridgelaylor,  Glennie Hawk, PA-C   100 mg at 10/24/20 0744  . traZODone (DESYREL) tablet 100 mg  100 mg Oral QHS Laveda Abbe, NP        Lab Results:  Results for orders placed or performed during the hospital encounter of 10/23/20 (from the past 48 hour(s))  Hemoglobin A1c     Status: None   Collection Time: 10/23/20  4:56 PM  Result Value Ref Range   Hgb A1c MFr Bld 5.3 4.8 - 5.6 %    Comment: (NOTE) Pre diabetes:          5.7%-6.4%  Diabetes:              >6.4%  Glycemic control for   <7.0% adults with diabetes    Mean Plasma Glucose 105.41 mg/dL    Comment: Performed at [facility], [address], [place], [place] [address]  Lipid panel     Status: Abnormal   Collection Time: 10/23/20  4:56 PM  Result Value Ref Range   Cholesterol 153 0 - 200 mg/dL   Triglycerides 338 <250 mg/dL   HDL 32 (L) >53 mg/dL   Total CHOL/HDL Ratio 4.8 RATIO   VLDL 29 0 - 40 mg/dL   LDL Cholesterol 92 0 - 99 mg/dL    Comment:        Total Cholesterol/HDL:CHD Risk Coronary Heart Disease Risk Table                     Men   Women  1/2 Average Risk   3.4   3.3  Average Risk       5.0   4.4  2 X Average Risk   9.6   7.1  3 X Average Risk  23.4   11.0        Use the calculated Patient Ratio above and the CHD Risk Table to determine the patient's CHD Risk.        ATP III CLASSIFICATION (LDL):  <100     mg/dL   Optimal  976-734  mg/dL   Near or Above                    Optimal  130-159  mg/dL   Borderline  193-790  mg/dL   High  >240     mg/dL   Very High Performed at [facility], [address],  [place], [place] [address]   TSH     Status: None   Collection Time: 10/23/20  4:56 PM  Result Value Ref Range   TSH 1.521 0.350 - 4.500 uIU/mL    Comment: Performed by a 3rd Generation assay with a functional sensitivity of <=0.01 uIU/mL. Performed at [facility], [address], [place], [place] [address]   Comprehensive metabolic panel     Status: Abnormal   Collection Time: 10/23/20  4:56 PM  Result Value Ref Range   Sodium 136 135 - 145 mmol/L   Potassium 3.9 3.5 - 5.1 mmol/L   Chloride 105 98 - 111 mmol/L   CO2 21 (L) 22 - 32 mmol/L   Glucose, Bld 113 (H) 70 - 99 mg/dL    Comment: Glucose reference range applies only to samples taken after fasting for at least 8 hours.   BUN 11 6 - 20 mg/dL   Creatinine, Ser 2.68 0.61 - 1.24 mg/dL   Calcium 9.3 8.9 - 34.1 mg/dL   Total Protein 7.5 6.5 - 8.1  g/dL   Albumin 3.9 3.5 - 5.0 g/dL   AST 44 (H) 15 - 41 U/L   ALT 78 (H) 0 - 44 U/L   Alkaline Phosphatase 83 38 - 126 U/L   Total Bilirubin 0.6 0.3 - 1.2 mg/dL   GFR, Estimated >00 >93 mL/min    Comment: (NOTE) Calculated using the CKD-EPI Creatinine Equation (2021)    Anion gap 10 5 - 15    Comment: Performed at [facility], [address] [place], [place] [address]  CBC with Differential/Platelet     Status: None   Collection Time: 10/23/20  4:56 PM  Result Value Ref Range   WBC 7.2 4.0 - 10.5 K/uL   RBC 4.91 4.22 - 5.81 MIL/uL   Hemoglobin 15.7 13.0 - 17.0 g/dL   HCT 93.7 16.9 - 67.8 %   MCV 94.5 80.0 - 100.0 fL   MCH 32.0 26.0 - 34.0 pg   MCHC 33.8 30.0 - 36.0 g/dL   RDW 93.8 10.1 - 75.1 %   Platelets 283 150 - 400 K/uL   nRBC 0.0 0.0 - 0.2 %   Neutrophils Relative % 56 %   Neutro Abs 4.1 1.7 - 7.7 K/uL   Lymphocytes Relative 26 %   Lymphs Abs 1.9 0.7 - 4.0 K/uL   Monocytes Relative 13 %   Monocytes Absolute 1.0 0.1 - 1.0 K/uL   Eosinophils Relative 3 %   Eosinophils Absolute 0.2 0.0 - 0.5 K/uL   Basophils Relative 1 %   Basophils  Absolute 0.1 0.0 - 0.1 K/uL   Immature Granulocytes 1 %   Abs Immature Granulocytes 0.04 0.00 - 0.07 K/uL    Comment: Performed at [facility], [address], [place], [place] [address]    Blood Alcohol level:  Lab Results  Component Value Date   ETH [facility]) 10/19/2020   ETH 96 (H) 07/06/2020    Metabolic Disorder Labs: Lab Results  Component Value Date   HGBA1C 5.3 10/23/2020   MPG 105.41 10/23/2020   No results found for: PROLACTIN Lab Results  Component Value Date   CHOL 153 10/23/2020   TRIG 143 10/23/2020   HDL 32 (L) 10/23/2020   CHOLHDL 4.8 10/23/2020   VLDL 29 10/23/2020   LDLCALC 92 10/23/2020    Physical Findings: AIMS: Facial and Oral Movements Muscles of Facial Expression: None, normal Lips and Perioral Area: None, normal Jaw: None, normal Tongue: None, normal,Extremity Movements Upper (arms, wrists, hands, fingers): None, normal Lower (legs, knees, ankles, toes): None, normal, Trunk Movements Neck, shoulders, hips: None, normal, Overall Severity Severity of abnormal movements (highest score from questions above): None, normal Incapacitation due to abnormal movements: None, normal Patient's awareness of abnormal movements (rate only patient's report): No Awareness, Dental Status Current problems with teeth and/or dentures?: Yes Does patient usually wear dentures?: No  CIWA:  CIWA-Ar Total: 2 COWS:  COWS Total Score: 4  Musculoskeletal: Strength & Muscle Tone: within normal limits Gait & Station: normal Patient leans: N/A  Psychiatric Specialty Exam:  Presentation  General Appearance: Appropriate for Environment; Casual  Eye Contact:Good  Speech:Clear and Coherent; Normal Rate  Speech Volume:Normal  Handedness:Right  Mood and Affect  Mood:Anxious; Depressed; Hopeless  Affect:Congruent; Depressed  Thought Process  Thought Processes:Coherent; Goal Directed  Descriptions of Associations:Intact  Orientation:Full  (Time, Place and Person)  Thought Content:Logical  History of Schizophrenia/Schizoaffective disorder:No  Duration of Psychotic Symptoms:No data recorded Hallucinations:Hallucinations: None  Ideas of Reference:None  Suicidal Thoughts:Suicidal Thoughts:  No (denies today)  Homicidal Thoughts:Homicidal Thoughts: No  Sensorium  Memory:Immediate Fair; Recent Fair; Remote Fair  Judgment:Fair  Insight:Fair  Executive Functions  Concentration:Good  Attention Span:Good  Recall:Good  Fund of Knowledge:Good  Language:Good  Psychomotor Activity  Psychomotor Activity:Psychomotor Activity: Normal  Assets  Assets:Communication Skills; Financial Resources/Insurance; Desire for Improvement; Resilience; Talents/Skills; Vocational/Educational  Sleep  Sleep:Sleep: Fair  Physical Exam: Physical Exam Vitals and nursing note reviewed.  Constitutional:      Appearance: Normal appearance.  HENT:     Head: Normocephalic.  Pulmonary:     Effort: Pulmonary effort is normal.  Musculoskeletal:        General: Normal range of motion.     Cervical back: Normal range of motion.  Neurological:     Mental Status: He is alert and oriented to person, place, and time.  Psychiatric:        Attention and Perception: He does not perceive auditory or visual hallucinations.        Mood and Affect: Mood is anxious and depressed.        Speech: Speech normal.        Behavior: Behavior normal. Behavior is cooperative.        Thought Content: Thought content is not paranoid or delusional. Thought content does not include homicidal or suicidal ideation. Thought content does not include homicidal or suicidal plan.        Cognition and Memory: Cognition normal.    Review of Systems  Constitutional: Negative for fever.  HENT: Negative for congestion and sore throat.   Respiratory: Negative for cough, shortness of breath and wheezing.   Cardiovascular: Negative.  Negative for chest pain.   Gastrointestinal: Negative.   Genitourinary: Negative.   Musculoskeletal: Negative.   Neurological: Negative.    Blood pressure (!) 147/98, pulse 90, temperature 98.5 F (36.9 C), temperature source Oral, resp. rate 18, height  (1.778 m), weight 95.7 kg, SpO2 98 %. Body mass index is 30.28 kg/m.   Treatment Plan Summary: Daily contact with patient to assess and evaluate symptoms and progress in treatment and Medication management   Bipolar 1 disorder with depression:  -Continue Vraylar 1.5 mg PO daily -Continue Depakote ER 500 mg PO at bedtime Valproic Acid level, CBC and LFTs on 10/26/2020 or before if necessary for discharge (WBC 7.2, H/H 14.3/43.8 platelets 283 and AST 44 and ALT 78 on 10/23/20)  Elevated LFTs Checking Hepatitis panel (add on to blood in lab) in context of LFT elevation and drug use history  Monitoring LFTs closely with restart of VPA  Anxiety: -Continue Buspar 10 mg PO TID -Continue Vistaril 25 mg PO TID PRN -Start Gabapentin 200 mg PO TID  Insomnia: -Increase trazodone to 100 mg PO at bedtime  Hypertension: Resume home medications -Lisinopril 10 mg PO daily -Norvasc 10 mg PO daily  Alcohol Withdrawal:  -Continue CIWA protocol and medicate with appropriate PRN medications based on protocol scores  -Continue Thiamine 100 mg PO daily -Continue multivitamin PO daily  MRSA: -Continue Bactroban Intranasal BID  15 minute safety checks Encourage participation in the therapeutic milieu Discharge planning in progress - Social work to assist with outpatient appointments. Patient requests a therapist and medication management psychiatrist who will see patients in person, not virtually, if possible.      Laveda Abbe, NP 10/24/2020, 11:32 AM

## 2020-10-25 LAB — HEPATITIS PANEL, ACUTE
HCV Ab: NONREACTIVE
Hep A IgM: NONREACTIVE
Hep B C IgM: NONREACTIVE
Hepatitis B Surface Ag: NONREACTIVE

## 2020-10-25 NOTE — Progress Notes (Signed)
Patient has been observed up in the dayroom watching tv and interacting with select peers. He reports attending groups today but didn't elaborate on what they were about. He was informed of his increased dosage for trazodone for poor sleep the previous night. He reports that his coughing is better as long as he has something to sip on close by. Support given and safety maintained with 15 min checks.

## 2020-10-25 NOTE — [facility] Group Notes (Signed)
Occupational Therapy Group Note Date: 10/25/2020 Group Topic/Focus: Brain Fitness  Group Description: Group encouraged increased social engagement and participation through discussion/activity focused on brain fitness. Patients were provided education on various brain fitness activities/strategies, with explanation provided on the qualifying factors including: one, that is has to be challenging/hard and two, it has to be something that you do not do every day. Patients engaged actively during group session in various brain fitness activities to increase attention, concentration, and problem-solving skills. Discussion followed with a focus on identifying the benefits of brain fitness activities as use for adaptive coping strategies and distraction.    Therapeutic Goal(s): Identify benefit(s) of brain fitness activities as use for adaptive coping and healthy distraction. Identify specific brain fitness activities to engage in as use for adaptive coping and healthy distraction. Participation Level: Active   Participation Quality: Independent   Behavior: Calm and Cooperative   Speech/Thought Process: Focused   Affect/Mood: Euthymic   Insight: Fair   Judgement: Fair   Individualization: Osiris was active in their participation of group discussion/activity. Pt appeared receptive to information/education received on brain fitness and identified benefit of use as coping skill/distraction.   Modes of Intervention: Activity, Discussion, Education and Problem-solving  Patient Response to Interventions:  Attentive, Engaged, Receptive and Interested   Plan: Continue to engage patient in OT groups 2 - 3x/week.  10/25/2020  Donne Hazel, MOT, OTR/L

## 2020-10-25 NOTE — Progress Notes (Signed)
Pt denies SI/HI/AVH.  Pt is frustrated that the dayroom has been closed throughout the day, and pt says this has caused him to become anxious and irritated.  RN validated pt's feelings and concerns.  Pt is interacting with peers and attending groups.  Pt remains safe on the unit with q 15 min checks in place.

## 2020-10-25 NOTE — [facility] Counselor (Signed)
Adult Comprehensive Assessment  Patient ID: Ryan Faulkner, male   DOB: [birth year], 40 y.o.   MRN: [id number]  Information Source:    Current Stressors:  Patient states their primary concerns and needs for treatment are:: Patient states that his primary concerns are to get set up with outpatient follow up appts and get stabilized on meds. Patient states their goals for this hospitilization and ongoing recovery are:: Patient would like to leave and get out of here so that he can take care of tasks.  Patient would like to stabilize on medications. Educational / Learning stressors: none reported Employment / Job issues: Patient reports that he works at [employer] and enjoys his job.  Patient works 3 days a week and feels like being here is keeping him from working and making money to take care of responsibilities in environment. Family Relationships: Patient reports that his family lives out of state.  Patient states that he has a girlfriend but that he supports her. Patient reports that he needs to get out so that he can take her to medical appointments. Financial / Lack of resources (include bankruptcy): Patient does not always feel secure financially. Housing / Lack of housing: Currently homeless, living in hotels with his girlfriend. Physical health (include injuries & life threatening diseases): none reported Social relationships: girlfriend, family in another state.  Patient states, "I have no one but myself" Substance abuse: alcohol use Bereavement / Loss: none reported  Living/Environment/Situation:  Living Arrangements: Spouse/significant other Living conditions (as described by patient or guardian): living in hotel rooms. Who else lives in the home?: girlfriend How long has patient lived in current situation?: since December 2021 What is atmosphere in current home: Temporary  Family History:  Marital status: Single Long term relationship, how long?: n/a What types of issues  is patient dealing with in the relationship?: Patient reports that girlfriend is sick and she is not a support person for his personal needs. Are you sexually active?:  (did not disclose) What is your sexual orientation?: heterosexual Has your sexual activity been affected by drugs, alcohol, medication, or emotional stress?: yes Does patient have children?: No  Childhood History:  By whom was/is the patient raised?: Both parents Additional childhood history information: patient reports childhood was "great" Description of patient's relationship with caregiver when they were a child: "great" Patient's description of current relationship with people who raised him/her: Patient reports that relationship is good, patient has contact with mom once a month How were you disciplined when you got in trouble as a child/adolescent?: nothing out of the ordinary Does patient have siblings?: Yes Number of Siblings: 1 Description of patient's current relationship with siblings: brother- speaks with brother every other month, patient also reports having 3 foster brothers who he no longer speaks with Did patient suffer any verbal/emotional/physical/sexual abuse as a child?: No Did patient suffer from severe childhood neglect?: No Has patient ever been sexually abused/assaulted/raped as an adolescent or adult?: No Was the patient ever a victim of a crime or a disaster?: No Witnessed domestic violence?: No Has patient been affected by domestic violence as an adult?: No  Education:  Highest grade of school patient has completed: Patient went to vocational school Currently a student?: No Learning disability?: No  Employment/Work Situation:   Where is patient currently employed?: [employer] How long has patient been employed?: a year Patient's job has been impacted by current illness: Yes Describe how patient's job has been impacted: Having to take off and not  making money due to mental illness. What  is the longest time patient has a held a job?: year Where was the patient employed at that time?: [employer] Has patient ever been in the [employer]?: No  Financial Resources:   Financial resources: Income from employment Does patient have a representative payee or guardian?: No  Alcohol/Substance Abuse:   What has been your use of drugs/alcohol within the last 12 months?: alcohol If attempted suicide, did drugs/alcohol play a role in this?: Yes Alcohol/Substance Abuse Treatment Hx: Past detox If yes, describe treatment: has attended AA meetings in the past but did not have a good experience,  doesn't believe in a higher power and was humiliated after telling his story at a meeting Has alcohol/substance abuse ever caused legal problems?: Yes (DWI)  Social Support System:   Patient's Community Support System: Poor Describe Community Support System: patient reports none but patient lives with his girlfriend Type of faith/religion: did not report How does patient's faith help to cope with current illness?: likes to accomplish tasks  Leisure/Recreation:   Do You Have Hobbies?: No (patient reports that he doesn't have time)  Strengths/Needs:   What is the patient's perception of their strengths?: a sense of responsibility, resiliency, problem solver Patient states they can use these personal strengths during their treatment to contribute to their recovery: yes Patient states these barriers may affect/interfere with their treatment: alcohol, not being financially secure, homelessness Patient states these barriers may affect their return to the community: alcohol, homelessness, not having drivers license due to DWI Other important information patient would like considered in planning for their treatment: the longer he stays in treatment, the longer he can't take care of responsibilities and more depressed he gets  Discharge Plan:   Currently receiving community mental health services:  No Patient states concerns and preferences for aftercare planning are: patient would like a primary care appointment and med management appointment.  Patient interested in community support groups that are not AA/NA Patient states they will know when they are safe and ready for discharge when: could not indicate Does patient have access to transportation?: No (will need safe transport) Does patient have financial barriers related to discharge medications?: No Patient description of barriers related to discharge medications: none Plan for no access to transportation at discharge: safe transport Will patient be returning to same living situation after discharge?: Yes  Summary/Recommendations:   Summary and Recommendations (to be completed by the evaluator): While here, Ryan Faulkner can benefit from crisis stabilization, medication management, therapeutic milieu, and referrals for services  Ryan Faulkner. 10/25/2020

## 2020-10-25 NOTE — [facility] Group Notes (Signed)
[facility] LCSW Group Therapy  10/25/2020 2:44 PM  Type of Therapy:  Group Therapy  Participation Level:  Active  Participation Quality:  Appropriate and Attentive  Affect:  Appropriate  Cognitive:  Alert and Appropriate  Insight:  Limited  Engagement in Therapy:  Engaged  Modes of Intervention:  Activity, Discussion and Socialization  Summary of Progress/Problems: Patient participated in group setting.  Patient agreed to meet with group outside and interacted well with peers.  Patient has limited insight on the benefits of treatment and feels like he can be more productive without being in an inpatient setting. Patient expressed many psychosocial stressors that continue to contribute to his overall well being and mental health.  Patient would like to leave inpatient setting as soon as possible.  Patient expressed some coping mechanisms such as staying busy and keeping up with responsibilities as a way to improve mental health.    Dynasia Kercheval E Latara Micheli 10/25/2020, 2:44 PM

## 2020-10-25 NOTE — Progress Notes (Addendum)
[facility] MD Progress Note  10/25/2020 4:10 PM Ryan Faulkner  MRN:  [id number] Subjective:  "I am irritable because I did not sleep well last night"  Objective: Ryan Faulkner is a 40 year old male with a past psychiatric history significant for Bipolar 1 disorder with mania, alcohol dependence, and suicide attempts. This is his first [facility] admission. He has had several inpatient psychiatric admissions in the past. He presented voluntarily to [facility] after a suicide attempt by ingesting 25 200 mg Seroquel tablets. He was admitted to the ICU and intubated. He was discharged from [facility] medical floor and admitted to [facility] for crisis stabilization and medication management.   Evaluation on the unit today: Patient was seen and chart reviewed. Patient denies SI/HI/AVH, paranoia and delusions. He denies a plan or intent for suicide. He is taking his medications and has no issues with them. He reported better sleep last night.  His Trazodone was increased to 100 mg at bedtime, Sunday. He believes the gabapentin and vistaril are helping his anxiety.  Patient stated he has many legal battles ahead, one for a DUI. He is not interested in alcohol/substance abuse treatment when he is discharged but will accept resources. He stated "I am not trying to make excuses but I have so much going on, I just don't have time to add another stressor to my life."  He has poor insight. He acknowledges that all of his current stressors are related to his alcoholism but stated "I just can't add the stress of trying to go to substance abuse treatment to what I already have going on." He is high risk for suicide completion due to the multiple times he has attempted in the past. He is calm and cooperative. He denies depression and looks less anxious today. He would like to be discharged tomorrow to attend to his legal issues and to get back to work.  He met with social work today and they are working on follow up appointments for him. He is attending group  therapy and interacting with staff and peers appropriately. Will continue to monitor for safety. Support and encouragement provided.   Principal Problem: Bipolar 1 disorder, depressed, severe ([place]) Diagnosis: Principal Problem:   Bipolar 1 disorder, depressed, severe ([place]) Active Problems:   Alcohol use disorder, severe, dependence ([place])   Suicide attempt ([place])  Total Time spent with patient: 25 minutes  Past Psychiatric History: See H&P  Past Medical History:  Past Medical History:  Diagnosis Date  . Alcohol dependence ([place])   . 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 Hypertension   . Suicidal ideation     Past Surgical History:  Procedure Laterality Date  . TONSILLECTOMY     Family History: History reviewed. No pertinent family history. Family Psychiatric  History: See H&P Social History:  Social History   Substance and Sexual Activity  Alcohol Use Yes  . Alcohol/week: 24.0 standard drinks  . Types: 24 Cans of beer per week     Social History   Substance and Sexual Activity  Drug Use No    Social History   Socioeconomic History  . Marital status: Single    Spouse name: Not on file  . Number of children: Not on file  . Years of education: Not on file  . Highest education level: Not on file  Occupational History  . Not on file  Tobacco Use  . Smoking status: Current Every Day Smoker  Packs/day: 1.50    Years: 25.00    Pack years: 37.50  . Smokeless tobacco: Never Used  Vaping Use  . Vaping Use: Unknown  Substance and Sexual Activity  . Alcohol use: Yes    Alcohol/week: 24.0 standard drinks    Types: 24 Cans of beer per week  . Drug use: No  . Sexual activity: Yes    Birth control/protection: None  Other Topics Concern  . Not on file  Social History Narrative  . Not on file   Social Determinants of Health   Financial Resource Strain: Not on file  Food Insecurity: Not on file  Transportation Needs: Not on file  Physical  Activity: Not on file  Stress: Not on file  Social Connections: Not on file   Additional Social History:     Sleep: Good  Appetite:  Good  Current Medications: Current Facility-Administered Medications  Medication Dose Route Frequency Provider Last Rate Last Admin  . alum & mag hydroxide-simeth (MAALOX/MYLANTA) 200-200-20 MG/5ML suspension 30 mL  30 mL Oral Q4H PRN Margorie John W, PA-C      . amLODipine (NORVASC) tablet 10 mg  10 mg Oral Daily Margorie John W, PA-C   10 mg at 10/25/20 7262  . busPIRone (BUSPAR) tablet 10 mg  10 mg Oral TID Ethelene Hal, NP   10 mg at 10/25/20 1201  . cariprazine (VRAYLAR) capsule 1.5 mg  1.5 mg Oral Daily Ethelene Hal, NP   1.5 mg at 10/25/20 0355  . divalproex (DEPAKOTE ER) 24 hr tablet 500 mg  500 mg Oral QHS Viann Fish E, MD   500 mg at 10/24/20 2119  . EPINEPHrine (EPI-PEN) injection 0.3 mg  0.3 mg Intramuscular PRN Margorie John W, PA-C      . gabapentin (NEURONTIN) capsule 200 mg  200 mg Oral TID Ethelene Hal, NP   200 mg at 10/25/20 1201  . hydrOXYzine (ATARAX/VISTARIL) tablet 25 mg  25 mg Oral Q6H PRN Prescilla Sours, PA-C   25 mg at 10/25/20 0809  . lisinopril (ZESTRIL) tablet 10 mg  10 mg Oral Daily Margorie John W, PA-C   10 mg at 10/25/20 0809  . loperamide (IMODIUM) capsule 2-4 mg  2-4 mg Oral PRN Prescilla Sours, PA-C      . LORazepam (ATIVAN) tablet 1 mg  1 mg Oral Q6H PRN Margorie John W, PA-C      . magnesium hydroxide (MILK OF MAGNESIA) suspension 30 mL  30 mL Oral Daily PRN Prescilla Sours, PA-C      . menthol-cetylpyridinium (CEPACOL) lozenge 3 mg  1 lozenge Oral PRN Prescilla Sours, PA-C   3 mg at 10/23/20 2204  . multivitamin with minerals tablet 1 tablet  1 tablet Oral Daily Prescilla Sours, PA-C   1 tablet at 10/25/20 9741  . mupirocin ointment (BACTROBAN) 2 %   Nasal BID Ethelene Hal, NP   Given at 10/25/20 [phone]  . nicotine (NICODERM CQ - dosed in mg/24 hours) patch 21 mg  21 mg Transdermal Daily  Margorie John W, PA-C   21 mg at 10/25/20 5364  . ondansetron (ZOFRAN-ODT) disintegrating tablet 4 mg  4 mg Oral Q6H PRN Margorie John W, PA-C      . thiamine tablet 100 mg  100 mg Oral Daily Margorie John W, PA-C   100 mg at 10/25/20 6803  . traZODone (DESYREL) tablet 100 mg  100 mg Oral QHS Ethelene Hal, NP   100  mg at 10/24/20 2119    Lab Results:  Results for orders placed or performed during the hospital encounter of 10/23/20 (from the past 48 hour(s))  Hemoglobin A1c     Status: None   Collection Time: 10/23/20  4:56 PM  Result Value Ref Range   Hgb A1c MFr Bld 5.3 4.8 - 5.6 %    Comment: (NOTE) Pre diabetes:          5.7%-6.4%  Diabetes:              >6.4%  Glycemic control for   <7.0% adults with diabetes    Mean Plasma Glucose 105.41 mg/dL    Comment: Performed at [facility], [place] [address], [place], [place] [address]  Lipid panel     Status: Abnormal   Collection Time: 10/23/20  4:56 PM  Result Value Ref Range   Cholesterol 153 0 - 200 mg/dL   Triglycerides 143 <150 mg/dL   HDL 32 (L) >40 mg/dL   Total CHOL/HDL Ratio 4.8 RATIO   VLDL 29 0 - 40 mg/dL   LDL Cholesterol 92 0 - 99 mg/dL    Comment:        Total Cholesterol/HDL:CHD Risk Coronary Heart Disease Risk Table                     Men   Women  1/2 Average Risk   3.4   3.3  Average Risk       5.0   4.4  2 X Average Risk   9.6   7.1  3 X Average Risk  23.4   11.0        Use the calculated Patient Ratio above and the CHD Risk Table to determine the patient's CHD Risk.        ATP III CLASSIFICATION (LDL):  <100     mg/dL   Optimal  100-129  mg/dL   Near or Above                    Optimal  130-159  mg/dL   Borderline  160-189  mg/dL   High  >190     mg/dL   Very High Performed at [place] [address], [place], [place] [address]   TSH     Status: None   Collection Time: 10/23/20  4:56 PM  Result Value Ref Range   TSH 1.521 0.350 - 4.500 uIU/mL    Comment:  Performed by a 3rd Generation assay with a functional sensitivity of <=0.01 uIU/mL. Performed at [facility], [place] [address] [place],  [address]   Comprehensive metabolic panel     Status: Abnormal   Collection Time: 10/23/20  4:56 PM  Result Value Ref Range   Sodium 136 135 - 145 mmol/L   Potassium 3.9 3.5 - 5.1 mmol/L   Chloride 105 98 - 111 mmol/L   CO2 21 (L) 22 - 32 mmol/L   Glucose, Bld 113 (H) 70 - 99 mg/dL    Comment: Glucose reference range applies only to samples taken after fasting for at least 8 hours.   BUN 11 6 - 20 mg/dL   Creatinine, Ser 0.94 0.61 - 1.24 mg/dL   Calcium 9.3 8.9 - 10.3 mg/dL   Total Protein 7.5 6.5 - 8.1 g/dL   Albumin 3.9 3.5 - 5.0 g/dL   AST 44 (H) 15 - 41 U/L   ALT 78 (H) 0 - 44  U/L   Alkaline Phosphatase 83 38 - 126 U/L   Total Bilirubin 0.6 0.3 - 1.2 mg/dL   GFR, Estimated >60 >60 mL/min    Comment: (NOTE) Calculated using the CKD-EPI Creatinine Equation (2021)    Anion gap 10 5 - 15    Comment: Performed at [facility], [place] [address] [place], [place] [address]  CBC with Differential/Platelet     Status: None   Collection Time: 10/23/20  4:56 PM  Result Value Ref Range   WBC 7.2 4.0 - 10.5 K/uL   RBC 4.91 4.22 - 5.81 MIL/uL   Hemoglobin 15.7 13.0 - 17.0 g/dL   HCT 46.4 39.0 - 52.0 %   MCV 94.5 80.0 - 100.0 fL   MCH 32.0 26.0 - 34.0 pg   MCHC 33.8 30.0 - 36.0 g/dL   RDW 12.2 11.5 - 15.5 %   Platelets 283 150 - 400 K/uL   nRBC 0.0 0.0 - 0.2 %   Neutrophils Relative % 56 %   Neutro Abs 4.1 1.7 - 7.7 K/uL   Lymphocytes Relative 26 %   Lymphs Abs 1.9 0.7 - 4.0 K/uL   Monocytes Relative 13 %   Monocytes Absolute 1.0 0.1 - 1.0 K/uL   Eosinophils Relative 3 %   Eosinophils Absolute 0.2 0.0 - 0.5 K/uL   Basophils Relative 1 %   Basophils Absolute 0.1 0.0 - 0.1 K/uL   Immature Granulocytes 1 %   Abs Immature Granulocytes 0.04 0.00 - 0.07 K/uL    Comment: Performed at [facility], [place] [address] [place], [place] [address]  Hepatitis panel, acute     Status: None   Collection Time: 10/23/20  6:58 PM  Result Value Ref Range   Hepatitis B Surface Ag NON REACTIVE NON REACTIVE   HCV Ab NON REACTIVE NON REACTIVE    Comment: (NOTE) Nonreactive HCV antibody screen is consistent with no HCV infections,  unless recent infection is suspected or other evidence exists to indicate HCV infection.     Hep A IgM NON REACTIVE NON REACTIVE   Hep B C IgM NON REACTIVE NON REACTIVE    Comment: Performed at [facility], [place] [address], [place], [place] [address]    Blood Alcohol level:  Lab Results  Component Value Date   ETH 351 ([place]) 10/19/2020   ETH 96 (H) 70/96/2836    Metabolic Disorder Labs: Lab Results  Component Value Date   HGBA1C 5.3 10/23/2020   MPG 105.41 10/23/2020   No results found for: PROLACTIN Lab Results  Component Value Date   CHOL 153 10/23/2020   TRIG 143 10/23/2020   HDL 32 (L) 10/23/2020   CHOLHDL 4.8 10/23/2020   VLDL 29 10/23/2020   LDLCALC 92 10/23/2020    Physical Findings: AIMS: Facial and Oral Movements Muscles of Facial Expression: None, normal Lips and Perioral Area: None, normal Jaw: None, normal Tongue: None, normal,Extremity Movements Upper (arms, wrists, hands, fingers): None, normal Lower (legs, knees, ankles, toes): None, normal, Trunk Movements Neck, shoulders, hips: None, normal, Overall Severity Severity of abnormal movements (highest score from questions above): None, normal Incapacitation due to abnormal movements: None, normal Patient's awareness of abnormal movements (rate only patient's report): No Awareness, Dental Status Current problems with teeth and/or dentures?: Yes Does patient usually wear dentures?: No  CIWA:  CIWA-Ar Total: 2 COWS:  COWS Total Score: 4  Musculoskeletal: Strength & Muscle Tone: within normal limits Gait & Station: normal Patient leans: N/A  Psychiatric Specialty  Exam:  Presentation  General Appearance: Appropriate for Environment; Casual; Fairly Groomed  Eye Contact:Good  Speech:Clear and Coherent; Normal Rate  Speech Volume:Normal  Handedness:Right  Mood and Affect  Mood:Euthymic  Affect:Congruent; Appropriate  Thought Process  Thought Processes:Coherent; Goal Directed  Descriptions of Associations:Intact  Orientation:Full (Time, Place and Person)  Thought Content:Logical  History of Schizophrenia/Schizoaffective disorder:No  Duration of Psychotic Symptoms:No data recorded Hallucinations:No data recorded  Ideas of Reference:None  Suicidal Thoughts:No data recorded  Homicidal Thoughts:No data recorded  Sensorium  Memory:Immediate Fair; Recent Fair; Remote Fair  Judgment:Fair  Insight:Fair  Executive Functions  Concentration:Good  Attention Span:Good  [place]  Language:Good  Psychomotor Activity  Psychomotor Activity:No data recorded  Assets  Assets:Communication Skills; Financial Resources/Insurance; Desire for Improvement; Resilience; Talents/Skills; Vocational/Educational; Transportation  Sleep  Sleep:No data recorded  Physical Exam: Physical Exam Vitals and nursing note reviewed.  Constitutional:      Appearance: Normal appearance.  HENT:     Head: Normocephalic.  Pulmonary:     Effort: Pulmonary effort is normal.  Musculoskeletal:        General: Normal range of motion.     Cervical back: Normal range of motion.  Neurological:     Mental Status: He is alert and oriented to person, place, and time.  Psychiatric:        Attention and Perception: He does not perceive auditory or visual hallucinations.        Mood and Affect: Mood is anxious and depressed.        Speech: Speech normal.        Behavior: Behavior normal. Behavior is cooperative.        Thought Content: Thought content is not paranoid or delusional. Thought content does not include homicidal or  suicidal ideation. Thought content does not include homicidal or suicidal plan.        Cognition and Memory: Cognition normal.    Review of Systems  Constitutional: Negative for fever.  HENT: Negative for congestion and sore throat.   Respiratory: Negative for cough, shortness of breath and wheezing.   Cardiovascular: Negative.  Negative for chest pain.  Gastrointestinal: Negative.   Genitourinary: Negative.   Musculoskeletal: Negative.   Neurological: Negative.    Blood pressure 117/86, pulse (!) 103, temperature 98.9 F (37.2 C), temperature source Oral, resp. rate 18, height 5' 10"  (1.778 m), weight 95.7 kg, SpO2 98 %. Body mass index is 30.28 kg/m.   Treatment Plan Summary: Daily contact with patient to assess and evaluate symptoms and progress in treatment and Medication management   No new labs or medications today  Bipolar 1 disorder with depression:  -Continue Vraylar 1.5 mg PO daily -Continue Depakote ER 500 mg PO at bedtime Valproic Acid level, CBC and LFTs on 10/26/2020, may need to follow up with PCP if discharged prior to Wednesday.  (WBC 7.2, H/H 14.3/43.8 platelets 283 and AST 44 and ALT 78 on 10/23/20)  Elevated LFTs Checking Hepatitis panel (add on to blood in lab) in context of LFT elevation and drug use history  Monitoring LFTs closely with restart of VPA  Anxiety: -Continue Buspar 10 mg PO TID -Continue Vistaril 25 mg PO TID PRN -Continue Gabapentin 200 mg PO TID  Insomnia: -Increase trazodone to 100 mg PO at bedtime  Hypertension: Resume home medications -Lisinopril 10 mg PO daily -Norvasc 10 mg PO daily  Alcohol Withdrawal:  -Continue CIWA protocol and medicate with appropriate PRN medications based on protocol scores  -  Continue Thiamine 100 mg PO daily -Continue multivitamin PO daily  MRSA: -Continue Bactroban Intranasal BID  15 minute safety checks Encourage participation in the therapeutic milieu Discharge planning in progress - Social  work to assist with outpatient appointments. Patient requests a therapist and medication management psychiatrist who will see patients in person, not virtually, if possible.      Ethelene Hal, NP 10/25/2020, 4:10 PM

## 2020-10-25 NOTE — Progress Notes (Signed)
Recreation Therapy Notes  Date:  5.2.22 Time: 0947 Location: 300 [address] Dayroom  Group Topic: Stress Management  Goal Area(s) Addresses:  Patient will identify positive stress management techniques. Patient will identify benefits of using stress management post d/c.  Behavioral Response: Engaged  Intervention: Stress Management  Activity: Meditation.  LRT played meditation that focused on being able to free yourself by not holding on to things you can't change.  Patients were to listen and follow as meditation played to engage in activity.   Education:  Stress Management, Discharge Planning.   Education Outcome: Acknowledges Education  Clinical Observations/Feedback: Pt attended and participated in group activity.   Croy Drumwright, LRT/CTRS         Janalee Grobe A 10/25/2020 10:59 AM 

## 2020-10-25 NOTE — Progress Notes (Signed)
   10/24/20 2015  COVID-19 Daily Checkoff  Have you had a fever (temp > 37.80C/100F)  in the past 24 hours?  No  If you have had runny nose, nasal congestion, sneezing in the past 24 hours, has it worsened? No  COVID-19 EXPOSURE  Have you traveled outside the state in the past 14 days? No  Have you been in contact with someone with a confirmed diagnosis of COVID-19 or PUI in the past 14 days without wearing appropriate PPE? No  Have you been living in the same home as a person with confirmed diagnosis of COVID-19 or a PUI (household contact)? No  Have you been diagnosed with COVID-19? No

## 2020-10-25 NOTE — Progress Notes (Signed)
The patient rated his day as a 6 out of 10 since their were multiple "irritations" to deal with. He did speak with his Child psychotherapist at one point. His goal for tomorrow is to get discharged.

## 2020-10-25 NOTE — Tx Team (Signed)
Interdisciplinary Treatment and Diagnostic Plan Update  10/25/2020 Time of Session: 3;15PM Ryan Faulkner MRN: [id number]  Principal Diagnosis: Bipolar 1 disorder, depressed, severe ([place])  Secondary Diagnoses: Principal Problem:   Bipolar 1 disorder, depressed, severe ([place]) Active Problems:   Alcohol use disorder, severe, dependence ([place])   Suicide attempt ([place])   Current Medications:  Current Facility-Administered Medications  Medication Dose Route Frequency Provider Last Rate Last Admin  . alum & mag hydroxide-simeth (MAALOX/MYLANTA) 200-200-20 MG/5ML suspension 30 mL  30 mL Oral Q4H PRN Margorie John W, PA-C      . amLODipine (NORVASC) tablet 10 mg  10 mg Oral Daily Margorie John W, PA-C   10 mg at 10/25/20 3009  . busPIRone (BUSPAR) tablet 10 mg  10 mg Oral TID Ethelene Hal, NP   10 mg at 10/25/20 1201  . cariprazine (VRAYLAR) capsule 1.5 mg  1.5 mg Oral Daily Ethelene Hal, NP   1.5 mg at 10/25/20 2330  . divalproex (DEPAKOTE ER) 24 hr tablet 500 mg  500 mg Oral QHS Viann Fish E, MD   500 mg at 10/24/20 2119  . EPINEPHrine (EPI-PEN) injection 0.3 mg  0.3 mg Intramuscular PRN Margorie John W, PA-C      . gabapentin (NEURONTIN) capsule 200 mg  200 mg Oral TID Ethelene Hal, NP   200 mg at 10/25/20 1201  . hydrOXYzine (ATARAX/VISTARIL) tablet 25 mg  25 mg Oral Q6H PRN Pres[place]illa Sours, PA-C   25 mg at 10/25/20 0809  . lisinopril (ZESTRIL) tablet 10 mg  10 mg Oral Daily Margorie John W, PA-C   10 mg at 10/25/20 0809  . loperamide (IMODIUM) capsule 2-4 mg  2-4 mg Oral PRN Pres[place]illa Sours, PA-C      . LORazepam (ATIVAN) tablet 1 mg  1 mg Oral Q6H PRN Margorie John W, PA-C      . magnesium hydroxide (MILK OF MAGNESIA) suspension 30 mL  30 mL Oral Daily PRN Pres[place]illa Sours, PA-C      . menthol-cetylpyridinium (CEPACOL) lozenge 3 mg  1 lozenge Oral PRN Prescilla Sours, PA-C   3 mg at 10/23/20 2204  . multivitamin with minerals tablet 1 tablet  1 tablet Oral Daily Prescilla Sours, PA-C   1 tablet at 10/25/20 0762  . mupirocin ointment (BACTROBAN) 2 %   Nasal BID Ethelene Hal, NP   Given at 10/25/20 [phone]  . nicotine (NICODERM CQ - dosed in mg/24 hours) patch 21 mg  21 mg Transdermal Daily Margorie John W, PA-C   21 mg at 10/25/20 3545  . ondansetron (ZOFRAN-ODT) disintegrating tablet 4 mg  4 mg Oral Q6H PRN Margorie John W, PA-C      . thiamine tablet 100 mg  100 mg Oral Daily Margorie John W, PA-C   100 mg at 10/25/20 6256  . traZODone (DESYREL) tablet 100 mg  100 mg Oral QHS Ethelene Hal, NP   100 mg at 10/24/20 2119   PTA Medications: Medications Prior to Admission  Medication Sig Dispense Refill Last Dose  . acetaminophen (TYLENOL) 325 MG tablet Take 2 tablets (650 mg total) by mouth every 6 (six) hours as needed for mild pain or moderate pain.     Marland Kitchen amLODipine (NORVASC) 10 MG tablet Take 1 tablet (10 mg total) by mouth daily. 30 tablet 1   . busPIRone (BUSPAR) 10 MG tablet Take 10 mg by mouth 3 (three) times daily.     . cariprazine (  VRAYLAR) capsule Take 1 capsule (1.5 mg total) by mouth daily. 30 capsule 1   . divalproex (DEPAKOTE) 250 MG DR tablet Take 1 tablet (250 mg total) by mouth every 12 (twelve) hours. 60 tablet 1   . EPINEPHrine 0.3 mg/0.3 mL IJ SOAJ injection Inject 0.3 mg into the muscle as needed for anaphylaxis.     Marland Kitchen lisinopril (ZESTRIL) 10 MG tablet Take 1 tablet (10 mg total) by mouth daily. 30 tablet 1   . naproxen (NAPROSYN) 500 MG tablet Take 1 tablet (500 mg total) by mouth 2 (two) times daily with a meal. 20 tablet 00   . QUEtiapine (SEROQUEL) 200 MG tablet Take 200 mg by mouth at bedtime.     . thiamine 100 MG tablet Take 100 mg by mouth daily. For alcohol withdrawal     . triamcinolone cream (KENALOG) 0.1 % Apply topically 2 (two) times daily.       Patient Stressors: Medication change or noncompliance Substance abuse  Patient Strengths: Capable of independent living General fund of knowledge Motivation for  treatment/growth  Treatment Modalities: Medication Management, Group therapy, Case management,  1 to 1 session with clinician, Psychoeducation, Recreational therapy.   Physician Treatment Plan for Primary Diagnosis: Bipolar 1 disorder, depressed, severe ([place]) Long Term Goal(s): Improvement in symptoms so as ready for discharge Improvement in symptoms so as ready for discharge   Short Term Goals: Ability to identify changes in lifestyle to reduce recurrence of condition will improve Ability to disclose and discuss suicidal ideas Ability to identify and develop effective coping behaviors will improve Compliance with prescribed medications will improve Ability to identify triggers associated with substance abuse/mental health issues will improve Ability to identify changes in lifestyle to reduce recurrence of condition will improve Ability to verbalize feelings will improve Ability to disclose and discuss suicidal ideas Ability to identify and develop effective coping behaviors will improve Compliance with prescribed medications will improve Ability to identify triggers associated with substance abuse/mental health issues will improve  Medication Management: Evaluate patient's response, side effects, and tolerance of medication regimen.  Therapeutic Interventions: 1 to 1 sessions, Unit Group sessions and Medication administration.  Evaluation of Outcomes: Not Met  Physician Treatment Plan for Secondary Diagnosis: Principal Problem:   Bipolar 1 disorder, depressed, severe ([place]) Active Problems:   Alcohol use disorder, severe, dependence ([place])   Suicide attempt ([place])  Long Term Goal(s): Improvement in symptoms so as ready for discharge Improvement in symptoms so as ready for discharge   Short Term Goals: Ability to identify changes in lifestyle to reduce recurrence of condition will improve Ability to disclose and discuss suicidal ideas Ability to identify and develop effective  coping behaviors will improve Compliance with prescribed medications will improve Ability to identify triggers associated with substance abuse/mental health issues will improve Ability to identify changes in lifestyle to reduce recurrence of condition will improve Ability to verbalize feelings will improve Ability to disclose and discuss suicidal ideas Ability to identify and develop effective coping behaviors will improve Compliance with prescribed medications will improve Ability to identify triggers associated with substance abuse/mental health issues will improve     Medication Management: Evaluate patient's response, side effects, and tolerance of medication regimen.  Therapeutic Interventions: 1 to 1 sessions, Unit Group sessions and Medication administration.  Evaluation of Outcomes: Not Met   RN Treatment Plan for Primary Diagnosis: Bipolar 1 disorder, depressed, severe ([place]) Long Term Goal(s): Knowledge of disease and therapeutic regimen to maintain health will improve  Short  Term Goals: Ability to remain free from injury will improve, Ability to participate in decision making will improve, Ability to verbalize feelings will improve, Ability to identify and develop effective coping behaviors will improve and Compliance with prescribed medications will improve  Medication Management: RN will administer medications as ordered by provider, will assess and evaluate patient's response and provide education to patient for prescribed medication. RN will report any adverse and/or side effects to prescribing provider.  Therapeutic Interventions: 1 on 1 counseling sessions, Psychoeducation, Medication administration, Evaluate responses to treatment, Monitor vital signs and CBGs as ordered, Perform/monitor CIWA, COWS, AIMS and Fall Risk screenings as ordered, Perform wound care treatments as ordered.  Evaluation of Outcomes: Not Met   LCSW Treatment Plan for Primary Diagnosis: Bipolar 1  disorder, depressed, severe ([place]) Long Term Goal(s): Safe transition to appropriate next level of care at discharge, Engage patient in therapeutic group addressing interpersonal concerns.  Short Term Goals: Engage patient in aftercare planning with referrals and resources, Increase social support, Increase ability to appropriately verbalize feelings, Increase emotional regulation, Identify triggers associated with mental health/substance abuse issues and Increase skills for wellness and recovery  Therapeutic Interventions: Assess for all discharge needs, 1 to 1 time with Social worker, Explore available resources and support systems, Assess for adequacy in community support network, Educate family and significant other(s) on suicide prevention, Complete Psychosocial Assessment, Interpersonal group therapy.  Evaluation of Outcomes: Not Met   Progress in Treatment: Attending groups: Yes. Participating in groups: Yes. Taking medication as prescribed: Yes. Toleration medication: Yes. Family/Significant other contact made: No, will contact:  pt declined consents Patient understands diagnosis: No. Discussing patient identified problems/goals with staff: Yes. Medical problems stabilized or resolved: Yes. Denies suicidal/homicidal ideation: Yes. Issues/concerns per patient self-inventory: No.   New problem(s) identified: No, Describe:  none  New Short Term/Long Term Goal(s): detox, medication management for mood stabilization; elimination of SI thoughts; development of comprehensive mental wellness/sobriety plan  Patient Goals:  Did not attend  Discharge Plan or Barriers: Patient recently admitted. CSW will continue to follow and assess for appropriate referrals and possible discharge planning.    Reason for Continuation of Hospitalization: Depression Medication stabilization Suicidal ideation Withdrawal symptoms  Estimated Length of Stay: 3-5 days  Attendees: Patient: Did not attend  10/25/2020 3:46 PM  Physician:  10/25/2020 3:46 PM  Nursing:  10/25/2020 3:46 PM  RN Care Manager: 10/25/2020 3:46 PM  Social Worker: Darletta Moll, [place] 10/25/2020 3:46 PM  Recreational Therapist:  10/25/2020 3:46 PM  Other:  10/25/2020 3:46 PM  Other:  10/25/2020 3:46 PM  Other: 10/25/2020 3:46 PM    Scribe for Treatment Team: Vassie Moselle, [place] 10/25/2020 3:46 PM

## 2020-10-26 MED ORDER — HYDROXYZINE HCL 25 MG PO TABS
25.0000 mg | ORAL_TABLET | Freq: Three times a day (TID) | ORAL | Status: DC | PRN
  Administered 2020-10-27: 25 mg via ORAL
  Filled 2020-10-26: qty 1

## 2020-10-26 MED ORDER — ZIPRASIDONE MESYLATE 20 MG IM SOLR: 20.0000 mg | INTRAMUSCULAR | Status: DC | PRN

## 2020-10-26 MED ORDER — LORAZEPAM 1 MG PO TABS: 1.0000 mg | ORAL_TABLET | ORAL | Status: DC | PRN

## 2020-10-26 MED ORDER — OLANZAPINE 10 MG PO TBDP
10.0000 mg | ORAL_TABLET | Freq: Three times a day (TID) | ORAL | Status: DC | PRN
  Administered 2020-10-26: 10 mg via ORAL
  Filled 2020-10-26: qty 1

## 2020-10-26 NOTE — Progress Notes (Addendum)
   10/26/20 2127  Psych Admission Type (Psych Patients Only)  Admission Status Involuntary  Psychosocial Assessment  Patient Complaints Anxiety  Eye Contact Brief  Facial Expression Flat  Affect Appropriate to circumstance;Irritable  Speech Logical/coherent  Interaction Assertive  Motor Activity Slow  Appearance/Hygiene Unremarkable  Behavior Characteristics Cooperative  Mood Anxious;Irritable  Aggressive Behavior  Effect No apparent injury  Thought Process  Coherency Concrete thinking  Content WDL  Delusions None reported or observed  Perception WDL  Hallucination None reported or observed  Judgment Limited  Confusion None  Danger to Self  Current suicidal ideation? Denies  Danger to Others  Danger to Others None reported or observed   Pt denies SI, HI, AVH and pain. Pt irritable but takes meds as prescribed.

## 2020-10-26 NOTE — Progress Notes (Signed)
Adult Psychoeducational Group Note  Date:  10/26/2020 Time:  10:03 AM  Group Topic/Focus:  Goals Group:   The focus of this group is to help patients establish daily goals to achieve during treatment and discuss how the patient can incorporate goal setting into their daily lives to aide in recovery.  Participation Level:  Active  Participation Quality:  Appropriate  Affect:  Appropriate  Cognitive:  Alert  Insight: Appropriate  Engagement in Group:  Engaged  Modes of Intervention:  Discussion  Additional Comments:  Pt attended and participated in goals group today.  Theophile Harvie R Pietro Bonura 10/26/2020, 10:03 AM

## 2020-10-26 NOTE — Progress Notes (Signed)
Recreation Therapy Notes  Animal-Assisted Activity (AAA) Program Checklist/Progress Notes Patient Eligibility Criteria Checklist & Daily Group note for Rec Tx Intervention  Date: 5.3.22 Time: 1430 Location: [address]   AAA/T Program Assumption of Risk Form signed by Engineer, production or Parent Legal Guardian  YES  Patient is free of allergies or severe asthma  YES   Patient reports no fear of animals  YES  Patient reports no history of cruelty to animals YES   Patient understands his/her participation is voluntary  YES   Patient washes hands before animal contact  YES   Patient washes hands after animal contact  YES  Behavioral Response: Engaged  Education: Charity fundraiser, Appropriate Animal Interaction   Education Outcome: Acknowledges understanding/In group clarification offered/Needs additional education.   Clinical Observations/Feedback: Pt attended and participated in group activity.    Caroll Rancher, LRT/CTRS         Caroll Rancher A 10/26/2020 3:41 PM
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
## 2020-10-26 NOTE — Progress Notes (Signed)
Patient was cooperative with treatment on shift, he was visible during the evening on night shift.He interacted well with peers and staff. He was pleasant on approach. Pt denies SI/HI/AVH. He was compliant with medication regime. He had no issues to report on shift thus far.

## 2020-10-26 NOTE — [facility] Group Notes (Signed)
ADULT GRIEF GROUP NOTE:    Spiritual care group on grief and loss facilitated by chaplain Dyanne Carrel, [facility]    Group Goal:    Support / Education around grief and loss   Members engage in facilitated group support and psycho-social education.    Group Description:    Following introductions and group rules, group members engaged in facilitated group dialog and support around topic of loss, with particular support around experiences of loss in their lives. Group Identified types of loss (relationships / self / things) and identified patterns, circumstances, and changes that precipitate losses. Reflected on thoughts / feelings around loss, normalized grief responses, and recognized variety in grief experience.   Group noted Worden's four tasks of grief in discussion.    Group drew on Adlerian / Rogerian, narrative, MI,   Patient Progress:  Pt attended and participated in group.  He actively engaged in group conversation and shared some of the feelings that he associates with grief and loss.  [address] [facility] Pager, [phone]

## 2020-10-26 NOTE — Progress Notes (Signed)
[facility]  10/26/2020 12:14 PM Milda SmartBrandon L Sobotta  MRN:  [id number] Subjective:  "I am irritable because I did not sleep well last night"  Objective: Clyda GreenerBrandon Shutes is a [age] year old male with a past psychiatric history significant for Bipolar 1 disorder with mania, alcohol dependence, and suicide attempts. [facility] He has had several inpatient psychiatric admissions in the past. He presented voluntarily to [facility] after a suicide attempt by ingesting 25 200 mg Seroquel tablets. He was admitted to the ICU and intubated. He was discharged from [facility] floor and [facility] for crisis stabilization and medication management.   Evaluation on the unit today: Patient was seen and chart reviewed. Patient denies SI/HI/AVH, paranoia and delusions. He denies a plan or intent for suicide. He is taking his medications and has no issues with them. He reported better sleep last night. He slept 6.75 hours.  He reported a good appetite. He believes the gabapentin and vistaril have helped his anxiety.  However, per CIWA protocol parameters the Vistaril order has  finished. Will wait and see if patient asks for anymore before resuming. He is on Buspar 10 mg TID and Gabapentin 200 mg TID, which should target his anxiety. He does not appear anxious today. He denies depression. He is sitting in the day room and talking to one of the male patients. He stated his mood is irritable because he is not being discharged today. He is constantly asking staff for ginger ale or Gatorade. His vital signs are stable; BP 123/89, pulse 98. He is taking his antihypertensives. His face is quite red/flushed which he states id due to his BP. Discussed that this is in part due to his alcoholism. Hepatitis panel is negative.  Patient stated he has many legal battles ahead, one for a DUI. He is not interested in alcohol/substance abuse treatment when he is discharged but will accept resources. He stated "I am not  trying to make excuses but I have so much going on, I just don't have time to add another stressor to my life."  He has poor insight. He acknowledges that all of his current stressors are related to his alcoholism but stated "I just can't add the stress of trying to go to substance abuse treatment to what I already have going on." He is high risk for suicide completion due to the multiple times he has attempted in the past. Social work continues to work on follow up appointments with face to face therapy. Will continue to monitor for safety. Support and encouragement provided.   Principal Problem: Bipolar 1 disorder, depressed, severe (HCC) Diagnosis: Principal Problem:   Bipolar 1 disorder, depressed, severe (HCC) Active Problems:   Alcohol use disorder, severe, dependence (HCC)   Suicide attempt (HCC)  Total Time spent with patient: 25 minutes  Past Psychiatric History: See H&P  Past Medical History:  Past Medical History:  Diagnosis Date  . Alcohol dependence (HCC)   . 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 Hypertension   . Suicidal ideation     Past Surgical History:  Procedure Laterality Date  . TONSILLECTOMY     Family History: History reviewed. No pertinent family history. Family Psychiatric  History: See H&P Social History:  Social History   Substance and Sexual Activity  Alcohol Use Yes  . Alcohol/week: 24.0 standard drinks  . Types: 24 Cans of beer per week  Social History   Substance and Sexual Activity  Drug Use No    Social History   Socioeconomic History  . Marital status: Single    Spouse name: Not on file  . Number of children: Not on file  . Years of education: Not on file  . Highest education level: Not on file  Occupational History  . Not on file  Tobacco Use  . Smoking status: Current Every Day Smoker    Packs/day: 1.50    Years: 25.00    Pack years: 37.50  . Smokeless tobacco: Never Used  Vaping Use  . Vaping Use:  Unknown  Substance and Sexual Activity  . Alcohol use: Yes    Alcohol/week: 24.0 standard drinks    Types: 24 Cans of beer per week  . Drug use: No  . Sexual activity: Yes    Birth control/protection: None  Other Topics Concern  . Not on file  Social History Narrative  . Not on file   Social Determinants of Health   Financial Resource Strain: Not on file  Food Insecurity: Not on file  Transportation Needs: Not on file  Physical Activity: Not on file  Stress: Not on file  Social Connections: Not on file   Additional Social History:     Sleep: Good  Appetite:  Good  Current Medications: Current Facility-Administered Medications  Medication Dose Route Frequency Provider Last Rate Last Admin  . alum & mag hydroxide-simeth (MAALOX/MYLANTA) 200-200-20 MG/5ML suspension 30 mL  30 mL Oral Q4H PRN Melbourne Abts W, PA-C      . amLODipine (NORVASC) tablet 10 mg  10 mg Oral Daily Melbourne Abts W, PA-C   10 mg at 10/26/20 5638  . busPIRone (BUSPAR) tablet 10 mg  10 mg Oral TID Laveda Abbe, NP   10 mg at 10/26/20 1144  . cariprazine (VRAYLAR) capsule 1.5 mg  1.5 mg Oral Daily Laveda Abbe, NP   1.5 mg at 10/26/20 7564  . divalproex (DEPAKOTE ER) 24 hr tablet 500 mg  500 mg Oral QHS Bartholomew Crews E, MD   500 mg at 10/25/20 2114  . EPINEPHrine (EPI-PEN) injection 0.3 mg  0.3 mg Intramuscular PRN Melbourne Abts W, PA-C      . gabapentin (NEURONTIN) capsule 200 mg  200 mg Oral TID Laveda Abbe, NP   200 mg at 10/26/20 1144  . lisinopril (ZESTRIL) tablet 10 mg  10 mg Oral Daily Melbourne Abts W, PA-C   10 mg at 10/26/20 3329  . magnesium hydroxide (MILK OF MAGNESIA) suspension 30 mL  30 mL Oral Daily PRN Jaclyn Shaggy, PA-C      . menthol-cetylpyridinium (CEPACOL) lozenge 3 mg  1 lozenge Oral PRN Jaclyn Shaggy, PA-C   3 mg at 10/23/20 2204  . multivitamin with minerals tablet 1 tablet  1 tablet Oral Daily Jaclyn Shaggy, PA-C   1 tablet at 10/26/20 5188  . mupirocin  ointment (BACTROBAN) 2 %   Nasal BID Laveda Abbe, NP   1 application at 10/26/20 [phone]  . nicotine (NICODERM CQ - dosed in mg/24 hours) patch 21 mg  21 mg Transdermal Daily Melbourne Abts W, PA-C   21 mg at 10/26/20 0630  . thiamine tablet 100 mg  100 mg Oral Daily Melbourne Abts W, PA-C   100 mg at 10/26/20 1601  . traZODone (DESYREL) tablet 100 mg  100 mg Oral QHS Laveda Abbe, NP   100 mg at 10/25/20 2114  Lab Results:  No results found for this or any previous visit (from the past 48 hour(s)).  Blood Alcohol level:  Lab Results  Component Value Date   ETH 351 (HH) 10/19/2020   ETH 96 (H) 07/06/2020    Metabolic Disorder Labs: Lab Results  Component Value Date   HGBA1C 5.3 10/23/2020   MPG 105.41 10/23/2020   No results found for: PROLACTIN Lab Results  Component Value Date   CHOL 153 10/23/2020   TRIG 143 10/23/2020   HDL 32 (L) 10/23/2020   CHOLHDL 4.8 10/23/2020   VLDL 29 10/23/2020   LDLCALC 92 10/23/2020    Physical Findings: AIMS: Facial and Oral Movements Muscles of Facial Expression: None, normal Lips and Perioral Area: None, normal Jaw: None, normal Tongue: None, normal,Extremity Movements Upper (arms, wrists, hands, fingers): None, normal Lower (legs, knees, ankles, toes): None, normal, Trunk Movements Neck, shoulders, hips: None, normal, Overall Severity Severity of abnormal movements (highest score from questions above): None, normal Incapacitation due to abnormal movements: None, normal Patient's awareness of abnormal movements (rate only patient's report): No Awareness, Dental Status Current problems with teeth and/or dentures?: Yes (Missing teeth) Does patient usually wear dentures?: No  CIWA:  CIWA-Ar Total: 0 COWS:  COWS Total Score: 4  Musculoskeletal: Strength & Muscle Tone: within normal limits Gait & Station: normal Patient leans: N/A  Psychiatric Specialty Exam:  Presentation  General Appearance: Appropriate for  Environment; Casual; Fairly Groomed  Eye Contact:Good  Speech:Clear and Coherent; Normal Rate  Speech Volume:Normal  Handedness:Right  Mood and Affect  Mood:Euthymic  Affect:Congruent; Appropriate  Thought Process  Thought Processes:Coherent; Goal Directed  Descriptions of Associations:Intact  Orientation:Full (Time, Place and Person)  Thought Content:Logical  History of Schizophrenia/Schizoaffective disorder:No  Duration of Psychotic Symptoms:No data recorded Hallucinations:No data recorded  Ideas of Reference:None  Suicidal Thoughts:No data recorded  Homicidal Thoughts:No data recorded  Sensorium  Memory:Immediate Fair; Recent Fair; Remote Fair  Judgment:Fair  Insight:Fair  Executive Functions  Concentration:Good  Attention Span:Good  Recall:Good  Fund of Knowledge:Good  Language:Good  Psychomotor Activity  Psychomotor Activity:No data recorded  Assets  Assets:Communication Skills; Financial Resources/Insurance; Desire for Improvement; Resilience; Talents/Skills; Vocational/Educational; Transportation  Sleep  Sleep:No data recorded  Physical Exam: Physical Exam Vitals and nursing note reviewed.  Constitutional:      Appearance: Normal appearance.  HENT:     Head: Normocephalic.  Pulmonary:     Effort: Pulmonary effort is normal.  Musculoskeletal:        General: Normal range of motion.     Cervical back: Normal range of motion.  Neurological:     Mental Status: He is alert and oriented to person, place, and time.  Psychiatric:        Attention and Perception: He does not perceive auditory or visual hallucinations.        Mood and Affect: Mood is anxious and depressed.        Speech: Speech normal.        Behavior: Behavior normal. Behavior is cooperative.        Thought Content: Thought content is not paranoid or delusional. Thought content does not include homicidal or suicidal ideation. Thought content does not include homicidal or  suicidal plan.        Cognition and Memory: Cognition normal.    Review of Systems  Constitutional: Negative for fever.  HENT: Negative for congestion and sore throat.   Respiratory: Negative for cough, shortness of breath and wheezing.   Cardiovascular: Negative.  Negative  for chest pain.  Gastrointestinal: Negative.   Genitourinary: Negative.   Musculoskeletal: Negative.   Neurological: Negative.    Blood pressure 123/89, pulse 98, temperature 97.9 F (36.6 C), temperature source Oral, resp. rate 20, height 5\' 10"  (1.778 m), weight 95.7 kg, SpO2 99 %. Body mass index is 30.28 kg/m.   Treatment Plan Summary: Daily contact with patient to assess and evaluate symptoms and progress in treatment and Medication management   No new labs or medications today  Bipolar 1 disorder with depression:  -Continue Vraylar 1.5 mg PO daily -Continue Depakote ER 500 mg PO at bedtime Valproic Acid level, CBC and LFTs on 10/26/2020, may need to follow up with PCP if discharged prior to Wednesday.  (WBC 7.2, H/H 14.3/43.8 platelets 283 and AST 44 and ALT 78 on 10/23/20)  Elevated LFTs Checking Hepatitis panel (add on to blood in lab) in context of LFT elevation and drug use history  Monitoring LFTs closely with restart of VPA  Anxiety: -Continue Buspar 10 mg PO TID - Vistaril 25 mg PO TID PRN per CIWA protocol medication parameters, medication was stopped.  -Continue Gabapentin 200 mg PO TID  Insomnia: -Increase trazodone to 100 mg PO at bedtime  Hypertension: Resume home medications -Lisinopril 10 mg PO daily -Norvasc 10 mg PO daily  Alcohol Withdrawal:  -Continue CIWA protocol and medicate with appropriate PRN medications based on protocol scores  -Continue Thiamine 100 mg PO daily -Continue multivitamin PO daily  MRSA: -Continue Bactroban Intranasal BID  15 minute safety checks Encourage participation in the therapeutic milieu Discharge planning in progress - Social work to assist  with outpatient appointments. Patient requests a therapist and medication management psychiatrist who will see patients in person, not virtually, if possible.      10/25/20, NP 10/26/2020, 12:14 PM

## 2020-10-27 LAB — CBC WITH DIFFERENTIAL/PLATELET
Abs Immature Granulocytes: 0.15 10*3/uL — ABNORMAL HIGH (ref 0.00–0.07)
Basophils Absolute: 0.1 10*3/uL (ref 0.0–0.1)
Basophils Relative: 1 %
Eosinophils Absolute: 0.3 10*3/uL (ref 0.0–0.5)
Eosinophils Relative: 3 %
HCT: 49.4 % (ref 39.0–52.0)
Hemoglobin: 16.2 g/dL (ref 13.0–17.0)
Immature Granulocytes: 2 %
Lymphocytes Relative: 27 %
Lymphs Abs: 2.7 10*3/uL (ref 0.7–4.0)
MCH: 31.3 pg (ref 26.0–34.0)
MCHC: 32.8 g/dL (ref 30.0–36.0)
MCV: 95.6 fL (ref 80.0–100.0)
Monocytes Absolute: 1.3 10*3/uL — ABNORMAL HIGH (ref 0.1–1.0)
Monocytes Relative: 12 %
Neutro Abs: 5.7 10*3/uL (ref 1.7–7.7)
Neutrophils Relative %: 55 %
Platelets: 335 10*3/uL (ref 150–400)
RBC: 5.17 MIL/uL (ref 4.22–5.81)
RDW: 12.3 % (ref 11.5–15.5)
WBC: 10.2 10*3/uL (ref 4.0–10.5)
nRBC: 0 % (ref 0.0–0.2)

## 2020-10-27 LAB — HEPATIC FUNCTION PANEL
ALT: 64 U/L — ABNORMAL HIGH (ref 0–44)
AST: 29 U/L (ref 15–41)
Albumin: 3.8 g/dL (ref 3.5–5.0)
Alkaline Phosphatase: 71 U/L (ref 38–126)
Bilirubin, Direct: <0.1 mg/dL (ref 0.0–0.2)
Total Bilirubin: 0.5 mg/dL (ref 0.3–1.2)
Total Protein: 7 g/dL (ref 6.5–8.1)

## 2020-10-27 LAB — VALPROIC ACID LEVEL: Valproic Acid Lvl: 29 ug/mL — ABNORMAL LOW (ref 50.0–100.0)

## 2020-10-27 MED ORDER — BUSPIRONE HCL 10 MG PO TABS: 10.0000 mg | ORAL_TABLET | Freq: Three times a day (TID) | ORAL | 0 refills | Status: AC

## 2020-10-27 MED ORDER — CARIPRAZINE HCL 1.5 MG PO CAPS: 1.5000 mg | ORAL_CAPSULE | Freq: Every day | ORAL | 0 refills | Status: DC

## 2020-10-27 MED ORDER — GABAPENTIN 100 MG PO CAPS: 200.0000 mg | ORAL_CAPSULE | Freq: Three times a day (TID) | ORAL | 0 refills | Status: AC

## 2020-10-27 MED ORDER — HYDROXYZINE HCL 25 MG PO TABS: 25.0000 mg | ORAL_TABLET | Freq: Three times a day (TID) | ORAL | 0 refills | Status: DC | PRN

## 2020-10-27 MED ORDER — NICOTINE 21 MG/24HR TD PT24: 21.0000 mg | MEDICATED_PATCH | Freq: Every day | TRANSDERMAL | 0 refills | Status: AC

## 2020-10-27 MED ORDER — DIVALPROEX SODIUM ER 500 MG PO TB24: 500.0000 mg | ORAL_TABLET | Freq: Every day | ORAL | 0 refills | Status: AC

## 2020-10-27 MED ORDER — TRAZODONE HCL 100 MG PO TABS: 100.0000 mg | ORAL_TABLET | Freq: Every day | ORAL | 0 refills | Status: DC

## 2020-10-27 MED ORDER — BUSPIRONE HCL 10 MG PO TABS: 10.0000 mg | ORAL_TABLET | Freq: Three times a day (TID) | ORAL | 0 refills | Status: DC

## 2020-10-27 MED ORDER — DIVALPROEX SODIUM ER 500 MG PO TB24: 500.0000 mg | ORAL_TABLET | Freq: Every day | ORAL | 0 refills | Status: DC

## 2020-10-27 MED ORDER — HYDROXYZINE HCL 25 MG PO TABS: 25.0000 mg | ORAL_TABLET | Freq: Three times a day (TID) | ORAL | 0 refills | Status: AC | PRN

## 2020-10-27 MED ORDER — THIAMINE HCL 100 MG PO TABS: 100.0000 mg | ORAL_TABLET | Freq: Every day | ORAL | 0 refills | Status: DC

## 2020-10-27 MED ORDER — TRAZODONE HCL 100 MG PO TABS: 100.0000 mg | ORAL_TABLET | Freq: Every day | ORAL | 0 refills | Status: AC

## 2020-10-27 MED ORDER — GABAPENTIN 100 MG PO CAPS: 200.0000 mg | ORAL_CAPSULE | Freq: Three times a day (TID) | ORAL | 0 refills | Status: DC

## 2020-10-27 MED ORDER — CARIPRAZINE HCL 1.5 MG PO CAPS: 1.5000 mg | ORAL_CAPSULE | Freq: Every day | ORAL | 0 refills | Status: AC

## 2020-10-27 MED ORDER — TRAZODONE HCL 50 MG PO TABS
50.0000 mg | ORAL_TABLET | Freq: Once | ORAL | Status: AC
  Administered 2020-10-27: 50 mg via ORAL
  Filled 2020-10-27 (×2): qty 1

## 2020-10-27 MED ORDER — THIAMINE HCL 100 MG PO TABS: 100.0000 mg | ORAL_TABLET | Freq: Every day | ORAL | 0 refills | Status: AC

## 2020-10-27 NOTE — [facility] Suicide Risk Assessment (Signed)
[facility] INPATIENT:  Family/Significant Other Suicide Prevention Education  Suicide Prevention Education:  Patient Refusal for Family/Significant Other Suicide Prevention Education: The patient Ryan Faulkner has refused to provide written consent for family/significant other to be provided Family/Significant Other Suicide Prevention Education during admission and/or prior to discharge.  Physician notified.  Metro Kung Ladaija Dimino 10/27/2020, 9:48 AM

## 2020-10-27 NOTE — Progress Notes (Signed)
RN met with pt and reviewed pt's discharge instructions.  Pt verbalized understanding of discharge instructions and pt did not have any questions. RN reviewed and provided pt with a copy of SRA, AVS and Transition Record.  RN returned pt's belongings to pt.  Pt denied SI/HI/AVH and voiced no concerns.  Pt was appreciative of the care pt received at [facility].  Patient discharged to the lobby without incident. 

## 2020-10-27 NOTE — Progress Notes (Signed)
  [facility] Adult Case Management Discharge Plan :  Will you be returning to the same living situation after discharge:  Yes,  Hotel with girlfriend  At discharge, do you have transportation home?: Yes,  Safe Transport  Do you have the ability to pay for your medications: Yes,  Insurance   Release of information consent forms completed and in the chart;  Patient's signature needed at discharge.  Patient to Follow up at:  Follow-up Information    [employer] (SOS). Go to.   Why: website and see online options for virtual alternative support groups around alcohol use Contact information: [email or web address]       [facility] Virtual Groups. Go to.   Why: go to website and look into virtual support meetings around alcohol use.  Contact information: https://www.smartrecovery.org/community/calendar.php       [facility] Go on 11/26/2020.   Why: You have an appointment for medication management services on 11/26/20 at 11:00 am.    This appointment will be held in person, per your request. Contact information: [address] [place] [address] [phone]        [facility]. Go on 12/01/2020.   Specialty: Family Medicine Why: You have a hospital follow up appointment for primary services on 12/01/20 at 2:10 pm.   This appointment will be held in person.  Contact information: [address] [place] [facility] [phone]              Next level of care provider has access to [facility] Link:yes  Safety Planning and Suicide Prevention discussed: Yes,  with patient   Have you used any form of tobacco in the last 30 days? (Cigarettes, Smokeless Tobacco, Cigars, and/or Pipes): Yes  Has patient been referred to the Quitline?: Patient refused referral  Patient has been referred for addiction treatment: Yes  Aram Beecham, LCSWA 10/27/2020, 9:47 AM

## 2020-10-27 NOTE — [facility] Suicide Risk Assessment (Signed)
[facility] Discharge Suicide Risk Assessment   Principal Problem: Bipolar 1 disorder, depressed, severe (HCC) Discharge Diagnoses: Principal Problem:   Bipolar 1 disorder, depressed, severe (HCC) Active Problems:   Alcohol use disorder, severe, dependence (HCC)   Suicide attempt (HCC)   Total Time spent with patient: 15 minutes  Musculoskeletal: Strength & Muscle Tone: within normal limits Gait & Station: normal Patient leans: N/A  Psychiatric Specialty Exam: Review of Systems  All other systems reviewed and are negative.   Blood pressure 109/83, pulse (!) 106, temperature 97.7 F (36.5 C), temperature source Oral, resp. rate 20, height 5\' 10"  (1.778 m), weight 95.7 kg, SpO2 100 %.Body mass index is 30.28 kg/m.  General Appearance: Casual  Eye Contact::  Fair  Speech:  Normal Rate409  Volume:  Normal  Mood:  Euthymic  Affect:  Congruent  Thought Process:  Coherent and Descriptions of Associations: Intact  Orientation:  Full (Time, Place, and Person)  Thought Content:  Logical  Suicidal Thoughts:  No  Homicidal Thoughts:  No  Memory:  Immediate;   Fair Recent;   Fair Remote;   Fair  Judgement:  Intact  Insight:  Fair  Psychomotor Activity:  Normal  Concentration:  Fair  Recall:  [phone] of Knowledge:Fair  Language: Good  Akathisia:  Negative  Handed:  Right  AIMS (if indicated):     Assets:  Desire for Improvement Resilience  Sleep:  Number of Hours: 6.5  Cognition: WNL  ADL's:  Intact   Mental Status Per Nursing Assessment::   On Admission:  NA  Demographic Factors:  Age 37 or older, Caucasian and Low socioeconomic status  Loss Factors: NA  Historical Factors: Impulsivity  Risk Reduction Factors:   NA  Continued Clinical Symptoms:  Bipolar Disorder:   Mixed State Alcohol/Substance Abuse/Dependencies  Cognitive Features That Contribute To Risk:  None    Suicide Risk:  Minimal: No identifiable suicidal ideation.  Patients presenting with no risk  factors but with morbid ruminations; may be classified as minimal risk based on the severity of the depressive symptoms   Follow-up Information    [employer] (SOS). Go to.   Why: website and see online options for virtual alternative support groups around alcohol use Contact information: 76       [facility] Virtual Groups. Go to.   Why: go to website and look into virtual support meetings around alcohol use.  Contact information: https://www.smartrecovery.org/community/calendar.php       [facility] Go on 11/26/2020.   Why: You have an appointment for medication management services on 11/26/20 at 11:00 am.    This appointment will be held in person, per your request. Contact information: [address] [place] [phone]        [facility]. Go on 12/01/2020.   Specialty: Family Medicine Why: You have a hospital follow up appointment for primary services on 12/01/20 at 2:10 pm.   This appointment will be held in person.  Contact information: 01/31/21 [facility] [phone]              Plan Of Care/Follow-up recommendations:  Activity:  ad lib  [phone], MD 10/27/2020, 7:43 AM

## 2020-10-27 NOTE — Progress Notes (Signed)
Pt c/o not being able to stay asleep. Provider notified. Received order for additional one-time dose of Trazodone 50 mg.

## 2020-10-27 NOTE — Progress Notes (Signed)
Recreation Therapy Notes  Date: 5.4.22 Time: 0930 Location: [address] Dayroom  Group Topic: Stress Management   Goal Area(s) Addresses:  Patient will actively participate in stress management techniques presented during session.  Patient will successfully identify benefit of practicing stress management post d/c.   Behavioral Response: Engaged, Appropriate  Intervention: Guided exercise with ambient sound and script  Activity :Guided Imagery  LRT provided education, instruction, and demonstration on practice of visualization via guided imagery. Patient was asked to participate in the technique introduced during session.   Education:  Stress Management, Discharge Planning.   Education Outcome: Acknowledges education  Clinical Observations/Feedback: Patient actively engaged in technique introduced, expressed no concerns.    Caroll Rancher, LRT/CTRS         Caroll Rancher A 10/27/2020 11:29 AM

## 2020-10-27 NOTE — Discharge Summary (Signed)
Physician Discharge Summary Note  Patient:  Ryan Faulkner is an 40 y.o., male MRN:  [id number] DOB:  10-Aug-1980 Patient phone:  [phone] (home)  Patient address:   [facility] [place] [address],  Total Time spent with patient: Greater than 30 minutes  Date of Admission:  10/23/2020  Date of Discharge: 10-27-20  Reason for Admission: Suicide attempt by overdose on Seroquel, Kratom etc rendering him unresponsive requiring intubation.  Principal Problem: Bipolar 1 disorder, depressed, severe (HCC)  Discharge Diagnoses: Principal Problem:   Bipolar 1 disorder, depressed, severe (HCC) Active Problems:   Alcohol use disorder, severe, dependence (HCC)   Suicide attempt [name])  Past Psychiatric History: Bipolar 1 disorder, depressed.  Past Medical History:  Past Medical History:  Diagnosis Date  . Alcohol dependence (HCC)   . 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 Hypertension   . Suicidal ideation     Past Surgical History:  Procedure Laterality Date  . TONSILLECTOMY     Family History: History reviewed. No pertinent family history.  Family Psychiatric  History: See H&P  Social History:  Social History   Substance and Sexual Activity  Alcohol Use Yes  . Alcohol/week: 24.0 standard drinks  . Types: 24 Cans of beer per week     Social History   Substance and Sexual Activity  Drug Use No    Social History   Socioeconomic History  . Marital status: Single    Spouse name: Not on file  . Number of children: Not on file  . Years of education: Not on file  . Highest education level: Not on file  Occupational History  . Not on file  Tobacco Use  . Smoking status: Current Every Day Smoker    Packs/day: 1.50    Years: 25.00    Pack years: 37.50  . Smokeless tobacco: Never Used  Vaping Use  . Vaping Use: Unknown  Substance and Sexual Activity  . Alcohol use: Yes    Alcohol/week: 24.0 standard drinks    Types: 24 Cans of beer per week  .  Drug use: No  . Sexual activity: Yes    Birth control/protection: None  Other Topics Concern  . Not on file  Social History Narrative  . Not on file   Social Determinants of Health   Financial Resource Strain: Not on file  Food Insecurity: Not on file  Transportation Needs: Not on file  Physical Activity: Not on file  Stress: Not on file  Social Connections: Not on file   Hospital Course: (Per Md's admission evaluation notes): Patient is a 39y/o male with past psychiatric history of Bipolar I, alcohol use d/o, and previous suicide attempts who was medically admitted to ICU 10/19/20 after arriving in the ED unresponsive and hypotensive after an intentional overdose on 25 Seroquel 200mg  tabs, Kratom, and an OTC herbal product. He was intubated in the ED and extubated on 10/20/20. He was medically cleared and transferred voluntarily to [facility] for continued stabilization. He states he overdosed in the context of multiple psychosocial stressors including legal issues, eviction with homelessness, and financial stress. He reportedly has attempted suicide 4 times in the past and was previously admitted to [facility] in January for an overdose attempt. He states he stopped seeing his psychiatrist and ran out of his medications one month ago. He has been drinking alcohol 2-3 beers up to a case/day for an unknown length of time prior to admission. He denies psychotic symptoms or  paranoia and is unclear when he had his last manic/hypomanic episode. See H&P for additional details.   Prior to this discharge, Ryan Faulkner was seen & evaluated for mental stability. The current laboratory findings were reviewed (stable), nurses notes & vital signs were reviewed as well. There are no current mental health or medical issues that should prevent this discharge at this time. Patient is being discharged to continue mental health care as noted below.  This is one of several psychiatric admission/discharge summaries from the [facility] systems for this 40 year old Caucasian male with hx of chronic mental illness, multiple psychiatric admissions & alcohol use disorder. His BAL on arrival to the ED was 351. He was brought to the [facility] this time around for evaluation & treatment after an overdose attempt on multiple medications, Kratom & possibly alcohol. He was unresponsive, requiring intubation. After medical stabilization at the ICU, he was brought to the [facility] for further psychiatric treatments.  After evaluation of his presenting symptoms, Ryan Faulkner was recommended for mood stabilization treatments. The medication regimen for his presenting symptoms were discussed & with his consent initiated. He received, stabilized & was discharged on the medications as listed below on his discharge medication lists. He was also enrolled & participated in the group counseling sessions being offered & held on this unit. He learned coping skills. He presented on this admission, other chronic medical conditions that required treatment & monitoring. He was resumed/discharged on all his pertinent home medications for those health issues. He did not receive any alcohol detoxification treatment during his [facility] stay because he was hospitalized first at the ICU for acute medical stabilization after his suicide attempt. He tolerated his treatment regimen without any adverse effects or reactions reported.  Algie's symptoms responded well to his treatment regimen warranting this discharge. During the course of his hospitalization, the 15-minute checks were adequate to ensure his safety.  Patient did not display any dangerous, violent or suicidal behavior on the unit. He interacted with patients & staff appropriately. He participated appropriately in the group sessions/therapies. His medications were addressed & adjusted to meet his needs. He was recommended for outpatient follow-up care & medication management upon discharge to assure his continuity of care.  At the  time of discharge, patient is not reporting any acute suicidal/homicidal ideations. He currently denies any new issues or concerns. Education and supportive counseling provided throughout her hospital stay & upon discharge.  Today upon his discharge evaluation with the attending psychiatrist, Leontae presents mentally & medically stable. He denies any other specific concerns. He is sleeping well. His appetite is good. He denies other physical complaints. He denies AH/VH, delusional thoughts or paranoia. He feels that his medications have been helpful & is in agreement to continue his current treatment regimen as recommended. He was able to engage in safety planning including plan to return to [facility] or contact emergency services if he feels unable to maintain his own safety or the safety of others. Pt had no further questions, comments, or concerns. He left [facility] with all personal belongings in no apparent distress. Transportation per the safe transportation services.  Physical Findings: AIMS: Facial and Oral Movements Muscles of Facial Expression: None, normal Lips and Perioral Area: None, normal Jaw: None, normal Tongue: None, normal,Extremity Movements Upper (arms, wrists, hands, fingers): None, normal Lower (legs, knees, ankles, toes): None, normal, Trunk Movements Neck, shoulders, hips: None, normal, Overall Severity Severity of abnormal movements (highest score from questions above): None, normal Incapacitation due to abnormal  movements: None, normal Patient's awareness of abnormal movements (rate only patient's report): No Awareness, Dental Status Current problems with teeth and/or dentures?: Yes Does patient usually wear dentures?: No  CIWA:  CIWA-Ar Total: 0 COWS:  COWS Total Score: 4  Musculoskeletal: Strength & Muscle Tone: within normal limits Gait & Station: normal Patient leans: N/A  Psychiatric Specialty Exam:  Presentation  General Appearance: Appropriate for Environment;  Casual; Fairly Groomed  Eye Contact:Good  Speech:Clear and Coherent; Normal Rate  Speech Volume:Normal  Handedness:Right  Mood and Affect  Mood:Euthymic  Affect:Congruent; Appropriate  Thought Process  Thought Processes:Coherent; Goal Directed  Descriptions of Associations:Intact  Orientation:Full (Time, Place and Person)  Thought Content:Logical  History of Schizophrenia/Schizoaffective disorder:No  Duration of Psychotic Symptoms:No data recorded Hallucinations:No data recorded Ideas of Reference:None  Suicidal Thoughts:No data recorded Homicidal Thoughts:No data recorded  Sensorium  Memory:Immediate Fair; Recent Fair; Remote Fair  Judgment:Fair  Insight:Fair  Executive Functions  Concentration:Good  Attention Span:Good  Recall:Good  Fund of Knowledge:Good  Language:Good  Psychomotor Activity  Psychomotor Activity:No data recorded  Assets  Assets:Communication Skills; Financial Resources/Insurance; Desire for Improvement; Resilience; Talents/Skills; Vocational/Educational; Transportation  Sleep  Sleep:No data recorded  Physical Exam: Physical Exam Vitals and nursing note reviewed.  HENT:     Nose: Nose normal.     Mouth/Throat:     Pharynx: Oropharynx is clear.  Eyes:     Pupils: Pupils are equal, round, and reactive to light.  Cardiovascular:     Rate and Rhythm: Normal rate.     Pulses: Normal pulses.  Pulmonary:     Effort: Pulmonary effort is normal.  Genitourinary:    Comments: Deferred Musculoskeletal:        General: Normal range of motion.     Cervical back: Normal range of motion.  Skin:    General: Skin is warm and dry.  Neurological:     General: No focal deficit present.     Mental Status: He is alert and oriented to person, place, and time. Mental status is at baseline.    Review of Systems  Constitutional: Negative for chills and fever.  HENT: Negative.   Eyes: Negative.   Respiratory: Negative for cough,  shortness of breath and wheezing.   Cardiovascular: Negative for chest pain and palpitations.  Skin: Negative.   Neurological: Negative for dizziness, tingling, tremors, sensory change, speech change, focal weakness, seizures, loss of consciousness, weakness and headaches.  Endo/Heme/Allergies:       Allergies: Bee venom  Psychiatric/Behavioral: Positive for depression (Hx of (stabilized with medication prior to discharge)) and substance abuse (Hx. alcoholism). Negative for hallucinations, memory loss and suicidal ideas. The patient has insomnia (Hx. of stabilized with medication prior to discharge). The patient is not nervous/anxious (Stable upon discharge).    Blood pressure 109/83, pulse (!) 106, temperature 97.7 F (36.5 C), temperature source Oral, resp. rate 20, height 5\' 10"  (1.778 m), weight 95.7 kg, SpO2 100 %. Body mass index is 30.28 kg/m.  Have you used any form of tobacco in the last 30 days? (Cigarettes, Smokeless Tobacco, Cigars, and/or Pipes): Yes  Has this patient used any form of tobacco in the last 30 days? (Cigarettes, Smokeless Tobacco, Cigars, and/or Pipes): Yes, an FDA-approved tobacco cessation medication was offered at discharge.  Blood Alcohol level:  Lab Results  Component Value Date   ETH 351 [facility]) 10/19/2020   ETH 96 (H) 07/06/2020   Metabolic Disorder Labs:  Lab Results  Component Value Date   HGBA1C 5.3 10/23/2020  MPG 105.41 10/23/2020   No results found for: PROLACTIN Lab Results  Component Value Date   CHOL 153 10/23/2020   TRIG 143 10/23/2020   HDL 32 (L) 10/23/2020   CHOLHDL 4.8 10/23/2020   VLDL 29 10/23/2020   LDLCALC 92 10/23/2020   See Psychiatric Specialty Exam and Suicide Risk Assessment completed by Attending Physician prior to discharge.  Discharge destination:  Home  Is patient on multiple antipsychotic therapies at discharge:  No   Has Patient had three or more failed trials of antipsychotic monotherapy by history:   No  Recommended Plan for Multiple Antipsychotic Therapies: NA  Allergies as of 10/27/2020      Reactions   Bee Venom Anaphylaxis      Medication List    STOP taking these medications   acetaminophen 325 MG tablet Commonly known as: TYLENOL   divalproex 250 MG DR tablet Commonly known as: DEPAKOTE Replaced by: divalproex 500 MG 24 hr tablet   naproxen 500 MG tablet Commonly known as: Naprosyn   QUEtiapine 200 MG tablet Commonly known as: SEROQUEL     TAKE these medications     Indication  amLODipine 10 MG tablet Commonly known as: NORVASC Take 1 tablet (10 mg total) by mouth daily.  Indication: High Blood Pressure Disorder   busPIRone 10 MG tablet Commonly known as: BUSPAR Take 1 tablet (10 mg total) by mouth 3 (three) times daily. For anxiety What changed: additional instructions  Indication: Anxiety Disorder   cariprazine capsule Commonly known as: VRAYLAR Take 1 capsule (1.5 mg total) by mouth daily. For mood control What changed: additional instructions  Indication: Depressive Phase of Manic-Depression, Manic Phase of Manic-Depression   divalproex 500 MG 24 hr tablet Commonly known as: DEPAKOTE ER Take 1 tablet (500 mg total) by mouth at bedtime. For mood stabilization Replaces: divalproex 250 MG DR tablet  Indication: Mood stabilization   EPINEPHrine 0.3 mg/0.3 mL Soaj injection Commonly known as: EPI-PEN Inject 0.3 mg into the muscle as needed for anaphylaxis.  Indication: Life-Threatening Hypersensitivity Reaction   gabapentin 100 MG capsule Commonly known as: NEURONTIN Take 2 capsules (200 mg total) by mouth 3 (three) times daily. For alcohol withdrawal syndrome  Indication: Abuse or Misuse of Alcohol, Alcohol Withdrawal Syndrome   hydrOXYzine 25 MG tablet Commonly known as: ATARAX/VISTARIL Take 1 tablet (25 mg total) by mouth 3 (three) times daily as needed for anxiety.  Indication: Feeling Anxious   lisinopril 10 MG tablet Commonly known as:  ZESTRIL Take 1 tablet (10 mg total) by mouth daily.  Indication: High Blood Pressure Disorder   nicotine 21 mg/24hr patch Commonly known as: NICODERM CQ - dosed in mg/24 hours Place 1 patch (21 mg total) onto the skin daily. (May buy from over the counter): For smoking cessation Start taking on: Oct 28, 2020  Indication: Nicotine Addiction   thiamine 100 MG tablet Take 1 tablet (100 mg total) by mouth daily. For thiamine replacement Start taking on: Oct 28, 2020 What changed: additional instructions  Indication: Deficiency of Vitamin B1   traZODone 100 MG tablet Commonly known as: DESYREL Take 1 tablet (100 mg total) by mouth at bedtime. For sleep  Indication: Trouble Sleeping   triamcinolone cream 0.1 % Commonly known as: KENALOG Apply topically 2 (two) times daily.  Indication: Skin Inflammation       Follow-up Information    [employer] Sobriety (SOS). Go to.   Why: website and see online options for virtual alternative support groups around alcohol  use Contact information: [place].es       [employer] Virtual Groups. Go to.   Why: go to website and look into virtual support meetings around alcohol use.  Contact information: https://www.smartrecovery.org/community/calendar.php       [employer] Go on 11/26/2020.   Why: You have an appointment for medication management services on 11/26/20 at 11:00 am.    This appointment will be held in person, per your request. Contact information: [address] [place] [address] [phone]        [facility]. Go on 12/01/2020.   Specialty: Family Medicine Why: You have a hospital follow up appointment for primary services on 12/01/20 at 2:10 pm.   This appointment will be held in person.  Contact information: [address] [facility] [address] [phone]             Follow-up recommendations: Activity:   As tolerated Diet: As recommended by your primary care doctor. Keep all scheduled follow-up appointments as recommended.    Comments: Prescriptions given at discharge.  Patient agreeable to plan.  Given opportunity to ask questions.  Appears to feel comfortable with discharge denies any current suicidal or homicidal thought. Patient is also instructed prior to discharge to: Take all medications as prescribed by his/her mental healthcare provider. Report any adverse effects and or reactions from the medicines to his/her outpatient provider promptly. Patient has been instructed & cautioned: To not engage in alcohol and or illegal drug use while on prescription medicines. In the event of worsening symptoms, patient is instructed to call the crisis hotline, 911 and or go to the nearest ED for appropriate evaluation and treatment of symptoms. To follow-up with his/her primary care provider for your other medical issues, concerns and or health care needs.   Signed: Armandina Stammer, NP, PMHNP, FNP-BC 10/27/2020, 3:25 PM

## 2020-10-27 NOTE — B[facility] Group Notes (Signed)
Adult Psychoeducational Group Note  Date:  10/27/2020 Time:  10:11 AM  Group Topic/Focus:  Goals Group:   The focus of this group is to help patients establish daily goals to achieve during treatment and discuss how the patient can incorporate goal setting into their daily lives to aide in recovery.  Participation Level:  Active  Participation Quality:  Appropriate  Affect:  Appropriate  Cognitive:  Appropriate  Insight: Appropriate  Engagement in Group:  Engaged  Modes of Intervention:  Discussion  Additional Comments:  Patient attended morning goals group and said that his goal for today to follow up with his physician about his discharge plan. He also participated in the self-care discussion.    Caidin Heidenreich W Shaquia Berkley 10/27/2020, 10:11 AM

## 2020-12-01 ENCOUNTER — Inpatient Hospital Stay (INDEPENDENT_AMBULATORY_CARE_PROVIDER_SITE_OTHER): Payer: BC Managed Care – PPO | Admitting: Primary Care

## 2021-09-01 NOTE — H&P (Signed)
Pre-op History and Physical:    Patient Identification   Jeffrey Reese is a(n) 41 y.o. male.  DOB:  [birth year]    HPI:  This patient presents for L5-S1 epidural steroid injection    Patient's medications, allergies, past medical, surgical, social and family histories were reviewed and updated as appropriate.    Review of Systems:  Pertinent items are noted in HPI.     Vitals:    09/01/21 0827   BP: (!) 143/83   Pulse: 86   Resp: 16   Temp: 36 C (96.8 F)       Physical Exam   Cardiovascular: Intact distal pulses.    Pulmonary/Chest: Effort normal.       ASSESSMENT: Jeffrey Reese is a 41 y.o. male who presents for L5-S1 epidural steroid injection    PLAN:  We discussed the planned L5-S1 epidural steroid injection. Discussed the risk of the procedure including increased pain, infection, bleeding, CSF leak, post dural puncture headache, nerve injury, spinal injury and the risks of reaction to anesthetic medications. The patient understands the risks, any and all questions were answered to the patient's satisfaction.  Consent was obtained and the patient would like to proceed with above mentioned procedure.    Date of Surgery Update (To be completed by Attending Surgeon day of surgery.)  There have been no significant clinical changes since the completion of the above H&P    Dr. Bethanne Ginger, MD   Pain Medicine Physician  Date: 09/01/2021  Time: 8:40 AM

## 2022-01-24 ENCOUNTER — Other Ambulatory Visit: Payer: Self-pay

## 2022-01-24 ENCOUNTER — Emergency Department
Admission: EM | Admit: 2022-01-24 | Discharge: 2022-01-24 | Disposition: A | Discharge status: Home / Self Care | Payer: Non-veteran care | Source: Ambulatory Visit | Attending: Emergency Medicine | Admitting: Emergency Medicine

## 2022-01-24 ENCOUNTER — Encounter: Payer: Self-pay | Admitting: Emergency Medicine

## 2022-01-24 ENCOUNTER — Emergency Department: Payer: Non-veteran care

## 2022-01-24 DIAGNOSIS — L03317 Cellulitis of buttock: Secondary | ICD-10-CM | POA: Insufficient documentation

## 2022-01-24 DIAGNOSIS — K429 Umbilical hernia without obstruction or gangrene: Secondary | ICD-10-CM

## 2022-01-24 DIAGNOSIS — R590 Localized enlarged lymph nodes: Secondary | ICD-10-CM

## 2022-01-24 DIAGNOSIS — R600 Localized edema: Secondary | ICD-10-CM

## 2022-01-24 LAB — CBC AND DIFFERENTIAL
Baso # K/uL: 0.1 10*3/uL (ref 0.0–0.1)
Basophil %: 0.6 %
Eos # K/uL: 0.3 10*3/uL (ref 0.0–0.5)
Eosinophil %: 2.2 %
Hematocrit: 44 % (ref 40–51)
Hemoglobin: 15.2 g/dL (ref 13.7–17.5)
IMM Granulocytes #: 0 10*3/uL (ref 0.0–0.0)
IMM Granulocytes: 0.3 %
Lymph # K/uL: 2.8 10*3/uL (ref 1.3–3.6)
Lymphocyte %: 22.4 %
MCH: 31 pg (ref 26–32)
MCHC: 35 g/dL (ref 32–37)
MCV: 91 fL (ref 79–92)
Mono # K/uL: 1.4 10*3/uL — ABNORMAL HIGH (ref 0.3–0.8)
Monocyte %: 10.9 %
Neut # K/uL: 7.9 10*3/uL — ABNORMAL HIGH (ref 1.8–5.4)
Nucl RBC # K/uL: 0 10*3/uL (ref 0.0–0.0)
Nucl RBC %: 0 /100 WBC (ref 0.0–0.2)
Platelets: 285 10*3/uL (ref 150–330)
RBC: 4.9 MIL/uL (ref 4.6–6.1)
RDW: 13.1 % (ref 11.6–14.4)
Seg Neut %: 63.6 %
WBC: 12.5 10*3/uL — ABNORMAL HIGH (ref 4.2–9.1)

## 2022-01-24 LAB — BASIC METABOLIC PANEL
Anion Gap: 11 (ref 7–16)
CO2: 23 mmol/L (ref 20–28)
Calcium: 9.7 mg/dL (ref 9.0–10.3)
Chloride: 100 mmol/L (ref 96–108)
Creatinine: 0.79 mg/dL (ref 0.67–1.17)
Glucose: 103 mg/dL — ABNORMAL HIGH (ref 60–99)
Lab: 12 mg/dL (ref 6–20)
Potassium: 4.6 mmol/L (ref 3.3–5.1)
Sodium: 134 mmol/L (ref 133–145)
eGFR BY CREAT: 115 *

## 2022-01-24 MED ORDER — SULFAMETHOXAZOLE-TRIMETHOPRIM 800-160 MG PO TABS *I*
1.0000 | ORAL_TABLET | Freq: Two times a day (BID) | ORAL | 0 refills | Status: AC
Start: 2022-01-24 — End: 2022-01-31
  Filled 2022-01-24: qty 14, 7d supply, fill #0

## 2022-01-24 MED ORDER — CEPHALEXIN 500 MG PO CAPS *I*
500.0000 mg | ORAL_CAPSULE | Freq: Four times a day (QID) | ORAL | 0 refills | Status: AC
  Filled 2022-01-24: qty 28, 7d supply, fill #0

## 2022-01-24 MED ORDER — ACETAMINOPHEN 500 MG PO TABS *I*
1000.0000 mg | ORAL_TABLET | Freq: Once | ORAL | Status: AC
Start: 2022-01-24 — End: 2022-01-24
  Administered 2022-01-24: 1000 mg via ORAL
  Filled 2022-01-24: qty 2

## 2022-01-24 MED ORDER — IOHEXOL 350 MG/ML (OMNIPAQUE) IV SOLN 500ML BOTTLE *I*
1.0000 mL | Freq: Once | INTRAVENOUS | Status: AC
Start: 2022-01-24 — End: 2022-01-24
  Administered 2022-01-24: 149 mL via INTRAVENOUS

## 2022-01-24 NOTE — ED Triage Notes (Signed)
Pt sent from [place]. Diagnosed with a pilonidal cyst and needs it to be drained. Denies fevers/chills.     Prehospital medications given: No

## 2022-01-24 NOTE — Progress Notes (Signed)
[facility] SOCIAL WORK  PHARMACY FORM     Today's date:  January 24, 2022    Patient Name: Jeffrey Reese      Medical Record #: [id number]   DOB: 04/03/1981  Patient's Address: [address] [place]              Social Worker: Bing Plume, LMSW       Date of Service: January 24, 2022       Funding Source: [employer] Medication Assistance Fund  ___________________________________________________________________    Pharmacy Information:  Date/time sent: January 24, 2022     Time needed: ASAP    Patient Location: Outpatient    Medication Pick-up Preference: Patient will pick up at the pharmacy    Pharmacy Contact: Barbara Cower  Social work signature: Bing Plume, LMSW  Supervisor/Manager Approval (if indicated):  Date:  (supervisor signature not required for Medicaid pending)

## 2022-01-24 NOTE — ED Provider Notes (Addendum)
History     Chief Complaint   Patient presents with   . Abscess     Jeffrey Reese is a 41 y.o. male past medical history of hypertension who presents to the ED today with a concern for pilonidal cyst.  He tells me that he began experiencing pain in his superior buttocks approximately a week ago which he describes as sharp and worsened by placing pressure on the area.  He denies fevers, chills, chest pain or palpitations, shortness of breath, nausea or vomiting, or diarrhea.  He has tried taking both ibuprofen and Tylenol for the pain but found minimal relief with either.  He presented to the [place] diagnosed him with a pilonidal cyst requiring drainage and he was instructed to present to the emergency department.      History provided by:  Patient  Language interpreter used: No          Medical/Surgical/Family History     Past Medical History:   Diagnosis Date   . Alcoholism    . Bipolar disorder    . Hypertension         Patient Active Problem List   Diagnosis Code   . Alcohol dependence with alcohol-induced mood disorder F10.24   . HTN (hypertension) I10   . Salicylate overdose T39.091A   . Suicide attempt T14.91XA   . Male Erectile Disorder F52.8            History reviewed. No pertinent surgical history.  No family history on file.       Social History     Tobacco Use   . Smoking status: Every Day     Packs/day: 0.50     Types: Cigarettes   Substance Use Topics   . Alcohol use: Yes     Comment: >21   . Drug use: No     Living Situation     Questions Responses    Patient lives with Significant Other    Homeless No    Caregiver for other family member     External Services     Employment     Domestic Violence Risk                 Review of Systems   Review of Systems    Physical Exam     Triage Vitals  Triage Start: Start, (01/24/22 1049)  First Recorded BP: 137/84, Resp: 16, Temp: 36.8 C (98.2 F), Temp src: TEMPORAL Oxygen Therapy SpO2: 95 %, Oximetry Source: Rt Hand, O2 Device: None (Room air), Heart Rate:  106, (01/24/22 1050)  .  First Pain Reported  0-10 Scale: 8, (01/24/22 1050)       Physical Exam  Vitals and nursing note reviewed.   Constitutional:       General: He is not in acute distress.     Appearance: Normal appearance. He is not ill-appearing.   HENT:      Head: Normocephalic and atraumatic.      Right Ear: External ear normal.      Left Ear: External ear normal.      Nose: Nose normal.      Mouth/Throat:      Mouth: Mucous membranes are moist.      Pharynx: Oropharynx is clear.   Eyes:      Conjunctiva/sclera: Conjunctivae normal.   Cardiovascular:      Rate and Rhythm: Normal rate and regular rhythm.      Pulses: Normal pulses.  Heart sounds: Normal heart sounds.   Pulmonary:      Effort: Pulmonary effort is normal.      Breath sounds: Normal breath sounds.   Abdominal:      General: Abdomen is flat.   Skin:     General: Skin is warm and dry.             Comments: No pilonidal cyst noted on exam   Neurological:      General: No focal deficit present.      Mental Status: He is alert and oriented to person, place, and time.   Psychiatric:         Mood and Affect: Mood normal.         Behavior: Behavior normal.         Medical Decision Making   Patient seen by me on:  01/24/2022    Assessment:  Adriyel Bahner is a 41 y.o. male past medical history of hypertension who presents to the ED today with a concern for pilonidal cyst. On exam I was unable to appreciate the pilonidal cyst and believe that he is instead suffering from a potential perianal abscess. We will proceed with a CT abdomen and pelvis w/contrast to further investigate this. Patient is otherwise stable and not in acute distress.    Differential diagnosis:  Perianal abscess, cellulitis    Plan:  Labs: CBC, BMP    Imaging: CT abdomen and pelvis w/contrast    Therapeutic: Tylenol    ED Course and Disposition:  WBC was elevated at 12.5 and I was unable to appreciate an abscess on his CT, awaiting formal read.    CT non-concerning for abscess, may  proceed with d/c with a prescription for oral antibiotics.         ED Course as of 01/24/22 1455   Tue Jan 24, 2022   1207 CBC and differential(!)  WBC elevated at 12.5   1438 CT abdomen and pelvis with contrast  Unable to appreciate abscess on my read, awaiting formal read from radiology.       Ernst Bowler, MD      Resident Attestation:    Patient seen by me on 01/24/2022.    I saw and evaluated the patient. I agree with the resident's/fellow's findings and plan of care as documented above.    Author:  Robb Matar, MD          Ernst Bowler, MD  Resident  01/24/22 1457       Ernst Bowler, MD  Resident  01/24/22 1543       Beamon-Scott, Lyn Hollingshead, MD  01/26/22 [phone]

## 2022-01-24 NOTE — Discharge Instructions (Addendum)
You were seen and evaluated today for cellulitis of your left superior buttock. You will be discharged with two different antibiotics to take as prescribed for 7 days. Please take them as prescribed and return to the ED if you start feeling feverish or acutely ill.

## 2022-01-24 NOTE — ED Notes (Signed)
Pt given d/c instructions. Pt agreed to d/c instructions. Pt able to ambulate upon d/c with no acute issues

## 2022-03-06 LAB — CT ABDOMEN AND PELVIS WITH IV CONTRAST

## 2022-04-22 ENCOUNTER — Emergency Department
Admission: EM | Admit: 2022-04-22 | Discharge: 2022-04-22 | Disposition: A | Discharge status: Home / Self Care | Payer: Non-veteran care | Source: Ambulatory Visit | Attending: Emergency Medicine | Admitting: Emergency Medicine

## 2022-04-22 ENCOUNTER — Other Ambulatory Visit: Payer: Self-pay

## 2022-04-22 DIAGNOSIS — L0291 Cutaneous abscess, unspecified: Secondary | ICD-10-CM

## 2022-04-22 DIAGNOSIS — M7989 Other specified soft tissue disorders: Secondary | ICD-10-CM

## 2022-04-22 DIAGNOSIS — L02415 Cutaneous abscess of right lower limb: Secondary | ICD-10-CM | POA: Insufficient documentation

## 2022-04-22 DIAGNOSIS — L03115 Cellulitis of right lower limb: Secondary | ICD-10-CM | POA: Insufficient documentation

## 2022-04-22 DIAGNOSIS — F17210 Nicotine dependence, cigarettes, uncomplicated: Secondary | ICD-10-CM | POA: Insufficient documentation

## 2022-04-22 DIAGNOSIS — R21 Rash and other nonspecific skin eruption: Secondary | ICD-10-CM

## 2022-04-22 MED ORDER — SULFAMETHOXAZOLE-TRIMETHOPRIM 800-160 MG PO TABS *I*
1.0000 | ORAL_TABLET | Freq: Two times a day (BID) | ORAL | 0 refills | Status: DC
Start: 2022-04-22 — End: 2022-04-24

## 2022-04-22 MED ORDER — MORPHINE SULFATE 15 MG PO TABS *I*
15.0000 mg | ORAL_TABLET | ORAL | 0 refills | Status: DC | PRN
Start: 2022-04-22 — End: 2022-07-03

## 2022-04-22 MED ORDER — CEPHALEXIN 250 MG PO CAPS *I*
500.0000 mg | ORAL_CAPSULE | Freq: Once | ORAL | Status: AC
  Administered 2022-04-22: 500 mg via ORAL
  Filled 2022-04-22: qty 2

## 2022-04-22 MED ORDER — SULFAMETHOXAZOLE-TRIMETHOPRIM 800-160 MG PO TABS *I*
2.0000 | ORAL_TABLET | Freq: Once | ORAL | Status: AC
Start: 2022-04-22 — End: 2022-04-22
  Administered 2022-04-22: 2 via ORAL
  Filled 2022-04-22: qty 2

## 2022-04-22 MED ORDER — CEPHALEXIN 500 MG PO CAPS *I*: 500.0000 mg | ORAL_CAPSULE | Freq: Three times a day (TID) | ORAL | 0 refills | Status: DC

## 2022-04-22 NOTE — ED Provider Notes (Signed)
History     Chief Complaint   Patient presents with    Skin Problem     Is a 41 year old male who presents emergency department today with cellulitis and abscess of the lower extremity.  Patient's had cellulitis and abscess before, and states that this started several days ago, but worsened to the point where it started draining pus last night so came to the ED for evaluation today patient denies any fevers and chills, but states his leg is sore.  Patient has had sepsis before, but has not had to have them drained, did recover on antibiotics in the past.       History provided by:  Patient  Skin Problem  Location:  Leg  Leg rash location:  R lower leg  Quality: redness and swelling    Severity:  Mild  Onset quality:  Gradual  Duration:  3 days  Timing:  Constant  Progression:  Worsening  Chronicity:  New  Relieved by:  Nothing  Worsened by:  Nothing  Ineffective treatments:  None tried  Associated symptoms: no abdominal pain, no fatigue, no fever, no myalgias, no nausea, no shortness of breath and not vomiting          Medical/Surgical/Family History     Past Medical History:   Diagnosis Date    Alcoholism     Bipolar disorder     Hypertension         Patient Active Problem List   Diagnosis Code    Alcohol dependence with alcohol-induced mood disorder F10.24    HTN (hypertension) I10    Salicylate overdose T39.091A    Suicide attempt T58.91XA    Male Erectile Disorder F52.8            Past Surgical History:   Procedure Laterality Date    TONSILLECTOMY      TYMPANOPLASTY            Social History     Tobacco Use    Smoking status: Every Day     Packs/day: 0.50     Types: Cigarettes   Substance Use Topics    Alcohol use: Yes     Comment: daily    Drug use: No             Review of Systems   Constitutional:  Negative for chills, fatigue and fever.   Respiratory:  Negative for cough and shortness of breath.    Cardiovascular:  Negative for chest pain.   Gastrointestinal:  Negative for abdominal distention, abdominal  pain, nausea and vomiting.   Musculoskeletal:  Negative for back pain and myalgias.   Skin:  Positive for color change. Negative for wound.        Abscess   Neurological:  Negative for dizziness, syncope, speech difficulty, weakness and numbness.   All other systems reviewed and are negative.      Physical Exam     Triage Vitals  Triage Start: Start, (04/22/22 1245)  First Recorded BP: (!) 149/110, Resp: 18, Temp: 36.7 C (98.1 F), Temp src: TEMPORAL Oxygen Therapy SpO2: 100 %, Oximetry Source: Lt Hand, O2 Device: None (Room air), Heart Rate: (!) 114, (04/22/22 1243)  .  First Pain Reported  0-10 Scale: 8, Pain Location/Orientation: Leg Right, (04/22/22 1243)       Physical Exam  Vitals and nursing note reviewed.   Constitutional:       General: He is not in acute distress.     Appearance: Normal appearance.  HENT:      Head: Normocephalic and atraumatic.   Eyes:      Extraocular Movements: Extraocular movements intact.      Pupils: Pupils are equal, round, and reactive to light.   Cardiovascular:      Rate and Rhythm: Normal rate and regular rhythm.      Heart sounds: No murmur heard.  Pulmonary:      Effort: Pulmonary effort is normal. No respiratory distress.      Breath sounds: Normal breath sounds.   Abdominal:      General: Abdomen is flat. Bowel sounds are normal. There is no distension.      Palpations: Abdomen is soft.      Tenderness: There is no abdominal tenderness.   Musculoskeletal:         General: Normal range of motion.      Cervical back: Normal range of motion and neck supple.   Skin:     General: Skin is warm and dry.      Comments: 3x3cm abscess to the RLE laterally over the tibialis anterior region, leg is soft, but there is purulent drainage from the abscess   Neurological:      General: No focal deficit present.      Mental Status: He is alert and oriented to person, place, and time.      Cranial Nerves: No cranial nerve deficit.         Medical Decision Making   Patient seen by me on:   04/22/2022    Assessment:  Is a 41 year old male who presents emergency department today for evaluation of an abscess of his right lower extremity    Differential diagnosis:  Abscess, cellulitis, doubt necrotizing soft tissue infection    Plan:  Incision and drainage, antibiotics and likely discharge    Review of existing & external labs / records: Patient has had history of pilonidal cysts and other abscesses in the past    Consideration of hospitalization: No    Other MDM elements: History per patient and chart review    ED Course and Disposition:      12:50 PM patient seen and evaluated, patient does have a large abscess to his right lower extremity with surrounding.  His leg is soft, he is not tachycardic, and is afebrile in the emergency department.  Based on this, I will perform an incision and drainage, and continue to monitor.    1:15 PM incision and drainage was performed, which drained a copious amount of purulent material.  I did packing the wound with iodoform gauze, and will have him follow-up with his PCP at the [place].  I did give him a dose of antibiotics in the emergency department, and did send a prescription for him to pick up at the pharmacy.  Patient is given return precautions emergency department, including fevers and chills, and worsening pain.  He understands this, and can be safely discharged at this time              Manya Silvas, DO            Manya Silvas, DO  04/22/22 1329

## 2022-04-22 NOTE — Discharge Instructions (Signed)
You were seen in the ED today for cellulitis of the right lower extremity.  There was a large abscess which was drained and packed in the ED.      Please take your antibiotics as instructed.  You were given a dose in the ED, and start them later tonight.    Follow-up with the [facility] as instructed.  If you develop fevers and chills, or worsening redness, come back to the emergency department.

## 2022-04-22 NOTE — ED Triage Notes (Signed)
Pt reports infection to RLE. Has redness,swelling, and pain with weight bearing. Has been on antibiotic for this before.

## 2022-04-22 NOTE — ED Procedure Documentation (Signed)
Procedures   Incision and drainage    Performed by: Manya Silvas, DO  Authorized by: Manya Silvas, DO    Consent:     Consent obtained:  Verbal    Consent given by:  Patient    Risks discussed:  Incomplete drainage, infection and pain    Alternatives discussed:  No treatment and delayed treatment  Universal protocol:     Procedure explained and questions answered to patient or proxy's satisfaction: yes      Immediately prior to procedure a time out was called: yes      Patient identity confirmed:  Verbally with patient  Location:     Type:  Abscess    Size:  3x3cm    Location:  Lower extremity    Lower extremity location:  Leg    Leg location:  R lower leg  Pre-procedure details:     Skin preparation:  Chloraprep  Anesthesia:     Anesthesia method:  Local infiltration    Local anesthetic:  Lidocaine 1% WITH epi  Procedure details:     Needle aspiration: no      Incision types:  Single straight    Incision depth:  Subcutaneous    Scalpel blade:  11    Wound management:  Probed and deloculated and irrigated with saline    Drainage:  Purulent    Drainage amount:  Moderate    Wound treatment:  Drain placed    Packing materials:  1/4 in iodoform gauze    Amount 1/4" iodoform:  Several inches  Post-procedure details:     Patient tolerance of procedure:  Tolerated well, no immediate complications      Manya Silvas, DO     Manya Silvas, DO  04/22/22 1313

## 2022-04-24 ENCOUNTER — Other Ambulatory Visit: Payer: Self-pay

## 2022-04-24 ENCOUNTER — Emergency Department
Admission: EM | Admit: 2022-04-24 | Discharge: 2022-04-24 | Disposition: A | Discharge status: Home / Self Care | Payer: Non-veteran care | Source: Ambulatory Visit | Attending: Emergency Medicine | Admitting: Emergency Medicine

## 2022-04-24 DIAGNOSIS — M7989 Other specified soft tissue disorders: Secondary | ICD-10-CM

## 2022-04-24 DIAGNOSIS — L02415 Cutaneous abscess of right lower limb: Secondary | ICD-10-CM | POA: Insufficient documentation

## 2022-04-24 DIAGNOSIS — L0291 Cutaneous abscess, unspecified: Secondary | ICD-10-CM

## 2022-04-24 DIAGNOSIS — L03115 Cellulitis of right lower limb: Secondary | ICD-10-CM | POA: Insufficient documentation

## 2022-04-24 DIAGNOSIS — F17210 Nicotine dependence, cigarettes, uncomplicated: Secondary | ICD-10-CM | POA: Insufficient documentation

## 2022-04-24 DIAGNOSIS — R21 Rash and other nonspecific skin eruption: Secondary | ICD-10-CM

## 2022-04-24 LAB — CBC AND DIFFERENTIAL
Baso # K/uL: 0.1 10*3/uL (ref 0.0–0.2)
Basophil %: 1 %
Eos # K/uL: 0.3 10*3/uL (ref 0.0–0.5)
Eosinophil %: 2.7 %
Hematocrit: 46 % (ref 37–52)
Hemoglobin: 15.7 g/dL (ref 12.0–17.0)
IMM Granulocytes #: 0.1 10*3/uL — ABNORMAL HIGH (ref 0.0–0.0)
IMM Granulocytes: 0.5 %
Lymph # K/uL: 2.6 10*3/uL (ref 1.0–5.0)
Lymphocyte %: 25.7 %
MCH: 31 pg (ref 26–32)
MCHC: 34 g/dL (ref 32–37)
MCV: 92 fL (ref 75–100)
Mono # K/uL: 0.9 10*3/uL (ref 0.1–1.0)
Monocyte %: 8.3 %
Neut # K/uL: 6.4 10*3/uL (ref 1.5–6.5)
Nucl RBC # K/uL: 0 10*3/uL (ref 0.0–0.0)
Nucl RBC %: 0 /100 WBC (ref 0.0–0.2)
Platelets: 281 10*3/uL (ref 150–450)
RBC: 5.1 MIL/uL (ref 4.0–6.0)
RDW: 13 % (ref 0.0–15.0)
Seg Neut %: 61.8 %
WBC: 10.3 10*3/uL (ref 3.5–11.0)

## 2022-04-24 LAB — BASIC METABOLIC PANEL
Anion Gap: 15 (ref 7–16)
CO2: 20 mmol/L (ref 20–28)
Calcium: 9.1 mg/dL (ref 9.0–10.3)
Chloride: 101 mmol/L (ref 96–108)
Creatinine: 1.04 mg/dL (ref 0.67–1.17)
Glucose: 113 mg/dL — ABNORMAL HIGH (ref 60–99)
Lab: 13 mg/dL (ref 6–20)
Potassium: 4.4 mmol/L (ref 3.3–4.6)
Sodium: 136 mmol/L (ref 133–145)
eGFR BY CREAT: 92 *

## 2022-04-24 LAB — LACTATE, PLASMA: Lactate: 2 mmol/L (ref 0.5–2.2)

## 2022-04-24 MED ORDER — DOXYCYCLINE HYCLATE 100 MG PO TABS *I*
100.0000 mg | ORAL_TABLET | Freq: Two times a day (BID) | ORAL | 0 refills | Status: AC
Start: 2022-04-24 — End: 2022-05-01

## 2022-04-24 MED ORDER — SODIUM CHLORIDE 0.9 % IV BOLUS *I*
1000.0000 mL | Freq: Once | Status: AC
Start: 2022-04-24 — End: 2022-04-24
  Administered 2022-04-24: 1000 mL via INTRAVENOUS

## 2022-04-24 MED ORDER — CEFTRIAXONE SODIUM 1 G IN STERILE WATER 10ML SYRINGE *I*
1000.0000 mg | Freq: Once | INTRAVENOUS | Status: AC
Start: 2022-04-24 — End: 2022-04-24
  Administered 2022-04-24: 1000 mg via INTRAVENOUS
  Filled 2022-04-24: qty 10

## 2022-04-24 MED ORDER — LIDOCAINE-EPINEPHRINE 1 %-1:100000 IJ SOLN *I*
1.0000 mL | Freq: Once | INTRAMUSCULAR | Status: AC
Start: 2022-04-24 — End: 2022-04-24
  Administered 2022-04-24: 1 mL via SUBCUTANEOUS
  Filled 2022-04-24: qty 20

## 2022-04-24 MED ORDER — AMOXICILLIN-POT CLAVULANATE 875-125 MG PO TABS *I*: 1.0000 | ORAL_TABLET | Freq: Two times a day (BID) | ORAL | 0 refills | Status: AC

## 2022-04-24 MED ORDER — SODIUM CHLORIDE 0.9 % 250 ML IV SOLN *I*
100.0000 mg | Freq: Once | INTRAVENOUS | Status: AC
Start: 2022-04-24 — End: 2022-04-24
  Administered 2022-04-24: 100 mg via INTRAVENOUS
  Filled 2022-04-24: qty 10

## 2022-04-24 NOTE — ED Provider Notes (Signed)
History     Chief Complaint   Patient presents with    Cellulitis     Right lower leg      41 yo male w/ hx Alcoholism, Bipolar, HTN, ED, Salicylate O/D, Suicide Attempt presents w/ spreading and worsening R lower leg redness and swelling w/ persistent purulent drainage over the past 2 days s/p and despite I&D of abscess in that location 2 days ago, compliant on home Keflex/Bactrim 10/28.  Pt denies associated worsening pain, fever, chills,diaphoresis, lymphangitic streaking, numbness, weakness.      History provided by:  Patient        Medical/Surgical/Family History     Past Medical History:   Diagnosis Date    Alcoholism     Bipolar disorder     Hypertension         Patient Active Problem List   Diagnosis Code    Alcohol dependence with alcohol-induced mood disorder F10.24    HTN (hypertension) I10    Salicylate overdose T39.091A    Suicide attempt T45.91XA    Male Erectile Disorder F52.8            Past Surgical History:   Procedure Laterality Date    TONSILLECTOMY      TYMPANOPLASTY            Social History     Tobacco Use    Smoking status: Every Day     Packs/day: 0.50     Types: Cigarettes   Substance Use Topics    Alcohol use: Yes     Comment: daily    Drug use: No             Review of Systems   Constitutional:  Negative for appetite change, chills, diaphoresis, fatigue and fever.   HENT:  Negative for congestion, sore throat and trouble swallowing.    Eyes:  Negative for visual disturbance.   Respiratory:  Negative for cough and shortness of breath.    Cardiovascular:  Negative for chest pain, palpitations and leg swelling.   Gastrointestinal:  Negative for abdominal pain, constipation, diarrhea, nausea and vomiting.   Genitourinary:  Negative for difficulty urinating, dysuria, flank pain and hematuria.   Musculoskeletal:  Negative for back pain, neck pain and neck stiffness.   Skin:  Positive for rash and wound.   Neurological:  Negative for dizziness, syncope, weakness, light-headedness, numbness  and headaches.   Psychiatric/Behavioral:  Negative for confusion and suicidal ideas.        Physical Exam     Triage Vitals  Triage Start: Start, (04/24/22 1801)  First Recorded BP: (!) 136/96, Resp: 18, Temp: 36.5 C (97.7 F), Temp src: TEMPORAL Oxygen Therapy SpO2: 98 %, Oximetry Source: Lt Hand, O2 Device: None (Room air), Heart Rate: 100, (04/24/22 1802)  .      Physical Exam  Vitals and nursing note reviewed.   Constitutional:       General: He is not in acute distress.     Appearance: He is well-developed. He is not diaphoretic.   HENT:      Head: Normocephalic and atraumatic.      Right Ear: External ear normal.      Left Ear: External ear normal.      Nose: Nose normal.      Mouth/Throat:      Mouth: Mucous membranes are moist.      Pharynx: Oropharynx is clear. No oropharyngeal exudate.   Eyes:      Extraocular Movements: Extraocular movements  intact.      Conjunctiva/sclera: Conjunctivae normal.      Pupils: Pupils are equal, round, and reactive to light.   Neck:      Trachea: No tracheal deviation.   Cardiovascular:      Rate and Rhythm: Normal rate and regular rhythm.      Pulses: Normal pulses.      Heart sounds: Normal heart sounds.   Pulmonary:      Effort: Pulmonary effort is normal. No respiratory distress.      Breath sounds: Normal breath sounds. No stridor.   Abdominal:      General: Bowel sounds are normal. There is no distension.      Palpations: Abdomen is soft.      Tenderness: There is no abdominal tenderness.   Musculoskeletal:         General: No tenderness or deformity. Normal range of motion.      Cervical back: Normal range of motion and neck supple.   Skin:     General: Skin is warm and dry.      Capillary Refill: Capillary refill takes less than 2 seconds.      Findings: Erythema and rash present.      Comments: R Lateral shin with fluctuant abscess, recently I&D'd w/ packing in place and continuing to drain purulent drainage w/ surrounding erythema, which has spread all the down to  proximal ankle.  No Lymphangitic streaking or circumferential swelling noted.   Neurological:      General: No focal deficit present.      Mental Status: He is alert and oriented to person, place, and time. Mental status is at baseline.      Motor: No abnormal muscle tone.      Coordination: Coordination normal.   Psychiatric:         Behavior: Behavior normal.         Thought Content: Thought content normal.         Judgment: Judgment normal.       Medical Decision Making   Patient seen by me on:  04/24/2022    Assessment:  41 yo male w/ hx Alcoholism, Bipolar, HTN, ED, Salicylate O/D, Suicide Attempt presents w/ spreading and worsening R lower leg redness and swelling w/ persistent purulent drainage over the past 2 days s/p and despite I&D of abscess in that location 2 days ago, compliant on home Keflex/Bactrim 10/28.  Pt denies associated worsening pain, fever, chills,diaphoresis, lymphangitic streaking, numbness, weakness.Afebrile, stable vitals, R Lateral shin with fluctuant abscess, recently I&D'd w/ packing in place and continuing to drain purulent drainage w/ surrounding erythema, which has spread all the down to proximal ankle.  No Lymphangitic streaking or circumferential swelling noted.    Differential diagnosis:  Abscess, Cellulitis, Bacteremia, Sepsis    Plan:  Orders Placed This Encounter      Blood culture      Blood culture      CBC and differential      Basic metabolic panel      Lactate, plasma      Lactate, plasma (CONDITIONAL)      Insert peripheral IV  IV CTX, IV Doxycycline      Review of existing & external labs / records: Blood work grossly WNL, benign and reassuring    ED Course and Disposition:  I&D site revised, de-loculated irrigated and packing removed.  Drained a moderate amount of purulent and serosanguinous material.  Pt feels improved and Erythema receding s/p ED tx.  Serial vitals and exams benign.  D/C to close outpt PCP f/up w/ Rx PO Augmentin/Doxy, antic guidance and return  precautions.   Pt understands and agrees w/ D/C plan.                 Alfonzo Feller, MD          Author:  Alfonzo Feller, MD       Alfonzo Feller, MD  04/24/22 2104

## 2022-04-24 NOTE — Discharge Instructions (Addendum)
Take Augmentin and Doxycycline as prescribed.    Keep wound clean and dry.    See your Primary Doctor for re-check in 1 week.    Return to ED for any worsening or concerning symptoms.

## 2022-04-24 NOTE — ED Triage Notes (Signed)
Pt presents to the ED today from home for c/o cellulitis- right lower leg redness and swelling. Pt reports being seen Saturday 10/28 had an abscess on his right lower leg drained and now has increased redness and swelling- He is currently taking Keflex and Bactrim, He denies fever. Pt is ambulatory with steady gait and appears in NAD at time of triage           Prehospital medications given: No

## 2022-04-24 NOTE — ED Procedure Documentation (Signed)
Procedures   Incision and drainage    Performed by: Alfonzo Feller, MD  Authorized by: Alfonzo Feller, MD    Consent:     Consent obtained:  Verbal    Consent given by:  Patient    Risks discussed:  Bleeding, damage to other organs, infection, incomplete drainage and pain    Alternatives discussed:  Alternative treatment  Universal protocol:     Procedure explained and questions answered to patient or proxy's satisfaction: yes      Immediately prior to procedure a time out was called: yes      Patient identity confirmed:  Verbally with patient and arm band  Location:     Type:  Abscess    Size:  4 CM    Location:  Lower extremity    Lower extremity location:  Leg    Leg location:  R lower leg  Pre-procedure details:     Skin preparation:  Chloraprep  Anesthesia:     Anesthesia method:  Local infiltration    Local anesthetic:  Lidocaine 1% WITH epi  Procedure details:     Wound management:  Probed and deloculated, irrigated with saline, extensive cleaning and debrided    Drainage:  Purulent and serosanguinous    Drainage amount:  Moderate    Wound treatment:  Wound left open  Post-procedure details:     Patient tolerance of procedure:  Tolerated well, no immediate complications      Alfonzo Feller, MD     Alfonzo Feller, MD  04/24/22 2105

## 2022-04-30 LAB — BLOOD CULTURE
Bacterial Blood Culture: 0
Bacterial Blood Culture: 0

## 2022-06-27 IMAGING — CT CT HEAD W/O CM
3 of 6 series · 16 of 47 positions shown, 19 images · non-contrast
Comparison: 05/03/2012

CLINICAL DATA: 39-year-old male found unresponsive, cerebral
hemorrhage suspected.

EXAM:
CT HEAD WITHOUT CONTRAST
TECHNIQUE: Contiguous axial images were obtained from the base of the skull
through the vertex without intravenous contrast.

[Series 4: head 5.0 h30s · axial · 0.46mm/px · z∈[-64,+71]mm · 11 of 33 slices shown, 14 images]
[im 3/33  brain]
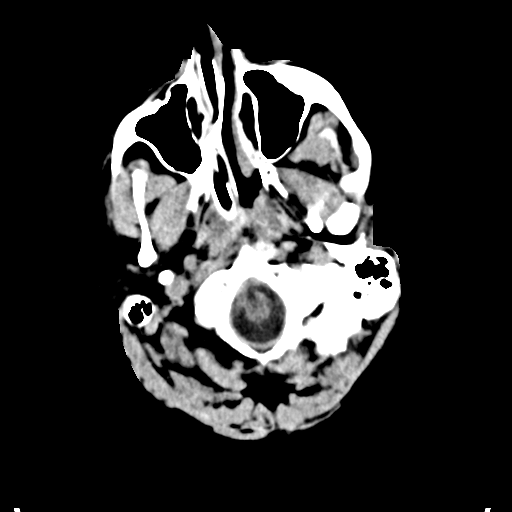
[im 3/33  bone]
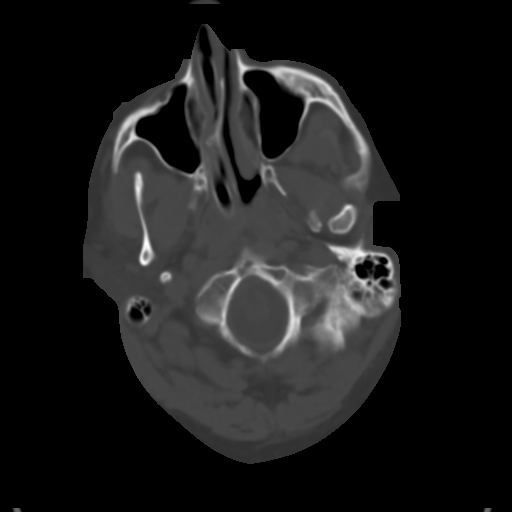
[im 5/33  brain]
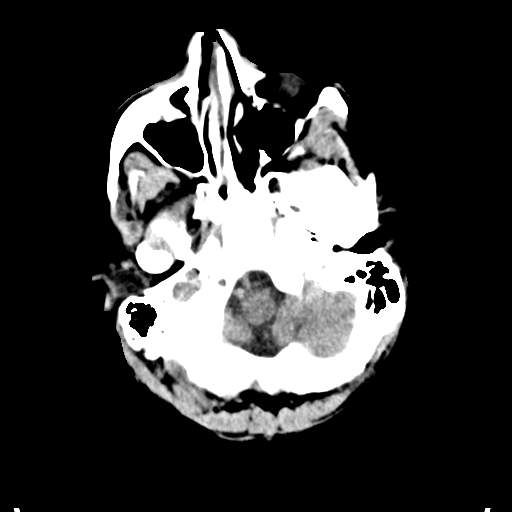
[im 7/33  brain]
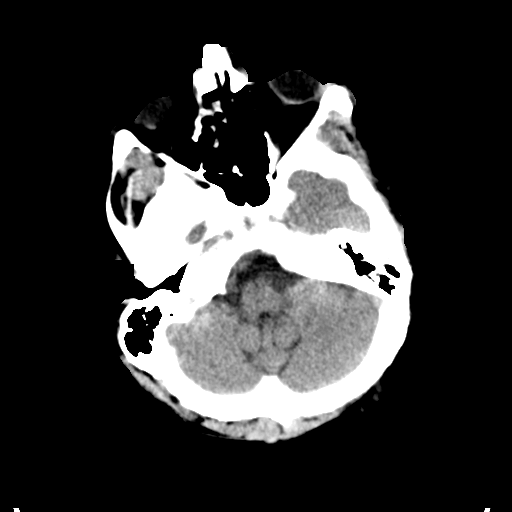
[im 12/33  brain]
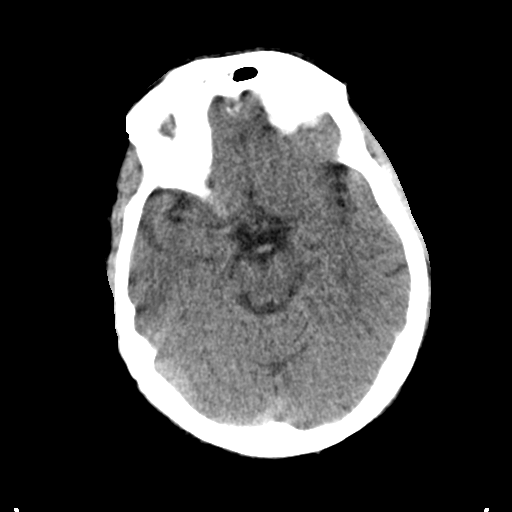
[im 14/33  brain]
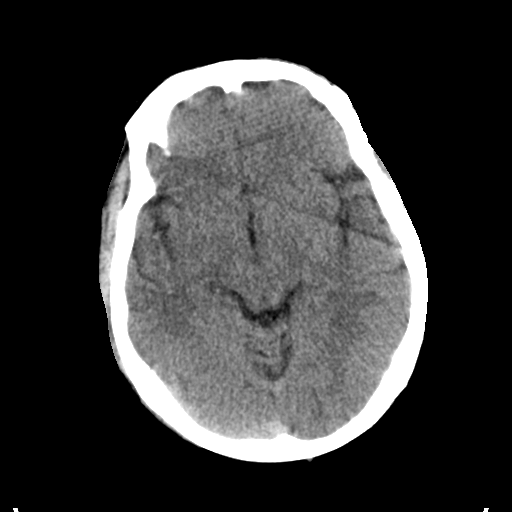
[im 14/33  bone]
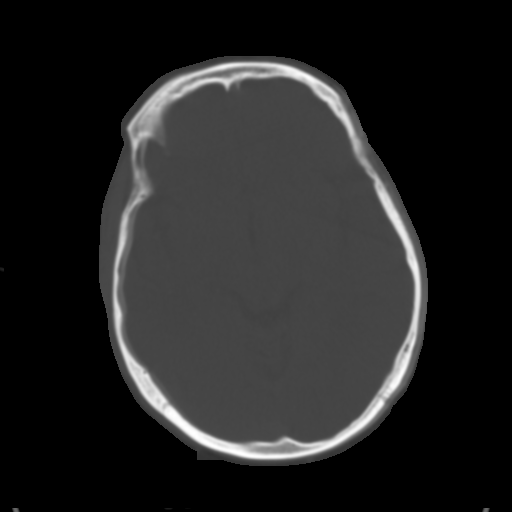
[im 17/33  brain]
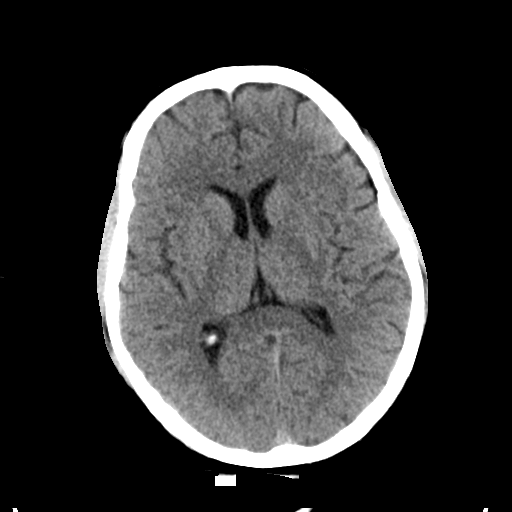
[im 19/33  brain]
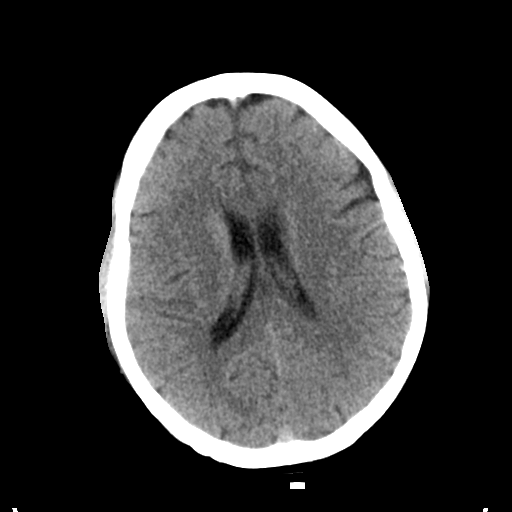
[im 21/33  brain]
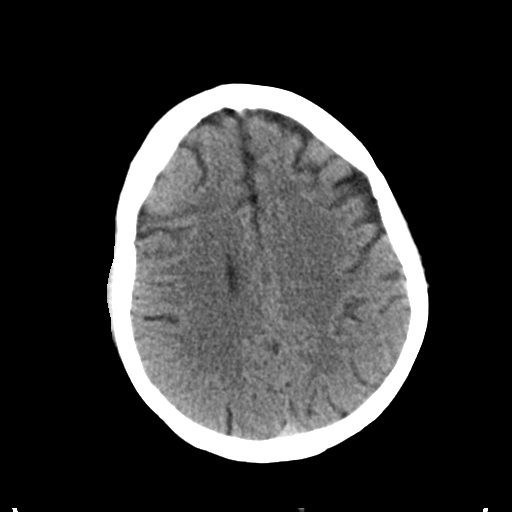
[im 26/33  brain]
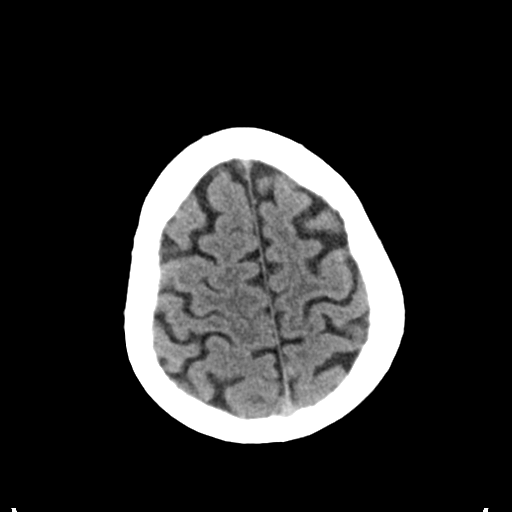
[im 26/33  bone]
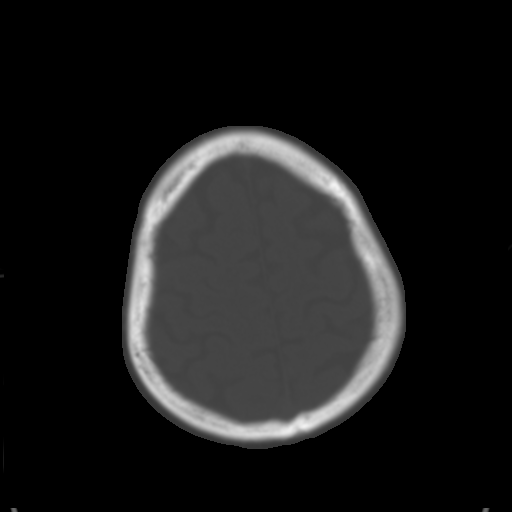
[im 28/33  brain]
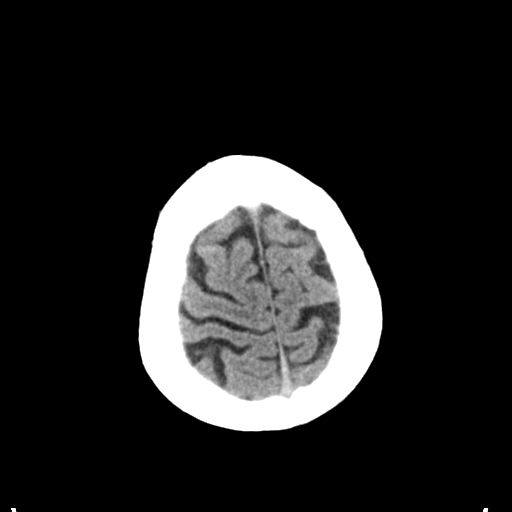
[im 30/33  brain]
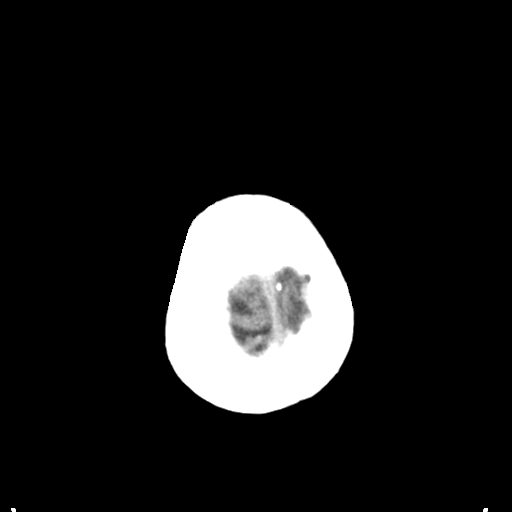

[Series 9: head 3.0 mpr cor · coronal · 0.34mm/px · 3 of 70 slices shown]
[im 18/70  brain]
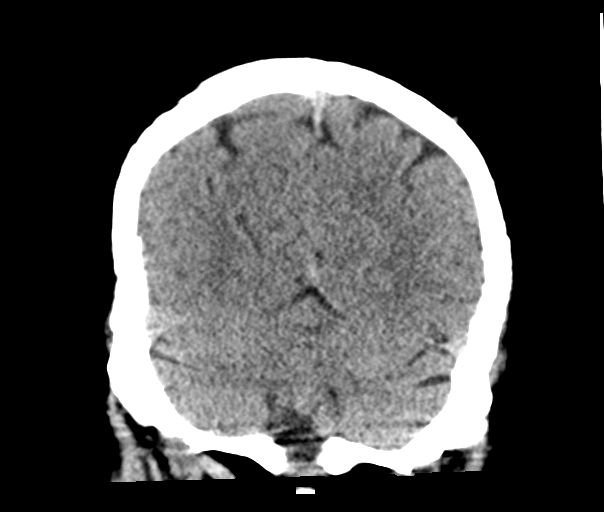
[im 35/70  brain]
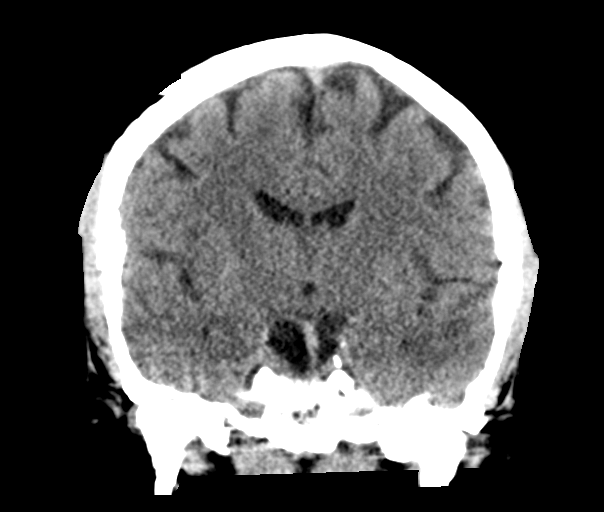
[im 52/70  brain]
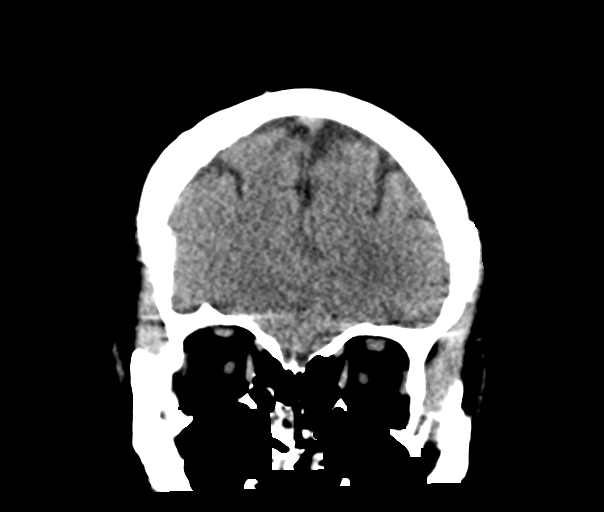

[Series 10: head 3.0 mpr sag · sagittal · 0.35mm/px · 2 of 58 slices shown]
[im 20/58  brain]
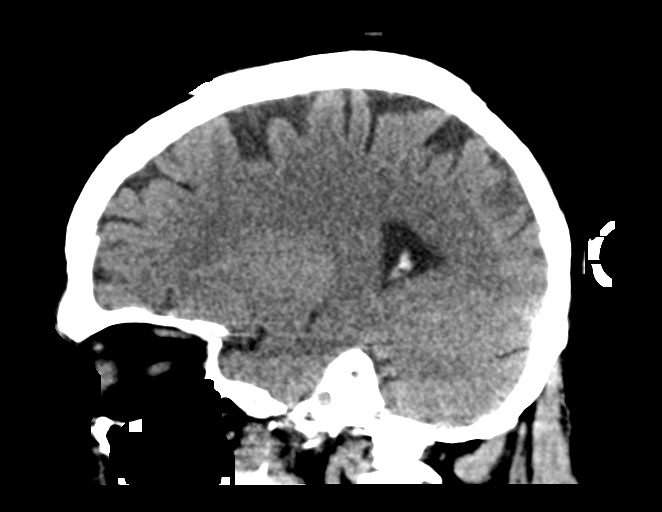
[im 39/58  brain]
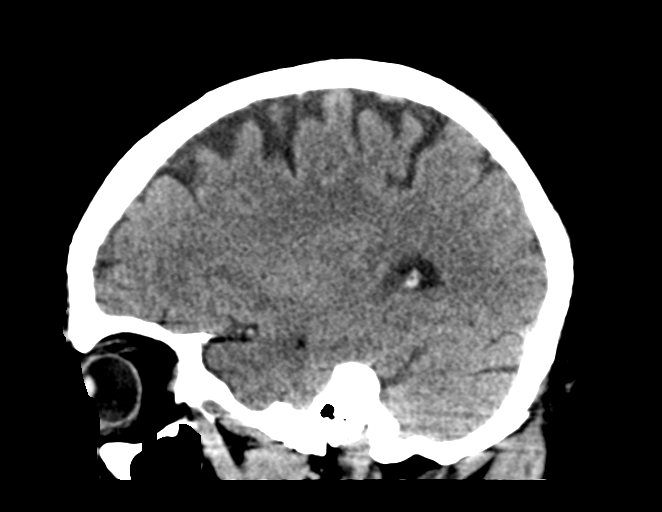

[16 of 47 positions shown; findings below may reference images not displayed]

FINDINGS: Brain: No evidence of acute infarction, hemorrhage, hydrocephalus,
extra-axial collection or mass lesion/mass effect.

Vascular: No hyperdense vessel or unexpected calcification.

Skull: Normal. Negative for fracture or focal lesion.

Sinuses/Orbits: No acute finding.

Other: None.
IMPRESSION: No acute intracranial abnormality.

## 2022-06-29 ENCOUNTER — Emergency Department: Payer: Non-veteran care

## 2022-06-29 ENCOUNTER — Inpatient Hospital Stay: Payer: Non-veteran care

## 2022-06-29 ENCOUNTER — Inpatient Hospital Stay
Admission: EM | Admit: 2022-06-29 | Discharge: 2022-07-03 | DRG: 917 | Disposition: A | Discharge status: Home / Self Care | Payer: Non-veteran care | Source: Ambulatory Visit | Attending: Internal Medicine | Admitting: Internal Medicine

## 2022-06-29 ENCOUNTER — Other Ambulatory Visit: Payer: Self-pay

## 2022-06-29 ENCOUNTER — Encounter: Payer: Self-pay | Admitting: Gastroenterology

## 2022-06-29 ENCOUNTER — Encounter: Payer: Self-pay | Admitting: Pulmonology

## 2022-06-29 DIAGNOSIS — F17210 Nicotine dependence, cigarettes, uncomplicated: Secondary | ICD-10-CM | POA: Diagnosis present

## 2022-06-29 DIAGNOSIS — Y92009 Unspecified place in unspecified non-institutional (private) residence as the place of occurrence of the external cause: Secondary | ICD-10-CM

## 2022-06-29 DIAGNOSIS — F319 Bipolar disorder, unspecified: Secondary | ICD-10-CM | POA: Diagnosis present

## 2022-06-29 DIAGNOSIS — G4733 Obstructive sleep apnea (adult) (pediatric): Secondary | ICD-10-CM | POA: Diagnosis present

## 2022-06-29 DIAGNOSIS — E875 Hyperkalemia: Secondary | ICD-10-CM

## 2022-06-29 DIAGNOSIS — F10139 Alcohol abuse with withdrawal, unspecified: Secondary | ICD-10-CM | POA: Diagnosis present

## 2022-06-29 DIAGNOSIS — G929 Unspecified toxic encephalopathy: Secondary | ICD-10-CM

## 2022-06-29 DIAGNOSIS — R9431 Abnormal electrocardiogram [ECG] [EKG]: Secondary | ICD-10-CM

## 2022-06-29 DIAGNOSIS — R918 Other nonspecific abnormal finding of lung field: Secondary | ICD-10-CM

## 2022-06-29 DIAGNOSIS — Z9151 Personal history of suicidal behavior: Secondary | ICD-10-CM

## 2022-06-29 DIAGNOSIS — E873 Alkalosis: Secondary | ICD-10-CM | POA: Diagnosis not present

## 2022-06-29 DIAGNOSIS — R0902 Hypoxemia: Secondary | ICD-10-CM | POA: Diagnosis present

## 2022-06-29 DIAGNOSIS — R339 Retention of urine, unspecified: Secondary | ICD-10-CM | POA: Diagnosis present

## 2022-06-29 DIAGNOSIS — R600 Localized edema: Secondary | ICD-10-CM | POA: Diagnosis not present

## 2022-06-29 DIAGNOSIS — I10 Essential (primary) hypertension: Secondary | ICD-10-CM

## 2022-06-29 DIAGNOSIS — N179 Acute kidney failure, unspecified: Secondary | ICD-10-CM | POA: Diagnosis present

## 2022-06-29 DIAGNOSIS — T50904A Poisoning by unspecified drugs, medicaments and biological substances, undetermined, initial encounter: Principal | ICD-10-CM

## 2022-06-29 DIAGNOSIS — I5A Non-ischemic myocardial injury (non-traumatic): Secondary | ICD-10-CM | POA: Diagnosis present

## 2022-06-29 DIAGNOSIS — R682 Dry mouth, unspecified: Secondary | ICD-10-CM | POA: Diagnosis present

## 2022-06-29 DIAGNOSIS — G928 Other toxic encephalopathy: Secondary | ICD-10-CM | POA: Diagnosis present

## 2022-06-29 DIAGNOSIS — J189 Pneumonia, unspecified organism: Secondary | ICD-10-CM | POA: Diagnosis present

## 2022-06-29 DIAGNOSIS — T50914A Poisoning by multiple unspecified drugs, medicaments and biological substances, undetermined, initial encounter: Principal | ICD-10-CM | POA: Diagnosis present

## 2022-06-29 DIAGNOSIS — R000 Tachycardia, unspecified: Secondary | ICD-10-CM | POA: Diagnosis present

## 2022-06-29 DIAGNOSIS — G9340 Encephalopathy, unspecified: Secondary | ICD-10-CM

## 2022-06-29 DIAGNOSIS — K7010 Alcoholic hepatitis without ascites: Secondary | ICD-10-CM | POA: Diagnosis present

## 2022-06-29 DIAGNOSIS — M6282 Rhabdomyolysis: Secondary | ICD-10-CM

## 2022-06-29 DIAGNOSIS — R569 Unspecified convulsions: Secondary | ICD-10-CM | POA: Diagnosis present

## 2022-06-29 DIAGNOSIS — R61 Generalized hyperhidrosis: Secondary | ICD-10-CM | POA: Diagnosis present

## 2022-06-29 DIAGNOSIS — F1123 Opioid dependence with withdrawal: Secondary | ICD-10-CM | POA: Diagnosis present

## 2022-06-29 DIAGNOSIS — R4182 Altered mental status, unspecified: Secondary | ICD-10-CM

## 2022-06-29 DIAGNOSIS — Z20822 Contact with and (suspected) exposure to covid-19: Secondary | ICD-10-CM

## 2022-06-29 LAB — URINALYSIS REFLEX TO CULTURE
Ketones, UA: NEGATIVE
Leuk Esterase,UA: NEGATIVE
Nitrite,UA: NEGATIVE
Protein,UA: NEGATIVE
Specific Gravity,UA: 1.01 (ref 1.002–1.030)
pH,UA: 6 (ref 5.0–8.0)

## 2022-06-29 LAB — VENOUS GASES / WHOLE BLOOD PANEL
Base Excess,VENOUS: -4 mmol/L — ABNORMAL LOW (ref <=2)
Base Excess,VENOUS: -5 mmol/L — ABNORMAL LOW (ref <=2)
Base Excess,VENOUS: -3 mmol/L (ref <=2)
Bicarbonate,VENOUS: 20 mmol/L — ABNORMAL LOW (ref 21–28)
Bicarbonate,VENOUS: 21 mmol/L (ref 21–28)
Bicarbonate,VENOUS: 22 mmol/L (ref 21–28)
CO2 (Calc),VENOUS: 21 mmol/L — ABNORMAL LOW (ref 22–31)
CO2 (Calc),VENOUS: 22 mmol/L (ref 22–31)
CO2 (Calc),VENOUS: 23 mmol/L (ref 22–31)
CO: 2.6 %
CO: 0.8 %
CO: 0.5 %
FO2 HB,VENOUS: 91 % — ABNORMAL HIGH (ref 63–83)
FO2 HB,VENOUS: 95 % — ABNORMAL HIGH (ref 63–83)
FO2 HB,VENOUS: 78 % (ref 63–83)
Glucose,WB: 136 mg/dL — ABNORMAL HIGH (ref 60–99)
Glucose,WB: 129 mg/dL — ABNORMAL HIGH (ref 60–99)
Glucose,WB: 133 mg/dL — ABNORMAL HIGH (ref 60–99)
Hemoglobin: 17.1 g/dL — ABNORMAL HIGH (ref 12.0–17.0)
Hemoglobin: 16.9 g/dL (ref 12.0–17.0)
Hemoglobin: 16.4 g/dL (ref 12.0–17.0)
ICA @7.4,WB: 4.3 mg/dL — ABNORMAL LOW (ref 4.8–5.2)
ICA @7.4,WB: 4.5 mg/dL — ABNORMAL LOW (ref 4.8–5.2)
ICA @7.4,WB: 4.8 mg/dL (ref 4.8–5.2)
ICA Uncorr,WB: 4.3 mg/dL — ABNORMAL LOW (ref 4.8–5.2)
ICA Uncorr,WB: 4.6 mg/dL — ABNORMAL LOW (ref 4.8–5.2)
ICA Uncorr,WB: 4.9 mg/dL (ref 4.8–5.2)
Lactate VEN,WB: 2.9 mmol/L — ABNORMAL HIGH (ref 0.5–2.2)
Lactate VEN,WB: 1.8 mmol/L (ref 0.5–2.2)
Lactate VEN,WB: 2.8 mmol/L — ABNORMAL HIGH (ref 0.5–2.2)
Methemoglobin: 0.8 % (ref 0.0–1.0)
Methemoglobin: 0.3 % (ref 0.0–1.0)
Methemoglobin: 0.5 % (ref 0.0–1.0)
NA, WB: 132 mmol/L — ABNORMAL LOW (ref 135–145)
NA, WB: 132 mmol/L — ABNORMAL LOW (ref 135–145)
NA, WB: 135 mmol/L (ref 135–145)
PCO2,VENOUS: 35 mm Hg — ABNORMAL LOW (ref 40–50)
PCO2,VENOUS: 38 mm Hg — ABNORMAL LOW (ref 40–50)
PCO2,VENOUS: 39 mm Hg — ABNORMAL LOW (ref 40–50)
PH,VENOUS: 7.38 (ref 7.32–7.42)
PH,VENOUS: 7.35 (ref 7.32–7.42)
PH,VENOUS: 7.37 (ref 7.32–7.42)
PO2,VENOUS: 73 mm Hg — ABNORMAL HIGH (ref 25–43)
PO2,VENOUS: 94 mm Hg — ABNORMAL HIGH (ref 25–43)
PO2,VENOUS: 47 mm Hg — ABNORMAL HIGH (ref 25–43)
Potassium,WB: 6.9 mmol/L (ref 3.3–4.6)
Potassium,WB: 5.7 mmol/L — ABNORMAL HIGH (ref 3.3–4.6)
Potassium,WB: 4.3 mmol/L (ref 3.3–4.6)

## 2022-06-29 LAB — CBC AND DIFFERENTIAL
Baso # K/uL: 0 10*3/uL (ref 0.0–0.2)
Basophil %: 0.2 %
Eos # K/uL: 0 10*3/uL (ref 0.0–0.5)
Eosinophil %: 0.1 %
Hematocrit: 46 % (ref 37–52)
Hemoglobin: 16.3 g/dL (ref 12.0–17.0)
IMM Granulocytes #: 0.1 10*3/uL — ABNORMAL HIGH (ref 0.0–0.0)
IMM Granulocytes: 0.5 %
Lymph # K/uL: 1.3 10*3/uL (ref 1.0–5.0)
Lymphocyte %: 7.5 %
MCH: 32 pg (ref 26–32)
MCHC: 36 g/dL (ref 32–37)
MCV: 89 fL (ref 75–100)
Mono # K/uL: 1.4 10*3/uL — ABNORMAL HIGH (ref 0.1–1.0)
Monocyte %: 7.9 %
Neut # K/uL: 14.9 10*3/uL — ABNORMAL HIGH (ref 1.5–6.5)
Nucl RBC # K/uL: 0 10*3/uL (ref 0.0–0.0)
Nucl RBC %: 0 /100 WBC (ref 0.0–0.2)
Platelets: 281 10*3/uL (ref 150–450)
RBC: 5.1 MIL/uL (ref 4.0–6.0)
RDW: 13 % (ref 0.0–15.0)
Seg Neut %: 83.8 %
WBC: 17.8 10*3/uL — ABNORMAL HIGH (ref 3.5–11.0)

## 2022-06-29 LAB — HEPATIC FUNCTION PANEL
ALT: 121 U/L — ABNORMAL HIGH (ref 0–50)
AST: 425 U/L — ABNORMAL HIGH (ref 0–50)
Albumin: 3.7 g/dL (ref 3.5–5.2)
Alk Phos: 74 U/L (ref 40–130)
Bilirubin,Direct: <0.2 mg/dL (ref 0.0–0.3)
Bilirubin,Total: 0.8 mg/dL (ref 0.0–1.2)
Total Protein: 6 g/dL — ABNORMAL LOW (ref 6.3–7.7)

## 2022-06-29 LAB — CBC
Hematocrit: 44 % (ref 37–52)
Hemoglobin: 14.8 g/dL (ref 12.0–17.0)
MCH: 31 pg (ref 26–32)
MCHC: 34 g/dL (ref 32–37)
MCV: 92 fL (ref 75–100)
Platelets: 273 10*3/uL (ref 150–450)
RBC: 4.7 MIL/uL (ref 4.0–6.0)
RDW: 13 % (ref 0.0–15.0)
WBC: 13.1 10*3/uL — ABNORMAL HIGH (ref 3.5–11.0)

## 2022-06-29 LAB — TROPONIN T 3 HR W/ DELTA HIGH SENSITIVITY (IP/ED ONLY)
TROP T 0-3 HR DELTA High Sensitivity: 5 — ABNORMAL HIGH (ref 0–4)
TROP T 3 HR High Sensitivity: 24 ng/L — ABNORMAL HIGH (ref 0–14)

## 2022-06-29 LAB — BASIC METABOLIC PANEL
Anion Gap: 15 (ref 7–16)
Anion Gap: 13 (ref 7–16)
Anion Gap: 11 (ref 7–16)
CO2: 18 mmol/L — ABNORMAL LOW (ref 20–28)
CO2: 23 mmol/L (ref 20–28)
CO2: 23 mmol/L (ref 20–28)
Calcium: 9.1 mg/dL (ref 9.0–10.3)
Calcium: 9.3 mg/dL (ref 9.0–10.3)
Calcium: 9.2 mg/dL (ref 9.0–10.3)
Calcium: 9.2 mg/dL (ref 9.0–10.3)
Chloride: 94 mmol/L — ABNORMAL LOW (ref 96–108)
Chloride: 99 mmol/L (ref 96–108)
Chloride: 99 mmol/L (ref 96–108)
Chloride: 103 mmol/L (ref 96–108)
Creatinine: 1.57 mg/dL — ABNORMAL HIGH (ref 0.67–1.17)
Creatinine: 1.44 mg/dL — ABNORMAL HIGH (ref 0.67–1.17)
Creatinine: 1.19 mg/dL — ABNORMAL HIGH (ref 0.67–1.17)
Glucose: 147 mg/dL — ABNORMAL HIGH (ref 60–99)
Glucose: 132 mg/dL — ABNORMAL HIGH (ref 60–99)
Glucose: 119 mg/dL — ABNORMAL HIGH (ref 60–99)
Glucose: 108 mg/dL — ABNORMAL HIGH (ref 60–99)
Lab: 24 mg/dL — ABNORMAL HIGH (ref 6–20)
Lab: 23 mg/dL — ABNORMAL HIGH (ref 6–20)
Lab: 22 mg/dL — ABNORMAL HIGH (ref 6–20)
Lab: 20 mg/dL (ref 6–20)
Potassium: 4.7 mmol/L (ref 3.3–5.1)
Potassium: 4.8 mmol/L (ref 3.3–5.1)
Sodium: 127 mmol/L — ABNORMAL LOW (ref 133–145)
Sodium: 132 mmol/L — ABNORMAL LOW (ref 133–145)
Sodium: 135 mmol/L (ref 133–145)
Sodium: 137 mmol/L (ref 133–145)
eGFR BY CREAT: 56 * — AB
eGFR BY CREAT: 62 *
eGFR BY CREAT: 79 *

## 2022-06-29 LAB — RUQ PANEL (ED ONLY)
ALT: 101 U/L — ABNORMAL HIGH (ref 0–50)
Albumin: 4.8 g/dL (ref 3.5–5.2)
Albumin: 4.4 g/dL (ref 3.5–5.2)
Alk Phos: 86 U/L (ref 40–130)
Amylase: 62 U/L (ref 28–100)
Bilirubin,Direct: <0.2 mg/dL (ref 0.0–0.3)
Bilirubin,Direct: <0.2 mg/dL (ref 0.0–0.3)
Bilirubin,Total: 0.8 mg/dL (ref 0.0–1.2)
Lipase: 29 U/L (ref 13–60)
Lipase: 16 U/L (ref 13–60)
Total Protein: 6.6 g/dL (ref 6.3–7.7)

## 2022-06-29 LAB — URINE MICROSCOPIC (IQ200): Bacteria,UA: NONE SEEN

## 2022-06-29 LAB — VENOUS BLOOD GAS
Base Excess,VENOUS: 1 mmol/L (ref <=2)
Bicarbonate,VENOUS: 25 mmol/L (ref 21–28)
CO2 (Calc),VENOUS: 26 mmol/L (ref 22–31)
CO: 0.3 %
FO2 HB,VENOUS: 91 % — ABNORMAL HIGH (ref 63–83)
Hemoglobin: 15.5 g/dL (ref 12.0–17.0)
Methemoglobin: 0.5 % (ref 0.0–1.0)
PCO2,VENOUS: 38 mm Hg — ABNORMAL LOW (ref 40–50)
PH,VENOUS: 7.43 — ABNORMAL HIGH (ref 7.32–7.42)
PO2,VENOUS: 66 mm Hg — ABNORMAL HIGH (ref 25–43)

## 2022-06-29 LAB — COVID/INFLUENZA A & B/RSV NAAT (PCR)
COVID-19 NAAT (PCR): NEGATIVE
Influenza A NAAT (PCR): NEGATIVE
Influenza B NAAT (PCR): NEGATIVE
RSV NAAT (PCR): NEGATIVE

## 2022-06-29 LAB — EKG 12-LEAD
P: 1 deg
PR: 157 ms
QRS: 75 deg
QRSD: 92 ms
QT: 306 ms
QTc: 431 ms
Rate: 119 {beats}/min
T: 95 deg

## 2022-06-29 LAB — HOLD GREEN WITH GEL

## 2022-06-29 LAB — PERFORMING LAB

## 2022-06-29 LAB — CK
CK: 20917 U/L — ABNORMAL HIGH (ref 39–308)
CK: 15359 U/L — ABNORMAL HIGH (ref 39–308)
CK: 14313 U/L — ABNORMAL HIGH (ref 39–308)

## 2022-06-29 LAB — HOLD LAVENDER

## 2022-06-29 LAB — MAGNESIUM: Magnesium: 2.3 mg/dL (ref 1.6–2.5)

## 2022-06-29 LAB — HOLD GRAY

## 2022-06-29 LAB — BLOOD BANK HOLD PINK

## 2022-06-29 LAB — APTT

## 2022-06-29 LAB — ACETAMINOPHEN LEVEL: Acetaminophen: <5 ug/mL

## 2022-06-29 LAB — TROPONIN T 1 HR W/ DELTA HIGH SENSITIVITY
TROP T 0-1 HR DELTA High Sensitivity: 2 (ref 0–2)
TROP T 1 HR High Sensitivity: 21 ng/L — ABNORMAL HIGH (ref 0–11)

## 2022-06-29 LAB — T4, FREE: Free T4: 1.2 ng/dL (ref 0.9–1.7)

## 2022-06-29 LAB — NT-PRO BNP: NT-pro BNP: <50 pg/mL (ref 0–450)

## 2022-06-29 LAB — UNABLE TO PERFORM ADD-ON TESTING 1

## 2022-06-29 LAB — CRP: CRP: 5 mg/L (ref 0–8)

## 2022-06-29 LAB — POCT GLUCOSE
Glucose POCT: 139 mg/dL — ABNORMAL HIGH (ref 60–99)
Glucose POCT: 127 mg/dL — ABNORMAL HIGH (ref 60–99)
Glucose POCT: 136 mg/dL — ABNORMAL HIGH (ref 60–99)
Glucose POCT: 112 mg/dL — ABNORMAL HIGH (ref 60–99)

## 2022-06-29 LAB — PHOSPHORUS: Phosphorus: 3.8 mg/dL (ref 2.7–4.5)

## 2022-06-29 LAB — LACTATE, PLASMA: Lactate: 1.5 mmol/L (ref 0.5–2.2)

## 2022-06-29 LAB — ETHANOL: Ethanol: <10 mg/dL (ref 0–9)

## 2022-06-29 LAB — PROTIME-INR

## 2022-06-29 LAB — SALICYLATE LEVEL: Salicylate: <3 mg/dL — ABNORMAL LOW (ref 15.0–30.0)

## 2022-06-29 LAB — TSH: TSH: 1.66 u[IU]/mL (ref 0.27–4.20)

## 2022-06-29 LAB — POTASSIUM
Potassium: 6.8 mmol/L (ref 3.3–5.1)
Potassium: 4.8 mmol/L (ref 3.3–5.1)

## 2022-06-29 LAB — TROPONIN T 0 HR HIGH SENSITIVITY (IP/ED ONLY): TROP T 0 HR High Sensitivity: 19 ng/L — ABNORMAL HIGH (ref 0–11)

## 2022-06-29 MED ORDER — DIAZEPAM 5 MG PO TABS *I*
10.0000 mg | ORAL_TABLET | ORAL | Status: DC | PRN
Start: 2022-06-29 — End: 2022-06-29
  Administered 2022-06-29: 10 mg via ORAL
  Filled 2022-06-29: qty 2

## 2022-06-29 MED ORDER — FOLIC ACID 1 MG PO TABS *I*
1.0000 mg | ORAL_TABLET | Freq: Every day | ORAL | Status: DC
Start: 2022-06-30 — End: 2022-06-30

## 2022-06-29 MED ORDER — FUROSEMIDE 10 MG/ML IJ SOLN *I*
40.0000 mg | Freq: Once | INTRAMUSCULAR | Status: AC
Start: 2022-06-29 — End: 2022-06-29
  Administered 2022-06-29: 40 mg via INTRAVENOUS
  Filled 2022-06-29: qty 4

## 2022-06-29 MED ORDER — LACTATED RINGERS IV BOLUS *I*
1000.0000 mL | Freq: Once | INTRAVENOUS | Status: AC
Start: 2022-06-29 — End: 2022-06-29
  Administered 2022-06-29: 1000 mL via INTRAVENOUS

## 2022-06-29 MED ORDER — THIAMINE HCL 100 MG/ML IJ SOLN *I*
100.0000 mg | Freq: Every day | INTRAMUSCULAR | Status: DC
Start: 2022-06-29 — End: 2022-06-30
  Administered 2022-06-29: 100 mg via INTRAVENOUS
  Filled 2022-06-29: qty 2

## 2022-06-29 MED ORDER — PLASMA-LYTE IV SOLN *WRAPPED*
150.0000 mL/h | Status: DC
Start: 2022-06-29 — End: 2022-06-30
  Administered 2022-06-29 – 2022-06-30 (×6): 150 mL/h via INTRAVENOUS

## 2022-06-29 MED ORDER — DEXTROSE 5 % FLUSH FOR PUMPS *I*
0.0000 mL/h | INTRAVENOUS | Status: DC | PRN
Start: 2022-06-29 — End: 2022-07-03

## 2022-06-29 MED ORDER — SODIUM CHLORIDE 0.9 % FLUSH FOR PUMPS *I*
0.0000 mL/h | INTRAVENOUS | Status: DC | PRN
Start: 2022-06-29 — End: 2022-07-03

## 2022-06-29 MED ORDER — CALCIUM GLUCONATE 9.4 MEQ (2,000 MG) IN NS 110 ML *I*
9.4000 meq | Freq: Once | INTRAVENOUS | Status: AC
Start: 2022-06-29 — End: 2022-06-29
  Administered 2022-06-29: 9.4 meq via INTRAVENOUS
  Filled 2022-06-29: qty 110

## 2022-06-29 MED ORDER — SODIUM CHLORIDE 0.9 % FLUSH FOR PUMPS *I*
0.0000 mL/h | INTRAVENOUS | Status: DC | PRN
Start: 2022-06-29 — End: 2022-07-03
  Administered 2022-07-01: 100 mL/h via INTRAVENOUS

## 2022-06-29 MED ORDER — ENOXAPARIN SODIUM 40 MG/0.4ML IJ SOSY *I*
40.0000 mg | PREFILLED_SYRINGE | Freq: Every day | INTRAMUSCULAR | Status: DC
Start: 2022-06-29 — End: 2022-07-03
  Administered 2022-06-29 – 2022-07-02 (×3): 40 mg via SUBCUTANEOUS
  Filled 2022-06-29 (×3): qty 0.4

## 2022-06-29 MED ORDER — DEXTROSE 50 % IV SOLN *I*
25.0000 g | INTRAVENOUS | Status: AC | PRN
Start: 2022-06-29 — End: 2022-06-29
  Administered 2022-06-29: 25 g via INTRAVENOUS

## 2022-06-29 MED ORDER — INSULIN REGULAR HUMAN 100 UNIT/ML IJ SOLN *I*
10.0000 [IU] | Freq: Once | INTRAMUSCULAR | Status: AC
Start: 2022-06-29 — End: 2022-06-29
  Administered 2022-06-29: 10 [IU] via INTRAVENOUS
  Filled 2022-06-29: qty 3

## 2022-06-29 MED ORDER — DEXTROSE 50 % IV SOLN *I*
25.0000 g | Freq: Once | INTRAVENOUS | Status: AC
Start: 2022-06-29 — End: 2022-06-29
  Administered 2022-06-29: 25 g via INTRAVENOUS
  Filled 2022-06-29: qty 50

## 2022-06-29 MED ORDER — ACETAMINOPHEN 325 MG PO TABS *I*
650.0000 mg | ORAL_TABLET | Freq: Once | ORAL | Status: DC
Start: 2022-06-29 — End: 2022-06-30

## 2022-06-29 MED ORDER — ALBUTEROL SULFATE (2.5 MG/3ML) 0.083% IN NEBU *I*
2.5000 mg | INHALATION_SOLUTION | RESPIRATORY_TRACT | Status: DC | PRN
Start: 2022-06-29 — End: 2022-07-03

## 2022-06-29 NOTE — ED Provider Notes (Signed)
History     Chief Complaint   Patient presents with   . Drug Overdose     Patient is a 42 y/o male who presents to the ED with likely overdose    Per EMS report:  Last seen at 2000 on 1/3 - was arguing on the phone - found at 6000 this morning by family unresponsive. Unsure what medication he took (noncompliant and many meds found in the house). Empty ETOH bottles on scene (known alcoholism).   Found to be agonal by EMS, put NPA in, NRB on 15L and 6LNC ( was 93% on NRB alone)   Given 0.8mg  of Narcan IV without positive effect.     On arrival, patient awakens to deep sternal rub and loud voice for several seconds. Then goes back to sleep  Seems to be protecting his airway, though is borderline        Tachy and hypoxic in triage        Medical/Surgical/Family History     Past Medical History:   Diagnosis Date   . Alcoholism    . Bipolar disorder    . Hypertension         Patient Active Problem List   Diagnosis Code   . Alcohol dependence with alcohol-induced mood disorder F10.24   . HTN (hypertension) I10   . Salicylate overdose T39.091A   . Suicide attempt T14.91XA   . Male Erectile Disorder F52.8            Past Surgical History:   Procedure Laterality Date   . TONSILLECTOMY     . TYMPANOPLASTY            Social History     Tobacco Use   . Smoking status: Every Day     Packs/day: .5     Types: Cigarettes   Substance Use Topics   . Alcohol use: Yes     Comment: daily   . Drug use: No             Review of Systems   Unable to perform ROS: Patient unresponsive       Physical Exam     Triage Vitals  Triage Start: Start, (06/29/22 0745)  First Recorded BP: (!) 176/110, Resp: 22, Temp: 37.3 C (99.1 F) Oxygen Therapy SpO2: 99 %, O2 Device: O2 Therapy, O2 Therapy: Nasal cannula;Non-Rebreather mask (15LNRB and 6LNC), Heart Rate: (!) 122, (06/29/22 0750)  .      Physical Exam  Vitals and nursing note reviewed.   Constitutional:       Appearance: He is obese. He is ill-appearing and toxic-appearing. He is not  diaphoretic.   HENT:      Head: Normocephalic and atraumatic.      Right Ear: Tympanic membrane and external ear normal.      Left Ear: Tympanic membrane and external ear normal.      Nose: Nose normal.      Mouth/Throat:      Mouth: Mucous membranes are dry.      Pharynx: Oropharynx is clear.   Eyes:      General: No scleral icterus.     Pupils: Pupils are equal, round, and reactive to light.   Cardiovascular:      Rate and Rhythm: Tachycardia present.      Pulses: Normal pulses.   Pulmonary:      Effort: Respiratory distress present.      Breath sounds: No stridor. Rhonchi present. No wheezing or rales.   Abdominal:  General: There is distension.      Tenderness: There is no abdominal tenderness.      Hernia: A hernia is present.   Musculoskeletal:         General: No deformity or signs of injury.      Cervical back: No rigidity.   Lymphadenopathy:      Cervical: No cervical adenopathy.   Skin:     General: Skin is warm and dry.      Capillary Refill: Capillary refill takes more than 3 seconds.      Coloration: Skin is not jaundiced.      Findings: No bruising.   Neurological:      Mental Status: He is disoriented.      GCS: GCS eye subscore is 2. GCS verbal subscore is 3. GCS motor subscore is 5.       Medical Decision Making   Patient seen by me on:  06/29/2022    Assessment:  81 male who presents to the ED as an overdose    Differential diagnosis:  Polypharmacy  Opioid  Etoh  Rhabdo  Infection seems less likely  No rigidity to suggest NMS vs Seratonin but clearly considered    Plan:  CXR  Labs  Ovbg  IVF  Airway monitoring in the bay    EKG Interpretation:  Sinus tachy, no T-waves changes c/w hyperk    Review of existing & external labs / records: CK elevated, K elevated (when finally resulted), WBC elevated    Independent interpretation of imaging: CXR with pulm edema modesly    Consideration of hospitalization: yes, MICU vs floor    ED Course and Disposition:  21 male who presents to the ED as an overdose.  Suspect polypharmacy and unsure exact down time. Initialy OVBG k high, but no EKG changes. However CK and formal K returned (+) as well so patient likely down for prolonged time and in rhabdo. K shifters ordered, lasix ordered. MICU and tox consulted. MICU felt patient could likely be given fluids and repeat CK/K checked to see if he could be floor status. This was ordered. I don't think he needs clear intubation right now but this may change. Patient signe dout pending repeat labs and dispo to floor vis MICU.          ED Course as of 07/09/22 2019   Thu Jun 29, 2022   0808 CXR with pulm edema and fluid in the fissure on R side, trops and BNP ordered   0814 Potassium,WB(!!): 6.9  EKG reviewed. T waves not peaked, no QRS widening. Given that this is whole blood concern this may be falsely elevated due to hemolysis. Will hold on shifters until BMP returns. If (+) will treat   0943 Repeat EKG unchanged  Hemolyzed labs re-sent     1204 CK(!)  Marked elevation, more fluids ordered   1216 Potassium(!!)  Formal K elevated. Hyperk treatment ordered  MICU and tox paged       Ephriam Jenkins, MD          Critical Care    There is a high probability of imminent or life threatening deterioration due to CNS failure or compromise, toxidrome and respiratory failure.    Acute interventions include re-eval of pt's condition, obtaining hx from pt or surrogate, documenting the case, eval pt's response to tx, order & perform tx & interventions, review of old charts, order & review of lab studies, initial hx & physical exam, discussions w/other provider,  IV meds (specify below) and order & review of radiology studies.    IV medications given:  Per MAR    I personally spent 60 cumulative minutes performing critical care interventions to this patient as outlined above. This excludes separately billable procedures.       Author:  Ephriam Jenkins, MD         Ephriam Jenkins, MD  07/09/22 2029

## 2022-06-29 NOTE — H&P (Addendum)
Medical ICU History and Physical Note  LOS: 1    HPI: Jeffrey Reese is a 42 y.o. male with PMH significant for prior SI attempts salicylates, 3-10 tall beers daily, prior alchol withdrawal with no reported seizures who presents with unresponsiveness, empty bottles and beers found on scene. Last well 1/3. Last drink 1/3. Found by EMS agonal put on NRB and given IV Narcan with some improvement.    Found to have rhabdo, got 4L IVF and on continuous Plasmalyte. K 6.8 temporized and removed with Lasix. Making urine. Also received diazepam 10 PO for CIWA>10    ROS:  Review of Systems   Reason unable to perform ROS: encephalopathy.     Medical/Surgical/Family History   PMH:  Past Medical History:   Diagnosis Date    Alcoholism     Bipolar disorder     Hypertension         PSH:  Past Surgical History:   Procedure Laterality Date    TONSILLECTOMY      TYMPANOPLASTY          Medications:  Prior to Admission medications    Medication Sig Start Date End Date Taking? Authorizing Provider   amLODIPine (NORVASC) 10 mg tablet Take 1 tablet (10 mg total) by mouth daily 07/15/20   [provider]   buPROPion (WELLBUTRIN XL) 150 mg 24 hr tablet Take 1 tablet (150 mg total) by mouth daily 10/11/21   [provider]   divalproex (DEPAKOTE ER) 500 mg 24 hr tablet Take 2 tablets (1,000 mg total) by mouth nightly    [provider]   gabapentin 100mg  capsule Take 6 capsules (600 mg total) by mouth 3 times daily 10/27/20   [provider]   lisinopril (PRINIVIL,ZESTRIL) 10 mg tablet Take 1 tablet (10 mg total) by mouth daily 04/16/18   [provider]   meloxicam (MOBIC) 15 mg tablet Take 1 tablet (15 mg total) by mouth daily  Take with food.    [provider]   cyclobenzaprine (FLEXERIL) 10 mg tablet Take 1 tablet (10 mg total) by mouth 3 times daily as needed for Muscle spasms    [provider]   DULoxetine (CYMBALTA) 60 mg DR capsule Take 1 capsule (60 mg total) by mouth daily     [provider]   morphine 15 mg immediate release tablet Take 1 tablet (15 mg total) by mouth every 4 hours as needed for Pain  Max daily dose: 90 mg 04/22/22   Manya Silvas, DO    ALERT CONVERSION COMPLETE  Provider must reconcile medications 10/04/10   Provider, Conversion   epinephrine (EPIPEN 2-PAK) 0.3 MG/0.3ML DEVI USE AS DIRECTED. 12/03/09   Provider, Conversion        Allergies:   Allergies   Allergen Reactions    Zoloft        Family History:  No family history on file.     Social Hx:   Social History     Socioeconomic History    Marital status: Single   Tobacco Use    Smoking status: Every Day     Packs/day: .5     Types: Cigarettes   Substance and Sexual Activity    Alcohol use: Yes     Comment: daily    Drug use: No            Objective  Objective   Vital Signs: BP 121/89 (BP Location: Right arm)   Pulse (!) 112  Temp (!) 38.1 C (100.6 F) (Foley)   Resp 16   Ht 1.829 m (6')   Wt 117.9 kg (260 lb)   SpO2 94%   BMI 35.26 kg/m      Physical Exam  Gen: reclined not interactive but responds to sternal rub   HEENT:facial flushing    Pulmonary: coarse breath sounds b/l   Cardiovascular: pulses present, tachycardic  GI: no rigidity  Extrem: no edema   Neuro: opens eyes to sternal rub  GU: Foley draining yellow  Neuro: no apparent rigidity       Assessment: Jeffrey Reese is 42 y.o. male with PMH significant for up to 8 big beers daily, no reported seizures from withdrawals, prior salicylate SI attempt, here with unresponsivness, related to toxidrome and potentially infectious as well, such as aspiration. MICU admission for increased monitoring needs. Last drink reportedly 1/3 evening.    Plan (by system - relevant labs/imaging will be commented on here):    Pulmonary:    - CXR R>L opacities  -needed NRB now NC, no reported home O2  -if not ventilating himself, will need tube  - Is the patient on mechanical ventilation? No,    - Most recent pulmonary function tests: unknown  -most recent  VBG suggests hyperventilation  -repeat VBG    CV:   - trop intermediate risk 19->24 3hr pathway, demand mediated myocardial injury  - serial EKG consistent  - if new CP, repeat serial trops and serial EKG  -rhythm is sinus tachy regular  -BNP<50    Renal:   - trend CK, Cr, K  -continue 150cc/hr IVF for rhabdo  -check Mg/Ph  -   Intake/Output Summary (Last 24 hours) at 06/30/2022 0000  Last data filed at 06/29/2022 1628  Gross per 24 hour   Intake 2999 ml   Output 1450 ml   Net 1549 ml     - Indwelling urinary catheter?:  Yes;     GI:   - NPO while encephalopathic  -trend mildly elevated LFTs, ratio suggestive of alcohol injury  -can check [place] for signs of cirrhosis or steatosis  - Enteral Nutrition: no  - Indications for GI prophylaxis: no  - Bowel Regimen: No; NPO    ID (Specific known/suspected infections, sources):   - encephalopathy most likely toxidrome  -no apparent nuchal rigidity  -given fever, leukocytosis, and CXR findings, start CAP treatment for coverage of oral flora  -may be pneumonitis, can de-escalate abx  -COVID flu RSV negative  -UA not suggestive of infection    Heme:   - stable H/H  - Chemical DVT Prophylaxis is indicated; Lovenox    Endo:    - TFTs normal  -add on A1c  -hypoglyemic protocol ordered while NPO    Neuro/Psych:   - home meds based on dispense report:  Bupropion 150 NDRI (adrenergic effects in overdose)  Flexeril 10 TID (anticholinergic in overdose)  Divalproex 500 BID (checking valproic acid levels)  Duloxetine 60 (no apparent rigidity although yes fever, tachy)  Gabapentin 600 TID  Naltrexone 50   -holding home psych meds while encephalopathic  -salicylates, ethanol, acetaminophen negative  -q1h CIWA appreciate RN, page before giving benzo  -high dose thiamine  -continue folic acid  -utox pending  - Analgesics: PRN  - Engage PT? : Yes  - Mobility plan: pending PT     Integument: ICU protocol  Lines/Tubes/Drains: Foley, pIV  Medication Reconciliation Completed: Yes based on dispense  report  Code Status: FULL CODE  Family concern/updates: HM updated family  Have PCP & Relevant consultants been notified of admission?: No  Plan of care discussed with: MICU fellow   Diagnoses for this hospitalization include:  Encephalopathy    Lavera Guise PGY2 medicine  Pulmonary/Critical Care Medicine

## 2022-06-29 NOTE — Plan of Care (Signed)
Patient is lethargic, vitals remain stable  Wheezy on exam, could consider aspiration event   Requested stat VBG (other hemolyzed) and we are expediting transfer to critical care bay   MICU has evaluated and will accept patient

## 2022-06-29 NOTE — Progress Notes (Signed)
06/29/22 2252   UM Patient Class Review   Patient Class Review Inpatient     Patient Class effective 06/29/2022.    Truitt Merle RN  [facility] Utilization Management  Emergency Dept [phone]

## 2022-06-29 NOTE — Plan of Care (Addendum)
Called his mother. She believes he drink at least 3 tall boy IPAs daily, was not aware it could be more (pt said 8-10). She was worried about overdose.  Discussed his worsening clinical status, concern not protecting airway. She understands I have consulted ICU to see him for closer monitoring and is appreciative of call.     All plans overnight (labs, diazepam, micu consult) were discussed with attending HM physician

## 2022-06-29 NOTE — Provider Consult (Cosign Needed)
Toxicology H&P/Consult    Requesting Physician: Oretha Caprice, MD   Reason for Consult: Suspected toxic ingestion    HPI: Jeffrey Reese is a 42 y.o. male with PMH significant for HTN, EtOH use disorder, bipolar, and previous suicide attempts (salicylates) who presents with AMS 2/2 suspected polypharmacy overdose.      Per family report and chart review, was last known well the evening of 1/3 ~2000 when he was witnessed having a verbal altercation with a significant other via phone. He was found unresponsive and sonorous at 0600 this morning. Per EMS, several bottles of medications (not specified which in EMS run sheet but reportedly bagged and given to ED MD) and several cans of beer on scene.      Has prescriptions / recent fill histories for bupropion, Depakote, duloxetine, gabapentin, and trazodone.    On our visit the patient is somnolent and upset with his examination initially nearly swinging and the nurse placing a Foley and then after that using curse words to tell us to leave him alone.    Past Medical History:   Diagnosis Date    Alcoholism     Bipolar disorder     Hypertension       Social History     Tobacco Use    Smoking status: Every Day     Packs/day: .5     Types: Cigarettes    Smokeless tobacco: Not on file   Substance Use Topics    Alcohol use: Yes     Comment: daily      No family history on file.     Allergies   Allergen Reactions    Zoloft          Review of Systems:  Review of Systems   Unable to perform ROS: Mental status change       No other pertinent positives or negatives, complete review of systems performed.    Scheduled Meds:    lactated ringers bolus  1,000 mL Intravenous Once     Continuous Infusions:   PRN Meds: sodium chloride, dextrose    I-Stop Record:   A U N O 04/22/2022 04/22/2022 morphine sulfate ir 15 mg tab 6 1 Malachi Bonds ) [id number] Insurance [facility] [phone]       Objective:    Vital signs in last 24 hours:  Temp:  [36.9 C (98.4 F)-37.3 C  (99.1 F)] 37 C (98.6 F)  Heart Rate:  [101-122] 101  Resp:  [17-22] 18  BP: (101-176)/(67-110) 101/67    Physical Exam  Constitutional:       General: He is not in acute distress.     Appearance: He is diaphoretic. He is not toxic-appearing.      Comments: Grossly obtunded, only interactive with certain examination maneuvers   HENT:      Head: Normocephalic.      Mouth/Throat:      Mouth: Mucous membranes are dry.   Eyes:      Pupils: Pupils are equal, round, and reactive to light.   Cardiovascular:      Rate and Rhythm: Regular rhythm. Tachycardia present.      Pulses: Normal pulses.      Heart sounds: Normal heart sounds.   Pulmonary:      Effort: Pulmonary effort is normal.      Breath sounds: Normal breath sounds.   Abdominal:      General: Abdomen is flat.      Palpations: Abdomen is soft.  Comments: Decreased bowel sounds   Genitourinary:     Comments: Approximately 1 L of urine output after Foley placement  Skin:     General: Skin is warm.      Comments: Diaphoresis present, no piloerection   Neurological:      Comments: Mostly somnolent, not interactive with [place] other than some exam maneuvers       ECG Interpretation: Sinus tachycardia  Rate: 119  QTc: 431  QRS: 75       Recent Results (from the past 24 hour(s))   CBC and differential    Collection Time: 06/29/22  8:01 AM   Result Value Ref Range    WBC 17.8 (H) 3.5 - 11.0 THOU/uL    RBC 5.1 4.0 - 6.0 MIL/uL    Hemoglobin 16.3 12.0 - 17.0 g/dL    Hematocrit 46 37 - 52 %    MCV 89 75 - 100 fL    MCH 32 26 - 32 pg    MCHC 36 32 - 37 g/dL    RDW 04.5 0.0 - 40.9 %    Platelets 281 150 - 450 THOU/uL    Seg Neut % 83.8 %    Lymphocyte % 7.5 %    Monocyte % 7.9 %    Eosinophil % 0.1 %    Basophil % 0.2 %    Neut # K/uL 14.9 (H) 1.5 - 6.5 THOU/uL    Lymph # K/uL 1.3 1.0 - 5.0 THOU/uL    Mono # K/uL 1.4 (H) 0.1 - 1.0 THOU/uL    Eos # K/uL 0.0 0.0 - 0.5 THOU/uL    Baso # K/uL 0.0 0.0 - 0.2 THOU/uL    Nucl RBC % 0.0 0.0 - 0.2 /100 WBC    Nucl RBC # K/uL 0.0 0.0 -  0.0 THOU/uL    IMM Granulocytes # 0.1 (H) 0.0 - 0.0 THOU/uL    IMM Granulocytes 0.5 %   Basic metabolic panel    Collection Time: 06/29/22  8:01 AM   Result Value Ref Range    Glucose 147 (H) 60 - 99 mg/dL    Sodium 811 (L) 914 - 145 mmol/L    Potassium CANCELED 3.3 - 5.1 mmol/L    Chloride 94 (L) 96 - 108 mmol/L    CO2 CANCELED 20 - 28 mmol/L    Anion Gap CANCELED 7 - 16    UN 24 (H) 6 - 20 mg/dL    Creatinine CANCELED 0.67 - 1.17 mg/dL    eGFR BY CREAT CANCELED *    Calcium 9.1 9.0 - 10.3 mg/dL   Protime-INR    Collection Time: 06/29/22  8:01 AM   Result Value Ref Range    Protime CANCELED 10.0 - 12.9 sec    INR CANCELED 0.9 - 1.1   APTT    Collection Time: 06/29/22  8:01 AM   Result Value Ref Range    aPTT CANCELED 25.8 - 37.9 sec   RUQ panel (ED only)    Collection Time: 06/29/22  8:01 AM   Result Value Ref Range    Amylase CANCELED 28 - 100 U/L    Lipase 29 13 - 60 U/L    Total Protein CANCELED 6.3 - 7.7 g/dL    Albumin 4.8 3.5 - 5.2 g/dL    Bilirubin,Total CANCELED 0.0 - 1.2 mg/dL    Bili,Indirect see below 0.1 - 1.0 mg/dL    Bilirubin,Direct <7.8 0.0 - 0.3 mg/dL    Alk Phos CANCELED 40 -  130 U/L    AST CANCELED 0 - 50 U/L    ALT CANCELED 0 - 50 U/L   C reactive protein (CRP)    Collection Time: 06/29/22  8:01 AM   Result Value Ref Range    CRP 5 0 - 8 mg/L   TSH    Collection Time: 06/29/22  8:01 AM   Result Value Ref Range    TSH 1.66 0.27 - 4.20 uIU/mL   T4, free    Collection Time: 06/29/22  8:01 AM   Result Value Ref Range    Free T4 1.2 0.9 - 1.7 ng/dL   Salicylate level    Collection Time: 06/29/22  8:01 AM   Result Value Ref Range    Salicylate <3.0 (L) 15.0 - 30.0 mg/dL   Ethanol    Collection Time: 06/29/22  8:01 AM   Result Value Ref Range    Ethanol <10 0 - 9 mg/dL   Acetaminophen level    Collection Time: 06/29/22  8:01 AM   Result Value Ref Range    Acetaminophen CANCELED ug/mL   Venous gases / whole blood panel    Collection Time: 06/29/22  8:01 AM   Result Value Ref Range    Hemoglobin 17.1 (H)  12.0 - 17.0 g/dL    PH,VENOUS 1.61 0.96 - 7.42    PCO2,VENOUS 35 (L) 40 - 50 mm Hg    PO2,VENOUS 73 (H) 25 - 43 mm Hg    Bicarbonate,VENOUS 20 (L) 21 - 28 mmol/L    Base Excess,VENOUS -4 (L) -3 - 2 mmol/L    CO2 (Calc),VENOUS 21 (L) 22 - 31 mmol/L    FO2 HB,VENOUS 91 (H) 63 - 83 %    CO 2.6 %    Methemoglobin 0.8 0.0 - 1.0 %    NA, WB 132 (L) 135 - 145 mmol/L    Potassium,WB 6.9 (HH) 3.3 - 4.6 mmol/L    ICA Uncorr,WB 4.3 (L) 4.8 - 5.2 mg/dL    ICA @7 .4,WB 4.3 (L) 4.8 - 5.2 mg/dL    Lactate VEN,WB 2.9 (H) 0.5 - 2.2 mmol/L    Glucose,WB 136 (H) 60 - 99 mg/dL   CK    Collection Time: 06/29/22  8:01 AM   Result Value Ref Range    CK CANCELED 39 - 308 U/L   NT-pro BNP    Collection Time: 06/29/22  8:01 AM   Result Value Ref Range    NT-pro BNP <50 0 - 450 pg/mL   POCT glucose    Collection Time: 06/29/22  8:02 AM   Result Value Ref Range    Glucose POCT 139 (H) 60 - 99 mg/dL   ECG - Adult ED ([place])    Collection Time: 06/29/22  8:07 AM   Result Value Ref Range    Rate 119 bpm    PR 157 ms    P 1 deg    QRSD 92 ms    QT 306 ms    QTc 431 ms    QRS 75 deg    T 95 deg   Blood bank hold pink    Collection Time: 06/29/22  8:09 AM   Result Value Ref Range    Blood Bank Hold Pink Pink in Bld Bnk    Troponin T 0 HR High Sensitivity    Collection Time: 06/29/22  8:10 AM   Result Value Ref Range    TROP T 0 HR High Sensitivity CANCELED 0 - 11 ng/L  Hold gray    Collection Time: 06/29/22  8:14 AM   Result Value Ref Range    Hold Grey HOLD TUBE    RUQ panel (ED only)    Collection Time: 06/29/22  9:35 AM   Result Value Ref Range    Amylase 62 28 - 100 U/L    Lipase 16 13 - 60 U/L    Total Protein 6.6 6.3 - 7.7 g/dL    Albumin 4.4 3.5 - 5.2 g/dL    Bilirubin,Total 0.8 0.0 - 1.2 mg/dL    Bili,Indirect see below 0.1 - 1.0 mg/dL    Bilirubin,Direct <1.6 0.0 - 0.3 mg/dL    Alk Phos 86 40 - 109 U/L    AST CANCELED 0 - 50 U/L    ALT 101 (H) 0 - 50 U/L   Basic metabolic panel    Collection Time: 06/29/22  9:35 AM   Result Value Ref  Range    Glucose 132 (H) 60 - 99 mg/dL    Sodium 604 (L) 540 - 145 mmol/L    Potassium CANCELED 3.3 - 5.1 mmol/L    Chloride 99 96 - 108 mmol/L    CO2 18 (L) 20 - 28 mmol/L    Anion Gap 15 7 - 16    UN 23 (H) 6 - 20 mg/dL    Creatinine 9.81 (H) 0.67 - 1.17 mg/dL    eGFR BY CREAT 56 (!) *    Calcium 9.3 9.0 - 10.3 mg/dL     *Note: Due to a large number of results and/or encounters for the requested time period, some results have not been displayed. A complete set of results can be found in Results Review.     Assessment/Plan:    Jeffrey Reese is a 42 y.o. male who presents with encephalopathy and rhabdomyolysis after likely being down at home and found this morning with a last known normal last evening. The patient's acute toxicological problems are: opiod dependence and withdrawl, suspected toxic effect from ingested substance, altered mental status, tachycardia, and toxic encephalopathy and need for critical supportive care.  The patient does not have a clear toxidrome as he is with features of multiple possibilities and with a number of medications that he could have ingested.  On top of this he has a history of ethanol abuse and there was a possibility of a seizure at home that precipitated his current rhabdomyolysis, or there was some increased downtime at home.  He is with some anticholinergic features at this time with a dry mouth, tachycardia, and urinary retention which could be related to sedative-hypnotic use (cyclobenzaprine from dispense reports).  At this time he is protecting his airway and does not have clear signs of withdrawal either,  but warrants admission to the critical care setting for close monitoring and supportive care.    For the above problems, the recommendations are as follows:  -Continue supportive care in the critical care setting  - No role at this time for acute intervention like physostigmine  - Follow urine drug panel and obtain more  history as patient awakens  - Treatment of  rhabdomyolysis per primary team    Patient was seen by me 06/29/2022.    Contact the Toxicology Attending through [employer] or the [employer].    Gae Dry, MD  06/29/2022 3:09 PM    Medical Toxicology Faculty Note Dr. Lincoln Brigham Attending:    Patient independently seen and examined and I have reviewed the above note and  agree with the history, exam, assessment and plan. All data and labs reviewed.    This patient presented the morning after an argument with a significant other who presented with an encephalopathy consistent with a polysubstance ingestion.  We recommend supportive care only as there is no clearly definable isolated toxic syndrome.      On 06/29/22 at 1400 I evaluated the patient on unit floor, reviewed test results, records, and provided recommendations and documented my findings.

## 2022-06-29 NOTE — H&P (Addendum)
Medical ICU Consult Note     Jeffrey Reese is a 42 y.o. male with PMH significant for HTN, EtOH use disorder, bipolar, and previous suicide attempts (salicylates) who presents with AMS 2/2 suspected polypharmacy overdose.     Per family report, was last known well the evening of 1/3 ~2000 when he was witnessed having a verbal altercation with a significant other via phone. He was found unresponsive and sonorous at 0600 this morning. Per EMS, several bottles of medications (not specified which in EMS run sheet but reportedly bagged and given to ED MD) and several cans of beer on scene.     Has prescriptions / recent fill histories for bupropion, Depakote, duloxetine, gabapentin, and trazodone. Toxicology has been consulted.     Given history of suicidality, is under psychiatric hold until intent can be determined.     ROS  Patient unable to participate     Objective  Objective   Vital Signs: BP 111/78   Pulse (!) 112   Temp 37.3 C (99.1 F)   Resp 18   Ht 1.829 m (6')   Wt 117.9 kg (260 lb)   SpO2 (S) 94%   BMI 35.26 kg/m      Physical Exam  General: Young man lying in stretcher   Neuro: Obtunded, arouses with vigorous painful stimuli, becomes verbally aggressive, does follow simple commands   HEENT: NCAT, PERRL, mucous membranes dry   Pulm: Sonorous, CTA   CV: S1S2, rapid rate, regular rhythm   GI: Distended   GU: Foley in place, draining yellow urine    Ext: Palpable pulses, no edema    Integument: Flushed, WWP          Assessment: Jeffrey Reese is 42 y.o. male with PMH significant for HTN, EtOH use disorder, bipolar, and previous suicide attempts (salicylates) who presents with AMS 2/2 suspected polypharmacy overdose.    Recommendations:     Neuro/Psych: #polypharmacy overdose #etoh  - Extended drug screen panel (ordered)   - High dose thiamine and folic acid   - Follow toxicology recommendations     Pulmonary/CV:   - Protecting airway on 6L NC at this time with probable underlying OSA, also daily tobacco  use    - Low threshold for intubation for worsening respiratory status   - Tachycardic, normotensive   - Trops a-dynamic   - Tele, VS q1h, continuous pulse oximetry     Renal: #Rhabdomyolysis #Hyperkalemia   - CK > 20,000   - K 6.8 but without EKG changes and now s/p temporization with Ca, Insulin/D50, Lasix at 1215    - Aggressive fluid resuscitation, receiving 2nd liter LR now   - Plan on 3rd liter LR   - Recheck BMP/CK/lactate an hour after temporization and 3rd liter     Intake/Output Summary (Last 24 hours) at 06/29/2022 1236  Last data filed at 06/29/2022 1610  Gross per 24 hour   Intake 1000 ml   Output --   Net 1000 ml     GI:   - NPO   - Repeat LFTs     ID:   - Consider risk for aspiration with unknown down time/AMS   - Low threshold for infectious work-up   - Low threshold for empiric abx     Heme:   - LMWH     In summary, recommend 30 mL/kg fluid resuscitation, repeat BMP/CK/lactate in 1-2 hours. Monitor airway. If condition worsens, low threshold for intubation.     Plan of care  discussed with: ICU Fellow (Shiv Allena Katz)      Bradley Ferris, NP   Pulmonary/Critical Care Medicine    I spent 40 cumulative minutes, independent of procedures, in attention to this critically ill patient who has 1 or more organ system failure and a high probability of deterioration. Diagnoses include: rhabdomyolysis, drug overdose

## 2022-06-29 NOTE — Plan of Care (Addendum)
Requested to see patient for tachycardia and diaphoresis    On exam, patient is oriented to self and place. Poor recollection of todays events. Denies shortness of breath, chest pain, discomfort. Wants to sleep. Says that he typically has 8-10 alcoholic drink per day. Last drink was "yesterday"  He seems confused and disoriented when asked more complex questions.     On exam he is tachycardic, sinus rhythm, Qtc is not prolonged   He does not have increased wob but is generalized flush and face is diaphoretic  Patient is febrile to 100.6   CIWA >10, pt denies hx seizures, pt able to take PO currently, begin prn diazepam 10 mg for ciwa >10  Bladder scan to assess for retention in setting of possible overdose  Patient is at risk for aspiration, begin aspiration precautions and elevate HOB     Obtain lactate, cbc, cmp, blood cultures, repeat CK, mag, phos, Vbg now     Endorsing congestion and has oxygen requirement, obtain covid/flu/rsv    If he were to further decompensate, will re-involve MICU for closer monitoring    Addendum: request repeat EKG to re-assess inferior leads. PT denies chest pain, trops are delta of 5. If repeat EKG has continued changes to inferior leads from prior, will obtain repeat troponin

## 2022-06-29 NOTE — ED Triage Notes (Signed)
Last seen at 2000 on 1/3 - was arguing on the phone - found at 6000 this morning by family unresponsive. Unsure what medication he took (noncompliant and many meds found in the house). Empty ETOH bottles on scene (known alcoholism).   Found to be agonal by EMS, put NPA in, NRB on 15L and 6LNC ( was 93% on NRB alone)   Given 0.8mg  of Narcan IV without positive effect.   Multiple SI attempts in the past     Prehospital medications given: Yes  Reversal Medications: Narcan (.8mg  - IV)

## 2022-06-29 NOTE — Plan of Care (Signed)
HMD Attending Plan of Care Note    Mr. Demski has continued to worsen since admission to [facility].   I reassessed him multiple times since 1730 and he now has a fever, facial flushing, and waxing and waning mental status. When assessed at 2240 his GCS was less than 10 and he was on 4L O2 with SpO2 of 96%.     Repeat EKG: sinus tachycardia, no QRS widening, QTc <450    Physical exam notable for:  Facial flushing, diaphoresis, appears ill  Lungs with diminished breath sounds in both bases  Tachycardic  Somnolent, not opening eyes to sternal rub  No clonus on my exam, though difficult to elicit with patient positioning  Foley draining light yellow urine    Overall, I am concerned that Mr. Rathman will continue to decline and may not protect his airway or develop progressive vital sign instability. Differential remains broad and includes toxidrome (serotonergic toxidrome, anticholingeric toxidrome, bupropion overdose, complicated EtOH withdrawal), but also infectious (aspiration pneumonitis/PNA, less likely meningitis/encephalitis).     We will re-engage MICU. Discussed with Dr. Jettie Booze, R3 and the ED RN team.     I spent 30 cumulative minutes, in full attention to this critically ill patient, who has a high probability of life threatening deterioration in their clinical condition due to progressive encephalopathy.  I provided close monitoring and interventions as described in my assessment and plan.    Gibson Ramp, MD  Hospital Medicine Attending  06/29/22 11:09 PM

## 2022-06-29 NOTE — H&P (Addendum)
Hospital Medicine Admission Note    Full Code    Chief Complaint   Rhabdomyolysis/ AMS    History of Present Illness   Jeffrey Reese is a 42 y.o. male with PMH significant for HTN, EtOH use disorder, bipolar, and previous suicide attempts (salicylates) who presents with AMS 2/2 suspected polypharmacy overdose and rhabdomyolysis.    Per chart review and family collateral:  Patient was last seen well evening 1/3 around 8:00 when he was on the phone having an argument with his significant other.  At that point his mom went to sleep.  Mom reports she woke up in the middle the night and saw patient sleeping in the chair, but did not think anything of it and went back to bed.  In the morning mom reports she went downstairs around 6/6:30 in the morning and she saw that patient was still asleep in the chair.  Typically she reports that he wakes up early and is ready for work at this time.  She reports she tried to wake him up and was unable to.  At this time she did call EMS.  EMS bagged medications onsite and reports several cans of beer as well.  Per report patient was agonal EMS placed on nonrebreather and given Narcan IV without positive effect.     On our initial visit the patient is somnolent. GCS 11-12, on reevaluation mental status is improved.He doesn't remember much from yesterday, was drinking but doesn't recall taking extra meds or substances       Has prescriptions / recent fill histories for bupropion, Depakote, duloxetine, gabapentin, and trazodone.  Of current scripts has:  15 bupropion left   -dispensed for 30 days on 12/11  24 flexeril    -dispensed 11/22 90 tablets   2 cymbalta    -dispensed for 30 days on 12/11   0 lisinopril    -dispensed 04/02/21  0 morphine    -1 pill dispensed 04/22/22  Meloxicam and gabapentin are sealed bottles   Remainder of medications with too many pills to count     ED Course:  Upon presentation, patient appeared somnolent. Vital signs notable for hypoxia, tachycardia. Labs  notable for CK 15, 359, lactate 2.8, potassium 6.8 --> 4.7. MICU and toxicology consulted. MICU recommend 30 mL/kg fluid resuscitation, repeat BMP/CK/lactate in 1-2 hours. Monitor airway. Tox recommended supportive care and treatment of rhabdo.     Review of Systems   Review of Systems   Unable to perform ROS: Medical condition       Past Medical and Surgical History     Past Medical History:   Diagnosis Date    Alcoholism     Bipolar disorder     Hypertension      Past Surgical History:   Procedure Laterality Date    TONSILLECTOMY      TYMPANOPLASTY         Medications and Allergies   (Not in a hospital admission)    He is allergic to zoloft.    Social and Family History   No family history on file.  Social History     Socioeconomic History    Marital status: Single   Tobacco Use    Smoking status: Every Day     Packs/day: .5     Types: Cigarettes   Substance and Sexual Activity    Alcohol use: Yes     Comment: daily    Drug use: No         Vitals  and Physical Exam   Temp:  [36.4 C (97.5 F)-37.3 C (99.1 F)] 36.4 C (97.5 F)  Heart Rate:  [101-122] 102  Resp:  [14-22] 19  BP: (96-176)/(56-110) 113/79  General: lethargic, but will awaken to answer questions, on 6L NC  HEENT: PERRL, extraocular movements intact, oropharynx benign  Neck: No LAD, No JVD  CV: RRR, no murmurs  Pulm: Normal work of breathing, CTA, No crackles, wheezing, rhonchi  Abdomen: Soft, non-distended, non-tender, no rebound tenderness, bowel sounds active  Extremities: Warm, 2+ peripheral pulses, no edema, no cyanosis  Skin: No rashes or lesions on exposed skin   Neuro: lethargic, face symmetric,  moving all extremities  Laboratory Data     Electrolytes: Blood   Recent Labs   Lab 06/29/22  1445 06/29/22  1321 06/29/22  1106 06/29/22  0935 06/29/22  0801   Sodium 135  --   --  132* 127*   Potassium 4.7 4.8 6.8* CANCELED CANCELED   CO2 23  --   --  18* CANCELED   UN 22*  --   --  23* 24*   Creatinine 1.44*  --   --  1.57* CANCELED   Glucose 119*   --   --  132* 147*   Calcium 9.2  --   --  9.3 9.1   No results for input(s): "MG" in the last 168 hours. Recent Labs   Lab 06/29/22  1322 06/29/22  1002 06/29/22  0801   WBC  --   --  17.8*   Hemoglobin 16.4 16.9 16.3  17.1*   Hematocrit  --   --  46   Platelets  --   --  281   Seg Neut %  --   --  83.8   Lymphocyte %  --   --  7.5   Monocyte %  --   --  7.9   Eosinophil %  --   --  0.1      Recent Labs   Lab 06/29/22  0801   INR CANCELED   aPTT CANCELED   Protime CANCELED        Imaging/Additional Studies   CXR:   Lower lobe opacities can represent atelectasis, right greater than left. Superimposed aspiration or pneumonia in right lower lung cannot be excluded.      Mildly enlarged cardiac silhouette.     CT head:      No acute intracranial abnormality.       Impression and Plan   Jeffrey Reese is a 42 y.o. male with PMH significant for HTN, EtOH use disorder, bipolar, and previous suicide attempts (salicylates) who presents with AMS 2/2 suspected polypharmacy overdose, found to have rhabdomyolysis. Unable to determine what medications may have been ingested as it appears patient is taking medications inconsistently. Patient denies any medication ingestion. Will admit patient to hospital medicine for further workup and management of altered mental status and rhabdomyolysis.     #?polypharmacy overdose   #etoh use  - Extended drug screen panel   - would call psych when patient able to better participate   - Thiamine and folic acid   - Toxicology consulted, appreciate recs   - Protecting airway on 6L NC at this time with probable underlying OSA, also daily tobacco use    - Low threshold for MICU/ intubation for worsening respiratory status   - Tele, VS q4h  - CIWAs  - would use benzodiazepines first line for agitation    #rhabdomyolysis   #  hyperkalemia   - CK > 20,000   - K 6.8 d now s/p temporization with Ca, Insulin/D50, Lasix at 1215    - Aggressive fluid resuscitation, now s/p 30cc/kg fluids  - Recheck BMP/CK  in AM  - plasmalyte 150 cc/hr continuous   - continue tele     #HTN  -hold antihypertensives    If worsens/ if evolving symptoms or escalating behaviors, low threshold to re-engage MICU    DVT Prophylaxis: lovenox  Diet: clears   Code status: Full Code     Disposition: Pending above    Caryn Section, MD    Internal Medicine/Pediatrics   Pager: [phone]

## 2022-06-29 NOTE — ED Notes (Signed)
Writer went to pt's bedside to update vitals and administer ordered med.  Upon assessment, writer noticed pt to be diaphoretic and tachy.  Oral temp 99.9.  Pt does not endorse any chest pain or shortness of breath.  Pt states that he does drink 8-10 drinks per day.  Denies hx of seizures from withdrawals.  Pt does have hand tremors.  Denies tingling or seeing anything.  Denies headache.  Pt is hearing something's but does not explain what he is hearing.  RN asked to do CIWA.  Writer called covering provider.  Covering provider now at bedside.

## 2022-06-30 ENCOUNTER — Inpatient Hospital Stay: Payer: Non-veteran care | Admitting: Radiology

## 2022-06-30 ENCOUNTER — Inpatient Hospital Stay: Payer: Non-veteran care

## 2022-06-30 DIAGNOSIS — K760 Fatty (change of) liver, not elsewhere classified: Secondary | ICD-10-CM

## 2022-06-30 DIAGNOSIS — M7989 Other specified soft tissue disorders: Secondary | ICD-10-CM

## 2022-06-30 DIAGNOSIS — F39 Unspecified mood [affective] disorder: Secondary | ICD-10-CM

## 2022-06-30 DIAGNOSIS — F1090 Alcohol use, unspecified, uncomplicated: Secondary | ICD-10-CM

## 2022-06-30 LAB — BASIC METABOLIC PANEL
Anion Gap: 12 (ref 7–16)
CO2: 23 mmol/L (ref 20–28)
Calcium: 8.7 mg/dL — ABNORMAL LOW (ref 9.0–10.3)
Chloride: 102 mmol/L (ref 96–108)
Creatinine: 1.11 mg/dL (ref 0.67–1.17)
Glucose: 104 mg/dL — ABNORMAL HIGH (ref 60–99)
Lab: 19 mg/dL (ref 6–20)
Potassium: 4.7 mmol/L (ref 3.3–5.1)
Sodium: 137 mmol/L (ref 133–145)
eGFR BY CREAT: 85 *

## 2022-06-30 LAB — CBC AND DIFFERENTIAL
Baso # K/uL: 0 10*3/uL (ref 0.0–0.2)
Baso # K/uL: 0.1 10*3/uL (ref 0.0–0.2)
Basophil %: 0.2 %
Basophil %: 0.6 %
Eos # K/uL: 0.1 10*3/uL (ref 0.0–0.5)
Eos # K/uL: 0.2 10*3/uL (ref 0.0–0.5)
Eosinophil %: 0.5 %
Eosinophil %: 1.7 %
Hematocrit: 44 % (ref 37–52)
Hematocrit: 40 % (ref 37–52)
Hemoglobin: 14.9 g/dL (ref 12.0–17.0)
Hemoglobin: 13.3 g/dL (ref 12.0–17.0)
IMM Granulocytes #: 0.1 10*3/uL — ABNORMAL HIGH (ref 0.0–0.0)
IMM Granulocytes #: 0 10*3/uL (ref 0.0–0.0)
IMM Granulocytes: 0.4 %
IMM Granulocytes: 0.3 %
Lymph # K/uL: 2.7 10*3/uL (ref 1.0–5.0)
Lymph # K/uL: 2.6 10*3/uL (ref 1.0–5.0)
Lymphocyte %: 22.2 %
Lymphocyte %: 29.5 %
MCH: 32 pg (ref 26–32)
MCH: 31 pg (ref 26–32)
MCHC: 34 g/dL (ref 32–37)
MCHC: 33 g/dL (ref 32–37)
MCV: 93 fL (ref 75–100)
MCV: 94 fL (ref 75–100)
Mono # K/uL: 1.2 10*3/uL — ABNORMAL HIGH (ref 0.1–1.0)
Mono # K/uL: 0.9 10*3/uL (ref 0.1–1.0)
Monocyte %: 9.9 %
Monocyte %: 9.9 %
Neut # K/uL: 8.1 10*3/uL — ABNORMAL HIGH (ref 1.5–6.5)
Neut # K/uL: 5.1 10*3/uL (ref 1.5–6.5)
Nucl RBC # K/uL: 0 10*3/uL (ref 0.0–0.0)
Nucl RBC # K/uL: 0 10*3/uL (ref 0.0–0.0)
Nucl RBC %: 0 /100 WBC (ref 0.0–0.2)
Nucl RBC %: 0 /100 WBC (ref 0.0–0.2)
Platelets: 275 10*3/uL (ref 150–450)
Platelets: 220 10*3/uL (ref 150–450)
RBC: 4.7 MIL/uL (ref 4.0–6.0)
RBC: 4.3 MIL/uL (ref 4.0–6.0)
RDW: 13.1 % (ref 0.0–15.0)
RDW: 13.2 % (ref 0.0–15.0)
Seg Neut %: 66.8 %
Seg Neut %: 58 %
WBC: 12.1 10*3/uL — ABNORMAL HIGH (ref 3.5–11.0)
WBC: 8.9 10*3/uL (ref 3.5–11.0)

## 2022-06-30 LAB — VENOUS GASES / WHOLE BLOOD PANEL
Base Excess,VENOUS: 1 mmol/L (ref <=2)
Bicarbonate,VENOUS: 25 mmol/L (ref 21–28)
CO2 (Calc),VENOUS: 26 mmol/L (ref 22–31)
CO: 0.3 %
FO2 HB,VENOUS: 89 % — ABNORMAL HIGH (ref 63–83)
Glucose,WB: 103 mg/dL — ABNORMAL HIGH (ref 60–99)
Hemoglobin: 15.3 g/dL (ref 12.0–17.0)
ICA @7.4,WB: 4.7 mg/dL — ABNORMAL LOW (ref 4.8–5.2)
ICA Uncorr,WB: 4.6 mg/dL — ABNORMAL LOW (ref 4.8–5.2)
Lactate VEN,WB: 1.1 mmol/L (ref 0.5–2.2)
Methemoglobin: 0.3 % (ref 0.0–1.0)
NA, WB: 138 mmol/L (ref 135–145)
PCO2,VENOUS: 39 mm Hg — ABNORMAL LOW (ref 40–50)
PH,VENOUS: 7.42 (ref 7.32–7.42)
PO2,VENOUS: 60 mm Hg — ABNORMAL HIGH (ref 25–43)
Potassium,WB: 4.3 mmol/L (ref 3.3–4.6)

## 2022-06-30 LAB — HEPATIC FUNCTION PANEL
ALT: 123 U/L — ABNORMAL HIGH (ref 0–50)
AST: 384 U/L — ABNORMAL HIGH (ref 0–50)
Albumin: 3.4 g/dL — ABNORMAL LOW (ref 3.5–5.2)
Alk Phos: 71 U/L (ref 40–130)
Bilirubin,Direct: <0.2 mg/dL (ref 0.0–0.3)
Bilirubin,Total: 0.7 mg/dL (ref 0.0–1.2)
Total Protein: 5.8 g/dL — ABNORMAL LOW (ref 6.3–7.7)

## 2022-06-30 LAB — DRUG SCREEN CHEMICAL DEPENDENCY, URINE
Amphetamine,UR: NEGATIVE
Benzodiazepinen,UR: POSITIVE
Cocaine/Metab,UR: NEGATIVE
Fentanyl, UR: NEGATIVE ng/mL
Opiates,UR: POSITIVE
THC Metabolite,UR: NEGATIVE

## 2022-06-30 LAB — HEMOGLOBIN A1C: Hemoglobin A1C: 5.6 %

## 2022-06-30 LAB — VALPROIC ACID LEVEL, TOTAL: Valproic Acid: 75 ug/mL (ref 50–100)

## 2022-06-30 LAB — CK: CK: 12248 U/L — ABNORMAL HIGH (ref 39–308)

## 2022-06-30 LAB — POCT GLUCOSE: Glucose POCT: 101 mg/dL — ABNORMAL HIGH (ref 60–99)

## 2022-06-30 MED ORDER — FOLIC ACID 5 MG/ML IJ SOLN *I*
1.0000 mg | Freq: Every day | INTRAMUSCULAR | Status: DC
Start: 2022-06-30 — End: 2022-07-02
  Administered 2022-06-30 – 2022-07-01 (×2): 1 mg via INTRAVENOUS
  Filled 2022-06-30 (×5): qty 0.2

## 2022-06-30 MED ORDER — DEXTROSE 50 % IV SOLN *I*
25.0000 g | INTRAVENOUS | Status: DC | PRN
Start: 2022-06-30 — End: 2022-06-30

## 2022-06-30 MED ORDER — CEFTRIAXONE SODIUM 1 G IN STERILE WATER 10ML SYRINGE *I*
1000.0000 mg | INTRAVENOUS | Status: AC
Start: 2022-06-30 — End: 2022-07-01
  Administered 2022-06-30 – 2022-07-01 (×3): 1000 mg via INTRAVENOUS
  Filled 2022-06-30 (×2): qty 10
  Filled 2022-06-30: qty 1000

## 2022-06-30 MED ORDER — NICOTINE 14 MG/24HR TD PT24 *I*
1.0000 | MEDICATED_PATCH | Freq: Every day | TRANSDERMAL | Status: DC
Start: 2022-06-30 — End: 2022-07-03
  Administered 2022-06-30 – 2022-07-03 (×4): 1 via TRANSDERMAL
  Filled 2022-06-30 (×5): qty 1

## 2022-06-30 MED ORDER — THIAMINE HCL 100 MG/ML IJ SOLN *I*
500.0000 mg | Freq: Every day | INTRAMUSCULAR | Status: DC
Start: 2022-06-30 — End: 2022-07-02
  Administered 2022-06-30 – 2022-07-02 (×3): 500 mg via INTRAVENOUS
  Filled 2022-06-30 (×2): qty 6
  Filled 2022-06-30: qty 2

## 2022-06-30 MED ORDER — GLUCOSE 15 GM/32ML PO GEL *I*
15.0000 g | ORAL | Status: DC | PRN
Start: 2022-06-30 — End: 2022-06-30

## 2022-06-30 MED ORDER — DOXYCYCLINE 100 MG / 110 ML NS *I*
100.0000 mg | Freq: Two times a day (BID) | INTRAVENOUS | Status: DC
Start: 2022-06-30 — End: 2022-07-02
  Administered 2022-06-30 – 2022-07-02 (×6): 100 mg via INTRAVENOUS
  Filled 2022-06-30 (×8): qty 100

## 2022-06-30 MED ORDER — ACETAMINOPHEN 500 MG PO TABS *I*
1000.0000 mg | ORAL_TABLET | Freq: Three times a day (TID) | ORAL | Status: DC
Start: 2022-06-30 — End: 2022-07-03
  Administered 2022-06-30 – 2022-07-03 (×10): 1000 mg via ORAL
  Filled 2022-06-30 (×10): qty 2

## 2022-06-30 MED ORDER — LIDOCAINE 4 % EX PATCH *I*
1.0000 | MEDICATED_PATCH | CUTANEOUS | Status: DC
Start: 2022-06-30 — End: 2022-07-03
  Filled 2022-06-30 (×5): qty 1

## 2022-06-30 MED ORDER — GLUCAGON HCL (RDNA) 1 MG IJ SOLR *WRAPPED*
1.0000 mg | INTRAMUSCULAR | Status: DC | PRN
Start: 2022-06-30 — End: 2022-07-03

## 2022-06-30 MED ORDER — DEXTROSE 50 % IV SOLN *I*
25.0000 g | INTRAVENOUS | Status: DC | PRN
Start: 2022-06-30 — End: 2022-07-03

## 2022-06-30 MED ORDER — PLASMA-LYTE IV SOLN *WRAPPED*
100.0000 mL/h | Status: DC
Start: 2022-06-30 — End: 2022-07-01
  Administered 2022-06-30 – 2022-07-01 (×4): 100 mL/h via INTRAVENOUS

## 2022-06-30 MED ORDER — JUICE (FOR HYPOGLYCEMIA) *I*
120.0000 mL | ORAL | Status: DC | PRN
Start: 2022-06-30 — End: 2022-07-03

## 2022-06-30 MED ORDER — JUICE (FOR HYPOGLYCEMIA) *I*
120.0000 mL | ORAL | Status: DC | PRN
Start: 2022-06-30 — End: 2022-06-30

## 2022-06-30 MED ORDER — GLUCOSE 15 GM/32ML PO GEL *I*
15.0000 g | ORAL | Status: DC | PRN
Start: 2022-06-30 — End: 2022-07-03

## 2022-06-30 MED ORDER — PLASMA-LYTE IV SOLN *WRAPPED*
150.0000 mL/h | Status: DC
Start: 2022-06-30 — End: 2022-06-30

## 2022-06-30 MED ORDER — DEXTROSE 5 % IV SOLN WRAPPED *I*
1000.0000 mg | INTRAVENOUS | Status: DC
Start: 2022-06-30 — End: 2022-06-30
  Filled 2022-06-30: qty 10

## 2022-06-30 MED ORDER — GLUCAGON HCL (RDNA) 1 MG IJ SOLR *WRAPPED*
1.0000 mg | INTRAMUSCULAR | Status: DC | PRN
Start: 2022-06-30 — End: 2022-06-30

## 2022-06-30 NOTE — ED Notes (Signed)
Report Given To  Trisha. RN      Descriptive Sentence / Reason for Admission   Last seen at 2000 on 1/3 - was arguing on the phone - found at 6000 this morning by family unresponsive. Unsure what medication he took (noncompliant and many meds found in the house). Empty ETOH bottles on scene (known alcoholism).   Found to be agonal by EMS, put NPA in, NRB on 15L and 6LNC ( was 93% on NRB alone)   Given 0.8mg  of Narcan IV without positive effect      Active Issues / Relevant Events   FULL Code  A&Ox3-->lethargic   4L NC  CK  14313-->12248  COVID negative       To Do List  VS/A  Meds per [facility]  US abdomen   Plasmalyte 183ml/hr      Anticipatory Guidance / Discharge Planning  Admit MICU

## 2022-06-30 NOTE — Comprehensive Assessment (Signed)
Adult Social Work Initial Assessment    Jeffrey Reese is a 42 yr old male who was admitted on 06/29/22 from home. Writer met with patient bedside to introduce self, explain role, complete psychosocial assessment and address any questions or concerns he may have.     Patient states he lives with his mother Steward Drone and a friend in a 2 story home with 3 STE and 16 steps to the second floor where patients bedroom and full bathroom are located. Patient states there is a first floor set up if needed where a half bath is located. Patient states he is typically independent at baseline with all ADL's and iADL's using a walker when ambulating occasionally within the home and a scooter in the community for longer distances. Patient states he works full time and gets a ride from his friend as they work together since patient doesn't drive.     Patient denies drug use however endorses drinking 8 - 12 beers a day. Patient states he most recently did outpatient cd tx through the [place] which ended in April 2023. Patient states he had a hard time balancing work and groups which were twice a week. Patient states he sees a psychiatrist Dr. Arley Phenix through [place] as well. Patient states [facility] and declines any other CD information at this time as he plans to go through the [place]. Patient states he will have a ride at time of discharge. Writer provided patient with a general admission letter for  his employer per his request. SW will continue to provide support and assist in discharge planning as needed.      Demographics:  [place] of Residence: [place]  Marital status: Single  Ethnicity/Race: Caucasian  Primary Language: English  Insurance Information: [facility] Astr[facility] of?: No one    Risk Factors:  Risk Factors: Age related issues, Substance abuse    Advance Directive:  *Has patient (or family) completed any of the following? (select all that apply): Health  Care Proxy (HCP)  HCP Name: Tiernan Saade  HCP Phone Number: [phone]  HCP available for inclusion in the chart?: Yes  *Would they like to discuss any issues related to MOLST, DNR Order, HCP, Living Will, or POA?: No  *Health Care Directive teaching done: No    Personal Contacts/Support System:  PIN: [id number]  Spokesperson Name: Kit Deluca  Relationship to Patient : Parent  Phone Number(s): [phone]  Has Spokesperson been notified of admission?: Yes  Alternate Spokesperson?: No  Emergency Contact other than spokesperson?: No  Is spokesperson the patient's caregiver after discharge?: Yes       Professional Contacts/Support System:     PCP=Primary Care Physician Name: Dr. Katheran Awe   PCP=Primary Care Physician Number: [phone]       Living Situation:  Lives With: Family  Can they assist patient after discharge?: Yes  Primary Care Taker of?: No one    Home Geography:  Type of Home: 2 Story home  # Of Steps In Home: 16  # Steps to Enter Home: 3  Bedroom: Second floor  Bathroom: Second floor - full, First floor - half  Utilitites Working: Yes    Psychosocial:  Person assessed: Patient  Coping Status: Appropriate to Stressor  Current Goal of Care: Life prolonging    Alcohol Assessment:  > 0 ETOH drinks in past month: Yes  Demographic: Male <66  Drinks per Week:  (56 -  84 beers)  Drinks per Day:  (8-12 beers)  Resources and Intervention Provided: Not Indicated (patient following up through [place] for CD services)    Substance Abuse (Not including alcohol):  Other Substance Abuse: No  No         Baseline ADL functioning:  Transfers: Independent  Ambulation: Independent  Assistive Device: Walker  Bathing/Grooming: Independent  Meal Prep: Independent  Able to feed self?: Yes  Household maintenance/chores: Independent  Able to drive?: No    Dialysis:  Does Patient have Dialysis?: No    Income Information:  Vocational: Full time employment  Income Situation: Patient Employment (wages)  Community education officer Information: [employer]  Prescription Coverage: and has  [facility] [address]  Served in [place] [employer]: Yes  Is patient OPWDD connected? : No  Is the patient presumed eligible for OPWDD services?: No    Home Care Services:  Do you currently have home care services?: No  Current Home Equipment: Walker (rolling), Cane (straight) (& scooter)     Home Oxygen:  Do you have home oxygen?: No       [place] [employer], LMSW  Phone: [phone]

## 2022-06-30 NOTE — Provider Consult (Signed)
Inpatient Psychiatric Consultation Note        Consult Requested by: medicine    Consult Question: overdose, ?suicide attempt    Admitting Diagnosis: polypharmacy overdose     History of Present Illness:    Jeffrey Reese is a 42 y.o. male with history of HTN, alcohol use disorder, and possible depression vs bipolar disorder with previous suicide attempts via overdose (salicylates in 2012) who was found unresponsive and admitted for suspected polysubstance overdose. Psychiatry is consulted for safety evaluation.     History obtained from patient, chart review, and collateral with mother Jeffrey Reese).    Patient is a limited historian about the events leading up to his admission. He recalls returning from a vacation to visit friends in [place] on Tuesday, going to work on Wednesday, and then does not recall further events. He does not endorse worsened depression or suicidal ideation leading up to these events. He feels that his sleep and appetite have been usual. He denies current suicidal ideation, but does not identify a reason to keep living. Unable to anticipate if he would feel suicidal if he were to leave the hospital ("I can't predict that"). Does not believe he would do anything to harm himself while in the hospital.    Despite multiple inpatient and outpatient CD treatment course, he has been drinking 8-12 beers a day for the past year. He does not believe his drinking is an issue for him despite having caused him to lose a job and now resulted in an ICU admission. Says that he would stop if he "had a reason to". Endorses getting "black out drunk" with some frequency. He does not recall his previous psychiatric diagnoses.    On cognitive assessment, patient is oriented to person, place, and date. He is able to repeat three words and later recalls 2/3. He is able to identify objects. Is able to follow three step commands and say the months of the year backwards. He is able to spell WORLD, but is unable to  spell backwards and becomes confused by clarifying instructions.    Collateral with mother [phone]) with verbal consent from patient. Per mother, patient has had a long history of psychiatric illness. She does not recall diagnosis, but the diagnosis relates to his irritability. She does not recall a period of time where he has gone multiple days without needing sleep. Primarily receives care via the [place]. States that patient was in his usual state of health prior to the present events. She was optimistic when he returned from his trip to [place], as it had gone well and he was thinking about moving back there. He went to work per usual, but that evening she notes that he was drinking and declined dinner. Made odd statements to her about him being her "pet". She notes that there were three empty tall boy IPA cans around the chair that he was sitting. She confirms that there are no firearms in the home.    Psychiatric History  Previous psychiatric diagnoses: ETOH use disorder, depression vs bipolar (primarily psychiatric care through [facility])  Inpatient Hospitalizations: admitted to [phone] in 2011 for depression and [phone] in 2012 for overdose, possible additional hospitalizations through the [place]  Outpatient Treatments  Psychotherapy: unknown  Medication Trials: documented bupropion, duloxetine, and depakote  Suicide Attempts: multiple attempts via overdose  Non-suicidal self-injury: none  Access to firearms: none    Substance Use: ETOH (recent 8-12 beers/day) and tobacco (recent 1/2 to 1 ppd), started at 42  yo  Inpatient SUD treatment hx: multiple inpatient and outpatient CD treatments, most recent inpatient in April-June 2022    Family psych hx: father with bipolar do w/ psychotic features; unknown psychiatric illness in paternal g.ma and g.fa, paternal g.fa completed suicide    Social Hx: lives at home with his mother and a family friend. Works as a Chartered certified accountant. Previously in [employer], overseas in  2001/2002.     I-STOP:       Mental Status Exam  Appearance: appears stated age  Behavior: guarded  Abnormal Movements: none noted  Speech: regular rate, rhythm, and prosody. Spontaneous speech intact  Affect: restricted  Mood: "irritated"  Thought Process: illogical with regard to his ETOH use  Thought Content: denies SI/HI  Perceptual Disturbances: does not appear to be responding internally  Cognition: as above, oriented to person, place, time.  Insight: poor  Judgment: poor       Past Medical History:   Diagnosis Date    Alcoholism     Bipolar disorder     Hypertension      Past Surgical History:   Procedure Laterality Date    TONSILLECTOMY      TYMPANOPLASTY       No family history on file.  Social History     Socioeconomic History    Marital status: Single   Tobacco Use    Smoking status: Every Day     Packs/day: .5     Types: Cigarettes   Substance and Sexual Activity    Alcohol use: Yes     Comment: daily    Drug use: No       Allergies:   Allergies   Allergen Reactions    Zoloft        Medications:  Medications Prior to Admission   Medication Sig    amLODIPine (NORVASC) 10 mg tablet Take 1 tablet (10 mg total) by mouth daily    buPROPion (WELLBUTRIN XL) 150 mg 24 hr tablet Take 1 tablet (150 mg total) by mouth daily    divalproex (DEPAKOTE ER) 500 mg 24 hr tablet Take 2 tablets (1,000 mg total) by mouth nightly    gabapentin 100mg  capsule Take 6 capsules (600 mg total) by mouth 3 times daily    lisinopril (PRINIVIL,ZESTRIL) 10 mg tablet Take 1 tablet (10 mg total) by mouth daily    meloxicam (MOBIC) 15 mg tablet Take 1 tablet (15 mg total) by mouth daily  Take with food.    cyclobenzaprine (FLEXERIL) 10 mg tablet Take 1 tablet (10 mg total) by mouth 3 times daily as needed for Muscle spasms    DULoxetine (CYMBALTA) 60 mg DR capsule Take 1 capsule (60 mg total) by mouth daily    morphine 15 mg immediate release tablet Take 1 tablet (15 mg total) by mouth every 4 hours as needed for Pain  Max daily dose: 90  mg    ALERT CONVERSION COMPLETE  Provider must reconcile medications    epinephrine (EPIPEN 2-PAK) 0.3 MG/0.3ML DEVI USE AS DIRECTED.     Current Facility-Administered Medications   Medication Dose Route Frequency    doxycycline (VIBRAMYCIN) 100 mg in sodium chloride 0.9 % 100 mL mini-bag  100 mg Intravenous Q12H    thiamine (B-1) injection 500 mg  500 mg Intravenous Daily    cefTRIAXone (ROCEPHIN) 1,000 mg in sterile water IV syringe 10 mL  1,000 mg Intravenous Q24H    folic acid injection 1 mg  1 mg Intravenous Daily  Juice (for hypoglycemia) 120 mL  120 mL Oral PRN    And    Glucose (TRUEPLUS) 15 GM oral gel 15 g of Glucose  15 g of Glucose Oral PRN    And    dextrose 50% (0.5 g/mL) injection 25 g  25 g Intravenous PRN    And    glucagon (GLUCAGEN) injection 1 mg  1 mg Intramuscular PRN    acetaminophen (TYLENOL) tablet 1,000 mg  1,000 mg Oral TID    Lidocaine 4 % patch 1 patch  1 patch Transdermal Q24H    sodium chloride 0.9 % FLUSH REQUIRED IF PATIENT HAS IV  0-500 mL/hr Intravenous PRN    dextrose 5 % FLUSH REQUIRED IF PATIENT HAS IV  0-500 mL/hr Intravenous PRN    sodium chloride 0.9 % FLUSH REQUIRED IF PATIENT HAS IV  0-500 mL/hr Intravenous PRN    dextrose 5 % FLUSH REQUIRED IF PATIENT HAS IV  0-500 mL/hr Intravenous PRN    electrolyte (PLASMALYTE) solution  150 mL/hr Intravenous Continuous    enoxaparin (LOVENOX) injection 40 mg  40 mg Subcutaneous Daily @ 2100    albuterol (PROVENTIL) nebulization 2.5 mg  2.5 mg Nebulization Q4H PRN               Labs   All labs in the last 24 hours:   Recent Results (from the past 24 hour(s))   COVID/Influenza A & B/RSV NAAT (PCR)    Collection Time: 06/29/22  9:33 PM   Result Value Ref Range    COVID-19 Source  Nasopharyngeal     COVID-19 NAAT (PCR) NEGATIVE NEG    Influenza A NAAT (PCR) NEGATIVE Negative    Influenza B NAAT (PCR) NEGATIVE Negative    RSV NAAT (PCR) NEGATIVE Negative   Basic metabolic panel    Collection Time: 06/29/22  9:33 PM   Result Value Ref  Range    Glucose 108 (H) 60 - 99 mg/dL    Sodium 161 096 - 045 mmol/L    Potassium 4.8 3.3 - 5.1 mmol/L    Chloride 103 96 - 108 mmol/L    CO2 23 20 - 28 mmol/L    Anion Gap 11 7 - 16    UN 20 6 - 20 mg/dL    Creatinine 4.09 (H) 0.67 - 1.17 mg/dL    eGFR BY CREAT 79 *    Calcium 9.2 9.0 - 10.3 mg/dL   CBC    Collection Time: 06/29/22  9:33 PM   Result Value Ref Range    WBC 13.1 (H) 3.5 - 11.0 THOU/uL    RBC 4.7 4.0 - 6.0 MIL/uL    Hemoglobin 14.8 12.0 - 17.0 g/dL    Hematocrit 44 37 - 52 %    MCV 92 75 - 100 fL    MCH 31 26 - 32 pg    MCHC 34 32 - 37 g/dL    RDW 81.1 0.0 - 91.4 %    Platelets 273 150 - 450 THOU/uL   CK    Collection Time: 06/29/22  9:33 PM   Result Value Ref Range    CK 14,313 (H) 39 - 308 U/L   Magnesium    Collection Time: 06/29/22  9:33 PM   Result Value Ref Range    Magnesium 2.3 1.6 - 2.5 mg/dL   Phosphorus    Collection Time: 06/29/22  9:33 PM   Result Value Ref Range    Phosphorus 3.8 2.7 - 4.5 mg/dL  Hepatic function panel    Collection Time: 06/29/22  9:33 PM   Result Value Ref Range    Total Protein 6.0 (L) 6.3 - 7.7 g/dL    Albumin 3.7 3.5 - 5.2 g/dL    Bilirubin,Total 0.8 0.0 - 1.2 mg/dL    Bili,Indirect see below 0.1 - 1.0 mg/dL    Bilirubin,Direct <1.6 0.0 - 0.3 mg/dL    Alk Phos 74 40 - 109 U/L    AST 425 (H) 0 - 50 U/L    ALT 121 (H) 0 - 50 U/L   Lactate, plasma    Collection Time: 06/29/22  9:33 PM   Result Value Ref Range    Lactate 1.5 0.5 - 2.2 mmol/L   Performing Lab    Collection Time: 06/29/22  9:33 PM   Result Value Ref Range    Performing Lab see below    Hemoglobin A1c    Collection Time: 06/29/22  9:33 PM   Result Value Ref Range    Hemoglobin A1C 5.6 %   Valproic acid level, total    Collection Time: 06/29/22  9:33 PM   Result Value Ref Range    Valproic Acid 75 50 - 100 ug/mL   Blood culture    Collection Time: 06/29/22  9:34 PM    Specimen: Blood: aerobic/anaerobic; OTHER    LAC   Result Value Ref Range    Bacterial Blood Culture .    Blood culture    Collection Time:  06/29/22  9:34 PM    Specimen: Blood: aerobic/anaerobic; OTHER    R AC   Result Value Ref Range    Bacterial Blood Culture .    Venous blood gas    Collection Time: 06/29/22  9:34 PM   Result Value Ref Range    Hemoglobin CANCELED 12.0 - 17.0 g/dL    PH,VENOUS CANCELED 6.04 - 7.42    PCO2,VENOUS CANCELED 40 - 50 mm Hg    PO2,VENOUS CANCELED 25 - 43 mm Hg    Bicarbonate,VENOUS CANCELED 21 - 28 mmol/L    Base Excess,VENOUS CANCELED -3 - 2 mmol/L    CO2 (Calc),VENOUS CANCELED 22 - 31 mmol/L    FO2 HB,VENOUS CANCELED 63 - 83 %    O2 SAT (Calc) CANCELED 63 - 83 %   Venous blood gas    Collection Time: 06/29/22 11:07 PM   Result Value Ref Range    Hemoglobin 15.5 12.0 - 17.0 g/dL    PH,VENOUS 5.40 (H) 9.81 - 7.42    PCO2,VENOUS 38 (L) 40 - 50 mm Hg    PO2,VENOUS 66 (H) 25 - 43 mm Hg    Bicarbonate,VENOUS 25 21 - 28 mmol/L    Base Excess,VENOUS 1 -3 - 2 mmol/L    CO2 (Calc),VENOUS 26 22 - 31 mmol/L    FO2 HB,VENOUS 91 (H) 63 - 83 %    CO 0.3 %    Methemoglobin 0.5 0.0 - 1.0 %   Drug screen chemical dependency, urine    Collection Time: 06/30/22  1:01 AM   Result Value Ref Range    Amphetamine,UR NEG     Cocaine/Metab,UR NEG     Benzodiazepinen,UR POS     Opiates,UR POS     THC Metabolite,UR NEG     Fentanyl, UR NEG ng/mL    Remark,UR See text    Hepatic function panel    Collection Time: 06/30/22  1:01 AM   Result Value Ref Range    Total Protein 5.8 (  L) 6.3 - 7.7 g/dL    Albumin 3.4 (L) 3.5 - 5.2 g/dL    Bilirubin,Total 0.7 0.0 - 1.2 mg/dL    Bili,Indirect see below 0.1 - 1.0 mg/dL    Bilirubin,Direct <5.7 0.0 - 0.3 mg/dL    Alk Phos 71 40 - 846 U/L    AST 384 (H) 0 - 50 U/L    ALT 123 (H) 0 - 50 U/L   CK    Collection Time: 06/30/22  1:01 AM   Result Value Ref Range    CK 12,248 (H) 39 - 308 U/L   Basic metabolic panel    Collection Time: 06/30/22  1:01 AM   Result Value Ref Range    Glucose 104 (H) 60 - 99 mg/dL    Sodium 962 952 - 841 mmol/L    Potassium 4.7 3.3 - 5.1 mmol/L    Chloride 102 96 - 108 mmol/L    CO2 23 20  - 28 mmol/L    Anion Gap 12 7 - 16    UN 19 6 - 20 mg/dL    Creatinine 3.24 4.01 - 1.17 mg/dL    eGFR BY CREAT 85 *    Calcium 8.7 (L) 9.0 - 10.3 mg/dL   CBC and differential    Collection Time: 06/30/22  1:01 AM   Result Value Ref Range    WBC 12.1 (H) 3.5 - 11.0 THOU/uL    RBC 4.7 4.0 - 6.0 MIL/uL    Hemoglobin 14.9 12.0 - 17.0 g/dL    Hematocrit 44 37 - 52 %    MCV 93 75 - 100 fL    MCH 32 26 - 32 pg    MCHC 34 32 - 37 g/dL    RDW 02.7 0.0 - 25.3 %    Platelets 275 150 - 450 THOU/uL    Seg Neut % 66.8 %    Lymphocyte % 22.2 %    Monocyte % 9.9 %    Eosinophil % 0.5 %    Basophil % 0.2 %    Neut # K/uL 8.1 (H) 1.5 - 6.5 THOU/uL    Lymph # K/uL 2.7 1.0 - 5.0 THOU/uL    Mono # K/uL 1.2 (H) 0.1 - 1.0 THOU/uL    Eos # K/uL 0.1 0.0 - 0.5 THOU/uL    Baso # K/uL 0.0 0.0 - 0.2 THOU/uL    Nucl RBC % 0.0 0.0 - 0.2 /100 WBC    Nucl RBC # K/uL 0.0 0.0 - 0.0 THOU/uL    IMM Granulocytes # 0.1 (H) 0.0 - 0.0 THOU/uL    IMM Granulocytes 0.4 %     *Note: Due to a large number of results and/or encounters for the requested time period, some results have not been displayed. A complete set of results can be found in Results Review.       Vitals  BP: 114/76  Temp: 37.1 C (98.8 F)  Temp src: Foley  Heart Rate: 108  Resp: 18  SpO2: 95 %  Height: 182.9 cm (6')  Weight: 113.8 kg (250 lb 14.1 oz)        Assessment:   Jeffrey Reese is a 42 yo male with history of HTN, ETOH use disorder, and unspecified mood disorder who is admitted following overdose with ETOH and medications of unknown type/quantity. Psychiatry following for safety evaluation. Patient's history is consistent with longstanding ETOH disorder with recurrent relapse following CD treatment, currently in the pre-contemplative phase with regard to future sobriety.  He demonstrates significantly poor insight and judgment into his use and the severity with which he has presented to the hospital as a consequence of his use. His delirium continues to resolve, though has ongoing  deficits in attention suggestive of ongoing altered mental status. His psychiatric presentation may evolve as he continues to medically clear and he begins to process the events that led to his presentation. He is not able to adequately demonstrate safety with regards to suicidal thinking and risk taking behavior. At this time, he is not psychiatrically cleared for discharge. Psychiatry will continue to follow for ongoing safety evaluation. Would recommend restarting his psychotropic medications pending clinical improvement in kidney and liver function. Depakote would be our first recommendation of medication to resume with other home medications to follow.    Suicide Risk Assessment: Patient is at elevated risk for suicide in the setting of recent overdose with ETOH and unknown medications.     Did this patient's condition require a mandatory 9.46 report to the [facility]? no    Diagnoses:  ETOH use disorder with intentional vs unintentional overdose    Recommendations  - pt can NOT leave AMA, psych to re-eval for safety prior to discharge  - ok to discontinue 1:1  - restart home psychotropic medications as medically appropriate. Would recommend:   - restart depakote 1/6 pending improvement in liver and kidney function   - restart gapapentin 1/6 pending improvement in kidney function   - hold duloxetine and bupropion for now, can restart pending clinical course  - add NRT (patch), smokes 1/2 to 1 ppd    PCLS will continue to follow. Case discussed with Dr. Gayleen Orem.    Ivonne Andrew, MD   Psychiatry PGY-1  06/30/2022 2:35 PM  Available via secure chat

## 2022-06-30 NOTE — Plan of Care (Signed)
Problem: Safety  Goal: Patient will remain free of falls  Outcome: Maintaining     Problem: Pain/Comfort  Goal: Patient's pain or discomfort is manageable  Outcome: Maintaining     Problem: Nutrition  Goal: Patient's nutritional status is maintained or improved  Outcome: Maintaining   Advanced to regular diet, tolerating  Problem: Mobility  Goal: Patient's functional status is maintained or improved  Outcome: Maintaining     Problem: Psychosocial  Goal: Demonstrates ability to cope with illness  Outcome: Maintaining     Problem: Cognitive function  Goal: Cognitive function will be maintained or return to baseline  Description: Interventions:  Delirium Assessment  LIVEBAR Assessment    Outcome: Maintaining

## 2022-06-30 NOTE — Progress Notes (Signed)
ICU admission order was cancelled by me overnight per request from [facility] overnight. Just spoke with [facility] to clarify ICU admission status.

## 2022-06-30 NOTE — Progress Notes (Signed)
MICU Attending Progress Note    SYNOPSIS OF OUR ASSESSMENT AND PLANS:     Resolved encephalopathy from medication / drug use.  He has limited recollection of the events of yesterday.   Improving mild acute kidney injury from rhabdomyolysis with down-trending CK and good UOP. Decrease lab frequency.   Continue CTX&doxy for possible aspiration/CAP, favor short duration 3-5 days.   Tolerating PO diet.  High-dose thiamine with h/o alcohol abuse.   Right ankle edema likely soft tissue swelling from body position / rhabdo.  Check x-rays to r/o fracture.   He has history of suicide attempts and had pill bottles at home, but denies suicidal ideation this a.m..  We will continue 1:1 and Psych consult before clearing.      If there are key historical elements or objective findings that I think deserve particular emphasis, I have recorded them below. A summary of key elements of our assessment and plans is listed above. Please refer to today's ICU Admission/Progress note for complete details of our mutually agreed upon findings, assessment, and recommendations.     History of current illness:    42 yo h/o suicide attempt, polysubstance abuse admitted 1 days after being found unresponsive with rhabdo (CK ~20K). He received valium in ED.   Tox screen + benzo and opiates.      Objective findings include:     Alert, Rt ankle swelling and pain, extr warm, equal BS, abd soft and non-tender, equal coarse BS     _______________________________________________________________________________  This patient was evaluated on rounds with ICU Team (fellow, resident, intern, NP and/or PA).  I personally examined the patient.  All nursing documentation, laboratory data, test results, and radiographs were reviewed and interpreted by me.  I have established the management plan for this patient's critical illness and have been immediately available to assist with patient care.  I agree with the database, findings, and plan of care recorded in  the note by the resident physician, NP and/or PA. This patient is critically ill with at least 1 organ system failure associated with a high probability of imminent or life threatening deterioration.  The care I have delivered involves high complexity decision making to assess, manipulate, and support vital system function(s), to treat the vital organ system failure and/or prevent further life threatening deterioration of the patient's condition.  Critical care time: I spent 40 minutes personally attending to this patient's critical care needs.  This critical care time is exclusive of any time for separately billable procedures. My documented total critical care time reflects my own patient care time and does not include teaching or procedure time.

## 2022-06-30 NOTE — Plan of Care (Signed)
Problem: Safety  Goal: Patient will remain free of falls  Outcome: Progressing towards goal     Problem: Pain/Comfort  Goal: Patient's pain or discomfort is manageable  Outcome: Progressing towards goal     Problem: Nutrition  Goal: Patient's nutritional status is maintained or improved  Outcome: Progressing towards goal     Problem: Mobility  Goal: Patient's functional status is maintained or improved  Outcome: Progressing towards goal     Problem: Psychosocial  Goal: Demonstrates ability to cope with illness  Outcome: Progressing towards goal     Problem: Cognitive function  Goal: Cognitive function will be maintained or return to baseline  Description: Interventions:  Delirium Assessment  LIVEBAR Assessment    Outcome: Progressing towards goal

## 2022-06-30 NOTE — Progress Notes (Cosign Needed)
Toxicology Progress Note    Requesting Physician: Oretha Caprice, MD     Reason for Consult: Suspected toxic ingestion    Length of Stay: 1 days    Subjective: Jeffrey Reese is a 42 y.o. male with PMH significant for HTN, EtOH use disorder, bipolar, and previous suicide attempts (salicylates) who presents with AMS 2/2 suspected polypharmacy overdose.      His current prescription medications ZOX:[id number], Depakote, duloxetine, gabapentin, and trazodone.      Interval History: Patient states he is doing better today, encephalopathy resolving. Patient oriented to person and place. He is unable to recall the events leading up to his hospitalization. Patient unsure if he took an overdose of his prescription medications, but has prior SI attempts. He states that he drinks 2-3 tall IPAs a day, no previous alcohol withdrawal. No reported street drug use. He denies anxiety or feeling uncomfortable, only complaining of RLE pain and swelling. Patient normotensive, tachycardic to low 100s.     Scheduled Meds:   doxycycline IV  100 mg Intravenous Q12H    thiamine  500 mg Intravenous Daily    cefTRIAXone  1,000 mg Intravenous Q24H    folic acid  1 mg Intravenous Daily    acetaminophen  1,000 mg Oral TID    Lidocaine  1 patch Transdermal Q24H    enoxaparin  40 mg Subcutaneous Daily @ 2100     Continuous Infusions:   electrolyte 150 mL/hr (06/30/22 0900)     PRN Meds:Juice **AND** dextrose **AND** dextrose **AND** glucagon **AND** [START ON 07/01/2022] POCT glucose, sodium chloride, dextrose, sodium chloride, dextrose, albuterol    Objective:    Vital signs in last 24 hours:  Temp:  [36.4 C (97.5 F)-38.1 C (100.6 F)] 37.1 C (98.8 F)  Heart Rate:  [96-112] 108  Resp:  [12-32] 18  BP: (90-121)/(56-89) 114/76    Physical Exam    Recent Results (from the past 24 hour(s))   Troponin T 3 HR W/ Delta High Sensitivity    Collection Time: 06/29/22 12:48 PM   Result Value Ref Range    TROP T 3 HR High Sensitivity 24 (H) 0 -  14 ng/L    TROP T 0-3 HR DELTA High Sensitivity 5 (H) 0 - 4   Urinalysis reflex to culture    Collection Time: 06/29/22 12:48 PM   Result Value Ref Range    Color, UA Yellow Yellow-Dk Yellow    Appearance,UR Cloudy (!) Clear    Specific Gravity,UA 1.010 1.002 - 1.030    Leuk Esterase,UA NEG NEGATIVE    Nitrite,UA NEG NEGATIVE    pH,UA 6.0 5.0 - 8.0    Protein,UA NEG NEGATIVE    Glucose,UA 1+ (!) NEGATIVE    Ketones, UA NEG NEGATIVE    Blood,UA 3+ (!) NEGATIVE   Urine microscopic (iq200)    Collection Time: 06/29/22 12:48 PM   Result Value Ref Range    RBC,UA 0-2 0 - 2 /hpf    WBC,UA 0-5 0 - 5 /hpf    Bacteria,UA None Seen None Seen - 1+    Hyaline Casts,UA 6-10 (!) 0 - 5 /lpf    Squam Epithel,UA 1+ 0-1+ /lpf    Mucus,UA Present Not Present   Potassium    Collection Time: 06/29/22  1:21 PM   Result Value Ref Range    Potassium 4.8 3.3 - 5.1 mmol/L   Venous gases / whole blood panel    Collection Time: 06/29/22  1:22 PM   Result Value  Ref Range    Hemoglobin 16.4 12.0 - 17.0 g/dL    PH,VENOUS 1.61 0.96 - 7.42    PCO2,VENOUS 39 (L) 40 - 50 mm Hg    PO2,VENOUS 47 (H) 25 - 43 mm Hg    Bicarbonate,VENOUS 22 21 - 28 mmol/L    Base Excess,VENOUS -3 -3 - 2 mmol/L    CO2 (Calc),VENOUS 23 22 - 31 mmol/L    FO2 HB,VENOUS 78 63 - 83 %    CO 0.5 %    Methemoglobin 0.5 0.0 - 1.0 %    NA, WB 135 135 - 145 mmol/L    Potassium,WB 4.3 3.3 - 4.6 mmol/L    ICA Uncorr,WB 4.9 4.8 - 5.2 mg/dL    ICA @7 .4,WB 4.8 4.8 - 5.2 mg/dL    Lactate VEN,WB 2.8 (H) 0.5 - 2.2 mmol/L    Glucose,WB 133 (H) 60 - 99 mg/dL   Unable to Perform Add-On Testing    Collection Time: 06/29/22  1:23 PM   Result Value Ref Range    Unable to Perform Add-on Testing SEE COMMENT    POCT glucose    Collection Time: 06/29/22  1:24 PM   Result Value Ref Range    Glucose POCT 136 (H) 60 - 99 mg/dL   Basic metabolic panel    Collection Time: 06/29/22  2:45 PM   Result Value Ref Range    Glucose 119 (H) 60 - 99 mg/dL    Sodium 045 409 - 811 mmol/L    Potassium 4.7 3.3 - 5.1  mmol/L    Chloride 99 96 - 108 mmol/L    CO2 23 20 - 28 mmol/L    Anion Gap 13 7 - 16    UN 22 (H) 6 - 20 mg/dL    Creatinine 9.14 (H) 0.67 - 1.17 mg/dL    eGFR BY CREAT 62 *    Calcium 9.2 9.0 - 10.3 mg/dL   CK    Collection Time: 06/29/22  2:45 PM   Result Value Ref Range    CK 15,359 (H) 39 - 308 U/L   POCT glucose    Collection Time: 06/29/22  2:45 PM   Result Value Ref Range    Glucose POCT 112 (H) 60 - 99 mg/dL   COVID/Influenza A & B/RSV NAAT (PCR)    Collection Time: 06/29/22  9:33 PM   Result Value Ref Range    COVID-19 Source  Nasopharyngeal     COVID-19 NAAT (PCR) NEGATIVE NEG    Influenza A NAAT (PCR) NEGATIVE Negative    Influenza B NAAT (PCR) NEGATIVE Negative    RSV NAAT (PCR) NEGATIVE Negative   Basic metabolic panel    Collection Time: 06/29/22  9:33 PM   Result Value Ref Range    Glucose 108 (H) 60 - 99 mg/dL    Sodium 782 956 - 213 mmol/L    Potassium 4.8 3.3 - 5.1 mmol/L    Chloride 103 96 - 108 mmol/L    CO2 23 20 - 28 mmol/L    Anion Gap 11 7 - 16    UN 20 6 - 20 mg/dL    Creatinine 0.86 (H) 0.67 - 1.17 mg/dL    eGFR BY CREAT 79 *    Calcium 9.2 9.0 - 10.3 mg/dL   CBC    Collection Time: 06/29/22  9:33 PM   Result Value Ref Range    WBC 13.1 (H) 3.5 - 11.0 THOU/uL    RBC 4.7 4.0 -  6.0 MIL/uL    Hemoglobin 14.8 12.0 - 17.0 g/dL    Hematocrit 44 37 - 52 %    MCV 92 75 - 100 fL    MCH 31 26 - 32 pg    MCHC 34 32 - 37 g/dL    RDW 16.1 0.0 - 09.6 %    Platelets 273 150 - 450 THOU/uL   CK    Collection Time: 06/29/22  9:33 PM   Result Value Ref Range    CK 14,313 (H) 39 - 308 U/L   Magnesium    Collection Time: 06/29/22  9:33 PM   Result Value Ref Range    Magnesium 2.3 1.6 - 2.5 mg/dL   Phosphorus    Collection Time: 06/29/22  9:33 PM   Result Value Ref Range    Phosphorus 3.8 2.7 - 4.5 mg/dL   Hepatic function panel    Collection Time: 06/29/22  9:33 PM   Result Value Ref Range    Total Protein 6.0 (L) 6.3 - 7.7 g/dL    Albumin 3.7 3.5 - 5.2 g/dL    Bilirubin,Total 0.8 0.0 - 1.2 mg/dL     Bili,Indirect see below 0.1 - 1.0 mg/dL    Bilirubin,Direct <0.4 0.0 - 0.3 mg/dL    Alk Phos 74 40 - 540 U/L    AST 425 (H) 0 - 50 U/L    ALT 121 (H) 0 - 50 U/L   Lactate, plasma    Collection Time: 06/29/22  9:33 PM   Result Value Ref Range    Lactate 1.5 0.5 - 2.2 mmol/L   Performing Lab    Collection Time: 06/29/22  9:33 PM   Result Value Ref Range    Performing Lab see below    Hemoglobin A1c    Collection Time: 06/29/22  9:33 PM   Result Value Ref Range    Hemoglobin A1C 5.6 %   Valproic acid level, total    Collection Time: 06/29/22  9:33 PM   Result Value Ref Range    Valproic Acid 75 50 - 100 ug/mL   Blood culture    Collection Time: 06/29/22  9:34 PM    Specimen: Blood: aerobic/anaerobic; OTHER    LAC   Result Value Ref Range    Bacterial Blood Culture .    Blood culture    Collection Time: 06/29/22  9:34 PM    Specimen: Blood: aerobic/anaerobic; OTHER    R AC   Result Value Ref Range    Bacterial Blood Culture .    Venous blood gas    Collection Time: 06/29/22  9:34 PM   Result Value Ref Range    Hemoglobin CANCELED 12.0 - 17.0 g/dL    PH,VENOUS CANCELED 9.81 - 7.42    PCO2,VENOUS CANCELED 40 - 50 mm Hg    PO2,VENOUS CANCELED 25 - 43 mm Hg    Bicarbonate,VENOUS CANCELED 21 - 28 mmol/L    Base Excess,VENOUS CANCELED -3 - 2 mmol/L    CO2 (Calc),VENOUS CANCELED 22 - 31 mmol/L    FO2 HB,VENOUS CANCELED 63 - 83 %    O2 SAT (Calc) CANCELED 63 - 83 %   Venous blood gas    Collection Time: 06/29/22 11:07 PM   Result Value Ref Range    Hemoglobin 15.5 12.0 - 17.0 g/dL    PH,VENOUS 1.91 (H) 4.78 - 7.42    PCO2,VENOUS 38 (L) 40 - 50 mm Hg    PO2,VENOUS 66 (H) 25 - 43 mm  Hg    Bicarbonate,VENOUS 25 21 - 28 mmol/L    Base Excess,VENOUS 1 -3 - 2 mmol/L    CO2 (Calc),VENOUS 26 22 - 31 mmol/L    FO2 HB,VENOUS 91 (H) 63 - 83 %    CO 0.3 %    Methemoglobin 0.5 0.0 - 1.0 %     *Note: Due to a large number of results and/or encounters for the requested time period, some results have not been displayed. A complete set of  results can be found in Results Review.         Assessment/Plan:  Jeffrey Reese is a 42 y.o. male who presents with unresponsiveness following suspected multi-substance ingestion.  The patient's current toxicological problems are: suspected toxic effect from ingested substance and toxic encephalopathy, resolving. On examination, patient is diaphoretic and slightly tachycardic. He does not appear to be in clinical alcohol withdrawal, however. He has continued nystagmus and some tremulousness, suggesting ongoing toxidrome.His AMS is continuing to improve, however he is still unable to remember events that let to his hospitalization. He is complaining of swelling and pain in his right leg, and given his prolonged downtime and elevated CK, suggestive of rhabdomyolsis.     For the above problems, the recommendations are as follows:  -Continued supportive ICU care.  -Continue to monitor patient for signs of alcohol withdrawal. Limited use for CIWA scoring for diagnostic purposes.   -Follow-up Xray right leg.  -Continued treatment for rhabdomyolysis per primary team.    Patient was seen by myself and toxicology team on 06/30/2022.     Contact the Toxicology Attending through [employer] or the [employer].    Jeffrey Alamin, DO  06/30/2022 12:28 PM    Medical Toxicology Faculty Note Dr. Lincoln Reese Attending:    Patient independently seen and examined and I have reviewed the above note and agree with the history, exam, assessment and plan. All data and labs reviewed.    Today the patient has no residual encephalopathy. He has significant history of alcohol use disorder, active use.  He is denying any other active or historical substance use disorders.  I suspect prolonged downtime (though in a easy chair).  He has active alcohol use disorder but seems to have limited insight into its contribution to his current critical illness.  From a tox perspective, we have no further recommendations other than management of  his rhabdo.      On 06/30/22 at 1100 I evaluated the patient on unit floor, reviewed test results, records, and provided recommendations and documented my findings.

## 2022-06-30 NOTE — Progress Notes (Signed)
Report Given To  Cece, RN       Descriptive Sentence / Reason for Admission   Leonel Wakley is a 42 y.o. male with PMH significant for prior SI attempts salicylates, 3-10 tall beers daily, prior alchol withdrawal with no reported seizures who presents with unresponsiveness, empty bottles and beers found on scene. Last well 1/3. Last drink 1/3. Found by EMS agonal put on NRB and given IV Narcan with some improvement.         Active Issues / Relevant Events   NPN [id number] [id number]:    Assumed care from ED at 0600. Patient drowsy but arousable and able to follow commands. Patient appears to be flushed in the face but afebrile. Foley in place draining clear yellow urine. Patient on 4LNC saturating high 90s. Plasmalyte at 186ml/hr. Patient A/O x3. Patient on CIWA protocol last drink on 06/28/22. Please refer to [facility] and flowsheets for further documentation and nursing interventions.  Bethann Berkshire, RN        To Do List          Anticipatory Guidance / Discharge Planning

## 2022-06-30 NOTE — Progress Notes (Signed)
Report Given To  Bethann Berkshire, RN      Descriptive Sentence / Reason for Admission   Jeffrey Reese is a 42 y.o. male with PMH significant for prior SI attempts salicylates, 3-10 tall beers daily, prior alchol withdrawal with no reported seizures who presents with unresponsiveness, empty bottles and beers found on scene. Last well 1/3. Last drink 1/3. Found by EMS agonal put on NRB and given IV Narcan with some improvement.         Active Issues / Relevant Events   Nursing note: 01-3399 MICU 0700-1900.  Please see Doc flow sheet for VS, assessment detail, and nursing interventions. Patient AVSS. Patient weaned from 4L NC to room-air without s/s of distress or desats. Patient reports pain, started on scheduled tylenol. X-rays done on R foot. Psych consult at bedside, see notes for further details. Pt currently in bed, HOB 30 degrees, NAE.      To Do List  VS q 1   BG Daily @0600   Labs Daily  Regular Diet          Anticipatory Guidance / Discharge Planning   ABCDEF Bundle  Patient likes to be called: Jeffrey Reese    Fluids & Electrolytes: plasmalyte @100cc /hr    Nutrition / last BM:  Regular Diet / 1/3    List of antimicrobials: doxycycline, ceftriaxone      Pain  NVPS/NRS score: 7  Goal score: 0  Medications: acetaminophen    Sedation  SAS score: 4  Goal score: 4  Medications: none    Breathing  Spontaneous awakening trial (SAT): n/a  Spontaneous breathing trial (SBT)/PST:   Trach Collar Trial:  Can we decrease FiO2: room-air  Can we decrease PEEP:   Vent orders match vent settings:   Pulmonary toilet:   Structured oral care: po intake  Head of bed at 30: self regulated    Drains / Lines / Tubes:  PIV x2, foley    Delirium  CAM-ICU:   Medications:   Restraints:   Environmental interventions:   4 hours of uninterrupted sleep:     Mobility  Mobility/PT orders: yes  Mobility goal: OOBTC  Mobility goal achieved: not yet   Can mobility be advanced: yes      Family  Is family engaged: yes  Family concerns: mental health,  drinking    Micellaneous  Indication for foley: accurate I&Os, rhabdo   SUP: n/a  DVT prophylaxis: sq lovenox   Frequency of labs be reduced: daily  Code status: FULL  Disposition: home?  Acuity Level: 4  Nursing reiterate plan for day: yes

## 2022-06-30 NOTE — Progress Notes (Signed)
Report Given To  [id number]      Descriptive Sentence / Reason for Admission   Jeffrey Reese is a 42 y.o. male with PMH significant for prior SI attempts salicylates, 3-10 tall beers daily, prior alchol withdrawal with no reported seizures who presents with unresponsiveness, empty bottles and beers found on scene. Last well 1/3. Last drink 1/3. Found by EMS agonal put on NRB and given IV Narcan with some improvement.         Active Issues / Relevant Events   Nursing note: 01-3399 MICU 1900-0030.  Please see Doc flow sheet for VS, assessment detail, and nursing interventions. Patient AVSS. NAE. Patient tolerating room air. Patient is drowsy but arousable and oriented. Complains of no pain. Please refer to [facility] and flowsheets for further documentation and nursing interventions.      To Do List  VS q 1   BG Daily @0600   Labs Daily  Regular Diet          Anticipatory Guidance / Discharge Planning   ABCDEF Bundle  Patient likes to be called: Jeffrey Reese    Fluids & Electrolytes: plasmalyte @100cc /hr    Nutrition / last BM:  Regular Diet / 1/3    List of antimicrobials: doxycycline, ceftriaxone      Pain  NVPS/NRS score: 7  Goal score: 0  Medications: acetaminophen    Sedation  SAS score: 4  Goal score: 4  Medications: none    Breathing  Spontaneous awakening trial (SAT): n/a  Spontaneous breathing trial (SBT)/PST:   Trach Collar Trial:  Can we decrease FiO2: room-air  Can we decrease PEEP:   Vent orders match vent settings:   Pulmonary toilet:   Structured oral care: po intake  Head of bed at 30: self regulated    Drains / Lines / Tubes:  PIV x2, foley    Delirium  CAM-ICU:   Medications:   Restraints:   Environmental interventions:   4 hours of uninterrupted sleep:     Mobility  Mobility/PT orders: yes  Mobility goal: OOBTC  Mobility goal achieved: not yet   Can mobility be advanced: yes      Family  Is family engaged: yes  Family concerns: mental health, drinking    Micellaneous  Indication for foley: accurate I&Os, rhabdo   SUP:  n/a  DVT prophylaxis: sq lovenox   Frequency of labs be reduced: daily  Code status: FULL  Disposition: home?  Acuity Level: 4  Nursing reiterate plan for day: yes

## 2022-06-30 NOTE — Progress Notes (Signed)
Medical ICU Progress Note  LOS: 1 Days    Interval History/Subjective:  -Patient seen and examined at bedside, reports that he has right lower extremity pain but otherwise is without complaints.  Is alert and oriented on exam today.  Does not recall the events preceding his hospitalization.  Reports that he remembers walking his dog and that is the last thing prior to coming to the hospital.  He currently denies any depressed mood or suicidal ideation.  He does not recall any life events recently that would have made him feel as though he would want to harm himself or take his own life.  He does have poor insight into his drinking habits and admits that he blacks out from time to time    Objective   Vital Signs: BP 105/75   Pulse 97   Temp 37 C (98.6 F)   Resp 12   Ht 1.829 m (6')   Wt 113.8 kg (250 lb 14.1 oz)   SpO2 97%   BMI 34.03 kg/m     Physical Exam  Gen: Alert, oriented, interactive throughout exam  HEENT:facial flushing    Pulmonary: Normal WOB, no wheezing rales or rhonchi  Cardiovascular: pulses present, tachycardic  GI: no rigidity  Extrem: Right lower extremity edema, tenderness to palpation  GU: Foley draining yellow  Neuro: no apparent rigidity    Assessment: Jeffrey Reese is 42 y.o. male with PMH significant for up to 8 big beers daily, no reported seizures from withdrawals, prior salicylate SI attempt, here with unresponsivness, related to toxidrome and potentially infectious as well, such as aspiration. MICU admission for increased monitoring needs, now with improved mental status, one-to-one discontinued after psychiatric evaluation, plan to call out to 192 for further psychiatric care.    Plan (by system - relevant labs/imaging will be commented on here):    Pulmonary:    - CXR R>L opacities  -needed NRB now NC, no reported home O2  -if not ventilating himself, will need tube  - Is the patient on mechanical ventilation? No,    - Most recent pulmonary function tests: unknown  -most  recent VBG suggests hyperventilation  -repeat VBG    CV:   - trop intermediate risk 19->24 3hr pathway, demand mediated myocardial injury  - serial EKG consistent  - if new CP, repeat serial trops and serial EKG  -rhythm is sinus tachy regular  -BNP<50     Renal:   - trend CK, Cr, K  -continue 100cc/hr IVF for rhabdo  -CR improved after IV fluids    Intake/Output Summary (Last 24 hours) at 06/30/2022 0727  Last data filed at 06/30/2022 0559  Gross per 24 hour   Intake 4494 ml   Output 1525 ml   Net 2969 ml     - Indwelling urinary catheter?:  Yes; can de-escalate Foley after discontinuing IV fluids    GI:   -Alert oriented, safe for regular diet  -trend mildly elevated LFTs, ratio suggestive of alcohol injury  -can check [place] for signs of cirrhosis or steatosis  - Enteral Nutrition: no  - Indications for GI prophylaxis: no  - Bowel Regimen: No; NPO     ID (Specific known/suspected infections, sources):   - encephalopathy most likely toxidrome  -no apparent nuchal rigidity  -given fever, leukocytosis, and CXR findings, start CAP treatment for coverage of oral flora  -may be pneumonitis, can de-escalate abx  -COVID flu RSV negative  -UA not suggestive of infection  Heme:   - stable H/H  - Chemical DVT Prophylaxis is indicated; Lovenox     Endo:    - TFTs normal  -add on A1c  -hypoglyemic protocol ordered while NPO     Neuro/Psych:   - home meds based on dispense report:  Bupropion 150 NDRI (adrenergic effects in overdose)  Flexeril 10 TID (anticholinergic in overdose)  Divalproex 500 BID (checking valproic acid levels)  Duloxetine 60 (no apparent rigidity although yes fever, tachy)  Gabapentin 600 TID  Naltrexone 50   -holding home psych meds while encephalopathic  -salicylates, ethanol, acetaminophen negative  -Right lower extremity pain, plain film without evidence of abnormality.  -Discontinue CIWA, discontinue one-to-one [facility] psychiatry recommendations and evaluation, patient does not have capacity to leave the  hospital AMA.  He will be called out to 192 for further medical and psychiatric evaluation.  -high dose thiamine  -continue folic acid  -utox with benzo, opiate, negative salicylate.  - Analgesics: PRN  - Engage PT? : Yes  - Mobility plan: pending PT     Integument: Clean intact  Lines/Tubes/Drains: Foley in place  De-escalate labs: Yes  Medication Reconciliation Completed: Yes   Code Status: FULL CODE  Plan of care discussed with: Multidisciplinary ICU care team  Family concern/updates: Mother at bedside  Bundle reviewed with bedside RN: Yes      [name] PGY-1  Pulmonary/Critical Care Medicine

## 2022-06-30 NOTE — Progress Notes (Addendum)
4 Eyed Skin Check        Jeffrey Reese is a 42 y.o. year old male, admitted/transferred to unit on 06/30/22 at 0600.    [x]  New Admission  []  Transfer: Unit     4 Eyed Skin Assessment:  Skin Breakdown Present: Yes []   No [x]    IF Yes - Suspected Pressure Injury: Yes []   No []    IF No- What type of breakdown suspected: None        Location of Wound(s): None   Description:            Interventions Put in Place:  []  Wound Consult Ordered  [x]  Hillrom bed  [x]  Two pillow turning and repositioning  [x]  Protective Allevyn Gentle dressing  []  30 degree Turning Wedge  []  Prevalon boot  [x]  Pillow heel offloading  []  Waffle chair cushion    Complete 4 Eyed Skin Assessment Completed with: Lanell Persons RN

## 2022-06-30 NOTE — Progress Notes (Addendum)
MICU Fellow Note:     This is a young patient with suspected polypharmacy overdose who was evaluated by MICU team earlier in the day. There is a concern for Bupropion overdose, albeit reassuring that he did not ingest large amount of pills based on pill counting, in addition to alcohol withdrawal. He was treated with oral diazepam prior to my evaluation.     I feel the combination of this just complicates his clinical picture and management for this young patient.      He opens his eyes to gentle sternal rub. He appears drowsy, not able to hold conversation and falls back to sleep mid conversation. He is able to lift his head off of the bed, answer some questions, voice is audible and clear. There are no oral secretions. His lung sounds clear. He is saturating 100% on 4LNC. Blood gas shows mild respiratory alkalosis. His face appears flushed but he is not diaphoretic. I do no hear wheezing.     Temperature of 100.6 F, mild leukocytosis. He is on IV Ceftriaxone and Doxycycline. Down trending rhabdomyolysis, he is on IVF. Normal K+, presented with hyperkalemia. Cr improving. Deranged AST: ALT and this ratio is >2:1 that suggests alcoholic hepatitis. Normal blood glucose level. COVID/Flu/RSV neg. Lactate 1.1, improved from 2.9. Normal Mag and Phos.     Normal Valproic acid level (75). Normal Ethanol, Salicylate and Acetaminophen level. Utox positive for BZD and Opiate.     CT head neg for acute bleed. CXR hypo-ventilatory with bilateral basal atelectasis/aspiration.       Plan:   Transfer to [facility]. Try to wean oxygen as tolerated. He is able to protect his airway.   CIWA-Ar. Thiamine. Ok to treat with oral diazepam 10 mg as he has shown positive response to it. At risk for withdrawal with his history of heavy drinking.   Plasmalyte at 150 cc/hr for rhabdomyolysis.   Ok to continue the Ceftriaxone and Doxycycline for now.         Jerrye Beavers  Fellow, Pulmonary and Critical Care Medicine  Pager: [phone]

## 2022-07-01 ENCOUNTER — Inpatient Hospital Stay: Payer: Non-veteran care

## 2022-07-01 DIAGNOSIS — T39091A Poisoning by salicylates, accidental (unintentional), initial encounter: Secondary | ICD-10-CM

## 2022-07-01 DIAGNOSIS — F1024 Alcohol dependence with alcohol-induced mood disorder: Secondary | ICD-10-CM

## 2022-07-01 DIAGNOSIS — M79604 Pain in right leg: Secondary | ICD-10-CM

## 2022-07-01 LAB — AMMONIA: Ammonia: 55 umol/L — ABNORMAL HIGH (ref 10–47)

## 2022-07-01 LAB — HEPATIC FUNCTION PANEL
ALT: 97 U/L — ABNORMAL HIGH (ref 0–50)
AST: 246 U/L — ABNORMAL HIGH (ref 0–50)
Albumin: 3.3 g/dL — ABNORMAL LOW (ref 3.5–5.2)
Alk Phos: 67 U/L (ref 40–130)
Bilirubin,Direct: <0.2 mg/dL (ref 0.0–0.3)
Bilirubin,Total: 0.3 mg/dL (ref 0.0–1.2)
Total Protein: 4.9 g/dL — ABNORMAL LOW (ref 6.3–7.7)

## 2022-07-01 LAB — BASIC METABOLIC PANEL
Anion Gap: 10 (ref 7–16)
CO2: 26 mmol/L (ref 20–28)
Calcium: 8.5 mg/dL — ABNORMAL LOW (ref 9.0–10.3)
Chloride: 103 mmol/L (ref 96–108)
Creatinine: 0.89 mg/dL (ref 0.67–1.17)
Glucose: 100 mg/dL — ABNORMAL HIGH (ref 60–99)
Lab: 14 mg/dL (ref 6–20)
Potassium: 4.2 mmol/L (ref 3.3–5.1)
Sodium: 139 mmol/L (ref 133–145)
eGFR BY CREAT: 110 *

## 2022-07-01 LAB — POCT GLUCOSE: Glucose POCT: 91 mg/dL (ref 60–99)

## 2022-07-01 LAB — UNABLE TO PERFORM ADD-ON TESTING 1

## 2022-07-01 LAB — MAGNESIUM: Magnesium: 2.3 mg/dL (ref 1.6–2.5)

## 2022-07-01 LAB — CK: CK: 6697 U/L — ABNORMAL HIGH (ref 39–308)

## 2022-07-01 LAB — PHOSPHORUS: Phosphorus: 3.4 mg/dL (ref 2.7–4.5)

## 2022-07-01 MED ORDER — GABAPENTIN 600 MG PO TABLET *I*
600.0000 mg | ORAL_TABLET | Freq: Three times a day (TID) | ORAL | Status: DC
Start: 2022-07-01 — End: 2022-07-03
  Administered 2022-07-01 – 2022-07-03 (×7): 600 mg via ORAL
  Filled 2022-07-01 (×7): qty 1

## 2022-07-01 NOTE — Progress Notes (Signed)
Physical Therapy Initial Evaluation    Therapy Recommendations:  Discharge Recommendations:  Discharge to Planned Living Arrangement:    Patient's mobility is currently a barrier to discharge. Further PT visits are needed.  Recommendations:   PT Discharge Equipment Recommended: To be determined   Additional justification:   n/a  PT Positioning Recommendations: OOBTC  PT Mobility Recommendations: hover vs BMAT  PT Referral Recommendations: Home care    History of Present Admission: Per chart: Jeffrey Reese is a  42 y.o. male with a history significant for prior suicide attempts, unspecified mood disorder, and polysubstance use disorder who presented after being found unresponsive with encephalopathy, acute kidney injury, hyperkalemia, and rhabdomyolysis initially requiring MICU admission.      Past Medical History:   Diagnosis Date    Alcoholism     Bipolar disorder     Hypertension         Past Surgical History:   Procedure Laterality Date    TONSILLECTOMY      TYMPANOPLASTY         Personal factors affecting treatment/recovery:  None identified    Comorbidities affecting treatment/recovery:  none noted    Clinical presentation:  stable    Patient complexity:    low level as indicated by above stability of condition, personal factors, environmental factors and comorbidities in addition to their impairments found on physical exam.       07/01/22 0900   Prior Living    Prior Living Situation Reported by patient   Lives With Family   Type of Home 2 Story Home   # Steps to Enter Home 2   # Rails to [employer] Home 0   # Of Steps In Home 12   # Rails in Home 1   Location of Bedrooms 2nd floor   Location of Bathrooms 1st floor;2nd floor   Current Home Equipment Walker (rolling);Cane (straight)   Additional Comments Lives at home with mother and roommate, states that they work but can assist as needed. Pt uses his rollator for longer distances, or if at store will use store scooter, usually due to sciatic nerve pain that  effects his L side more than R.   Prior Function Level   Prior Function Level Reported by patient   Transfers Independent   Transfer Devices None;rollator walker   Walking Independent;Used assistive device;Household distances only;Community distances   Walking assistive devices used None;Rollator walker   Engineer, petroleum   PT Tracking   PT TRACKING PT Assigned   Visit Number   Visit Number [facility]) / Treatment Day (HH) 1   Visit Details [facility])   Visit Type [facility]) Evaluation   Precautions/Observations   Precautions used Yes   Fall Precautions General falls precautions   Activity Orders Present Yes   LDA Observation IV lines;Foley cath   Vital Signs Response with Therapy NAD   Was patient wearing a mask? No   PPE worn by Clinical research associate [facility]   Patient Subjective feeling okay , his R foot is hurting more today, and overall feels very tired.   Current Pain Assessment   Pain Assessment / Reassessment Assessment   Pain Scale 0-10 (Numeric Scale for Pain Intensity)    0-10 Scale 9   Pain Comments pain in R foot along medial border extending to heel   Vision    Current Vision Adequate for PT session   Additional Comments glasses donned   Communication   Communication Communication Style   Communication Style Verbal   Cognition  Cognition Tested   Arousal/Alertness Appropriate responses to stimuli   Orientation A&Ox3   Ability to Follow Instructions Follows all commands and directions without difficulty   Type of Instructions Given Verbal   UE Assessment   Assessment Focus Strength   LUE Strength   Overall Strength WFL assessed within functional activities   RUE Strength   Overall Strength WFL assessed within functional activities   LE Assessment   Assessment Focus Strength   LLE Strength   Overall Strength WFL assessed within functional activities   RLE Strength   Overall Strength WFL assessed within functional activities   Bed Mobility   Bed mobility Tested   Supine to Sit Minimum assist;Head of bed elevated;Side  rails down   Sit to Supine Contact guard;Head of bed flat;Side rails down   Additional comments Needing min assist with trunk in order to come into an upright position at the edge of the bed, able to move LE's laterally, but needs CG assist to approximate back to surface when returning to supine   Transfers   Transfers Tested   Sit to Stand Contact guard   Stand to sit Contact guard   Transfer Assistive Device rollator walker   Additional comments Pt rising from edge of bed with Rollator for assist, in standing pt placing majority of weight through L foot and is unable to progress over to R foot due to pain, when attempting pt with R LOB but able to self correct with support from walker, no further mobility tested due to pain.   Training and Education   Patient role of acute PT, importance of all mobility and d/c planning   Balance   Balance Tested   Sitting - Static Standby assist   Sitting - Dynamic Standby assist   Standing - Static Contact guard;Supported   Standing - Teaching laboratory technician guard;Supported   Additional Comments using rollator   Functional Outcome Measures   Functional Outcome Measures Yes   PT AM-PAC Mobility   Turning over in bed? 3   Moving from lying on back to sitting on the side of the bed? 3   Moving to and from a bed to a chair? 1   Sitting down on and standing up from a chair with arms? 3   Need to walk in hospital room? 1   Climbing 3 - 5 steps with a railing? 1   Total Raw Score 12   AM-PAC T-Scale Score 32.23   Assessment   Brief Assessment Appropriate for skilled therapy   Problem List Impaired functional status;Impaired ambulation;Impaired mobility;Impaired bed mobility;Impaired stair navigation;Pain contributing to impairment;Impaired functional mobility;Impaired endurance   Patient / Family Goal to get better   Overall Assessment Pain currently main limitation with mobility, anticipate to progress with medical readiness and additional acute PT in order to return home    Plan/Recommendation   PT Treatment Interventions Assess functional mobility;AROM;PROM;Positioning;Bed mobility training;Transfers training;Stair training;Gait training;Home exercise program instruction;D/C planning;Will work to minimize pain while promoting mobility whenever possible   PT Frequency 3-5 x/wk   PT Positioning Recommendations OOBTC   PT Mobility Recommendations hover vs BMAT   PT Referral Recommendations Home care   PT Discharge Recommendations Anticipate return to prior living arrangement;Intermittent supervision/assist;Home PT   PT Discharge Equipment Recommended To be determined   PT Assessment/Recommendations Reviewed With: Nursing;Patient   Next PT Visit trial ambulation as able, progress and promote independence with all mobility   PT needs to see patient prior to DC  Yes  Time Calculation   Total Time Therapeutic Activities (minutes) 0   Total Time Gait Training (minutes) 0   Total Time Therapeutic Exercises (minutes) 0   Total Time Neuromuscular Re-education (minutes) 0   Total Time Group Therapy (minutes) 0   PT Timed Codes 0   PT Untimed Codes 18   PT Unbilled Time 0   PT Total Treatment 18   Plan and Onset date   Plan of Care Date 07/01/22   Onset Date 06/29/22   Treatment Start Date 07/01/22   Levert Feinstein, PT, DPT    Please contact physical therapist on the treatment team via secure chat or via the secure chat group for your unit Physical Therapist with any questions.    On weekends, please utilize the secure chat group, [facility] 1st call, to contact PT.

## 2022-07-01 NOTE — Progress Notes (Signed)
Pt arrived on the unit at approximately 1:30 am in stable condition. He is alert and oriented times three. He appears to be lethargic. Vitals stable. No acute distress noted. Pt oriented to the unit. Fluids given. Continuous fluids infusing. Personal possessions accounted for. Safety checks done. Will continue to monitor.

## 2022-07-01 NOTE — Plan of Care (Signed)
Problem: Impaired Bed Mobility  Goal: STG - IMPROVE BED MOBILITY  Note: Patient will perform bed mobility without rails and the head of bed flat with No assist (Independently)     Time frame: 7-10 Visits     Problem: Impaired Transfers  Goal: STG - IMPROVE TRANSFERS  Note: Patient will complete Sit to stand transfers using least restrictive assistive device with Modified independence     Time frame: 7-10 Visits     Problem: Impaired Ambulation  Goal: STG - IMPROVE AMBULATION  Note: Patient will ambulate greater than 300 feet using least restrictive assistive device with Modified independence    Time frame: 7-10 Visits       Problem: Impaired Stair Navigation  Goal: STG - IMPROVE STAIR NAVIGATION  Note: Patient will navigate less than 4 steps with 1  rail(s) and least restrictive assistive device and Modified independence     Time frame: 7-10 Visits   Riaz Onorato, PT, DPT    Please contact physical therapist on the treatment team via secure chat or via the secure chat group for your unit Physical Therapist with any questions.    On weekends, please utilize the secure chat group, [facility] 1st call, to contact PT.

## 2022-07-01 NOTE — Provider Consult (Signed)
Psychiatry Consult Service - Followup Note    Interim Events:  Moved from ICU to the floor.     Subjective:  Pt seen and interviewed. States he still does not remember what happened at home and is unsure how he got to the hospital or why. Pt reports he just got back from vacation on Tuesday of last week and returned to work on Wednesday. That evening, he was drinking and thinks he had an argument with his mother and the next thing he recalls is that he woke up in the hospital. Pt wonders if he'll be allowed to return home as he thinks his mother was upset by the way he has been leading his life. He has not talked to her about events leading to the hospitalization. Pt is unsure if he took too much medication, but at this time denies SI, plan or intent. He states he is open to alcohol treatment, possibly at the [place], but will not go to any 12 step programs ([facility]). He is turned off by the religious overtones. He is not interested in an inpatient program because it will interfere with work. He is not sure if he can even go to an outpt program because of his work schedule. He seemed interested in talking with the [facility] here in the hospital regarding options. States prior to admission, his mood was good and he denied having any suicidal thoughts. Denies changes in sleep or appetite. He currently denies SI, plan or intent. He has poor insight with regard to the effect alcohol has on his mood and the need to maintain sobriety.  Of note, pt has a history of three prior psychiatric admissions (2022 x 2, 2021) when he was in [place]. All the overdoses were in the setting of alcohol intoxication.       Mental Status Exam:  Appearance: obese male, sitting up in bed, NAD  Attitude/Behavior: guarded  Motor Activity: normal  Eye Contact: fair  Speech: normal rate, rhythm and volume  Mood: irritable  Affect: irritable  Thought Process: logical, coherent and goal directed   Thought Content: denies  SI/HI/delusions/paranoia  Perception: denies AH/VH  Sensorium/Orientation: alert and oriented  Attention: intact  Memory: fair  Insight: marginal  Judgement: marginal    Current Medications:   gabapentin  600 mg Oral TID    doxycycline IV  100 mg Intravenous Q12H    thiamine  500 mg Intravenous Daily    cefTRIAXone  1,000 mg Intravenous Q24H    folic acid  1 mg Intravenous Daily    acetaminophen  1,000 mg Oral TID    Lidocaine  1 patch Transdermal Q24H    nicotine  1 patch Transdermal Daily    enoxaparin  40 mg Subcutaneous Daily @ 2100      Juice  120 mL Oral PRN    And    dextrose  15 g of Glucose Oral PRN    And    dextrose  25 g Intravenous PRN    And    glucagon  1 mg Intramuscular PRN    sodium chloride  0-500 mL/hr Intravenous PRN    dextrose  0-500 mL/hr Intravenous PRN    sodium chloride  0-500 mL/hr Intravenous PRN    dextrose  0-500 mL/hr Intravenous PRN    albuterol  2.5 mg Nebulization Q4H PRN       Vitals:  Vitals:    07/01/22 0113 07/01/22 0600 07/01/22 0809 07/01/22 1107   BP: 112/65 119/76 129/82  125/74   BP Location:   Right arm Right arm   Pulse: 100 92 93 97   Resp: 19 19 17 18    Temp: 36.2 C (97.2 F) 36.2 C (97.2 F) 36.6 C (97.9 F) 36.5 C (97.7 F)   TempSrc: Temporal Temporal Temporal Temporal   SpO2: 97% 97% 95% 93%   Weight:       Height:           Labs/EKG/Imaging:  Recent Results (from the past 24 hour(s))   CK    Collection Time: 06/30/22 11:21 PM   Result Value Ref Range    CK 6,697 (H) 39 - 308 U/L   CBC and differential    Collection Time: 06/30/22 11:21 PM   Result Value Ref Range    WBC 8.9 3.5 - 11.0 THOU/uL    RBC 4.3 4.0 - 6.0 MIL/uL    Hemoglobin 13.3 12.0 - 17.0 g/dL    Hematocrit 40 37 - 52 %    MCV 94 75 - 100 fL    MCH 31 26 - 32 pg    MCHC 33 32 - 37 g/dL    RDW 28.4 0.0 - 13.2 %    Platelets 220 150 - 450 THOU/uL    Seg Neut % 58.0 %    Lymphocyte % 29.5 %    Monocyte % 9.9 %    Eosinophil % 1.7 %    Basophil % 0.6 %    Neut # K/uL 5.1 1.5 - 6.5 THOU/uL    Lymph #  K/uL 2.6 1.0 - 5.0 THOU/uL    Mono # K/uL 0.9 0.1 - 1.0 THOU/uL    Eos # K/uL 0.2 0.0 - 0.5 THOU/uL    Baso # K/uL 0.1 0.0 - 0.2 THOU/uL    Nucl RBC % 0.0 0.0 - 0.2 /100 WBC    Nucl RBC # K/uL 0.0 0.0 - 0.0 THOU/uL    IMM Granulocytes # 0.0 0.0 - 0.0 THOU/uL    IMM Granulocytes 0.3 %   Magnesium    Collection Time: 06/30/22 11:21 PM   Result Value Ref Range    Magnesium 2.3 1.6 - 2.5 mg/dL   Phosphorus    Collection Time: 06/30/22 11:21 PM   Result Value Ref Range    Phosphorus 3.4 2.7 - 4.5 mg/dL   Basic metabolic panel    Collection Time: 06/30/22 11:21 PM   Result Value Ref Range    Glucose 100 (H) 60 - 99 mg/dL    Sodium 440 102 - 725 mmol/L    Potassium 4.2 3.3 - 5.1 mmol/L    Chloride 103 96 - 108 mmol/L    CO2 26 20 - 28 mmol/L    Anion Gap 10 7 - 16    UN 14 6 - 20 mg/dL    Creatinine 3.66 4.40 - 1.17 mg/dL    eGFR BY CREAT 347 *    Calcium 8.5 (L) 9.0 - 10.3 mg/dL   Hepatic function panel    Collection Time: 06/30/22 11:21 PM   Result Value Ref Range    Total Protein 4.9 (L) 6.3 - 7.7 g/dL    Albumin 3.3 (L) 3.5 - 5.2 g/dL    Bilirubin,Total 0.3 0.0 - 1.2 mg/dL    Bili,Indirect see below 0.1 - 1.0 mg/dL    Bilirubin,Direct <4.2 0.0 - 0.3 mg/dL    Alk Phos 67 40 - 595 U/L    AST 246 (H) 0 - 50 U/L  ALT 97 (H) 0 - 50 U/L   Unable to Perform Add-On Testing    Collection Time: 06/30/22 11:50 PM   Result Value Ref Range    Unable to Perform Add-on Testing SEE COMMENT    POCT glucose    Collection Time: 07/01/22  5:45 AM   Result Value Ref Range    Glucose POCT 91 60 - 99 mg/dL   Ammonia    Collection Time: 07/01/22  8:33 AM   Result Value Ref Range    Ammonia 55 (H) 10 - 47 umol/L        Psychiatric Additional Assessments:  None      Impression:  Jeffrey Reese is a 42 y.o. male  with history of HTN, ETOH use disorder, and unspecified mood disorder who is admitted following overdose with ETOH and medications of unknown type/quantity and intent. Patient's history is consistent with longstanding ETOH disorder  with recurrent relapse following CD treatment, currently in the pre-contemplative phase with regard to future sobriety. He demonstrates significantly poor insight and judgment into his use and the severity with which he has presented to the hospital as a consequence of his use. He is adamant that he was not nor is he now suicidal. He is willing to talk with the hospital [facility] regarding treatment options.       Recommendations:  Check B12.  Pt is not psychiatrically cleared for discharge. May not leave AMA.  3.   Continue home medications.  4.   Will contact Marylee Floras, CASAC to see pt on Monday.  5.  [facility] will follow with you. Please call [phone] with questions (after hours, holidays and weekends please call CPEP at [phone]).       I personally spent 45 minutes on this calendar day including seeing the patient and preparing for this encounter including pre and post visit work.    Jefm Petty, NP

## 2022-07-01 NOTE — Plan of Care (Signed)
Hospital Medicine Progress Note           Length of Stay:  LOS: 2 days   Code status: Full Code     Summary of Hospital Course   Patient was initially admitted to hospital medicine after presenting on 06/29/22 with unresponsiveness, acute kidney injury, hyperkalemia, and rhabdomyolysis. Toxicology was consulted due to concern for polypharmacy overdose, as well as EtOH withdrawal. He was ultimately admitted to the ICU due to worsening encephalopathy and increased monitoring needs. He was started on CTX and doxycycline for concern for aspiration/CAP as well. His encephalopathy, renal function, and rhabdomyolysis improved steadily over time with supportive care, and he was able to quickly be called back out to the floor.     Subjective   - Sleeping comfortably in bed when I saw him early this morning.     Vitals and Physical Exam   Temp:  [36.2 C (97.2 F)-37.2 C (99 F)] 36.2 C (97.2 F)  Heart Rate:  [96-108] 100  Resp:  [12-32] 19  BP: (90-114)/(64-76) 112/65    General: No acute distress. Sleeping comfortably. Not diaphoretic.   Pulm: Normal work of breathing on room air  Neuro: sleeping comfortably    Notable Laboratory Data   - CK downtrending from 20917 on 06/29/22 now to 6697.   - Cr 0.89, back to baseline  - CBC normal    Notable Imaging/Additional Testing   - XR R foot unremarkable  - RUQ US showing hepatic steatosis    Impression and Plan   Patient is a 42 y.o. male with PMHx notable for prior suicide attempts, unspecified mood disorder, and polysubstance use disorder who presented after being found unresponsive with encephalopathy, acute kidney injury, hyperkalemia, and rhabdomyolysis initially requiring MICU admission. Toxicology was consulted due to concern for polypharmacy overdose, and the patient has been improving with supportive care. He was called out of the MICU overnight to hospital medicine, with resolution of his acute kidney injury and continued downtrending of his CK.      #Rhabdomyolysis  #Acute kidney injury c/b hyperkalemia, resolved  - CK downtrending, ok to stop monitoring  - Toxicology consulted, appreciate assistance  - Daily BMP    #Concern for multi-substance ingestion no clear toxidrome; home medications included bupropion, depakote, duloxetine, gabapentin, and trazodone  #Hx suicide attempt, mood disorder  #Polysubstance use disorder  - Seen by Toxicology, appreciate assistance  - Psychiatry following for safety evaluation -- no evidence that pt at risk of harming himself, but not cleared for discharge  - Thiamine 500mg  IV daily  - Folic acid 1mg  IV daily  - Per Psych, restart home valproic acid 500mg  BID when liver function improves, as well as home gabapentin 600mg  TID    #concern for aspiration/CAP  - Started on empiric ceftriaxone and doxycycline    #HTN  - holding home antihypertensives, can likely resume in coming days (BP currently normal)      F: PO  E: daily BMP  N: regular    DVT ppx: lovenox    Dispo: pending clinical improvement, Psych safety eval, likely 2-3 days    Code Status: full code      Fayne Mediate, MD  Internal Medicine, PGY-3

## 2022-07-01 NOTE — Progress Notes (Signed)
HOSPITAL MEDICINE PROGRESS NOTE   LOS: 2     24h Events     No significant 24 hour events. Called out from the MICU.       Subjective     This morning he feels pretty well. He says that he feels achy especially in his RLE which he says has improved some throughout his hospitalization. He denies any shortness of breath, chest pain, abdominal pain, dizziness, or confusion. He does not recall the events leading up to his hospitalization. He only remembers going about his normal daily activities. He is quite vague in his history and says that he does not recall many details.       Objective     Temp:  [36.2 C (97.2 F)-37.1 C (98.8 F)] 36.2 C (97.2 F)  Heart Rate:  [92-108] 92  Resp:  [12-20] 19  BP: (96-119)/(64-76) 119/76  O2 Flow Rate: (S) 2 L/min (06/30/22 0958)    General: No acute distress, lying comfortably in bed  HEENT: PERRL, extraocular movements intact, oropharynx moist  CV: RRR, no murmurs/rubs/gallops  Pulm: Normal work of breathing, CTA, No crackles, wheezing, rhonchi  Abdomen: Soft, non-distended, non-tender, no rebound tenderness, bowel sounds active  Extremities: Warm, 2+ peripheral pulses, some pitting edema noted in the RLE disproportionate form the LLE  Skin: No wounds or rashes noted on exposed skin  Neuro: A&O to person, place and time, face symmetric, speech/language WNL, moving all extremities      Recent pertinent labs:    Creatinine: 0.89  CK: 6697 (>20,000 on admission)  AST 246 (384)  ALT 97 (123)  Ammonia: 55    Recent pertinent imaging:    None          Assessment     Julius Windland is a  42 y.o. male with a history significant for prior suicide attempts, unspecified mood disorder, and polysubstance use disorder who presented after being found unresponsive with encephalopathy, acute kidney injury, hyperkalemia, and rhabdomyolysis initially requiring MICU admission. Toxicology was consulted due to concern for polypharmacy overdose, and the patient has been improving with supportive  care. He was called out of the MICU overnight to hospital medicine, with resolution of his acute kidney injury and continued downtrending of his CK. Psychiatry has also seen him and does not feel that he was a risk to harm himself and the 1:1 monitoring was discontinued. However, they still have concerns about his safety and do not feel that he can safely leave at this time.         Plan   #Rhabdomyolysis  #Acute kidney injury c/b hyperkalemia, resolved  - CK downtrending, ok to stop monitoring  - Toxicology consulted, appreciate assistance  - Daily BMP    RLE swelling, pain  - x-ray negative  - [place] with dopplers  - PT eval and treat     #Concern for multi-substance ingestion no clear toxidrome; home medications included bupropion, depakote, duloxetine, gabapentin, and trazodone  #Hx suicide attempt, mood disorder  #Polysubstance use disorder  - Seen by Toxicology, appreciate assistance  - Psychiatry following for safety evaluation -- no evidence that pt at risk of harming himself, but not cleared for discharge  - 1:1 discontinued  - Thiamine 500mg  IV daily  - Folic acid 1mg  IV daily  - Per Psych, restart home valproic acid 500mg  BID when liver function improves, will recheck ammonia and LFTs tomorrow (1/7)  - Checking ammonia this AM  - Can restart home gabapentin 600mg  TID given  resolution of acute kidney injury   - Will defer restarting home buproprion and duloxetine per psychiatry recommendation  - Last drink was on 1/3     #concern for aspiration/CAP  - Started on empiric ceftriaxone and doxycycline     #HTN  - holding home antihypertensives, can likely resume in coming days (BP currently normal)    Tobacco use disorder  - Nicotine patch     F: PO  E: daily BMP  N: regular     DVT ppx: lovenox     Dispo: pending clinical improvement, Psych safety eval, likely 2-3 days  Appointments needed: PCP        Recardo Evangelist, MD

## 2022-07-02 LAB — COMPREHENSIVE METABOLIC PANEL
ALT: 103 U/L — ABNORMAL HIGH (ref 0–50)
Albumin: 3.4 g/dL — ABNORMAL LOW (ref 3.5–5.2)
Alk Phos: 83 U/L (ref 40–130)
Anion Gap: 12 (ref 7–16)
Bilirubin,Total: 0.2 mg/dL (ref 0.0–1.2)
CO2: 25 mmol/L (ref 20–28)
Calcium: 8.8 mg/dL — ABNORMAL LOW (ref 9.0–10.3)
Chloride: 104 mmol/L (ref 96–108)
Creatinine: 0.92 mg/dL (ref 0.67–1.17)
Glucose: 118 mg/dL — ABNORMAL HIGH (ref 60–99)
Lab: 16 mg/dL (ref 6–20)
Potassium: 4.4 mmol/L (ref 3.3–5.1)
Sodium: 141 mmol/L (ref 133–145)
Total Protein: 5.7 g/dL — ABNORMAL LOW (ref 6.3–7.7)
eGFR BY CREAT: 107 *

## 2022-07-02 LAB — CBC AND DIFFERENTIAL
Baso # K/uL: 0.1 10*3/uL (ref 0.0–0.2)
Basophil %: 0.7 %
Eos # K/uL: 0.2 10*3/uL (ref 0.0–0.5)
Eosinophil %: 2.3 %
Hematocrit: 42 % (ref 37–52)
Hemoglobin: 13.9 g/dL (ref 12.0–17.0)
IMM Granulocytes #: 0 10*3/uL (ref 0.0–0.0)
IMM Granulocytes: 0.4 %
Lymph # K/uL: 2.5 10*3/uL (ref 1.0–5.0)
Lymphocyte %: 36 %
MCH: 31 pg (ref 26–32)
MCHC: 33 g/dL (ref 32–37)
MCV: 94 fL (ref 75–100)
Mono # K/uL: 0.8 10*3/uL (ref 0.1–1.0)
Monocyte %: 11.1 %
Neut # K/uL: 3.5 10*3/uL (ref 1.5–6.5)
Nucl RBC # K/uL: 0 10*3/uL (ref 0.0–0.0)
Nucl RBC %: 0 /100 WBC (ref 0.0–0.2)
Platelets: 239 10*3/uL (ref 150–450)
RBC: 4.4 MIL/uL (ref 4.0–6.0)
RDW: 13 % (ref 0.0–15.0)
Seg Neut %: 49.5 %
WBC: 7.1 10*3/uL (ref 3.5–11.0)

## 2022-07-02 LAB — MAGNESIUM: Magnesium: 2.2 mg/dL (ref 1.6–2.5)

## 2022-07-02 LAB — PHOSPHORUS: Phosphorus: 4.5 mg/dL (ref 2.7–4.5)

## 2022-07-02 LAB — AMMONIA: Ammonia: 37 umol/L (ref 10–47)

## 2022-07-02 LAB — VITAMIN B12: Vitamin B12: 337 pg/mL (ref 232–1245)

## 2022-07-02 LAB — POCT GLUCOSE: Glucose POCT: 113 mg/dL — ABNORMAL HIGH (ref 60–99)

## 2022-07-02 MED ORDER — THIAMINE HCL 100 MG PO TABS *WRAPPED*
100.0000 mg | ORAL_TABLET | Freq: Every day | ORAL | Status: DC
Start: 2022-07-03 — End: 2022-07-03
  Administered 2022-07-03: 100 mg via ORAL
  Filled 2022-07-02: qty 1

## 2022-07-02 MED ORDER — FOLIC ACID 1 MG PO TABS *I*
1.0000 mg | ORAL_TABLET | Freq: Every day | ORAL | Status: DC
Start: 2022-07-02 — End: 2022-07-03
  Administered 2022-07-02 – 2022-07-03 (×2): 1 mg via ORAL
  Filled 2022-07-02 (×2): qty 1

## 2022-07-02 MED ORDER — DOXYCYCLINE HYCLATE 100 MG PO TABS *I*
100.0000 mg | ORAL_TABLET | Freq: Two times a day (BID) | ORAL | Status: DC
Start: 2022-07-02 — End: 2022-07-03
  Administered 2022-07-02 – 2022-07-03 (×2): 100 mg via ORAL
  Filled 2022-07-02 (×3): qty 1

## 2022-07-02 NOTE — Progress Notes (Signed)
Pt refused wrap to R ankle

## 2022-07-02 NOTE — Progress Notes (Signed)
HOSPITAL MEDICINE PROGRESS NOTE   LOS: 3     24h Events     NAEO.        Subjective     He reports feeling well. Did endorse some R ankle pain with ROM that was present prior to admission. He cannot recall if he has had any trauma to it. No other issues.      Objective     Temp:  [36.4 C (97.5 F)-36.6 C (97.9 F)] 36.4 C (97.5 F)  Heart Rate:  [93-102] 102  Resp:  [17-18] 18  BP: (125-138)/(74-82) 127/80       General: No acute distress, lying comfortably in bed  HEENT: PERRL, extraocular movements intact, oropharynx moist  CV: RRR, no murmurs/rubs/gallops  Pulm: Normal work of breathing, CTA, No crackles, wheezing, rhonchi  Abdomen: Soft, non-distended, non-tender, no rebound tenderness, bowel sounds active  Extremities: Warm, 2+ peripheral pulses, some pitting edema noted in the RLE disproportionate form the LLE. Right ankle swelling and erythema.  Skin: No wounds or rashes noted on exposed skin  Neuro: A&O to person, place and time, face symmetric, speech/language WNL, moving all extremities      Recent pertinent labs:    Creatinine: 0.89  CK: 6697 (>20,000 on admission)  AST cancelled (246)   ALT 103 (97)   Ammonia: 37 (55)    Recent pertinent imaging:    None          Assessment     Jeffrey Reese is a  42 y.o. male with a history significant for prior suicide attempts, unspecified mood disorder, and polysubstance use disorder who presented after being found unresponsive with encephalopathy, acute kidney injury, hyperkalemia, and rhabdomyolysis initially requiring MICU admission. Toxicology was consulted due to concern for polypharmacy overdose, and the patient has been improving with supportive care. He was called out of the MICU overnight to hospital medicine, with resolution of his acute kidney injury and continued downtrending of his CK. Psychiatry has also seen him and does not feel that he was a risk to harm himself and the 1:1 monitoring was discontinued. However, they still have concerns about  his safety and do not feel that he can safely leave at this time.         Plan   #Rhabdomyolysis  #Acute kidney injury c/b hyperkalemia, resolved  - CK downtrending, ok to stop monitoring  - Toxicology consulted, appreciate assistance  - Daily BMP    RLE swelling, pain  - ankle x-ray negative  - [place] with dopplers neg for DVT  - PT eval and treat  - RICE therapy      #Concern for multi-substance ingestion no clear toxidrome; home medications included bupropion, depakote, duloxetine, gabapentin, and trazodone  #Hx suicide attempt, mood disorder  #Polysubstance use disorder  - Seen by Toxicology, appreciate assistance  - Psychiatry following for safety evaluation -- no evidence that pt at risk of harming himself, but not cleared for discharge  - 1:1 discontinued  - Thiamine 500mg  IV daily  - Folic acid 1mg  IV daily  - Per Psych, restart home valproic acid 500mg  BID when liver function improves, continue to hold for now due to elev liver enzymes  - Can restart home gabapentin 600mg  TID given resolution of acute kidney injury   - Will defer restarting home buproprion and duloxetine per psychiatry recommendation  - Last drink was on 1/3, will keep CIWA for one more day      #concern for aspiration/CAP  - Started  on empiric ceftriaxone and doxycycline     #HTN  - holding home antihypertensives, can likely resume in coming days (BP currently normal)    #Tobacco use disorder  - Nicotine patch     F: PO  E: daily CMP  N: regular     DVT ppx: lovenox     Dispo: pending clinical improvement, Psych safety eval, likely 2-3 days  Appointments needed: PCP        Rachell Cipro, MD

## 2022-07-02 NOTE — Plan of Care (Signed)
Problem: Safety  Goal: Patient will remain free of falls  Outcome: Progressing towards goal     Problem: Pain/Comfort  Goal: Patient's pain or discomfort is manageable  Outcome: Progressing towards goal     Problem: Nutrition  Goal: Patient's nutritional status is maintained or improved  Outcome: Progressing towards goal     Problem: Mobility  Goal: Patient's functional status is maintained or improved  Outcome: Progressing towards goal     Problem: Psychosocial  Goal: Demonstrates ability to cope with illness  Outcome: Progressing towards goal     Problem: Cognitive function  Goal: Cognitive function will be maintained or return to baseline  Description: Interventions:  Delirium Assessment  LIVEBAR Assessment    Outcome: Progressing towards goal

## 2022-07-02 NOTE — Plan of Care (Signed)
Notified by nursing that IV infiltrated from doxy. Switched patient to oral doxycycline, thiamine, and folic acid. He was getting CTX and doxy for empiric CAP treatment, leukocytosis has normalized, he is breathing well on RA, and has been afebrile.    Rachell Cipro, MD

## 2022-07-03 ENCOUNTER — Other Ambulatory Visit: Payer: Self-pay

## 2022-07-03 DIAGNOSIS — T1491XA Suicide attempt, initial encounter: Secondary | ICD-10-CM

## 2022-07-03 DIAGNOSIS — T50904A Poisoning by unspecified drugs, medicaments and biological substances, undetermined, initial encounter: Secondary | ICD-10-CM

## 2022-07-03 LAB — CBC AND DIFFERENTIAL
Baso # K/uL: 0.1 10*3/uL (ref 0.0–0.2)
Basophil %: 0.8 %
Eos # K/uL: 0.2 10*3/uL (ref 0.0–0.5)
Eosinophil %: 2.6 %
Hematocrit: 43 % (ref 37–52)
Hemoglobin: 14.3 g/dL (ref 12.0–17.0)
IMM Granulocytes #: 0 10*3/uL (ref 0.0–0.0)
IMM Granulocytes: 0.5 %
Lymph # K/uL: 2.5 10*3/uL (ref 1.0–5.0)
Lymphocyte %: 32.5 %
MCH: 31 pg (ref 26–32)
MCHC: 33 g/dL (ref 32–37)
MCV: 95 fL (ref 75–100)
Mono # K/uL: 0.8 10*3/uL (ref 0.1–1.0)
Monocyte %: 9.8 %
Neut # K/uL: 4.2 10*3/uL (ref 1.5–6.5)
Nucl RBC # K/uL: 0 10*3/uL (ref 0.0–0.0)
Nucl RBC %: 0 /100 WBC (ref 0.0–0.2)
Platelets: 252 10*3/uL (ref 150–450)
RBC: 4.6 MIL/uL (ref 4.0–6.0)
RDW: 12.4 % (ref 0.0–15.0)
Seg Neut %: 53.8 %
WBC: 7.8 10*3/uL (ref 3.5–11.0)

## 2022-07-03 LAB — COMPREHENSIVE METABOLIC PANEL
ALT: 113 U/L — ABNORMAL HIGH (ref 0–50)
AST: 185 U/L — ABNORMAL HIGH (ref 0–50)
Albumin: 3.8 g/dL (ref 3.5–5.2)
Alk Phos: 103 U/L (ref 40–130)
Anion Gap: 11 (ref 7–16)
Bilirubin,Total: 0.2 mg/dL (ref 0.0–1.2)
CO2: 26 mmol/L (ref 20–28)
Calcium: 9.3 mg/dL (ref 9.0–10.3)
Chloride: 103 mmol/L (ref 96–108)
Creatinine: 0.91 mg/dL (ref 0.67–1.17)
Glucose: 131 mg/dL — ABNORMAL HIGH (ref 60–99)
Lab: 14 mg/dL (ref 6–20)
Potassium: 4.4 mmol/L (ref 3.3–5.1)
Sodium: 140 mmol/L (ref 133–145)
Total Protein: 5.8 g/dL — ABNORMAL LOW (ref 6.3–7.7)
eGFR BY CREAT: 108 *

## 2022-07-03 LAB — POCT GLUCOSE: Glucose POCT: 121 mg/dL — ABNORMAL HIGH (ref 60–99)

## 2022-07-03 LAB — MAGNESIUM: Magnesium: 2.1 mg/dL (ref 1.6–2.5)

## 2022-07-03 LAB — BENZODIAZEPINE, CONFIRMATION, URINE: Confirm BZD: POSITIVE

## 2022-07-03 LAB — CONFIRM OPIATES: Confirm Opiates: POSITIVE

## 2022-07-03 LAB — PHOSPHORUS: Phosphorus: 5.2 mg/dL — ABNORMAL HIGH (ref 2.7–4.5)

## 2022-07-03 MED ORDER — DOXYCYCLINE HYCLATE 100 MG PO TABS *I*
100.0000 mg | ORAL_TABLET | Freq: Two times a day (BID) | ORAL | 0 refills | Status: AC
Start: 2022-07-03 — End: 2022-07-04
  Filled 2022-07-03: qty 2, 1d supply, fill #0

## 2022-07-03 MED ORDER — AMLODIPINE BESYLATE 10 MG PO TABS *I*
10.0000 mg | ORAL_TABLET | Freq: Every day | ORAL | Status: DC
Start: 2022-07-03 — End: 2022-07-03
  Administered 2022-07-03: 10 mg via ORAL
  Filled 2022-07-03: qty 1

## 2022-07-03 MED ORDER — VITAMIN B-12 100 MCG PO TABS *I*
100.0000 ug | ORAL_TABLET | Freq: Every day | ORAL | 0 refills | Status: AC
Start: 2022-07-03 — End: 2022-08-02
  Filled 2022-07-03: qty 30, 30d supply, fill #0

## 2022-07-03 MED ORDER — NALTREXONE HCL 50 MG PO TABS *I*
50.0000 mg | ORAL_TABLET | Freq: Every day | ORAL | Status: DC
Start: 2022-07-03 — End: 2022-07-03
  Filled 2022-07-03: qty 1

## 2022-07-03 MED ORDER — FOLIC ACID 1 MG PO TABS *I*
1.0000 mg | ORAL_TABLET | Freq: Every day | ORAL | 0 refills | Status: AC
Start: 2022-07-04 — End: 2022-08-03
  Filled 2022-07-03: qty 30, 30d supply, fill #0

## 2022-07-03 MED ORDER — NALTREXONE HCL 50 MG PO TABS *I*
50.0000 mg | ORAL_TABLET | Freq: Every day | ORAL | 0 refills | Status: DC
Start: 2022-07-03 — End: 2022-07-03
  Filled 2022-07-03: qty 30, 30d supply, fill #0

## 2022-07-03 MED ORDER — THIAMINE HCL 100 MG PO TABS *WRAPPED*
100.0000 mg | ORAL_TABLET | Freq: Every day | ORAL | 0 refills | Status: AC
Start: 2022-07-04 — End: 2022-08-03
  Filled 2022-07-03: qty 30, 30d supply, fill #0

## 2022-07-03 NOTE — Progress Notes (Signed)
SW met with the patient to discuss ETOH use resources and the patient declined resources from this Clinical research associate at this time.     Karie Chimera, LMSW  Phone: [phone]

## 2022-07-03 NOTE — Provider Consult (Signed)
Psychiatry Consult Service - Followup Note    Interim Events:  Approaching medical readiness for discharge.    Subjective:  Pt seen and interviewed. States he still does not remember what happened at home and is unsure how he got to the hospital. Thinks he was provoked by arguments with his mother about staying with her, which led to his suicide attempt. Loss of job has led him to move in with his mother. He spoke with [facility] earlier today and was appreciative of that consult. He is looking forward to getting his life back on track which involves a stable home. He currently plans on going home and living with his mother. He states that if his mom wants him to leave, she would have to evict him or go through legal channels. He has found good benefit from his current medications and thinks naltrexone has been helpful in preventing intense craving when he is at work. He has tried to replace alcohol with cannabis but has found worsening of anxiety and paranoia. He will be seeing his brother today and is looking forward to going home.    Mental Status Exam:  Appearance: obese male, lying on his side in bed, NAD  Attitude/Behavior: cooperative and understanding  Motor Activity: normal  Eye Contact: fair  Speech: normal rate, rhythm and volume  Mood: euthymic  Affect: normal  Thought Process: logical, coherent and goal directed   Thought Content: denies SI/HI/delusions/paranoia  Perception: denies AH/VH  Sensorium/Orientation: alert and oriented  Attention: intact  Memory: fair  Insight: fair  Judgement: marginal    Current Medications:   doxycycline  100 mg Oral 2 times per day    folic acid  1 mg Oral Daily    thiamine  100 mg Oral Daily    gabapentin  600 mg Oral TID    acetaminophen  1,000 mg Oral TID    Lidocaine  1 patch Transdermal Q24H    nicotine  1 patch Transdermal Daily    enoxaparin  40 mg Subcutaneous Daily @ 2100      Juice  120 mL Oral PRN    And    dextrose  15 g of Glucose Oral PRN    And    dextrose  25 g  Intravenous PRN    And    glucagon  1 mg Intramuscular PRN    sodium chloride  0-500 mL/hr Intravenous PRN    dextrose  0-500 mL/hr Intravenous PRN    sodium chloride  0-500 mL/hr Intravenous PRN    dextrose  0-500 mL/hr Intravenous PRN    albuterol  2.5 mg Nebulization Q4H PRN       Vitals:  Vitals:    07/02/22 2000 07/03/22 0500 07/03/22 0804 07/03/22 1044   BP: (!) 153/91 124/81 (!) 135/102 (!) 152/102   BP Location: Right arm Right arm Right arm Right arm   Pulse: 99 81 86 106   Resp: 18 18 18 18    Temp: 36.6 C (97.9 F) 36.6 C (97.9 F) 36.1 C (96.9 F) 36.6 C (97.9 F)   TempSrc: Temporal Temporal Temporal Temporal   SpO2: 95% 96% 99% 98%   Weight:       Height:           Labs/EKG/Imaging:  Recent Results (from the past 24 hour(s))   CBC and differential    Collection Time: 07/02/22 11:55 PM   Result Value Ref Range    WBC 7.8 3.5 - 11.0 THOU/uL  RBC 4.6 4.0 - 6.0 MIL/uL    Hemoglobin 14.3 12.0 - 17.0 g/dL    Hematocrit 43 37 - 52 %    MCV 95 75 - 100 fL    MCH 31 26 - 32 pg    MCHC 33 32 - 37 g/dL    RDW 16.1 0.0 - 09.6 %    Platelets 252 150 - 450 THOU/uL    Seg Neut % 53.8 %    Lymphocyte % 32.5 %    Monocyte % 9.8 %    Eosinophil % 2.6 %    Basophil % 0.8 %    Neut # K/uL 4.2 1.5 - 6.5 THOU/uL    Lymph # K/uL 2.5 1.0 - 5.0 THOU/uL    Mono # K/uL 0.8 0.1 - 1.0 THOU/uL    Eos # K/uL 0.2 0.0 - 0.5 THOU/uL    Baso # K/uL 0.1 0.0 - 0.2 THOU/uL    Nucl RBC % 0.0 0.0 - 0.2 /100 WBC    Nucl RBC # K/uL 0.0 0.0 - 0.0 THOU/uL    IMM Granulocytes # 0.0 0.0 - 0.0 THOU/uL    IMM Granulocytes 0.5 %   Phosphorus    Collection Time: 07/02/22 11:55 PM   Result Value Ref Range    Phosphorus 5.2 (H) 2.7 - 4.5 mg/dL   Magnesium    Collection Time: 07/02/22 11:55 PM   Result Value Ref Range    Magnesium 2.1 1.6 - 2.5 mg/dL   POCT glucose    Collection Time: 07/03/22  5:06 AM   Result Value Ref Range    Glucose POCT 121 (H) 60 - 99 mg/dL   Comprehensive metabolic panel    Collection Time: 07/03/22  5:12 AM   Result Value  Ref Range    Sodium 140 133 - 145 mmol/L    Potassium 4.4 3.3 - 5.1 mmol/L    Chloride 103 96 - 108 mmol/L    CO2 26 20 - 28 mmol/L    Anion Gap 11 7 - 16    UN 14 6 - 20 mg/dL    Creatinine 0.45 4.09 - 1.17 mg/dL    eGFR BY CREAT 811 *    Glucose 131 (H) 60 - 99 mg/dL    Calcium 9.3 9.0 - 91.4 mg/dL    Total Protein 5.8 (L) 6.3 - 7.7 g/dL    Albumin 3.8 3.5 - 5.2 g/dL    Bilirubin,Total 0.2 0.0 - 1.2 mg/dL    AST 782 (H) 0 - 50 U/L    ALT 113 (H) 0 - 50 U/L    Alk Phos 103 40 - 130 U/L        Psychiatric Additional Assessments:  None      Impression:  Jeffrey Reese is a 42 y.o. male  with history of HTN, ETOH use disorder, and unspecified mood disorder who was admitted following overdose with ETOH and medications of unknown type/quantity and intent. Patient's history is consistent with long-standing ETOH disorder with recurrent relapse following CD treatment, currently in the pre-contemplative phase with regard to future sobriety. He demonstrates improving insight and judgment into his use and the severity with which he has presented to the hospital as a consequence of his use. He is no longer feeling suicidal and is future-oriented about getting his life back on track, which begins by establishing a place to live. He was appreciative of his conversation with CASAC and is looking forward to ongoing management for his alcohol use  disorder in the outpatient setting.      Recommendations:  Pt is psychiatrically cleared for discharge.  Safety plan completed with patient and attached in the discharge instructions for printing and patient to have upon discharge.  3.   Continue home medications. Restart home naltrexone but continue holding valproate until outpatient follow-up after LFT recheck.  4.   PCLS will sign off. Please page [place] back with any questions or concerns.    Patient was seen with attending, Dr. Gwynneth Macleod.    Arnetha Massy, DO  Neurology PGY-4

## 2022-07-03 NOTE — Discharge Summary (Signed)
Name: Jeffrey Reese MRN: [id number] DOB: [birth year]     Admit Date: 06/29/2022   Date of Discharge: 07/03/2022     Patient was accepted for discharge to   Home or Self Care [1]           Discharge Attending Physician: Marjean Donna, MD      Hospitalization Summary    CONCISE NARRATIVE: Hospital Narrative:  Jeffrey Reese is a  42 y.o. male with a history significant for prior suicide attempts, unspecified mood disorder, and polysubstance use disorder who presented after being found unresponsive with encephalopathy, acute kidney injury, hyperkalemia, and rhabdomyolysis initially requiring MICU admission. Toxicology was consulted due to concern for polypharmacy overdose, and the patient has been improving with supportive care. He was called out of the MICU to hospital medicine, with resolution of his acute kidney injury and continued downtrending of his CK. Psychiatry was also consulted and did not feel that he was a risk to harm himself and the 1:1 monitoring was discontinued. A safety plan was developed with the patient, and he was ultimately deemed safe for discharge home.    Post-Hospitalization To-Do/Follow for PCP:  - Discuss outpatient mental health treatment and substance use treatment -- resources offered while inpatient  - Discuss starting naltrexone -- has previously been helpful for him for reducing alcohol cravings, though deferred starting inpatient given recent opioid use (positive on utox)  - Hold home Depakote until LFTs can be rechecked OP, per Psych recommendations      Follow up labs to be ordered by PCP:  - Hepatic function panel, BMP      New medications at discharge:  - Doxycycline 100mg  q12h for one more day to complete empiric CAP treatment  - B12 supplementation per Psychiatry recs      Home medication changes at discharge with rationale:  - Held home Depakote given LFT elevation  - Discontinued medications that have not been filled in over a year (morphine, meloxicam,  lisinopril).      Follow up appointments:  Future Appointments        Jul 25 2022   08:00 AM - Unknown                 Important radiology findings and follow up:  - [place] RLE w/ doppler negative for DVT  - R Foot XR unremarkable  - RUQ US showing hepatic steatosis  - CXR w/ lower lobe opacities, likely atelectasis w/ possible RLL PNA  - CT head unremarkable      US doppler vein RIGHT lower extremity    Result Date: 07/01/2022  No DVT in the right lower extremity. END OF IMPRESSION.       [facility] submits this DICOM format image data and final report to the [facility], an independent secure electronic health information exchange, on a reciprocally searchable basis (with patient authorization) for a minimum of 12 months after exam date.    * Foot RIGHT standard AP, Lateral, Oblique views    Result Date: 06/30/2022  No acute osseous abnormalities appreciated. END OF IMPRESSION       [facility] submits this DICOM format image data and final report to the [facility], an independent secure electronic health information exchange, on a reciprocally searchable basis (with patient authorization) for a minimum of 12 months after exam date.    Portable US abdomen limited single quad    Result Date: 06/30/2022  Hepatic steatosis. END OF IMPRESSION. I have personally  reviewed the images and the Resident's/Fellow's interpretation and agree with or edited the findings.       [facility] submits this DICOM format image data and final report to the [facility], an independent secure electronic health information exchange, on a reciprocally searchable basis (with patient authorization) for a minimum of 12 months after exam date.    *Chest STANDARD single view    Result Date: 06/30/2022  Lower lobe opacities can represent atelectasis, right greater than left. This is minimally improved from prior. Superimposed aspiration or pneumonia in right lower lung cannot be excluded. END OF IMPRESSION I have personally reviewed the images  and the Resident's/Fellow's interpretation and agree with or edited the findings.       [facility] submits this DICOM format image data and final report to the [facility], an independent secure electronic health information exchange, on a reciprocally searchable basis (with patient authorization) for a minimum of 12 months after exam date.    ECG - Adult ED ([place])    Result Date: 06/29/2022  Sinus tachycardia Abnormal R-wave progression, late transition    CT head without contrast    Result Date: 06/29/2022  No acute intracranial abnormality. END OF IMPRESSION I have personally reviewed the images and the Resident's/Fellow's interpretation and agree with or edited the findings.       [facility] submits this DICOM format image data and final report to the [facility], an independent secure electronic health information exchange, on a reciprocally searchable basis (with patient authorization) for a minimum of 12 months after exam date.    *Chest STANDARD single view    Result Date: 06/29/2022  Lower lobe opacities can represent atelectasis, right greater than left. Superimposed aspiration or pneumonia in right lower lung cannot be excluded. Mildly enlarged cardiac silhouette. END OF IMPRESSION I have personally reviewed the images and the Resident's/Fellow's interpretation and agree with or edited the findings.       [facility] submits this DICOM format image data and final report to the [facility], an independent secure electronic health information exchange, on a reciprocally searchable basis (with patient authorization) for a minimum of 12 months after exam date.                            Signed: Noel Journey, MD  On: 07/03/2022  at: 5:30 PM

## 2022-07-03 NOTE — Discharge Instructions (Addendum)
[facility] Safety Plan      Creation Date: 07/03/22 Last Update Date: 07/03/22      Step 1: Warning signs:    Warning Signs    arguments w/family      Step 2: Internal coping strategies - Things I can do to take my mind off my problems without contacting another person:    Strategies    go for a walk    pet the dog      Step 3: People and social settings that provide distraction:    Name Contact Information    [employer]    [facility]      Step 4: People whom I can ask for help during a crisis:    Name Contact Information    Nat Wallman (brother) [phone]      Step 5: Professionals or agencies I can contact during a crisis:    Clinician/Agency Name Phone Emergency Contact    [facility] emergency        Suicide Prevention Lifeline Phone: Call or Text 988  Crisis Text Line: Text HOME to [phone]     Step 6: Making the environment safer (plan for lethal means safety):   Pt denies access to firearms  Pt states he uses pill box and willing to put away extra meds     Optional: What is most important to me and worth living for?:   Living by myself again and having independence     [facility] Safety Plan. Jeanella Cara and Clyda Greener. Manson Passey. Used with permission of the authors.            Brief Summary of Your Hospital Course (including key procedures and diagnostic test results):  You were admitted to the hospital after being found unresponsive and had a kidney injury with concern for muscle breakdown and a high potassium level in your blood. Due to how sick you were, you were admitted to the intensive care unit but quickly improved and were able to be moved to the general medicine floor. You were treated for a pneumonia while in the hospital. We resumed your normal medications with the exception of valproate, which should not be resumed until you follow up with your doctors outside of the hospital. You were also restarted on Naltrexone, which you can continue taking outside the hospital. You were  seen by the Psychiatry team while you were admitted, who helped create a safety plan with you that is attached with your discharge instructions.     Your instructions:  - Remember to follow the safety plan that you created with the Psychiatry team while you were admitted.   - Do not take your Depakote until your doctor outside of the hospital says it is ok to restart taking it.  - We are sending you home with 2 more antibiotic pills to finish a course of treatment for pneumonia -- you can take one tomorrow morning and one tomorrow night.  - We recommend connecting with Outpatient Mental Health treatment and Substance Use Treatment programs -- you can also continue discussing this further with your doctor when you leave the hospital.     What to do after you leave the hospital:    Recommended diet: Regular - No restrictions     Recommended activity: activity as tolerated      If you experience any of these symptoms within the first 24 hours after discharge: significant pain, thoughts of  harming yourself or others, fevers, nausea and vomiting, or inability to walk, please follow up with the discharge attending Dr. Randolph Bing, MD at phone-number: [phone]    If you experience any of the above symptoms 24 hours or more after discharge (significant pain, thoughts of harming yourself or others, fevers, nausea and vomiting, or inability to walk), please follow up with your PCP:  Ermalinda Barrios, MD [phone]

## 2022-07-03 NOTE — Plan of Care (Signed)
Problem: Impaired Bed Mobility  Goal: STG - IMPROVE BED MOBILITY  Outcome: Adequate for discharge     Problem: Impaired Transfers  Goal: STG - IMPROVE TRANSFERS  Outcome: Adequate for discharge     Problem: Impaired Ambulation  Goal: STG - IMPROVE AMBULATION  Outcome: Adequate for discharge     Problem: Impaired Stair Navigation  Goal: STG - IMPROVE STAIR NAVIGATION  Outcome: Adequate for discharge

## 2022-07-03 NOTE — Progress Notes (Signed)
HOSPITAL MEDICINE PROGRESS NOTE   LOS: 4     24h Events     NAEO.        Subjective     This morning he feels well. He is still feeling like there is increased swelling in his RLE that has extended to his thigh now. It is not painful, erythematous, or hot.      Objective     Temp:  [36.3 C (97.3 F)-36.8 C (98.2 F)] 36.6 C (97.9 F)  Heart Rate:  [81-100] 81  Resp:  [16-20] 18  BP: (114-153)/(78-94) 124/81       General: No acute distress, lying comfortably in bed  HEENT: PERRL, extraocular movements intact, oropharynx moist  CV: RRR, no murmurs/rubs/gallops  Pulm: Normal work of breathing, CTA, No crackles, wheezing, rhonchi  Abdomen: Soft, non-distended, non-tender, no rebound tenderness, bowel sounds active  Extremities: Warm, 2+ peripheral pulses, some pitting edema noted in the RLE disproportionate form the LLE. Right ankle swelling and erythema.  Skin: No wounds or rashes noted on exposed skin  Neuro: A&O to person, place and time, face symmetric, speech/language WNL, moving all extremities      Recent pertinent labs:    ALT: 113 (101 on admission)  AST: 185 (425 on admission)    Recent pertinent imaging:    None          Assessment     Jeffrey Reese is a  42 y.o. male with a history significant for prior suicide attempts, unspecified mood disorder, and polysubstance use disorder who presented after being found unresponsive with encephalopathy, acute kidney injury, hyperkalemia, and rhabdomyolysis initially requiring MICU admission. Toxicology was consulted due to concern for polypharmacy overdose, and the patient has been improving with supportive care. He was called out of the MICU overnight to hospital medicine, with resolution of his acute kidney injury and continued downtrending of his CK. Psychiatry has also seen him and does not feel that he was a risk to harm himself and the 1:1 monitoring was discontinued. However, they still have concerns about his safety and do not feel that he can safely  leave at this time.         Plan   #Rhabdomyolysis  #Acute kidney injury c/b hyperkalemia, resolved  - CK downtrending, ok to stop monitoring  - Toxicology consulted, appreciate assistance  - Daily BMP    RLE swelling, pain  - ankle x-ray negative  - [place] with dopplers neg for DVT  - PT eval and treat  - RICE therapy      #Concern for multi-substance ingestion no clear toxidrome; home medications included bupropion, depakote, duloxetine, gabapentin, and trazodone  #Hx suicide attempt, mood disorder  #Polysubstance use disorder  - Seen by Toxicology, appreciate assistance  - Psychiatry following for safety evaluation -- no evidence that pt at risk of harming himself, but not cleared for discharge  - 1:1 discontinued  - Thiamine 500mg  IV daily  - Folic acid 1mg  IV daily  - Per Psych, defer on restarting home VPA until outpatient follow up with LFTs  - Restart naltrexone 50mg   - Can restart home gabapentin 600mg  TID given resolution of acute kidney injury   - Will defer restarting home buproprion and duloxetine per psychiatry recommendation     #concern for aspiration/CAP  - Started on empiric ceftriaxone and doxycycline     #HTN  - Will restart amlodipine 10mg  today  - Restart lisinopril 10mg  tomorrow    #Tobacco use disorder  -  Nicotine patch     F: PO  E: daily CMP  N: regular     DVT ppx: lovenox     Dispo: Pending void trial  Appointments needed: PCP,         Recardo Evangelist, MD

## 2022-07-03 NOTE — Progress Notes (Signed)
Physical Therapy Discharge Note    Therapy Recommendations:  Discharge Recommendations:  Discharge to Planned Living Arrangement with Caregiver Assistance:     Patient's mobility is currently not a barrier to discharge. No further PT visits needed. .   Recommendations:   PT Discharge Equipment Recommended: (P) None   Additional justification:   n/a  PT Positioning Recommendations: (P) OOBTC  PT Mobility Recommendations: (P) Ind  PT Referral Recommendations: (P) Home care     * Physical Therapist to review for discharge planning *    PT Review Required for: Discharge Plan Update       07/03/22 0932   PT Tracking   PT TRACKING PT Assigned   Visit Number   Visit Number [facility]) / Treatment Day [facility]) 2   Visit Details [facility])   Visit Type [facility]) Follow Up-General   Precautions/Observations   Precautions used Yes   Fall Precautions General falls precautions   Activity Orders Present Yes   LDA Observation Foley cath   Vital Signs Response with Therapy VSS   Was patient wearing a mask? No   PPE worn by Clinical research associate [facility]   Patient Subjective "I feel much better. My R hip is stiff"   Current Pain Assessment   Pain Assessment / Reassessment Assessment   Pain Scale 0-10 (Numeric Scale for Pain Intensity)    0-10 Scale 0   Vision    Current Vision Adequate for PT session   Additional Comments Glasses donned.   Communication   Insurance risk surveyor Style Verbal   Cognition   Cognition Tested   Arousal/Alertness Appropriate responses to stimuli   Orientation A&Ox4   Ability to Follow Instructions Follows all commands and directions without difficulty   Type of Instructions Given Verbal   Bed Mobility   Bed mobility Tested   Supine to Sit Independent   Sit to Supine Not tested   Additional comments Patient performs bed mobility with no defecits noted. Sitting at EOB at end of session.   Transfers   Transfers Tested   Sit to Stand Independent   Stand to sit Independent   Transfer Assistive Device none    Additional comments Patient performs 2 sit <> stands throughout with no defecits noted. good use of UE's for push off and LE strength to stand upright.   Mobility   Mobility: Gait/Stairs Tested   Gait Pattern WFL   Ambulation Assist Independent   Ambulation Distance (Feet) ~500   Ambulation Assistive Device None   Stairs Assistance Independent   Stair Management Technique One rail;Alternating pattern;Step to pattern;Forwards   Number of Stairs 10x2   Additional comments Patient ambulates with writer off unit with good balance and endurance throughout. Performs 10x2 steps independently utilizing 1 rail for support alternating patern when ascending and step to during descent. No imbalances noted throughout.   Balance   Balance Tested   Sitting - Static Independent   Sitting - Dynamic Independent   Standing - Static Independent   Standing - Dynamic Independent   Functional Outcome Measures   Functional Outcome Measures Yes   PT AM-PAC Mobility   Turning over in bed? 4   Moving from lying on back to sitting on the side of the bed? 4   Moving to and from a bed to a chair? 4   Sitting down on and standing up from a chair with arms? 4   Need to walk in hospital room? 4   Climbing 3 - 5 steps  with a railing? 4   Total Raw Score 24   AM-PAC T-Scale Score 57.68   Assessment   Brief Assessment Appropriate for skilled therapy   Problem List Impaired functional status;Impaired ambulation;Impaired mobility;Impaired bed mobility;Impaired stair navigation;Pain contributing to impairment;Impaired functional mobility;Impaired endurance   Patient / Family Goal To get back home.   Overall Assessment Patient tolerated treatment session well. Improving towards goals throughotu PT sessions. Cleared from PT standpoint to return home.   Plan/Recommendation   PT Treatment Interventions No further PT interventions   PT Frequency none further   PT Positioning Recommendations OOBTC   PT Mobility Recommendations Ind   PT Referral  Recommendations Home care   PT Discharge Recommendations Anticipate return to prior living arrangement;Intermittent supervision/assist   PT Discharge Equipment Recommended None   Transportation Recommendations Any   PT Assessment/Recommendations Reviewed With: Patient;Nursing;Advanced Practice Provider;Social Worker   Next PT Visit n/a   PT needs to see patient prior to DC  No   Time Calculation   Total Time Therapeutic Activities (minutes) 0   Total Time Gait Training (minutes) 13   Total Time Therapeutic Exercises (minutes) 0   Total Time Neuromuscular Re-education (minutes) 0   Total Time Group Therapy (minutes) 0   PT Timed Codes 13   PT Untimed Codes 0   PT Unbilled Time 0   PT Total Treatment 13   Plan and Onset date   Plan of Care Date   (D/C 07/03/2022)   Onset Date 06/29/22   Treatment Start Date 07/01/22       Adolph Pollack, Physical Therapist Assistant    (Please contact physical therapist on the treatment team via secure chat or via the secure chat group for your unit Physical Therapist (PT) with any questions.  On weekends/holidays, please utilize the secure chat group, [facility] 1st call, to contact PT.)

## 2022-07-04 ENCOUNTER — Telehealth: Payer: Self-pay | Admitting: Psychiatry

## 2022-07-04 NOTE — Telephone Encounter (Signed)
Writer attempted to contact the patient x1 for SUD follow up after hospital discharge with [facility]. Patient did not answer his phone. Writer left a VM with their contact information. Marylee Floras [phone] should the patient want to schedule SUD follow up.

## 2022-07-04 NOTE — Progress Notes (Signed)
SUD-PC CASAC Note       Is this a consult? Yes    Patient Information:    Name: Daeveon Camilli    Date: 07/04/22    MRN: [id number]    Insurance Information:[employer]    Duration: Duration of face to face visit with patient: 30 minutes.    Contact Type: Brief face-to-face    Location: Offsite    Session Summary    Evidence or clinical intervention used:    Individual Drug Counseling:    Referral Source:  634/Psychiatry    Presenting concern:  SUD supports while inpatient/connecting to SUD treatment at discharge     Substance Use (Hx, Treatment, Current):    Last date of use:    Current Substance use Treatment? (MAT):    Medical (Reason for Hospitalization):     Mental Health (HX, Treatment, Current):    History or Current Suicidal Ideation or Homicidal Ideation?:    Current Legalities:    Family/Social/Leisure/Sober Supports (Self-Help):    Vocational/Housing:    Appropriate for G-9200/willingness?     Other treatment plan:  Writer met with the patient as requested by Psychiatry for possible SUD connection. Writer introduced themselves and SUD PC services. Writer and patient discussed outpatient treatment options and set a plan for the writer to explore the patients insurance and if he could attend [facility]. Patient was ultimately discharged before the writer could attempt to follow up. Writer will reach out to the patient in the outpatient setting.    Malgorzata Albert L. Peyton Spengler, MPA, CASAC  Sr. [facility]  [address]   [place], [place] [address]  [employer] [phone]  [phone]

## 2022-07-05 LAB — BLOOD CULTURE
Bacterial Blood Culture: 0
Bacterial Blood Culture: 0

## 2022-07-18 ENCOUNTER — Telehealth: Payer: Self-pay | Admitting: Psychiatry

## 2022-07-18 NOTE — Telephone Encounter (Signed)
Writer attempted to contact patient regarding outpatient SUD follow up. Patient did not answer. Writer did leave a VM with their contact information.

## 2022-07-26 ENCOUNTER — Encounter: Payer: Self-pay | Admitting: Psychiatry

## 2022-07-26 ENCOUNTER — Telehealth: Payer: Self-pay | Admitting: Psychiatry

## 2022-07-26 NOTE — Telephone Encounter (Signed)
Writer attempted to contact patient regarding outpatient SUD follow up after hospitalization. Patient did not answer. Writer did leave a VM with their contact information. Writer will send a letter.

## 2022-08-27 LAB — EKG 12-LEAD
P: -8 deg
P: 41 deg
PR: 148 ms
PR: 153 ms
QRS: 10 deg
QRS: 36 deg
QRSD: 89 ms
QRSD: 92 ms
QT: 318 ms
QT: 318 ms
QTc: 434 ms
QTc: 443 ms
Rate: 112 {beats}/min
Rate: 116 {beats}/min
T: 46 deg
T: 6 deg

## 2022-10-06 ENCOUNTER — Emergency Department
Admission: EM | Admit: 2022-10-06 | Discharge: 2022-10-07 | Disposition: A | Discharge status: Home / Self Care | Payer: Non-veteran care | Source: Ambulatory Visit | Attending: Psychiatry | Admitting: Psychiatry

## 2022-10-06 ENCOUNTER — Other Ambulatory Visit: Payer: Self-pay

## 2022-10-06 DIAGNOSIS — Z9151 Personal history of suicidal behavior: Secondary | ICD-10-CM | POA: Insufficient documentation

## 2022-10-06 DIAGNOSIS — Y906 Blood alcohol level of 120-199 mg/100 ml: Secondary | ICD-10-CM | POA: Insufficient documentation

## 2022-10-06 DIAGNOSIS — F17210 Nicotine dependence, cigarettes, uncomplicated: Secondary | ICD-10-CM | POA: Insufficient documentation

## 2022-10-06 DIAGNOSIS — Z781 Physical restraint status: Secondary | ICD-10-CM | POA: Insufficient documentation

## 2022-10-06 DIAGNOSIS — F10120 Alcohol abuse with intoxication, uncomplicated: Secondary | ICD-10-CM

## 2022-10-06 DIAGNOSIS — F10920 Alcohol use, unspecified with intoxication, uncomplicated: Secondary | ICD-10-CM

## 2022-10-06 DIAGNOSIS — R45851 Suicidal ideations: Secondary | ICD-10-CM

## 2022-10-06 DIAGNOSIS — F10929 Alcohol use, unspecified with intoxication, unspecified: Secondary | ICD-10-CM

## 2022-10-06 LAB — BASIC METABOLIC PANEL
Anion Gap: 19 — ABNORMAL HIGH (ref 7–16)
CO2: 16 mmol/L — ABNORMAL LOW (ref 20–28)
Calcium: 9.2 mg/dL (ref 9.0–10.3)
Chloride: 105 mmol/L (ref 96–108)
Creatinine: 0.88 mg/dL (ref 0.67–1.17)
Glucose: 116 mg/dL — ABNORMAL HIGH (ref 60–99)
Lab: 10 mg/dL (ref 6–20)
Potassium: 4.5 mmol/L (ref 3.3–5.1)
Sodium: 140 mmol/L (ref 133–145)
eGFR BY CREAT: 110 *

## 2022-10-06 LAB — CBC AND DIFFERENTIAL
Baso # K/uL: 0.1 10*3/uL (ref 0.0–0.2)
Eos # K/uL: 0.2 10*3/uL (ref 0.0–0.5)
Hematocrit: 47 % (ref 37–52)
Hemoglobin: 16.1 g/dL (ref 12.0–17.0)
IMM Granulocytes #: 0 10*3/uL (ref 0.0–0.0)
IMM Granulocytes: 0.4 %
Lymph # K/uL: 2.8 10*3/uL (ref 1.0–5.0)
MCV: 91 fL (ref 75–100)
Mono # K/uL: 0.8 10*3/uL (ref 0.1–1.0)
Neut # K/uL: 5.7 10*3/uL (ref 1.5–6.5)
Nucl RBC # K/uL: 0 10*3/uL (ref 0.0–0.0)
Nucl RBC %: 0 /100 WBC (ref 0.0–0.2)
Platelets: 272 10*3/uL (ref 150–450)
RBC: 5.2 MIL/uL (ref 4.0–6.0)
RDW: 12.3 % (ref 0.0–15.0)
Seg Neut %: 59.9 %
WBC: 9.6 10*3/uL (ref 3.5–11.0)

## 2022-10-06 LAB — DRUG SCREEN CHEMICAL DEPENDENCY, URINE
Amphetamine,UR: NEGATIVE
Benzodiazepinen,UR: POSITIVE
Cocaine/Metab,UR: NEGATIVE
Fentanyl, UR: NEGATIVE ng/mL
Opiates,UR: NEGATIVE
THC Metabolite,UR: NEGATIVE

## 2022-10-06 LAB — ACETAMINOPHEN LEVEL: Acetaminophen: <5 ug/mL

## 2022-10-06 LAB — SALICYLATE LEVEL: Salicylate: <3 mg/dL — ABNORMAL LOW (ref 15.0–30.0)

## 2022-10-06 LAB — HOLD GREEN WITH GEL

## 2022-10-06 LAB — ETHANOL: Ethanol: 172 mg/dL — ABNORMAL HIGH (ref 0–9)

## 2022-10-06 MED ORDER — MIDAZOLAM HCL 5 MG/ML IJ SOLUTION *WRAPPED*
INTRAMUSCULAR | Status: DC
Start: 2022-10-06 — End: 2022-10-06
  Administered 2022-10-06: 5 mg via INTRAMUSCULAR
  Filled 2022-10-06: qty 2

## 2022-10-06 MED ORDER — HALOPERIDOL LACTATE 5 MG/ML IJ SOLN *I*
INTRAMUSCULAR | Status: DC
Start: 2022-10-06 — End: 2022-10-06
  Administered 2022-10-06: 2 mg via INTRAMUSCULAR
  Filled 2022-10-06: qty 1

## 2022-10-06 MED ORDER — HALOPERIDOL LACTATE 5 MG/ML IJ SOLN *I*
2.0000 mg | Freq: Once | INTRAMUSCULAR | Status: AC
Start: 2022-10-06 — End: 2022-10-06

## 2022-10-06 MED ORDER — MIDAZOLAM HCL 5 MG/ML IJ SOLUTION *WRAPPED*
5.0000 mg | Freq: Once | INTRAMUSCULAR | Status: AC
Start: 2022-10-06 — End: 2022-10-06

## 2022-10-06 MED ORDER — LORAZEPAM 2 MG PO TABS *I*
2.0000 mg | ORAL_TABLET | Freq: Four times a day (QID) | ORAL | Status: DC | PRN
Start: 2022-10-06 — End: 2022-10-07

## 2022-10-06 NOTE — ED Notes (Signed)
10/06/22 1449   Expected Patient   Date of notification 10/06/22   Time Notified 1450   Notified by Office/PCP   ED Service Adult Call-in   Pt Info note/Reason for sending coming in custody. Pt is a 945 for SI. BAC at 1402 was .224   Expected Call-In Information   Temp 36.6 C (97.9 F)   Pulse 95   Resp 16   BP (!) 154/110

## 2022-10-06 NOTE — CPEP Notes (Signed)
CPEP Triage Note    Arrival    Patient is oriented to unit and CPEP evaluation process: Yes  Reviewed cell phone and visitor policies: yes   Reviewed contraband items/milieu safety concerns and confirmed no safety concerns present: yes    Patient is accompanied by: patient alone  Patient under MHT: Yes    History and Chief Complaint    Reason for current presentation: Jeffrey Reese is a 42 year old male with PPHX of bipolar disorder who presents via MHT for evaluation. Patient states he had a recent breakup that has him feeling down so he reached out to the [place] for someone to talk to and they suggested he come here. Patient denies SI/HI/AVH/SIB or any drug use but endorses alcohol use daily. Patient is tearful and does not understand why he was brought here stating I have attempted SI in the past, I am not suicidal just wanted to talk with someone.    [facility]    Any recent exposure to infestations(lice, scabies, bedbugs, fleas) or other communicable diseases: No    Substance use: alcohol     Ingestion: No    Self-harm: no    Medication Data Collection    Medication data collection: Yes: With Patient (Reliable)    Physical Assessment    Pain assessment: Last Nursing documented pain:  0-10 Scale: 0 (10/06/22 2058)    Last Filed Vitals    10/06/22 2058   BP: 125/74   Pulse:    Resp: 16   Temp: 37.3 C (99.1 F)   SpO2: 94%       Medical / Surgical History    PMH:   Past Medical History:   Diagnosis Date    Alcoholism     Bipolar disorder     Hypertension        PSH:   Past Surgical History:   Procedure Laterality Date    TONSILLECTOMY      TYMPANOPLASTY         Review of Systems    Any additional concerns: no    Anticipated track: 3    Caryn Section, RN, 9:31 PM

## 2022-10-06 NOTE — ED Triage Notes (Signed)
MHT. Jeffrey Reese to [facility] mental health clinic for SI. Reports drinking all day breath[facility]zer [facility].       Prehospital medications given: No

## 2022-10-06 NOTE — CPEP Notes (Signed)
CPEP CALL-IN    **Needs labs (BAL)    PCP/Service Referral: Dr. Esau Grew at [facility] [phone]    Patient Information Note: Patient with no pphx presents for SI secondary to alcohol intoxication. Not currently prescribed medication. Has appt with Psychiatrist Dr. Shade Flood on 4/19.      Call reported to:    Author Nelia Shi, RN as of 10/06/2022 at 2:21 PM

## 2022-10-06 NOTE — ED Notes (Signed)
Patient changed into green scrubs and socks. Patient placed in flex room and unwilling to give belonging bag to RN. DPS present as patient became agitated. Patient gave bag to RN but continued to raise voice and make threats at [facility] officers. Patient placed in restraints at this time due to concern for safety of others. RN present to maintain clinical oversight. Provider made aware.

## 2022-10-06 NOTE — CPEP Notes (Signed)
CPEP Charge Nurse Note    Report received from: Ambulance person, via EMS, under MHT accompanied by patient alone.    Reason for presentation SI    Pt with pphx of alcohol abuse, DT, MDD presents under MHT from [place] [facility] clinic for SI. Has been drinking all day. BAL 172 @ 1650. Restrained and medicated with versed & haldol in medical ED for aggressive behavior. Accepted by MD Clois Comber.    Chief Complaint   Patient presents with    Suicidal       Allergies as of 10/06/2022 - Up to Date 10/06/2022   Allergen Reaction Noted    Zoloft  05/10/2010       Past Medical History:   Diagnosis Date    Alcoholism     Bipolar disorder     Hypertension      Substance use: alcohol    Ingestion: No    Medical clearance: Yes: medically cleared: treatment recieved labs, medication, restraint    Safety Considerations none    Vital signs:  Last Filed Vitals    10/06/22 1656   BP: 168/78   Pulse:    Resp: 19   Temp: 36.9 C (98.4 F)   SpO2: 90%     Last Nursing documented pain:      Pre-Arrival Notifications: No    Rockwell Alexandria, RN, 8:59 PM

## 2022-10-06 NOTE — ED Provider Notes (Addendum)
History     Chief Complaint   Patient presents with    Suicidal     42 year old male with past medical history of bipolar disorder, alcohol use disorder, suicide attempts presenting to the emergency department for SI.    He went to the [place] mental health clinic earlier for suicidal ideation.  He reports that he was drinking all day.    Here in the emergency department he is aggressive, screaming.  Unable to obtain further history from him.      History provided by:  Patient  History limited by:  Psychiatric disorder  Language interpreter used: No          Medical/Surgical/Family History     Past Medical History:   Diagnosis Date    Alcoholism     Bipolar disorder     Hypertension         Patient Active Problem List   Diagnosis Code    Alcohol dependence with alcohol-induced mood disorder F10.24    HTN (hypertension) I10    Salicylate overdose T39.091A    Suicide attempt T76.91XA    Male Erectile Disorder F52.8    Drug overdose of undetermined intent, initial encounter T50.904A            Past Surgical History:   Procedure Laterality Date    TONSILLECTOMY      TYMPANOPLASTY            Social History     Tobacco Use    Smoking status: Every Day     Packs/day: .5     Types: Cigarettes   Substance Use Topics    Alcohol use: Yes     Comment: daily    Drug use: No             Review of Systems    Physical Exam     Triage Vitals  Triage Start: Start, (10/06/22 1508)  First Recorded BP: (!) 180/110, Resp: 20, Temp: 36.1 C (97 F) Oxygen Therapy SpO2: 98 %, O2 Device: None (Room air), Heart Rate: 90, (10/06/22 1509)  .      Physical Exam  Vitals and nursing note reviewed.   Constitutional:       General: He is in acute distress.      Appearance: Normal appearance.   Skin:     General: Skin is warm.   Neurological:      Mental Status: He is alert.   Psychiatric:         Mood and Affect: Affect is angry and inappropriate.         Thought Content: Thought content includes suicidal ideation.         Medical Decision Making      Assessment:  42 year old male with past medical history of bipolar disorder and prior suicide attempts presenting to the emergency department for suicidal ideation who is angry, aggressive, and getting involved with altercations with other patients and with staff.  Endorsed drinking all day.  Required sedation and restraints.  Will allow him to metabolize and reassess him when he is clinically sober and stable.  He will likely require admission to CPEP.     Differential diagnosis:  Intoxication, suicidal ideation    Plan:  Orders Placed This Encounter      Ethanol      Salicylate level      Acetaminophen level      Basic metabolic panel      CBC and differential  Restraints violent or self-destructive adult (age 47 and older)      haloperidol lactate (HALDOL) injection 2 mg      midazolam (VERSED) 5 mg/mL injection 5 mg    ED Course and Disposition:  Patient sober and able to talk to me. Broke up with someone and wanted to talk to someone. Went to [place] where he was endorsing SI and was sent here. He is telling me that he never had SI and wants to go home, but I spoke to mom who was very concerned about him and said that he was definitely endorsing SI. She did not feel comfortable with him coming home tonight given his history of SAs and his current emotional state. Admitted to CPEP.               Sallye Ober, MD      Resident Attestation:    Patient seen by me on 10/06/2022.  I saw and evaluated the patient. I agree with the resident's/fellow's findings and plan of care as documented above.    Author:  Tilden Dome, MD         Sallye Ober, MD  Resident  10/06/22 2300       Tilden Dome, MD  10/08/22 [phone]

## 2022-10-06 NOTE — CPEP Notes (Signed)
Sober @ 2330 per chart.

## 2022-10-06 NOTE — ED Notes (Signed)
Pt asleep, snoring respirations are audible. Removed from 4 point restraints without issue. Providers updated. PIV placed, labs sent.

## 2022-10-06 NOTE — Significant Event (Signed)
Face-to-face Evaluation of Patient Requiring Restraints    I have personally examined Jeffrey Reese and concur with the below assessment. Elements of this assessment include, but are not limited to the patient's behavior, history, drugs and medications, most recent lab results, vital signs, temperature, pain, and conditions that cause hypoxia.    Based on my evaluation, Jeffrey Reese requires continued application of restraints.            Patient's Restraint Orders for Past 24 Hours (24h ago through 24h from now)       Ordered     Start    10/06/22 1535  Restraints violent or self-destructive adult (age 42 and older)  [facility] ED restraint panel)  MAXIMUM X 4 HOURS        Start: 10/06/22 1536   End: 10/06/22 1935      References:    [facility] Restraint Policy    HH Restraint Policy    FFT Restraint Policy    FFT Policies   Question Answer Comment   Date of evaluation 10/06/2022    Time of evaluation 3:35 PM    Reason Danger to self    Reason Danger to others    Type [place] (four point and chest restraint)    Patient's reaction to restraints or seclusion No change    Medical condition Stable    Behavioral condition Serious    Need to continue restraints or seclusion Patient safety    Attending notified Yes        10/06/22 1536                        Sallye Ober, MD  10/06/2022 3:35 PM         Sallye Ober, MD  Resident  10/06/22 1535

## 2022-10-07 NOTE — CPEP Notes (Signed)
Patient with no complaints throughout the night. Patient denies any withdrawal symptoms at this time. Patient slept for the evening with little to no needs. No concerns at this time

## 2022-10-07 NOTE — ED Notes (Signed)
10/07/22 0309   CPEP Summary of Services   Safety precautions implemented personal belongings secured   Case consultation/discussion involving attending   Crisis intervention and safety planning involving verbal intervention;safety plan #1 developed (comment)   Collateral information obtained from family member(s)   Referral offered, accepted and completed for  Inpatient Chemical Dependency services   Referral(s) offered but declined by patient for No referrals declined

## 2022-10-07 NOTE — Discharge Instructions (Addendum)
CPEP Discharge Instructions    Discharge Date: 10/07/2022    Discharge Time:   3:05 am     Follow-ups:  Appointment With: Dr. Esau Grew  Phone: [phone]  Date: 10/09/2022  Time: Please call during business hours to schedule follow up after your discharge from [facility]     Chemical Dependency Resources     How can I tell if I have an alcohol problem?    Alcohol may be harmful for you if it causes a problem in any part of your life. Following are signs that you may have a drinking problem or are alcohol dependent.      Blacking out or forgetting where you were or what you were doing    Drinking to get drunk. Or, feeling like you need to drink more to get the same feeling or "buzz"    Drinking to decrease pain or stress    Drinking more than you had expected to drink    Drinking in a pattern. For example, every day or every week at the same time    Suffering from the effects of alcohol on your daily function    Drinking starts to take over and causes problems in your daily life, such as not showing up for work or driving when you are drunk    Trying to hide how much you drink   Feeling guilty or angry when someone says something about your drinking    Seeing or hearing things that are not there (hallucinating)    Having major personality changes when you drink    Planning activities around drinking    Experiencing seizures (convulsions)    Shaking of your hands if you have not had a drink for a while    Sleeping problems or bad dreams    Sweating, nervousness, confusion, or depression    Thinking a lot about drinking    Trouble having erections     What's the harm?    Not all drinking is harmful. You may have heard that regular light to moderate drinking (from  drink a day up to 1 drink a day for women and 2 for men) can even be good for the heart. With at-risk or heavy drinking, however, any potential benefits are outweighed by greater risks.    Injuries. Drinking too much increases your chances  of being injured or even killed.  Alcohol is a factor, for example, in about 60% of fatal burn injuries, drownings, and homicides; 50% of severe trauma injuries and sexual assaults; and 40% of fatal motor vehicle crashes, suicides, and fatal falls.    Health problems. Heavy drinkers have a greater risk of liver disease, heart disease, sleep disorders, depression, stroke, bleeding from the stomach, sexually transmitted infections from unsafe sex, and several types of cancer. They may also have problems managing diabetes, high blood pressure, and other conditions.    Birth defects. Drinking during pregnancy can cause brain damage and other serious problems in the baby. Because it is not yet known whether any amount of alcohol is safe for a developing baby, women who are pregnant or may become pregnant should not drink.    Alcohol use disorders. Generally known as alcoholism and alcohol abuse, alcohol use disorders are medical conditions that doctors can diagnose when a patient's drinking causes distress or harm. In the [place], about 18 million people have an alcohol use disorder.       Wellness Hints:    Be honest and open about  your alcohol use with your family, close friends, and health care professionals. Ask for and accept their help.  Avoid persons who use and/or abuse alcohol and who try to get you to drink alcohol.   Get the help of a counselor and keep regular appointments.   Eat a normal, well-balanced diet, drink six to eight glasses of water a day, and get plenty of rest.   Replace social activities associated with drinking alcohol with those that do not involve alcohol.   Consider cutting down on the amount of alcohol you drink and how often you drink it.     OPEN ACCESS CENTER  [address] [place] [address] [phone] OPEN 24 hours/ 7 days week (Spanish Speaking available)  (Se habla Espaol! Proveemos Asesoramiento y [employer]. Referidos para programas mdicos de  Detoxificacin, internado (inpatient) y programas Ambulatorios (outpatient). )  [address] [place] [address] [phone] OPEN 7AM to 10PM/ 7 days week (closed for major holidays)  *both locations serve [place], [place], [place], [place], [place], [place], [place], [place] and [place]  Providing help with opioid, alcohol and other substances. Assessments, evaluations and referrals to detox, inpatient and/or outpatient services.    MOBIL TREATMENT OPTIONS:  [facility] Treatment [facility]   Operated by [employer] of Alcoholism & Substance Abuse (GCASA)   *hours vary (p) [phone]   [employer] & [place] [facility] Mobile 24/7 Opiod Response Team   [facility] (FLACRA) (p) [phone]     DETOX FACILITIES:  [place]  [address] [place] [address] ([phone]   [facility])  [address] [place] [address] (6th floor) [place] [address] [phone]   FLACRA [facility] Alcohol Crisis Center   [address] [place] [address]  *Medicaid Managed Care and Private Ins. Only  (p) [phone]   [facility], Alcohol Only)  [address] [place] [address] [phone]   [facility] - Outpatient Only)  [address] [place] [address]  *Walk in Hours M-F 8:30AM  (p) [phone]   [facility] of [facility])  [address] [place] [address]  www.[email or web address] Sabrina Howland (intake)  (p) [phone] ext [phone]     [facility]   (Opiates only with Suboxone)  [address] [facility] [place] [address]  www.[email or web address] (p) [phone] or [phone]   [facility] Recovery  [address] [address]  www.[email or web address] (online referral available)  (p) [phone] (toll free)  (p) [phone]     INPATIENT PROGRAMS:  [facility]   [address] [place] [address] [phone]   CASA [address] -   [address] [place] [place] [address] ([phone] or   [phone] Ext [phone]     [facility]   [address] [place] [address] ([phone]   [facility]   [address] [place] [address] [address]) (p) [phone]   [facility]  [address] [place] [address] [phone]   [facility]  [address] [place] [address] [phone] option 3   [facility]  [address] [place] [address] [phone] option 4   [facility]  [address] [place] [address] [place] [address] ([phone]   Helio  Health - (2 Locations)   [facility]   [address] [place] [address]  *Walk in Hours 7 days a week 9am-11am    [facility]   [address] [place] [address] (p) [phone] ext. [phone] (self-referral line)        (p) [phone]   [facility])  [address] [place] [address] [phone]   [facility]  [address] [place] [address] (direct phone# [phone])  Application online: www.horizon-[email or web address] (p) [phone]    [facility]  [address] [place] [address] [phone]   [facility]  [address] [place] [phone]   [facility]  [address] [place] [address] [phone]   [facility]   [address] [place] [address] (located on [employer] Lynch campus) (p) [phone]   [facility]  [address] [place] [address] ([phone]     [facility]   [address] [place] [address] [phone]   [facility]ways of [facility])  [address], [place] [address]  [email or web address] [place]ie Hail (intake)  (p) [phone]     [facility]   [address] [place]  [address] ([phone]   [facility]  [address] [place]) [place] [address] [phone]   Holiday representative [facility])   [address] [place] [address] [phone] ext [address] [facility]  [address], [place] [address] [phone]   [facility] (2 Locations)   [facility]   [address] [place] [address]    [facility])   [address] [place] [address] [phone]      [phone]   [facility]   [address] [place] [address]  [phone]   [facility] Health Care Inpatient Chemical Dependency Treatment Center   [address] [place] [address] [phone]   [facility] Recovery  [address] [place] [address]  www.[email or web address] (online referral available) (p) [phone] (toll free)  (p) [phone]   [facility]   [address] [place] [address]  www.[email or web address] (p) [phone]  (f) [phone]   [facility] (No Medicaid/Medicare)   [address] [place] [address] [phone]     OUTPATIENT PROGRAMS:  [facility]  [address] [place] [address]  [email or web address].[email or web address]  *Walk in Hours Monday & Wednesday 8:30am-Noon and T &TH 3pm-5pm at   [address] ([phone]  Ext. [phone]   [facility] - Comprehensive Alcoholism  [address] [place] [address] [phone]   [place]  [address] [place] [address] [phone] Ext [address] (3  Clinics)   [address]  [place] [address]   [address] [place] [address]   [address]  [place] [address]  *Do not accept Medicare Ins. (p) [phone] ([place])  (p) [phone] ([place])  (p) [phone] [place])   [facility]  [address] [place] [address] [phone]   [facility]  and [employer] (Restart)  [address] [place] [address]  *Walk in hours Monday, Wednesday, Friday 1pm-2:30pm at N.[place]as [place] ([place]   [facility] Chemical Dependency  [address] [place] [address] [address] Spark [address]) (p) [phone]     [facility]  [address] [place] [address]  www.[email or web address] (p) [phone]   [facility] (Monday- Thursday)  [address] [place] [address]  www.delphirise.org  *Walk in hours Monday - Thursday 8:00am- 4:00pm (p) [phone]   [facility]   [address] [place]. [place] [address] ([phone]   [facility] on Alcohol & Substance Abuse (GCASA)  [address], [place], [place] [address]  [address] [place], [place] [address]  [address], [place], [place] [address] ([phone] ([address]  (p) [phone] Jerolyn Shin)  (p) [phone] Veva Holes)   Huther Doyle  [address] [place] [place] [address]  StubAgent.pl  *Walk in Hours Monday - Friday 8:30am, 10:00am, 12:30pm & 2:00pm (p) [phone]   Melissa Noon (Hispanic Outreach) - [facility] Restart  [address] [place] [address] [phone]   [facility])  [address] [place] [place] [address] [phone]   [facility]   [address] [place] 2 [place] [address] (direct phone [phone])  [address] [place] [address]  (direct phone [phone])  [address] [place] [address] Olander Kraeger) (direct phone [phone])  [address] [place] [address] [facility]) (direct phone [phone])  BuyingShow.es  *Walk in Hours Monday - Friday  8:00am - 3:00pm at all sites (p) [phone]  (indicate location when calling main intake line)   [facility] Pathways  [address] [place] [address]  www.[email or web address] (p) [phone]   [facility]   [address] [place] [address]  www.[email or web address].[email or web address]  *Walk in Hours Monday - Wednesday 8:00am - 9:30am    *Phone Intake Monday - Friday 8:00am - 4:00pm (p) [phone]   (f) [phone]   [facility]  [address]) [place] [address]  www.[email or web address] (p) [phone]     RESIDENTIAL/SUPPORTIVE LIVING FACILITES:  [facility] (Restart)  [address] [place] [address] [phone] Ext [phone]     [facility] (CARS)   [address] [place] [address] [phone]   [facility]   [address] [place] [address]  www.[email or web address] (p) [phone]     [facility] (2 Locations)           [facility]          [address] [place] [address]            [facility]          [address] [place] [address]  *Medicaid Managed Care and Private Ins. Only  (p) [phone] [employer])    (p) [phone] [facility])    [employer] - Supportive Living   Locations in [place], [place], [place], [place] [employer] [place] and [place]   *Apply through email address: [email or web address] No Number Listed  Use email address to apply    [employer]   [address] [place] [address] [phone]   [facility]   [address] [place] [address]  www.helio.health (  p) [phone]     [facility]  [address] [place] [address]  BuyingShow.es (p) [phone]       [facility]   [address] [place] [address] [phone]   [facility] Supportive Living   [address] [place] [address]  www.ywca.org (p) [phone]       [facility] ADMINISTRATION:  [facility]  [address] [place] [address] [phone]     [place]  [address] [place] [address] [phone]     [place]  [address] [place] [place] [address] [phone]     [place]  [address] [place] [address] [phone]   [facility]  [address] [place] [address]  *only offers  intake appointment if veteran is unable to be seen at another facility.  (p) [phone]   [facility]   [address] [place] [address] ([phone]     Resources for Drug and/or Alcohol Treatment:  https://www.barber.com/  https://ncadd-ra.org/    ADDITIONAL RESOURCES:  Al-Anon/Al-Teen (http://www.al-anon.alateen.org/)  [phone]   Alcohol Anonymous (AA)  (RentalRefinancing.at)   [place]. [phone]  [place]: [phone]  Phone Meetings: [phone]   Cocaine Anonymous (CA) (SatelliteStock.ch)  [phone]   Heroin Anonymous (HA) (http://www.heroinanonymous.org/)     Marijuana Anonymous (MA) (http://www.marijuana-anonymous,org/)  831-160-7717   Drug and Alcohol Council 8486770099   Life Line [phone] or [address] on Alcohol & Substance Abuse [place]) [phone]   Narcotics Anonymous [place] 24-hour Hotline  (http://rochesterny-na.org/)   (http://flana.net)    [place]: [phone]  [place]: [phone]   Nicotine Anonymous (NicA) (http://www.nicotine-anonymous.org/)  [phone]   [place] Office of Alcoholism & Substance Abuse Services (https://www.oasas.https://fisher-stokes.com/)  323-575-0023   Pathways Methadone Maintenance Program  [address] [place], [place] [address] [phone]  M-F 6am-3pm   Ref[facility] Recovery (http://www.refugerecovery.org/)  [email or web address]   ROCovery Fitness (http://www.rocoveryfitness.org/)   [address] [address] [place], [place] [address] [phone]  mail@rocoveryfitness .Lorraine Lax Recovery Methadone Maintenance Program  [address] [place] [address] [phone]            [place], [place] Chapter  Home  F.A.Q.  Links  Books  Welcome  Do you think that alcohol or drugs might be a problem for you? Even if you're not sure, we may be able to help.    SOS, informally known as "Save Our Rogelia Boga", is a non-religious support group for people who want to help each other live free from alcohol and drugs. We are an  alternative to self-help groups that emphasize religious or spiritual beliefs.    We currently have one in-person meeting, Thursday evenings at 7:30 PM [place] Time, and one [employer], Monday evenings at 8:00 PM. Please email [email or web address] -[email or web address] for further details.    If this is your first visit to our website, then you may want to start with our FAQ (Frequently Asked Questions) page or check out some books or helpful links.    We always welcome newcomers, and we hope to see you at a meeting soon!    Behavioral Health Access and Crisis Center (urgent care for adult mental health, walk-in):   Help with Mental Health and Substance Abuse Without a Trip to the Emergency Department  [facility] Health Access and Crisis Center is available for those who need help with substance abuse or mental health issue. The Center is an alternative  to a trip to the emergency department for urgent mental health needs.  We are pleased to welcome adults, aged 76 and over, without an appointment on a walk-in basis. You may call ahead to be accepted as soon as space is available. Our behavioral health services are completely voluntary, and patients are free to come and go as they please. You'll experience a comfortable, relaxed environment filled with clinicians who can assist you with problem-solving and safety planning.   No Appointment Needed  Adults 18 and over who need care may come to the Access and Crisis Center without an appointment on a walk-in basis. You may also call ahead to be accepted as soon as space is available. If there is a wait, we can give you an estimate of the current wait time before you can be seen.  Behavioral health services are completely voluntary with patients free to come and go as they please. You'll experience a more comfortable, relaxed environment than at the emergency department.  Our licensed clinicians can assist you with problem solving and safety planning,  and they can connect you to other programs and resources for a variety of ongoing mental health and substance use conditions.   What to Expect  We want you to be comfortable and prepared for your visit. Please call with any questions or concerns.   What to Bring  You are welcome to bring family members or friends with you for support. Please keep in mind that all visitors and patients must be 18 or older. You are welcome to come alone, as well.  If possible, please bring a copy of your insurance card and a photo ID.   When You Arrive  You'll sign in and get registered for services.  You will fill out some forms. Our staff can help with completing this paperwork if needed.  You'll be asked to sign a release form. This allows our clinicians to discuss your care with other members of your treatment team, such as the provider who referred you and/or your primary care physician.  All services provided by the Access and Crisis Center follow state and federal regulations regarding patient privacy and confidentiality.  Once You are Registered  You will meet with a licensed clinician who will evaluate your needs to determine the next step in your care. You'll discuss possible solutions for problem solving and treatment options that may meet your needs.  If needed, your clinician will refer you to another provider for further evaluation and care. Our goal is to help you easily transition to the provider who will be the best option for any additional treatment you may need.  The Access and Crisis Center may refer you to one of the many resources for care within the [facility] system. We also make referrals to services and programs in the community that will best meet your needs.  General Hours  Monday: 9:00 am - 9:00 pm   Tuesday: 9:00 am - 9:00 pm   Wednesday: 9:00 am - 9:00 pm   Thursday: 9:00 am - 9:00 pm   Friday: 9:00 am - 9:00 pm   Saturday: 9:00 am - 9:00 pm   Sunday: 9:00 am - 9:00 pm  Street Address  [address], Ground Floor  [place], [place] [address]           The clinical team recommends the following actions for creating a safe environment post discharge utilize options as identified in completed safety plan , reach  out to family and/or friends for support , remove any weapons or firearms from home, remove access to sharp objects, remove illicit substances and/or alcohol from home , take medications as prescribed , remove old medications no longer prescribed , and maintain access to a Narcan kit    Level of outreach indicated if patient fails new intake or COPS (Comprehensive Outpatient Psychiatric Service) appointment: Routine Program Follow-up    When to call for help:    Call your psychiatric outpatient provider if experiencing any of these symptoms: increased irritability, sleep changes, appetite changes, energy changes, thoughts to harm yourself or others, anxiety, fear, auditory or visual hallucinations.  Lifeline Helpline (24 hours/7 days) [phone] Consulting civil engineer)  Mobile Crisis team: [phone]    General Instructions:  Other written information given to the patient: No  Return to Work/School on: 10/07/2022    [facility] Safety Plan      Creation Date: 07/03/22 Last Update Date: 07/03/22      Step 1: Warning signs:    Warning Signs    arguments w/family      Step 2: Internal coping strategies - Things I can do to take my mind off my problems without contacting another person:    Strategies    go for a walk    pet the dog      Step 3: People and social settings that provide distraction:    Name Contact Information    [employer]    [facility]      Step 4: People whom I can ask for help during a crisis:    Name Contact Information    Lanxton Tumlinson (brother) [phone]      Step 5: Professionals or agencies I can contact during a crisis:    Clinician/Agency Name Phone Emergency Contact    [facility] emergency        Suicide Prevention Lifeline Phone: Call or Text 988  Crisis Text Line: Text  HOME to [phone]     Step 6: Making the environment safer (plan for lethal means safety):   Pt denies access to firearms  Pt states he uses pill box and willing to put away extra meds     Optional: What is most important to me and worth living for?:   Living by myself again and having independence     [place] Safety Plan. Jeanella Cara and Clyda Greener. Manson Passey. Used with permission of the authors.         Local and National agencies I can contact during a crisis:    [facility] CRISIS CALL CENTER: [phone]  [facility] MOBILE CRISIS: [phone]  NATIONAL SUICIDE PREVENTION LIFELINE: 68  POLICE: 911       The above information has been discussed with me and I have received a copy.  I understand that I am advised to follow the instructions given to me to appropriately care for my condition.

## 2022-10-07 NOTE — ED Notes (Signed)
10/07/22 0309   Disposition details   Patient is being discharged to community   Family notification done via phone   Name of family member notified Steward Drone - mother   Outpatient provider notification of disposition via phone message   Name of [facility] provider [facility]   Living situation: notification of disposition via phone   Transportation arranged other (comment)  (via uber)   Patient belongings  given to patient   Medications NA- patient has none   Valuables NA- patient has none

## 2022-10-07 NOTE — CPEP Notes (Signed)
Clinical Evaluator Note:      Name: Dr. Esau Grew, relationship: psychiatrist at the [facility]: [phone]    Writer left a voicemail requesting a return phone call.    Personal:     Name: Jeffrey Reese, relationship: mother: [phone]    Writer contacted patient's mother whom patient lives with. Steward Drone, patient's mother, reports that the patient has been drinking daily and his alcohol use has been worse this week after a break up with the patient's girlfriend.    Steward Drone does not note any significant changes in appetite, or sleep.      Steward Drone reports that today the patient was on the phone with the [employer] after drinking and was "yelling and shouting." Steward Drone reports that she was able to convince the patient to go to the [place] for further support. Steward Drone reports that the [place] encouraged the patient to come to [facility] because he was talking about "not wanting to go on." Steward Drone acknowledges the patient was intoxicated when making these statements and while interacting with [facility] staff.     Steward Drone reports that the patient needs treatment for his alcohol use but is "against anything that mentions God." Steward Drone reports that she was hopeful that [facility] would admit the patient to "make him stop drinking." Steward Drone provided with education and support around substance use treatment as well information on [facility] ([facility]).     Steward Drone is tearful when sharing that she does not know what to do when the patient is discharged with a corner store within walking distance where the patient can "buy beer." Writer reinforced that the patient will be given options for treatment he can utilize if needed.     Steward Drone reports that she wants "more information" about the patient's presentation and discussion with the psychiatrist. Writer reviews HIPAA and repeatedly reinforces the importance of it indicating the patient can choose to disclose further information to [place].     Steward Drone reports that the patient will go "right  back to drinking" and requests writer's first and last name. Writer provides it. Steward Drone made aware that the patient has chosen to [facility].

## 2022-10-07 NOTE — First Provider Contact (Signed)
CPEP Provider Triage and Referral Note     CPEP initial provider evaluation performed by   CPEP Provider Initial Contact       Date/Time Event User Comments    10/07/22 0228 CPEP Provider Initial Contact Noah Delaine --             Chief Complaint / HPI and Relevant Past History:     Chief Complaint   Patient presents with    Suicidal   .    Patient presented to CPEP on 10/06/2022  9:10 PM under MHT with the above chief complaint.  Pt is a 42 yo male with h/o depression,alcohol use disorder and withdrawal (h/o reported DTs on 12/16/18 but record review indicate hospitalization overnight for mild withdrawal issues),HTN;[facility] with [facility];MHT'd from [place] for SI in the context of alcohol intoxication.He breathlized at 0.224 at [facility] and BAL 172 in ED.He got agitated in ED and required restraints and IM medications (haldol and versid). He has been in good behavioral control in CPEP and CIWA 2 at 2137.Vital stable.    He has h/o priro psych admissions at age 17 (diagnosed with bipolar disorder at that time and on 39[address] 21/13/2011-06/13/2010 for trashing apartment and benadryl overdose and in context of intoxication and breakup). He has h/o multiple past overdoses in context of substance use and last 06/2022 and admitted to [facility] and cleared by psychiatry.    On interview, he is cooperative but frustrated and slightly irritated with questions.He reports that he reached out to [place] to talk to someone as he was advised to do in the past and frustrated that he was brought here.He acknowledges that he was under the influence but denies voicing any SI.He adamantly denies being suicidal though acknowledges the concerns of staff given h/o multiple attempts in the past.He reports break up with gf yesterday which led to being depressed.He admits to daily alcohol use of varying amounts and no recent sobriety.He denies drug use.He is not interested in detox/rehab services and would like to continue to work with providers at [facility] and has  appointments next week that he plans to keep.He denies AVH or paranoia and thoughts coherent and advocating for d/c.He lives with mother and able to identify protective factors including dog,mother,and family.    Home Medications:     Prior to Admission medications    Medication Sig Start Date End Date Taking? Authorizing Provider   amLODIPine (NORVASC) 10 mg tablet Take 1 tablet (10 mg total) by mouth daily 07/15/20  Yes [provider]   buPROPion (WELLBUTRIN XL) 150 mg 24 hr tablet Take 1 tablet (150 mg total) by mouth daily 10/11/21  Yes [provider]   gabapentin 100mg  capsule Take 6 capsules (600 mg total) by mouth 3 times daily 10/27/20  Yes [provider]   cyclobenzaprine (FLEXERIL) 10 mg tablet Take 1 tablet (10 mg total) by mouth 3 times daily as needed for Muscle spasms   Yes [provider]   DULoxetine (CYMBALTA) 60 mg DR capsule Take 1 capsule (60 mg total) by mouth daily   Yes [provider]   ALERT CONVERSION COMPLETE  Provider must reconcile medications 10/04/10   Provider, Conversion   epinephrine (EPIPEN 2-PAK) 0.3 MG/0.3ML DEVI USE AS DIRECTED. 12/03/09   Provider, Conversion       Vital Signs   Reviewed:  Last Filed Vitals    10/06/22 2058   BP: 125/74   Pulse:    Resp: 16   Temp: 37.3 C (  99.1 F)   SpO2: 94%        MSE   Mental Status Exam  Appearance: Unkempt  Relationship to Interviewer: Cooperative  Psychomotor Activity: Normal  Abnormal Movements: None  Station/Gait : Normal  Speech : Regular rate  Language: Normal comprehension  Mood: Dysphoric, Irritable  Affect: Dysphoric, Anxious, Labile  Thought Process: Logical  Thought Content: No suicidal ideation, No homicidal ideation  Perceptions/Associations : No hallucinations  Sensorium: Alert  Cognition: Fair attention span  Insight : Fair, Improving  Judgement: Fair, Improving      Labs:     Recent Results (from the past 24 hour(s))   Ethanol    Collection Time: 10/06/22  4:50 PM   Result Value  Ref Range    Ethanol 172 (H) 0 - 9 mg/dL   Salicylate level    Collection Time: 10/06/22  4:50 PM   Result Value Ref Range    Salicylate <3.0 (L) 15.0 - 30.0 mg/dL   Acetaminophen level    Collection Time: 10/06/22  4:50 PM   Result Value Ref Range    Acetaminophen <5 ug/mL   Basic metabolic panel    Collection Time: 10/06/22  4:50 PM   Result Value Ref Range    Glucose 116 (H) 60 - 99 mg/dL    Sodium 161 096 - 045 mmol/L    Potassium 4.5 3.3 - 5.1 mmol/L    Chloride 105 96 - 108 mmol/L    CO2 16 (L) 20 - 28 mmol/L    Anion Gap 19 (H) 7 - 16    UN 10 6 - 20 mg/dL    Creatinine 4.09 8.11 - 1.17 mg/dL    eGFR BY CREAT 914 *    Calcium 9.2 9.0 - 10.3 mg/dL   CBC and differential    Collection Time: 10/06/22  4:50 PM   Result Value Ref Range    WBC 9.6 3.5 - 11.0 THOU/uL    RBC 5.2 4.0 - 6.0 MIL/uL    Hemoglobin 16.1 12.0 - 17.0 g/dL    Hematocrit 47 37 - 52 %    MCV 91 75 - 100 fL    RDW 12.3 0.0 - 15.0 %    Platelets 272 150 - 450 THOU/uL    Seg Neut % 59.9 %    Neut # K/uL 5.7 1.5 - 6.5 THOU/uL    Lymph # K/uL 2.8 1.0 - 5.0 THOU/uL    Mono # K/uL 0.8 0.1 - 1.0 THOU/uL    Eos # K/uL 0.2 0.0 - 0.5 THOU/uL    Baso # K/uL 0.1 0.0 - 0.2 THOU/uL    Nucl RBC % 0.0 0.0 - 0.2 /100 WBC    Nucl RBC # K/uL 0.0 0.0 - 0.0 THOU/uL    IMM Granulocytes # 0.0 0.0 - 0.0 THOU/uL    IMM Granulocytes 0.4 %   Hold green with gel    Collection Time: 10/06/22  4:50 PM   Result Value Ref Range    Hold Green (w/gel,spun) HOLD TUBE    Drug screen chemical dependency, urine    Collection Time: 10/06/22  8:56 PM   Result Value Ref Range    Amphetamine,UR NEG     Cocaine/Metab,UR NEG     Benzodiazepinen,UR POS     Opiates,UR NEG     THC Metabolite,UR NEG     Fentanyl, UR NEG ng/mL    Remark,UR See text          Initial Diagnosis:  Final diagnoses:   Alcoholic intoxication without complication        Assessment of Scope of Emergency Services Required: No further CPEP evaluation or treatment necessary; discharge as Triage&Referral Visit.   CPEP  Plan:   MD/NP:  MD/NP to do: no action items at this time    RN:  RN to do: CIWA    Clinical evaluator:        Other Data Reviewed: Other:chart review    Collateral information: see separate note           Risk Screening:     Risk Screening Required for Discharge     Impulsivity & Violence:              [place] Suicide Severity Rating Scale - Screen:  CSSRS Brief (Past Month Screener)  1. Have you wished you were dead or wished you could go to sleep and not wake up? (Past 1 Month): No  2. Have you actually had any thoughts of killing yourself? (Past 1 Month): No  3. Have you been thinking about how you might do this? (Past 1 Month): No  4. Have you had these thoughts and had some intention of acting on them? (Past 1 Month): No  5. Have you started to work out or worked out the details of how to kill yourself? Do you intend to carry out this plan? (Past 1 Month): No  6. Have you ever done anything, started to do anything, or prepared to do anything to end your life? (Lifetime): Yes  6a. If YES, did you do any of these things in the past 3 months? (3 Months): Yes  Calculated C-SSRS Risk Score (Lifetime/Recent): High Risk        Discharge Plan:     Additional Mental Health Services required: NO: Additional Mental Health Services not required because: Currently enrolled in appropriate treatment.    Clint Bolder, MD, 10/07/2022, 2:38 AM     Clint Bolder, MD  10/07/22 [id number]

## 2022-10-11 IMAGING — DX DG CHEST 1V PORT
1 series · 2 of 2 positions shown · non-contrast
Comparison: 10/19/2020.

CLINICAL DATA: Respiratory failure

EXAM:
PORTABLE CHEST 1 VIEW

[Series 1: chest ap · 0.14mm/px · 2 of 2 slices shown]
[im 1/2]
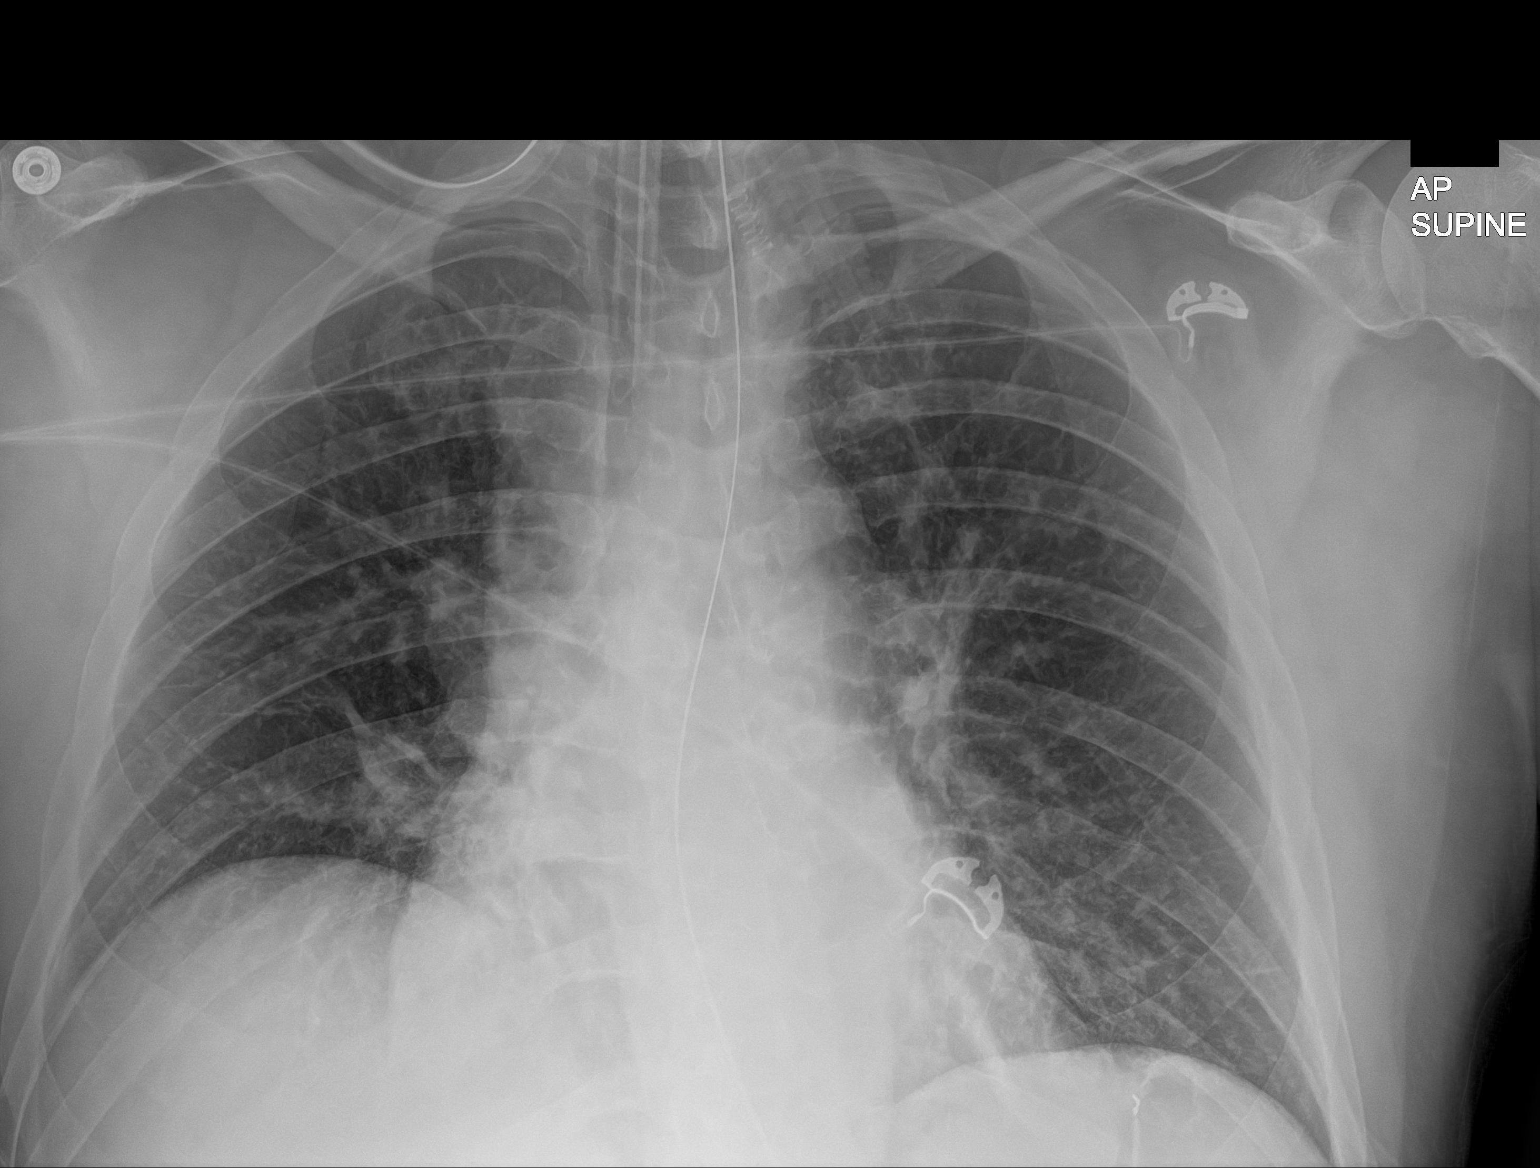
[im 2/2]
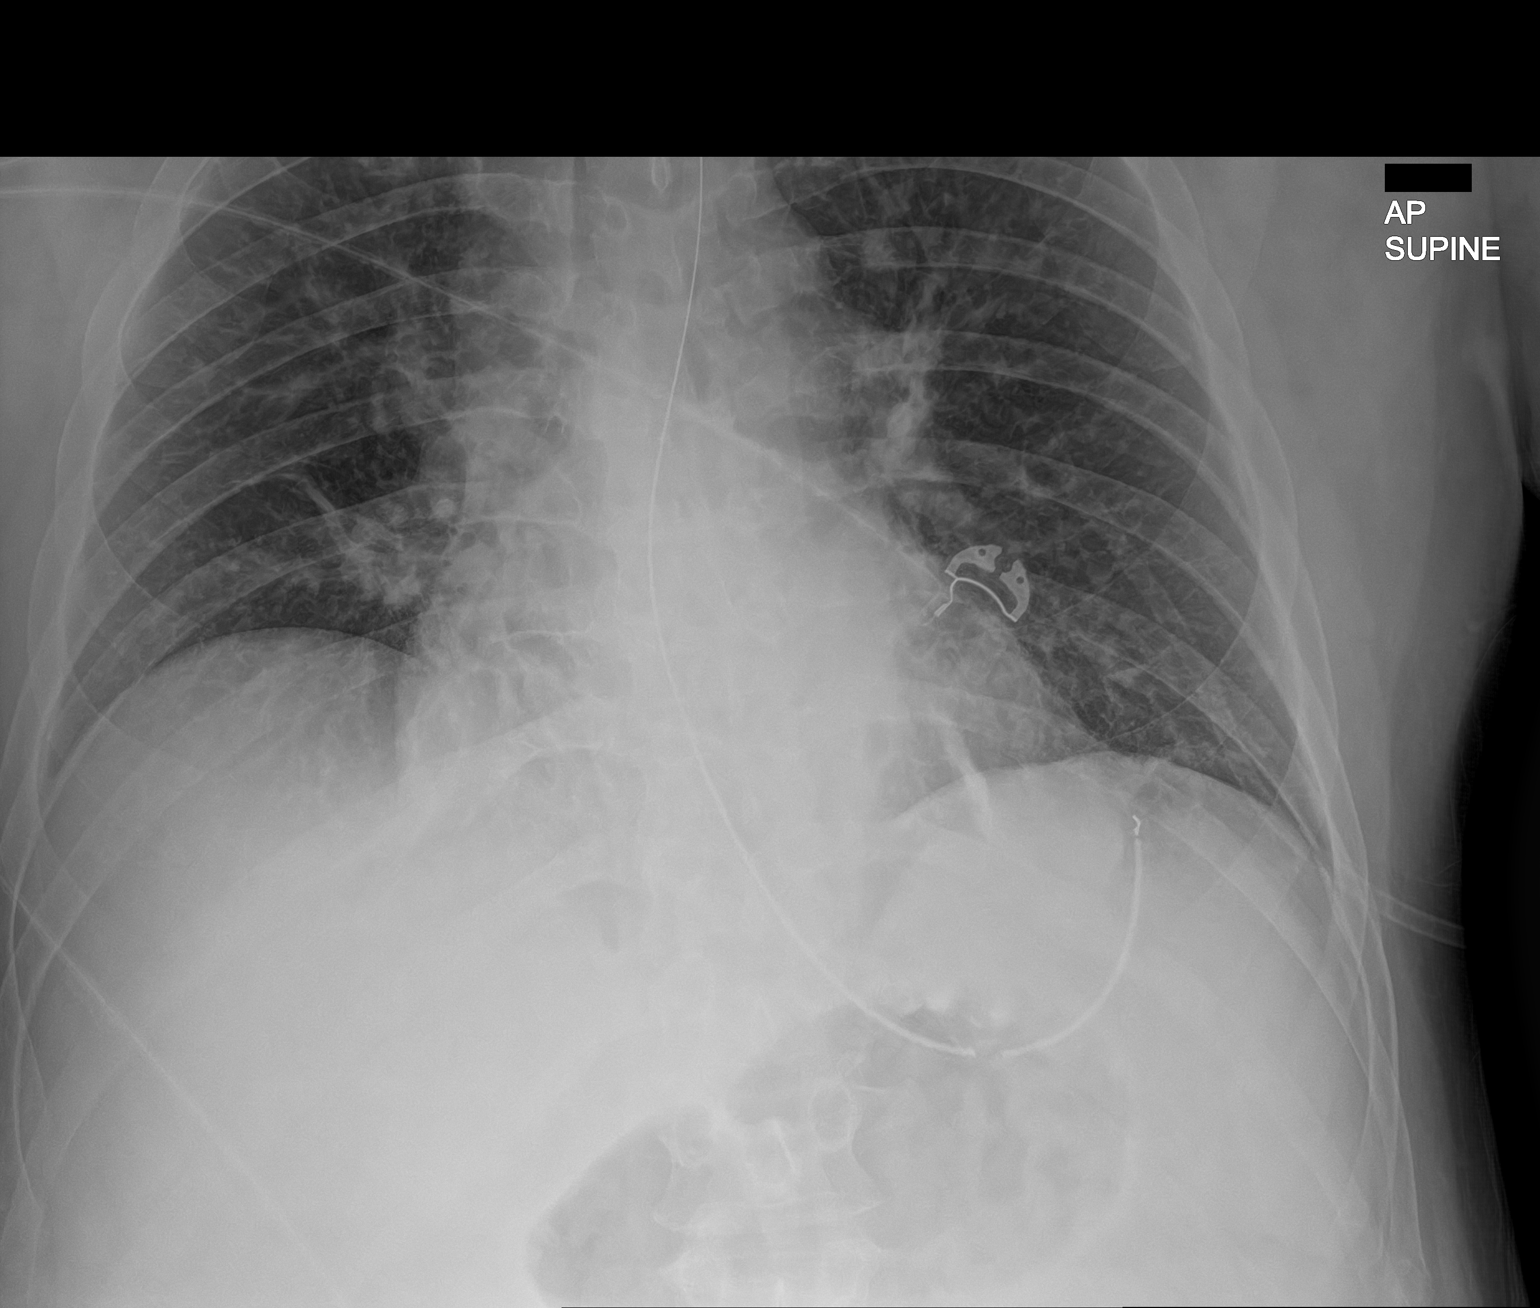

[2 of 2 positions shown; findings below may reference images not displayed]

FINDINGS: Endotracheal tube 5.2 cm above the carina. Nasogastric tube extends
into the upper abdomen. Pulmonary insufflation is improved. The
lungs are clear. No pneumothorax or pleural effusion. Cardiac size
within normal limits. Pulmonary vascularity is normal.
IMPRESSION: Stable support tubes.

Improved pulmonary insufflation. No focal pulmonary infiltrate. Are

## 2023-11-30 ENCOUNTER — Emergency Department

## 2023-11-30 ENCOUNTER — Emergency Department
Admission: EM | Admit: 2023-11-30 | Discharge: 2023-11-30 | Disposition: A | Discharge status: Home / Self Care | Source: Ambulatory Visit | Attending: Emergency Medicine | Admitting: Emergency Medicine

## 2023-11-30 DIAGNOSIS — Y998 Other external cause status: Secondary | ICD-10-CM | POA: Insufficient documentation

## 2023-11-30 DIAGNOSIS — S61311A Laceration without foreign body of left index finger with damage to nail, initial encounter: Secondary | ICD-10-CM

## 2023-11-30 DIAGNOSIS — Z23 Encounter for immunization: Secondary | ICD-10-CM | POA: Insufficient documentation

## 2023-11-30 DIAGNOSIS — S61210A Laceration without foreign body of right index finger without damage to nail, initial encounter: Secondary | ICD-10-CM | POA: Insufficient documentation

## 2023-11-30 DIAGNOSIS — M7989 Other specified soft tissue disorders: Secondary | ICD-10-CM

## 2023-11-30 DIAGNOSIS — Y9289 Other specified places as the place of occurrence of the external cause: Secondary | ICD-10-CM | POA: Insufficient documentation

## 2023-11-30 DIAGNOSIS — Y93G1 Activity, food preparation and clean up: Secondary | ICD-10-CM | POA: Insufficient documentation

## 2023-11-30 DIAGNOSIS — W268XXA Contact with other sharp object(s), not elsewhere classified, initial encounter: Secondary | ICD-10-CM | POA: Insufficient documentation

## 2023-11-30 DIAGNOSIS — I10 Essential (primary) hypertension: Secondary | ICD-10-CM

## 2023-11-30 MED ORDER — GELATIN ABSORBABLE 12-7 MM EX MISC *I*
1.0000 | Freq: Once | CUTANEOUS | Status: AC
Start: 2023-11-30 — End: 2023-11-30
  Administered 2023-11-30: 2 via CUTANEOUS
  Filled 2023-11-30: qty 1

## 2023-11-30 MED ORDER — TETANUS-DIPHTH-ACELL PERT 5-2.5-18.5 LF-MCG/0.5 IM SUSP *WRAPPED*
0.5000 mL | Freq: Once | INTRAMUSCULAR | Status: AC
Start: 2023-11-30 — End: 2023-11-30
  Administered 2023-11-30: 0.5 mL via INTRAMUSCULAR
  Filled 2023-11-30: qty 0.5

## 2023-11-30 MED ORDER — LIDOCAINE HCL (PF) 1 % IJ SOLN *I*
10.0000 mL | Freq: Once | INTRAMUSCULAR | Status: AC
Start: 2023-11-30 — End: 2023-11-30
  Administered 2023-11-30: 10 mL
  Filled 2023-11-30: qty 10

## 2023-11-30 NOTE — Discharge Instructions (Addendum)
 You were seen in the emergency for a wound to your right index finger  You had an avulsion injury to the finger  Gelfoam was applied- this acts as a scab- it will come off on its own- do not pick it off  You can change the gauzed daily    Close PCP follow up for wound check in the next 3-5 days    Tylenol  and motrin for pain at home    Return with uncontrollable bleeding, numbness/tingling, discoloration of the finger, fevers, purulent drainage, or other concerns     Xray: No acute fracture or dislocation. Joint spaces are preserved.     Linear radiodensity in the soft tissues medial to the index finger DIP joint, probable foreign body.     Soft tissue defect and edema of the distal index finger.       Blood pressure was high here- make sure this is rechecked out patient

## 2023-11-30 NOTE — ED Provider Notes (Signed)
 History     Chief Complaint   Patient presents with    Cut off part of the pad of his right index finger     81M with PMH of alcoholism, bipolar disorder, HTN, and other hx as noted below arrives to ED with finger injury.patient was using a mandolin to cut onions and he accidentally cut the tip of his right index finger. He reports normal ROM of the digit. He notes normal sensation. Unknown last tdap. No other concerns.       History provided by:  Patient  Language interpreter used: No          Medical/Surgical/Family History     Past Medical History:   Diagnosis Date    Alcoholism     Bipolar disorder     Hypertension         Patient Active Problem List   Diagnosis Code    Alcohol  dependence with alcohol -induced mood disorder F10.24    HTN (hypertension) I10    Salicylate overdose T39.091A    Suicide attempt T66.91XA    Male Erectile Disorder F52.8    Drug overdose of undetermined intent, initial encounter T50.904A            Past Surgical History:   Procedure Laterality Date    TONSILLECTOMY      TYMPANOPLASTY            Social History[1]          Review of Systems   Skin:  Positive for wound.   All other systems reviewed and are negative.      Physical Exam     Triage Vitals  Triage Start: Start, (11/30/23 1758)  First Recorded BP: (!) 193/108, Resp: 16, Temp: 37 C (98.6 F), Temp src: TEMPORAL Oxygen Therapy SpO2: 97 %, O2 Device: None (Room air), Heart Rate: 99, (11/30/23 1758)  .      Physical Exam  Vitals and nursing note reviewed.   Constitutional:       General: He is awake. He is not in acute distress.  HENT:      Head: Normocephalic and atraumatic.   Cardiovascular:      Rate and Rhythm: Normal rate and regular rhythm.      Pulses:           Radial pulses are 2+ on the right side.   Pulmonary:      Effort: Pulmonary effort is normal. No tachypnea or respiratory distress.   Musculoskeletal:      Right hand: Laceration and tenderness present. No swelling or deformity. Normal range of motion. Normal  capillary refill. Normal pulse.        Hands:       Comments: Avulsion injury to pad of right index finger (photo below)  -he denies injury to the nail- he bites his nails   Normal ROM to the digit  Normal sensation to digit   +2 pulses    Skin:     General: Skin is warm.      Capillary Refill: Capillary refill takes less than 2 seconds.      Findings: Laceration (to distal aspect of right index finger) present.   Neurological:      General: No focal deficit present.      Mental Status: He is alert, oriented to person, place, and time and easily aroused.      Sensory: Sensation is intact.      Motor: Motor function is intact.   Psychiatric:  Behavior: Behavior is cooperative.         Medical Decision Making              Katharyn Pall, NP              [1]   Social History  Tobacco Use    Smoking status: Every Day     Packs/day: .5     Types: Cigarettes   Substance Use Topics    Alcohol  use: Yes     Comment: daily    Drug use: No

## 2023-11-30 NOTE — ED Triage Notes (Signed)
 Pt is a 43 year old male who presents to the ED today by car from home for cutting off part of the pad of his right index finger.  Pt was using a mandolin knife when he accidentally cut himself.  Pt is uncertain on his last Tdap    Prehospital medications given: No

## 2023-11-30 NOTE — ED Procedure Documentation (Signed)
 Procedures   Laceration repair    Date/Time: 11/30/2023 5:57 PM    Performed by: Katharyn Pall, NP  Authorized by: Katharyn Pall, NP    Consent:     Consent obtained:  Verbal    Consent given by:  Patient    Risks discussed:  Infection, pain, need for additional repair, poor cosmetic result, nerve damage and poor wound healing    Alternatives discussed:  No treatment  Universal protocol:     Procedure explained and questions answered to patient or proxy's satisfaction: yes      Immediately prior to procedure, a time out was called: yes      Patient identity confirmed:  Verbally with patient, arm band and provided demographic data  Anesthesia:     Anesthesia method: block.  Laceration details:     Location:  Finger    Finger location:  R index finger    Wound length (cm): aulvion wound- ddistal aspect- 1cm.  Exploration:     Wound extent: no muscle damage noted and no tendon damage noted    Treatment:     Area cleansed with:  Chlorhexidine and saline    Amount of cleaning:  Extensive    Irrigation method:  Tap    Debridement:  None    Undermining:  None  Skin repair:     Repair method: gel foam used.  Repair type:     Repair type:  Simple  Post-procedure details:     Dressing:  Bulky dressing    Procedure completion:  Tolerated well, no immediate complications      Katharyn Pall, NP     Tricia Fuelling [place], NP  11/30/23 2124

## 2024-02-10 ENCOUNTER — Emergency Department
Admission: EM | Admit: 2024-02-10 | Discharge: 2024-02-10 | Disposition: A | Discharge status: Home / Self Care | Source: Ambulatory Visit | Attending: Emergency Medicine | Admitting: Emergency Medicine

## 2024-02-10 DIAGNOSIS — M79652 Pain in left thigh: Secondary | ICD-10-CM | POA: Insufficient documentation

## 2024-02-10 DIAGNOSIS — I10 Essential (primary) hypertension: Secondary | ICD-10-CM

## 2024-02-10 DIAGNOSIS — L539 Erythematous condition, unspecified: Secondary | ICD-10-CM | POA: Insufficient documentation

## 2024-02-10 DIAGNOSIS — N492 Inflammatory disorders of scrotum: Secondary | ICD-10-CM | POA: Insufficient documentation

## 2024-02-10 DIAGNOSIS — M79651 Pain in right thigh: Secondary | ICD-10-CM | POA: Insufficient documentation

## 2024-02-10 MED ORDER — DOXYCYCLINE HYCLATE 100 MG PO TABS *I*
100.0000 mg | ORAL_TABLET | Freq: Two times a day (BID) | ORAL | 0 refills | Status: AC
Start: 2024-02-10 — End: 2024-02-20

## 2024-02-10 MED ORDER — DOXYCYCLINE HYCLATE 100 MG PO TABS *I*
100.0000 mg | ORAL_TABLET | Freq: Once | ORAL | Status: AC
Start: 2024-02-10 — End: 2024-02-10
  Administered 2024-02-10: 100 mg via ORAL
  Filled 2024-02-10: qty 1

## 2024-02-10 MED ORDER — CLOTRIMAZOLE-BETAMETHASONE 1-0.05 % EX CREA *I*
TOPICAL_CREAM | Freq: Two times a day (BID) | CUTANEOUS | 0 refills | Status: AC
Start: 2024-02-10 — End: 2024-02-20

## 2024-02-10 NOTE — ED Provider Notes (Signed)
 History Chief Complaint Patient presents with  Abscess HPIHistory of Present IllnessThe patient presents for evaluation of a scrotal abscess.He reports an annual occurrence of infections, which he attributes to his work environment in a [employer] where he is exposed to excessive heat and sweat without the comfort of air conditioning. Approximately 3 weeks ago, he noticed a spot on his scrotum from which he could express fluid. He does not experience any pain associated with this condition. He has an appointment at the [facility] tomorrow morning for a routine checkup and plans to discuss this issue during that visit.SOCIAL HISTORYHe works in a [employer] fund[employer]. He admits to being a drinker.Medical/Surgical/Family History Past Medical History[1] Patient Active Problem List Diagnosis Code  Alcohol  dependence with alcohol -induced mood disorder F10.24  HTN (hypertension) I10  Salicylate overdose T39.091A  Suicide attempt T40.91XA  Male Erectile Disorder F52.8  Drug overdose of undetermined intent, initial encounter T50.904A  Past Surgical History[2] Social History[3]  Review of SystemsPhysical Exam Triage VitalsTriage Start: Start, (02/10/24 1201)  First Recorded BP: (!) 131/98, Resp: 18, Temp: 36.7 C (98.1 F), Temp src: Tympanic Oxygen Therapy SpO2: 96 %, Oximetry Source: Lt Hand, O2 Device: None (Room air), Heart Rate: 107, (02/10/24 1203) Heart Rate (via Pulse Ox): 107, (02/10/24 1203).Physical ExamVitals and nursing note reviewed. Constitutional:     General: He is not in acute distress.   Appearance: He is well-developed. He is not ill-appearing. HENT:    Head: Normocephalic and atraumatic.    Right Ear: External ear normal.    Left Ear: External ear normal.    Nose: Nose normal. Eyes:    Conjunctiva/sclera: Conjunctivae normal. Cardiovascular:    Rate and Rhythm: Normal rate.  Pulmonary:    Effort: Pulmonary effort is normal. Abdominal:    General: There is no distension. Genitourinary:   Comments: Area firm. Indurated. Fluctuant. Erythematous and painful.Area of the inner thighs is pruritic and erythematous rash.Musculoskeletal:       General: Normal range of motion.    Cervical back: Normal range of motion and neck supple. Skin:   General: Skin is warm and dry. Neurological:    Mental Status: He is alert and oriented to person, place, and time.  Medical Decision Making Assessment & PlanInitial Assessment: Recurrent infection due to work environment, presenting with a scrotal abscess.ED Course:- Needle aspiration performed, yielding approximately 10 cm of pus.- Area cleansed and pressure applied.Final Assessment: Needle aspiration performed for scrotal abscess, yielding pus. Advised on hygiene measures and prescribed medications.Clinical Impression:- Scrotal abscessDisposition:- Discharge: Home with instructions to keep the area clean and dry, and to use prescribed medications.Patient Education: Advised to use an astringent body wash during showers, applying it to the affected area for 2 to 3 minutes before rinsing. Instructed to keep the area clean and dry during work hours.PROCEDUREProcedure: Needle aspiration of scrotal abscess- Procedural?Discussion: Discussed performing needle aspiration instead of using a scalpel due to the sensitivity of the area. The benefits include less pain and lower risk compared to a scalpel. The patient agreed to the procedure.- Site Preparation: Cleaned the area with Betadine swab.- Technique: Performed needle aspiration using an 18-gauge needle. The needle was inserted into the abscess, and pus was drawn out.- Hemostasis: Pressure applied for hemostasis.- Dressing: Small adhesive bandage applied over the site.- Post-Procedural Discussion: Approximately  10 cm of pus was aspirated. The patient was advised to keep the area clean and dry. Antibiotic and Lotrisone  (antifungal and steroid) prescribed.ED Course and Disposition:  Patient will be placed on doxycycline  and  Lotrisone  topical cream for an antifungal.  Patient was given careful return instructions.  Needle aspiration was performed.  Showing about 10 cc of purulent material.  No involvement of the perineum. Glendia Reese [place], DO  [1] Past Medical History:Diagnosis Date  Alcoholism   Bipolar disorder   Hypertension  [2] Past Surgical History:Procedure Laterality Date  TONSILLECTOMY    TYMPANOPLASTY   [3] Social HistoryTobacco Use  Smoking status: Every Day   Packs/day: .5   Types: Cigarettes Substance Use Topics  Alcohol  use: Yes   Comment: daily  Drug use: No  Jeffrey Reese, DO08/17/25 1311

## 2024-02-10 NOTE — Discharge Instructions (Signed)
 -  Fill and take all medications as prescribed. If you have any questions about your medications, then please ask before leaving and signing your discharge paperwork. If you do have questions after leaving, then please call the emergency department and ask.  -If you were prescribed a controlled substance, then do not make important decisions or operate heavy machinery, such as a motor vehicle  -Call your primary care physician and schedule an appointment to be seen as soon as possible.   -The nature of the treatment in an emergency department can be limited because of many factors. If you have any unanswered questions, or do not feel comfortable with discharge and leaving the Emergency Department, then please say so.

## 2024-02-10 NOTE — ED Triage Notes (Signed)
 Patient notes an abscess that he has had now for 1 month. Area is painful and red.  Located  between scrotum/ rectal area.   Prehospital medications given: No

## 2024-04-04 ENCOUNTER — Encounter: Payer: Self-pay | Admitting: Internal Medicine

## 2024-05-19 ENCOUNTER — Ambulatory Visit: Payer: Self-pay | Admitting: Orthopedic Surgery

## 2024-06-04 ENCOUNTER — Encounter: Payer: Self-pay | Admitting: Orthopedic Surgery

## 2024-06-04 ENCOUNTER — Other Ambulatory Visit: Payer: Self-pay

## 2024-06-04 ENCOUNTER — Ambulatory Visit: Payer: Self-pay | Attending: Orthopedic Surgery | Admitting: Orthopedic Surgery

## 2024-06-04 ENCOUNTER — Ambulatory Visit: Admission: RE | Admit: 2024-06-04 | Discharge: 2024-06-04 | Disposition: A | Source: Ambulatory Visit

## 2024-06-04 VITALS — BP 158/92 | HR 97 | Ht 70.0 in | Wt 240.0 lb

## 2024-06-04 DIAGNOSIS — S62634A Displaced fracture of distal phalanx of right ring finger, initial encounter for closed fracture: Secondary | ICD-10-CM

## 2024-06-04 DIAGNOSIS — S62634D Displaced fracture of distal phalanx of right ring finger, subsequent encounter for fracture with routine healing: Secondary | ICD-10-CM

## 2024-06-04 DIAGNOSIS — M20011 Mallet finger of right finger(s): Secondary | ICD-10-CM

## 2024-06-04 DIAGNOSIS — M79644 Pain in right finger(s): Secondary | ICD-10-CM

## 2024-06-04 NOTE — Progress Notes (Signed)
 Hand and Upper Extremity SurgerySUBJECTIVE History of present Illness:Jeffrey Reese is a 43 y.o. male who presents for an initial evaluation of right ring finger pain.  Patient reports back in August he broke his right ring finger after punching something.  He went to the [place] and had radiographs taken demonstrating right ring finger distal phalanx fracture.  He reports not wearing any braces after his injury and since that time he has noticed a flexed posturing of his right DIP joint.  Denies pain, numbness, or tingling.  Denies chest pain, shortness of breath, headache, or blurry vision.OBJECTIVE Upper Extremities:Comprehensive exam of the Right upper extremity: Right hand is warm well-perfused.  Sensation intact to light touch throughout right ring finger.  Right ring finger mallet deformity noted.  No tenderness to palpation.  Unable to extend at the right ring finger DIP joint.   Imaging/ Studies:Right radiographs personally reviewed and interpreted.  Demonstrates distal phalanx fracture with bone callus formation and interval healing.ASSESSMENT The patient presents today with signs and symptoms consistent with:Right ring finger bony mallet. PLAN We reviewed the above diagnosis and had a thorough discussion regarding different treatment options.  We reviewed radiographs with patient and discussed the bone has started to fuse. As this is not painful to the patient and is not impacting his activities of daily living he would like to continue to monitor and not proceed with any surgical intervention.Patient's blood pressure and heart rate were elevated at today's visit however he is asymptomatic.  Advised patient to follow-up with PCP. Follow up: As neededThe patient is in full understanding with the above and agrees with the plan. All questions were answered.Asberry Lovett Heading, NP as of 8:47 AM 06/04/2024  This note has been dictated using  Dragon voice recognition software.I saw and evaluated the patient in the office today with Asberry Heading, NP, and agree with her note and findings. All physical examination measurements were either taken by myself and dictated or were confirmed by my own measurements. I personally discussed the treatment plans in detail with the patient as above. Valma Gubler, MD,PhD
